# Patient Record
Sex: Male | Born: 1935 | Race: White | Hispanic: No | Smoking: Former smoker
Health system: Southern US, Community
[De-identification: ages and names within clinical notes are randomized; demographics above are authoritative.]

## PROBLEM LIST (undated history)

## (undated) DIAGNOSIS — E669 Obesity, unspecified: Secondary | ICD-10-CM

## (undated) DIAGNOSIS — K5730 Diverticulosis of large intestine without perforation or abscess without bleeding: Secondary | ICD-10-CM

## (undated) DIAGNOSIS — M1990 Unspecified osteoarthritis, unspecified site: Secondary | ICD-10-CM

## (undated) DIAGNOSIS — I639 Cerebral infarction, unspecified: Secondary | ICD-10-CM

## (undated) DIAGNOSIS — Z87442 Personal history of urinary calculi: Secondary | ICD-10-CM

## (undated) DIAGNOSIS — I119 Hypertensive heart disease without heart failure: Secondary | ICD-10-CM

## (undated) DIAGNOSIS — I6521 Occlusion and stenosis of right carotid artery: Secondary | ICD-10-CM

## (undated) DIAGNOSIS — I714 Abdominal aortic aneurysm, without rupture, unspecified: Secondary | ICD-10-CM

## (undated) DIAGNOSIS — E785 Hyperlipidemia, unspecified: Secondary | ICD-10-CM

## (undated) DIAGNOSIS — E119 Type 2 diabetes mellitus without complications: Secondary | ICD-10-CM

## (undated) DIAGNOSIS — Z8679 Personal history of other diseases of the circulatory system: Secondary | ICD-10-CM

## (undated) DIAGNOSIS — I6523 Occlusion and stenosis of bilateral carotid arteries: Secondary | ICD-10-CM

## (undated) DIAGNOSIS — I10 Essential (primary) hypertension: Secondary | ICD-10-CM

## (undated) DIAGNOSIS — I739 Peripheral vascular disease, unspecified: Secondary | ICD-10-CM

## (undated) DIAGNOSIS — N400 Enlarged prostate without lower urinary tract symptoms: Secondary | ICD-10-CM

## (undated) DIAGNOSIS — J309 Allergic rhinitis, unspecified: Secondary | ICD-10-CM

## (undated) DIAGNOSIS — I6522 Occlusion and stenosis of left carotid artery: Secondary | ICD-10-CM

## (undated) DIAGNOSIS — N471 Phimosis: Secondary | ICD-10-CM

## (undated) DIAGNOSIS — Z974 Presence of external hearing-aid: Secondary | ICD-10-CM

## (undated) HISTORY — DX: Cerebral infarction, unspecified: I63.9

## (undated) HISTORY — PX: CATARACT EXTRACTION W/ INTRAOCULAR LENS IMPLANT: SHX1309

## (undated) HISTORY — DX: Personal history of urinary calculi: Z87.442

## (undated) HISTORY — DX: Unspecified osteoarthritis, unspecified site: M19.90

## (undated) HISTORY — PX: CARDIOVASCULAR STRESS TEST: SHX262

## (undated) HISTORY — PX: EYE SURGERY: SHX253

## (undated) HISTORY — DX: Hypertensive heart disease without heart failure: I11.9

## (undated) HISTORY — DX: Occlusion and stenosis of bilateral carotid arteries: I65.23

## (undated) HISTORY — PX: TOTAL KNEE ARTHROPLASTY: SHX125

## (undated) HISTORY — DX: Obesity, unspecified: E66.9

## (undated) HISTORY — PX: JOINT REPLACEMENT: SHX530

---

## 2003-02-06 ENCOUNTER — Ambulatory Visit (HOSPITAL_COMMUNITY): Admission: RE | Admit: 2003-02-06 | Discharge: 2003-02-06 | Payer: Self-pay | Admitting: Internal Medicine

## 2003-02-08 HISTORY — PX: EXTRACORPOREAL SHOCK WAVE LITHOTRIPSY: SHX1557

## 2003-10-11 ENCOUNTER — Emergency Department (HOSPITAL_COMMUNITY): Admission: EM | Admit: 2003-10-11 | Discharge: 2003-10-11 | Payer: Self-pay | Admitting: Emergency Medicine

## 2003-10-16 ENCOUNTER — Ambulatory Visit (HOSPITAL_COMMUNITY): Admission: RE | Admit: 2003-10-16 | Discharge: 2003-10-16 | Payer: Self-pay | Admitting: Urology

## 2003-10-16 ENCOUNTER — Ambulatory Visit (HOSPITAL_BASED_OUTPATIENT_CLINIC_OR_DEPARTMENT_OTHER): Admission: RE | Admit: 2003-10-16 | Discharge: 2003-10-16 | Payer: Self-pay | Admitting: Urology

## 2003-10-16 HISTORY — PX: OTHER SURGICAL HISTORY: SHX169

## 2003-10-20 ENCOUNTER — Ambulatory Visit (HOSPITAL_COMMUNITY): Admission: RE | Admit: 2003-10-20 | Discharge: 2003-10-20 | Payer: Self-pay | Admitting: Urology

## 2004-02-25 ENCOUNTER — Ambulatory Visit: Payer: Self-pay | Admitting: Internal Medicine

## 2004-03-03 ENCOUNTER — Ambulatory Visit: Payer: Self-pay | Admitting: Internal Medicine

## 2004-04-23 ENCOUNTER — Ambulatory Visit: Payer: Self-pay | Admitting: Internal Medicine

## 2005-03-21 ENCOUNTER — Ambulatory Visit: Payer: Self-pay | Admitting: Internal Medicine

## 2005-03-29 ENCOUNTER — Ambulatory Visit: Payer: Self-pay | Admitting: Internal Medicine

## 2005-08-04 IMAGING — CR DG ABDOMEN 1V
2 series · 2 of 2 positions shown · non-contrast
Comparison: Unenhanced CT abdomen and pelvis 10/11/03.

CLINICAL DATA: right urinary tract calculus; pre-lithotripsy
ABDOMEN- AP VIEW 10/20/03

[view not recorded (1 of 2)]
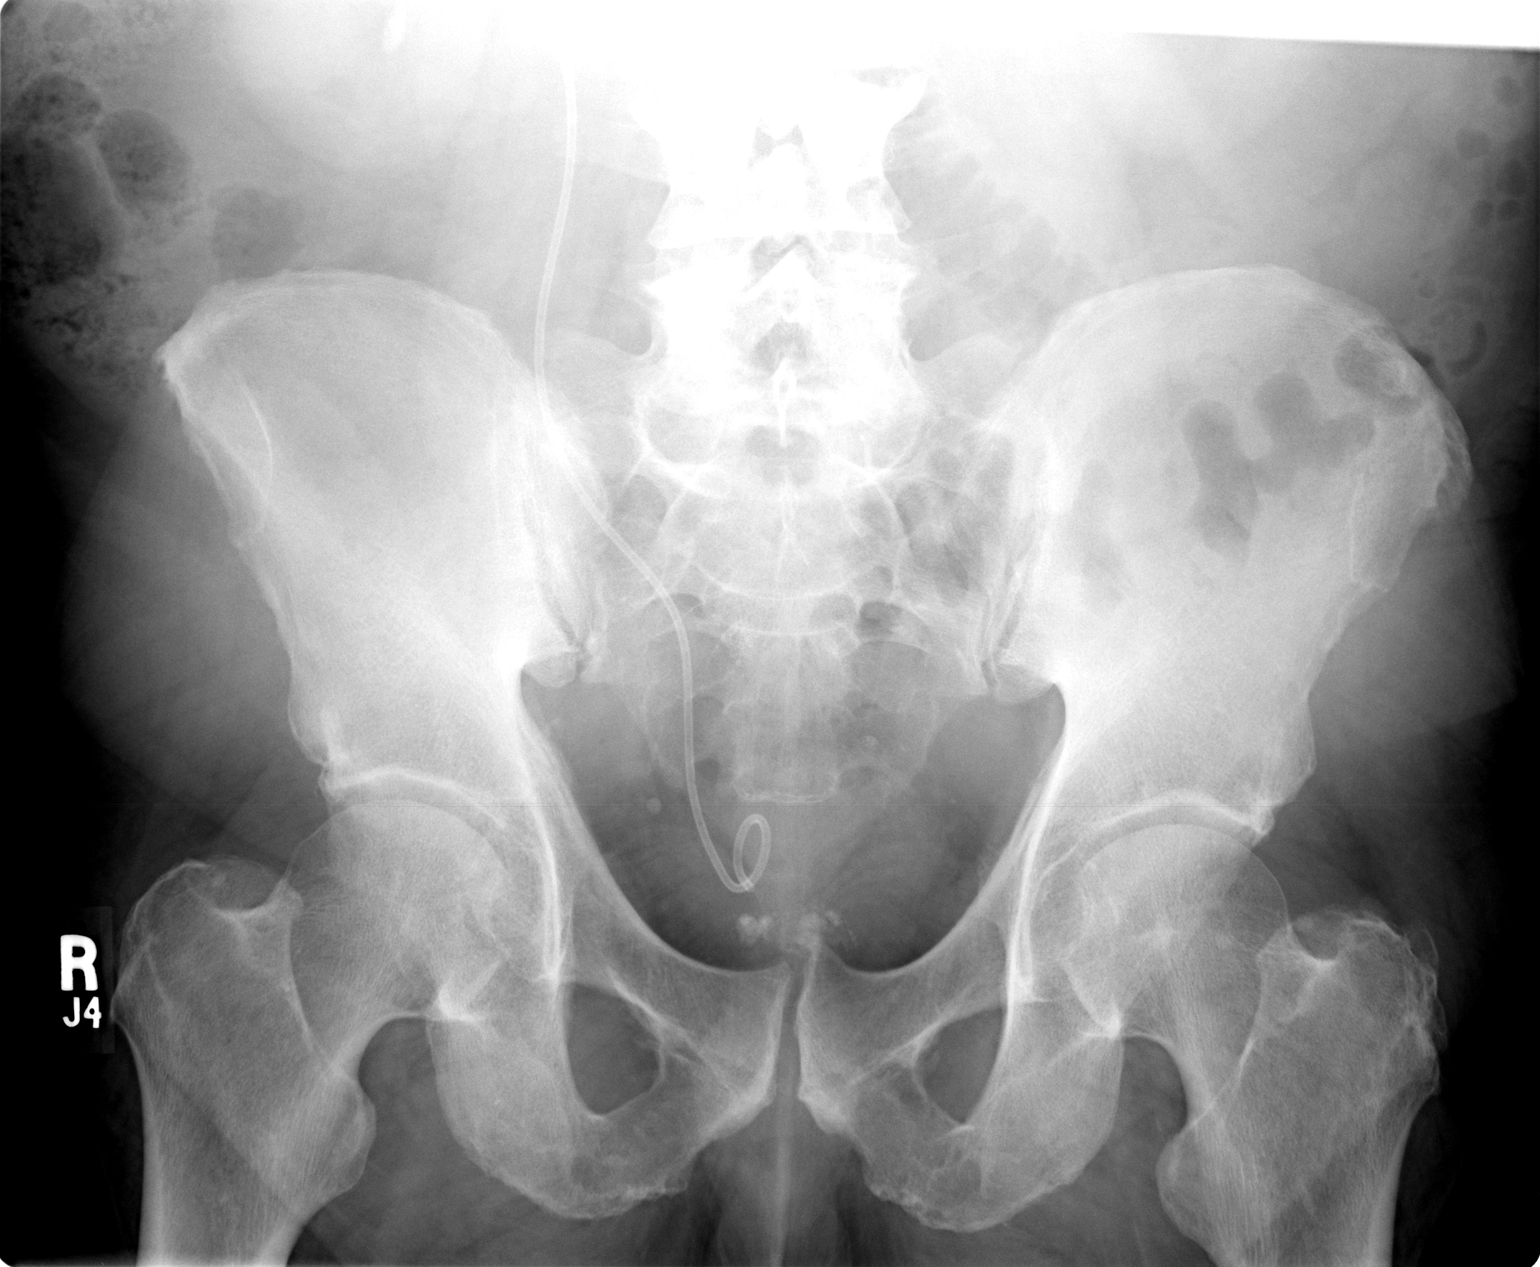

[view not recorded (2 of 2)]
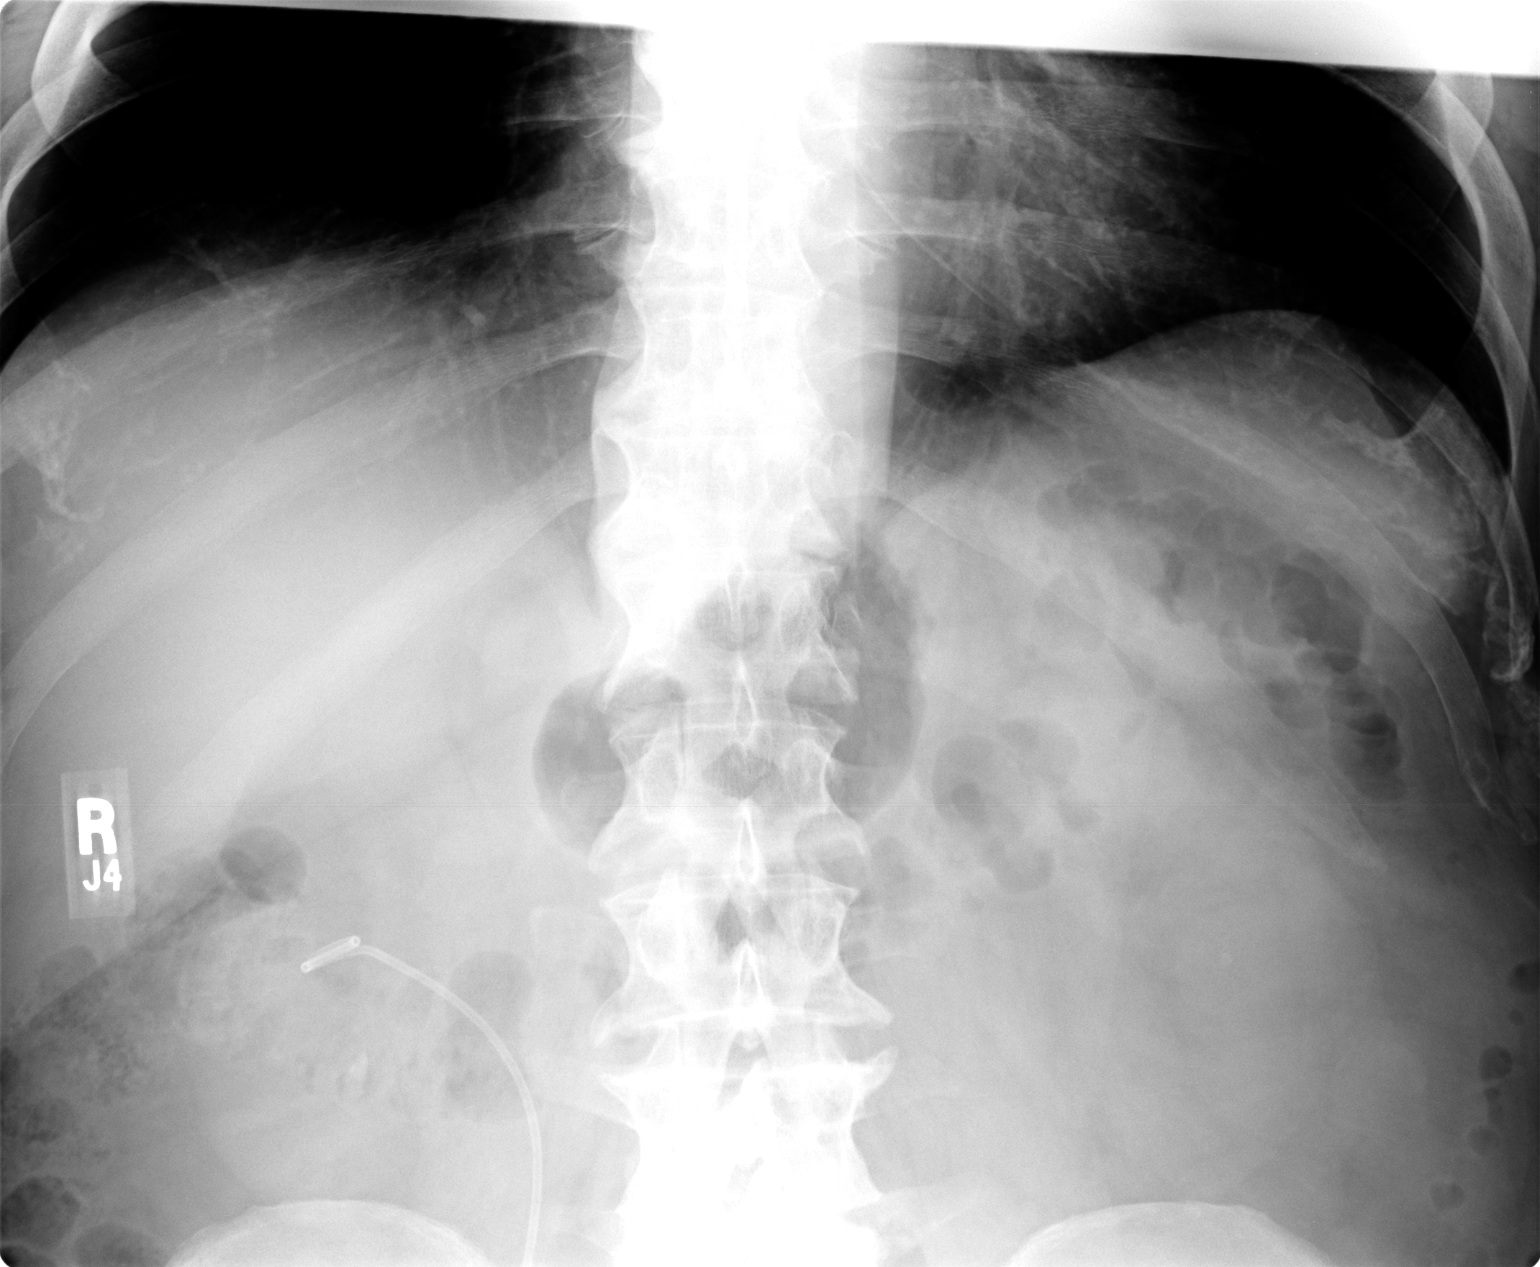

[2 of 2 positions shown; findings below may reference images not displayed]

FINDINGS: Since that examination, a right double-J ureteral stent has been placed.  Its proximal limb is in the expected location of a mid-calix of the right kidney and its distal limb is in the expected location of the bladder.  The UVJ calculus identified on the CT examination is now present in a lower pole calix of the right kidney.  No ureteral calculi are identified.  No prostatic calcifications are noted.  The bowel gas pattern is unremarkable.  
IMPRESSION
Right double-J ureteral stent in place.  The previously identified stone at the right UVJ is now present in a lower pole calix of the right kidney.

## 2006-07-17 ENCOUNTER — Ambulatory Visit: Payer: Self-pay | Admitting: Internal Medicine

## 2006-07-17 LAB — CONVERTED CEMR LAB
ALT: 28 units/L (ref 0–40)
Albumin: 4.2 g/dL (ref 3.5–5.2)
Alkaline Phosphatase: 52 units/L (ref 39–117)
BUN: 14 mg/dL (ref 6–23)
Bilirubin Urine: NEGATIVE
Bilirubin, Direct: 0.2 mg/dL (ref 0.0–0.3)
GFR calc Af Amer: 95 mL/min
HCT: 41.7 % (ref 39.0–52.0)
HDL: 47.2 mg/dL (ref >39.0)
Hemoglobin, Urine: NEGATIVE
Hgb A1c MFr Bld: 6.5 % — ABNORMAL HIGH (ref 4.6–6.0)
LDL Cholesterol: 93 mg/dL (ref 0–99)
Leukocytes, UA: NEGATIVE
MCHC: 35.4 g/dL (ref 30.0–36.0)
MCV: 92.7 fL (ref 78.0–100.0)
Microalb Creat Ratio: 5.4 mg/g (ref 0.0–30.0)
Microalb, Ur: 1.2 mg/dL (ref 0.0–1.9)
Monocytes Absolute: 0.3 10*3/uL (ref 0.2–0.7)
PSA: 1.38 ng/mL (ref 0.10–4.00)
Platelets: 143 10*3/uL — ABNORMAL LOW (ref 150–400)
RDW: 12.7 % (ref 11.5–14.6)
Sodium: 142 meq/L (ref 135–145)
Total Bilirubin: 1 mg/dL (ref 0.3–1.2)
Total CHOL/HDL Ratio: 3.3
Total Protein, Urine: NEGATIVE mg/dL
Total Protein: 7.1 g/dL (ref 6.0–8.3)
Urine Glucose: NEGATIVE mg/dL
Urobilinogen, UA: 0.2 (ref 0.0–1.0)
pH: 6 (ref 5.0–8.0)

## 2006-07-18 ENCOUNTER — Ambulatory Visit: Payer: Self-pay | Admitting: Vascular Surgery

## 2006-07-24 ENCOUNTER — Ambulatory Visit: Payer: Self-pay

## 2006-08-08 ENCOUNTER — Ambulatory Visit: Payer: Self-pay

## 2006-08-08 ENCOUNTER — Encounter: Payer: Self-pay | Admitting: Internal Medicine

## 2006-08-23 ENCOUNTER — Encounter: Admission: RE | Admit: 2006-08-23 | Discharge: 2006-08-23 | Payer: Self-pay | Admitting: Cardiology

## 2007-03-15 ENCOUNTER — Encounter: Payer: Self-pay | Admitting: Internal Medicine

## 2007-06-25 ENCOUNTER — Encounter: Payer: Self-pay | Admitting: Internal Medicine

## 2007-07-20 ENCOUNTER — Ambulatory Visit: Payer: Self-pay | Admitting: Internal Medicine

## 2007-07-20 DIAGNOSIS — K5730 Diverticulosis of large intestine without perforation or abscess without bleeding: Secondary | ICD-10-CM | POA: Insufficient documentation

## 2007-07-20 DIAGNOSIS — I739 Peripheral vascular disease, unspecified: Secondary | ICD-10-CM | POA: Insufficient documentation

## 2007-07-20 DIAGNOSIS — I119 Hypertensive heart disease without heart failure: Secondary | ICD-10-CM | POA: Insufficient documentation

## 2007-07-20 DIAGNOSIS — N400 Enlarged prostate without lower urinary tract symptoms: Secondary | ICD-10-CM | POA: Insufficient documentation

## 2007-07-20 DIAGNOSIS — E119 Type 2 diabetes mellitus without complications: Secondary | ICD-10-CM

## 2007-07-20 DIAGNOSIS — J309 Allergic rhinitis, unspecified: Secondary | ICD-10-CM | POA: Insufficient documentation

## 2007-07-20 DIAGNOSIS — F411 Generalized anxiety disorder: Secondary | ICD-10-CM | POA: Insufficient documentation

## 2007-07-20 DIAGNOSIS — F329 Major depressive disorder, single episode, unspecified: Secondary | ICD-10-CM | POA: Insufficient documentation

## 2007-07-20 DIAGNOSIS — M25569 Pain in unspecified knee: Secondary | ICD-10-CM | POA: Insufficient documentation

## 2007-07-20 DIAGNOSIS — Z87442 Personal history of urinary calculi: Secondary | ICD-10-CM | POA: Insufficient documentation

## 2007-07-20 DIAGNOSIS — F3289 Other specified depressive episodes: Secondary | ICD-10-CM | POA: Insufficient documentation

## 2007-07-20 DIAGNOSIS — E785 Hyperlipidemia, unspecified: Secondary | ICD-10-CM | POA: Insufficient documentation

## 2007-07-20 HISTORY — DX: Type 2 diabetes mellitus without complications: E11.9

## 2007-07-20 HISTORY — DX: Generalized anxiety disorder: F41.1

## 2007-09-20 ENCOUNTER — Encounter: Payer: Self-pay | Admitting: Internal Medicine

## 2007-09-26 ENCOUNTER — Encounter: Payer: Self-pay | Admitting: Internal Medicine

## 2007-09-26 ENCOUNTER — Telehealth (INDEPENDENT_AMBULATORY_CARE_PROVIDER_SITE_OTHER): Payer: Self-pay | Admitting: *Deleted

## 2007-10-08 ENCOUNTER — Inpatient Hospital Stay (HOSPITAL_COMMUNITY): Admission: RE | Admit: 2007-10-08 | Discharge: 2007-10-10 | Payer: Self-pay | Admitting: Orthopedic Surgery

## 2007-10-18 ENCOUNTER — Encounter: Payer: Self-pay | Admitting: Internal Medicine

## 2007-11-14 ENCOUNTER — Encounter: Payer: Self-pay | Admitting: Internal Medicine

## 2008-07-29 ENCOUNTER — Ambulatory Visit: Payer: Self-pay | Admitting: Vascular Surgery

## 2008-08-19 ENCOUNTER — Ambulatory Visit: Payer: Self-pay | Admitting: Internal Medicine

## 2008-08-20 LAB — CONVERTED CEMR LAB
Alkaline Phosphatase: 51 units/L (ref 39–117)
Basophils Relative: 0.6 % (ref 0.0–3.0)
Bilirubin Urine: NEGATIVE
Bilirubin, Direct: 0.2 mg/dL (ref 0.0–0.3)
Calcium: 9.3 mg/dL (ref 8.4–10.5)
Chloride: 105 meq/L (ref 96–112)
Cholesterol: 133 mg/dL (ref 0–200)
Glucose, Bld: 144 mg/dL — ABNORMAL HIGH (ref 70–99)
Hemoglobin, Urine: NEGATIVE
Leukocytes, UA: NEGATIVE
Lymphs Abs: 2 10*3/uL (ref 0.7–4.0)
PSA: 1.81 ng/mL (ref 0.10–4.00)
RDW: 13.4 % (ref 11.5–14.6)
Sodium: 143 meq/L (ref 135–145)
Specific Gravity, Urine: >=1.03 (ref 1.000–1.030)
Total CHOL/HDL Ratio: 3
Total Protein, Urine: NEGATIVE mg/dL
Total Protein: 7 g/dL (ref 6.0–8.3)
Triglycerides: 143 mg/dL (ref 0.0–149.0)
Urine Glucose: NEGATIVE mg/dL
Urobilinogen, UA: 0.2 (ref 0.0–1.0)
VLDL: 28.6 mg/dL (ref 0.0–40.0)
pH: 5.5 (ref 5.0–8.0)

## 2008-08-22 ENCOUNTER — Ambulatory Visit: Payer: Self-pay | Admitting: Internal Medicine

## 2008-08-22 DIAGNOSIS — R21 Rash and other nonspecific skin eruption: Secondary | ICD-10-CM | POA: Insufficient documentation

## 2008-09-09 ENCOUNTER — Encounter: Payer: Self-pay | Admitting: Internal Medicine

## 2008-09-15 ENCOUNTER — Encounter: Payer: Self-pay | Admitting: Internal Medicine

## 2008-09-30 ENCOUNTER — Inpatient Hospital Stay (HOSPITAL_COMMUNITY): Admission: RE | Admit: 2008-09-30 | Discharge: 2008-10-02 | Payer: Self-pay | Admitting: Orthopedic Surgery

## 2009-02-09 ENCOUNTER — Encounter: Payer: Self-pay | Admitting: Internal Medicine

## 2009-02-18 ENCOUNTER — Ambulatory Visit: Payer: Self-pay | Admitting: Internal Medicine

## 2009-02-23 ENCOUNTER — Ambulatory Visit: Payer: Self-pay | Admitting: Vascular Surgery

## 2009-06-15 ENCOUNTER — Encounter: Admission: RE | Admit: 2009-06-15 | Discharge: 2009-06-15 | Payer: Self-pay | Admitting: Internal Medicine

## 2009-06-17 ENCOUNTER — Ambulatory Visit: Payer: Self-pay | Admitting: Internal Medicine

## 2009-06-17 DIAGNOSIS — J019 Acute sinusitis, unspecified: Secondary | ICD-10-CM | POA: Insufficient documentation

## 2009-06-17 HISTORY — DX: Acute sinusitis, unspecified: J01.90

## 2009-06-23 ENCOUNTER — Encounter: Payer: Self-pay | Admitting: Internal Medicine

## 2009-08-20 ENCOUNTER — Telehealth: Payer: Self-pay | Admitting: Internal Medicine

## 2009-08-21 ENCOUNTER — Ambulatory Visit: Payer: Self-pay | Admitting: Internal Medicine

## 2009-08-21 LAB — CONVERTED CEMR LAB
ALT: 36 units/L (ref 0–53)
Albumin: 4.2 g/dL (ref 3.5–5.2)
Basophils Relative: 0.4 % (ref 0.0–3.0)
Bilirubin Urine: NEGATIVE
Cholesterol: 147 mg/dL (ref 0–200)
Creatinine,U: 117.2 mg/dL
Eosinophils Absolute: 0.3 10*3/uL (ref 0.0–0.7)
HCT: 41.7 % (ref 39.0–52.0)
Hemoglobin, Urine: NEGATIVE
Hemoglobin: 14.6 g/dL (ref 13.0–17.0)
Hgb A1c MFr Bld: 9.1 % — ABNORMAL HIGH (ref 4.6–6.5)
Leukocytes, UA: NEGATIVE
MCV: 94.4 fL (ref 78.0–100.0)
Microalb Creat Ratio: 0.3 mg/g (ref 0.0–30.0)
Monocytes Relative: 6.3 % (ref 3.0–12.0)
Neutro Abs: 3.8 10*3/uL (ref 1.4–7.7)
PSA: 1.62 ng/mL (ref 0.10–4.00)
RBC: 4.42 M/uL (ref 4.22–5.81)
TSH: 3.51 microintl units/mL (ref 0.35–5.50)
Total Bilirubin: 0.6 mg/dL (ref 0.3–1.2)
Total CHOL/HDL Ratio: 4
Triglycerides: 280 mg/dL — ABNORMAL HIGH (ref 0.0–149.0)
Urobilinogen, UA: 0.2 (ref 0.0–1.0)
WBC: 6.9 10*3/uL (ref 4.5–10.5)
pH: 6 (ref 5.0–8.0)

## 2009-08-24 ENCOUNTER — Ambulatory Visit: Payer: Self-pay | Admitting: Internal Medicine

## 2009-10-06 ENCOUNTER — Encounter: Payer: Self-pay | Admitting: Internal Medicine

## 2009-10-21 HISTORY — PX: TRANSTHORACIC ECHOCARDIOGRAM: SHX275

## 2009-10-26 ENCOUNTER — Telehealth: Payer: Self-pay | Admitting: Internal Medicine

## 2009-11-11 ENCOUNTER — Telehealth: Payer: Self-pay | Admitting: Internal Medicine

## 2010-01-08 ENCOUNTER — Telehealth: Payer: Self-pay | Admitting: Internal Medicine

## 2010-02-25 ENCOUNTER — Ambulatory Visit
Admission: RE | Admit: 2010-02-25 | Discharge: 2010-02-25 | Payer: Self-pay | Source: Home / Self Care | Attending: Internal Medicine | Admitting: Internal Medicine

## 2010-02-25 ENCOUNTER — Other Ambulatory Visit: Payer: Self-pay | Admitting: Internal Medicine

## 2010-02-25 LAB — LIPID PANEL
Cholesterol: 134 mg/dL (ref 0–200)
HDL: 37.7 mg/dL — ABNORMAL LOW (ref >39.00)
Total CHOL/HDL Ratio: 4
Triglycerides: 242 mg/dL — ABNORMAL HIGH (ref 0.0–149.0)
VLDL: 48.4 mg/dL — ABNORMAL HIGH (ref 0.0–40.0)

## 2010-02-25 LAB — LDL CHOLESTEROL, DIRECT: Direct LDL: 77.7 mg/dL

## 2010-02-25 LAB — BASIC METABOLIC PANEL
BUN: 22 mg/dL (ref 6–23)
CO2: 29 mEq/L (ref 19–32)
Calcium: 9 mg/dL (ref 8.4–10.5)
Chloride: 97 mEq/L (ref 96–112)
Creatinine, Ser: 1.1 mg/dL (ref 0.4–1.5)
GFR: 71.21 mL/min (ref >60.00)
Glucose, Bld: 216 mg/dL — ABNORMAL HIGH (ref 70–99)
Potassium: 4.6 mEq/L (ref 3.5–5.1)
Sodium: 135 mEq/L (ref 135–145)

## 2010-02-25 LAB — HEMOGLOBIN A1C: Hgb A1c MFr Bld: 8.3 % — ABNORMAL HIGH (ref 4.6–6.5)

## 2010-03-01 ENCOUNTER — Ambulatory Visit
Admission: RE | Admit: 2010-03-01 | Discharge: 2010-03-01 | Payer: Self-pay | Source: Home / Self Care | Attending: Internal Medicine | Admitting: Internal Medicine

## 2010-03-01 ENCOUNTER — Encounter: Payer: Self-pay | Admitting: Internal Medicine

## 2010-03-07 LAB — CONVERTED CEMR LAB
Albumin: 4.6 g/dL (ref 3.5–5.2)
BUN: 21 mg/dL (ref 6–23)
CO2: 26 meq/L (ref 19–32)
Creatinine, Ser: 1.1 mg/dL (ref 0.4–1.5)
Creatinine,U: 99 mg/dL
Direct LDL: 82.9 mg/dL
Eosinophils Absolute: 0.3 10*3/uL (ref 0.0–0.7)
GFR calc Af Amer: 85 mL/min
GFR calc non Af Amer: 70 mL/min
Glucose, Bld: 127 mg/dL — ABNORMAL HIGH (ref 70–99)
HDL: 38.6 mg/dL — ABNORMAL LOW (ref >39.0)
Hemoglobin, Urine: NEGATIVE
Hgb A1c MFr Bld: 6.9 % — ABNORMAL HIGH (ref 4.6–6.0)
Leukocytes, UA: NEGATIVE
Microalb, Ur: 0.2 mg/dL (ref 0.0–1.9)
Monocytes Absolute: 0.6 10*3/uL (ref 0.1–1.0)
Neutro Abs: 4.4 10*3/uL (ref 1.4–7.7)
Potassium: 4.2 meq/L (ref 3.5–5.1)
RBC: 4.46 M/uL (ref 4.22–5.81)
TSH: 1.88 microintl units/mL (ref 0.35–5.50)
Total CHOL/HDL Ratio: 3.8
Total Protein, Urine: NEGATIVE mg/dL
Total Protein: 7.5 g/dL (ref 6.0–8.3)
VLDL: 56 mg/dL — ABNORMAL HIGH (ref 0–40)

## 2010-03-09 ENCOUNTER — Ambulatory Visit: Admit: 2010-03-09 | Payer: Self-pay | Admitting: Vascular Surgery

## 2010-03-09 NOTE — Progress Notes (Signed)
  Phone Note Refill Request Message from:  Fax from Pharmacy on October 26, 2009 8:49 AM  Refills Requested: Medication #1:  FLUTICASONE PROPIONATE 50 MCG/ACT  SUSP 2 spray two times a day prn   Dosage confirmed as above?Dosage Confirmed   Last Refilled: 06/15/2009   Notes: [facility] Initial call taken by: Robin Ewing CMA (AAMA),  October 26, 2009 8:50 AM    Prescriptions: FLUTICASONE PROPIONATE 50 MCG/ACT  SUSP (FLUTICASONE PROPIONATE) 2 spray two times a day prn  #48 Gram x 3   Entered by:   Scharlene Gloss CMA (AAMA)   Authorized by:   Corwin Levins MD   Signed by:   Scharlene Gloss CMA (AAMA) on 10/26/2009   Method used:   Faxed to ...       [facility] (mail-order)             , [place]         Ph: [phone]       Fax: [phone]   RxID:   [id number]

## 2010-03-09 NOTE — Progress Notes (Signed)
Summary: metformin rx  Phone Note Call from Patient Call back at Home Phone [phone]   Caller: Patient Reason for Call: Refill Medication Summary of Call: Pt needs new rx for Metformin to be sent to [facility]-he doesn't remember what he did with the one given to him at his last OV(08/24/2009)-please advise Initial call taken by: Brenton Grills MA,  November 11, 2009 4:38 PM  Follow-up for Phone Call        to robin for routine Follow-up by: Corwin Levins MD,  November 11, 2009 5:45 PM    Prescriptions: METFORMIN HCL 500 MG  TABS (METFORMIN HCL) 1 by mouth two times a day  #180 x 3   Entered by:   Zella Ball Ewing CMA (AAMA)   Authorized by:   Corwin Levins MD   Signed by:   Scharlene Gloss CMA (AAMA) on 11/12/2009   Method used:   Faxed to ...       [facility] (mail-order)             , [place]         Ph: [phone]       Fax: [phone]   RxID:   [id number]

## 2010-03-09 NOTE — Assessment & Plan Note (Signed)
Summary: 6 MTH FU  $50--STC   Vital Signs:  Patient profile:   76 year old male Height:      71 inches Weight:      276 pounds BMI:     38.63 O2 Sat:      97 % on Room air Temp:     98.3 degrees F oral Pulse rate:   65 / minute BP sitting:   110 / 72  (left arm) Cuff size:   large  Vitals Entered ByMarland Kitchen Zella Ball Ewing (February 18, 2009 2:50 PM)  O2 Flow:  Room air  CC: 6 Mo ROV/RE   CC:  6 Mo ROV/RE.  History of Present Illness: wt up a few lbs with recent surgury to left knee and now pain free;  Pt denies CP, sob, doe, wheezing, orthopnea, pnd, worsening LE edema, palps, dizziness or syncope   Pt denies new neuro symptoms such as headache, facial or extremity weakness  Pt denies polydipsia, polyuria, or low sugar symptoms such as shakiness improved with eating.  Overall good compliance with meds, trying to follow low chol, DM diet, wt stable, little excercise however   Problems Prior to Update: 1)  Rash-nonvesicular  (ICD-782.1) 2)  Knee Pain, Right  (ICD-719.46) 3)  Preventive Health Care  (ICD-V70.0) 4)  Diverticulosis, Colon  (ICD-562.10) 5)  Depression  (ICD-311) 6)  Anxiety  (ICD-300.00) 7)  Nephrolithiasis, Hx of  (ICD-V13.01) 8)  Benign Prostatic Hypertrophy  (ICD-600.00) 9)  Hyperlipidemia  (ICD-272.4) 10)  Peripheral Vascular Disease  (ICD-443.9) 11)  Allergic Rhinitis  (ICD-477.9) 12)  Diabetes Mellitus, Type II  (ICD-250.00) 13)  Hypertension  (ICD-401.9)  Medications Prior to Update: 1)  Crestor 20 Mg Tabs (Rosuvastatin Calcium) .Marland Kitchen.. 1 By Mouth Once Daily 2)  Flomax 0.4 Mg Cp24 (Tamsulosin Hcl) .Marland Kitchen.. 1 By Mouth Once Daily 3)  Triamterene-Hctz 37.5-25 Mg  Tabs (Triamterene-Hctz) .Marland Kitchen.. 1 By Mouth Once Daily 4)  Cetirizine Hcl 10 Mg  Tabs (Cetirizine Hcl) .Marland Kitchen.. 1po Once Daily 5)  Carvedilol 12.5 Mg  Tabs (Carvedilol) .... 1/2 By Mouth Two Times A Day 6)  Glucosamine 1500 Complex   Caps (Glucosamine-Chondroit-Vit C-Mn) .... 4 By Mouth Qd 7)  Tylenol Ex St Arthritis  Pain 500 Mg  Tabs (Acetaminophen) .Marland Kitchen.. 1 By Mouth As Needed 8)  Darvocet-N 100 100-650 Mg  Tabs (Propoxyphene N-Apap) .Marland Kitchen.. 1 By Mouth Q 6 Hrs Prn 9)  Adult Aspirin Ec Low Strength 81 Mg  Tbec (Aspirin) .Marland Kitchen.. 1 By Mouth Once Daily 10)  Fluticasone Propionate 50 Mcg/act  Susp (Fluticasone Propionate) .... 2 Spray Two Times A Day Prn 11)  Metformin Hcl 500 Mg  Tabs (Metformin Hcl) .Marland Kitchen.. 1 By Mouth Once Daily 12)  Nystatin 100000 Unit/gm Crea (Nystatin) .... As Needed 13)  Lotrisone 1-0.05 % Crea (Clotrimazole-Betamethasone) .... Use Asd Two Times A Day As Needed 14)  Fluconazole 100 Mg Tabs (Fluconazole) .Marland Kitchen.. 1po Once Daily 15)  Benazepril Hcl 20 Mg Tabs (Benazepril Hcl) .Marland Kitchen.. 1po Once Daily 16)  Onetouch Ultra Test  Strp (Glucose Blood) .... Use Asd 1 Once Daily 17)  Viagra 100 Mg Tabs (Sildenafil Citrate) .Marland Kitchen.. 1 By Mouth Every Other Day As Needed  Current Medications (verified): 1)  Crestor 20 Mg Tabs (Rosuvastatin Calcium) .Marland Kitchen.. 1 By Mouth Once Daily 2)  Flomax 0.4 Mg Cp24 (Tamsulosin Hcl) .Marland Kitchen.. 1 By Mouth Once Daily 3)  Triamterene-Hctz 37.5-25 Mg  Tabs (Triamterene-Hctz) .Marland Kitchen.. 1 By Mouth Once Daily 4)  Cetirizine Hcl 10 Mg  Tabs (Cetirizine  Hcl) .... 1po Once Daily 5)  Carvedilol 12.5 Mg  Tabs (Carvedilol) .... 1/2 By Mouth Two Times A Day 6)  Glucosamine 1500 Complex   Caps (Glucosamine-Chondroit-Vit C-Mn) .... 4 By Mouth Qd 7)  Tylenol Ex St Arthritis Pain 500 Mg  Tabs (Acetaminophen) .Marland Kitchen.. 1 By Mouth As Needed 8)  Darvocet-N 100 100-650 Mg  Tabs (Propoxyphene N-Apap) .Marland Kitchen.. 1 By Mouth Q 6 Hrs Prn 9)  Adult Aspirin Ec Low Strength 81 Mg  Tbec (Aspirin) .Marland Kitchen.. 1 By Mouth Once Daily 10)  Fluticasone Propionate 50 Mcg/act  Susp (Fluticasone Propionate) .... 2 Spray Two Times A Day Prn 11)  Metformin Hcl 500 Mg  Tabs (Metformin Hcl) .Marland Kitchen.. 1 By Mouth Once Daily 12)  Nystatin 100000 Unit/gm Crea (Nystatin) .... As Needed 13)  Lotrisone 1-0.05 % Crea (Clotrimazole-Betamethasone) .... Use Asd Two Times A  Day As Needed 14)  Fluconazole 100 Mg Tabs (Fluconazole) .Marland Kitchen.. 1po Once Daily 15)  Benazepril Hcl 20 Mg Tabs (Benazepril Hcl) .Marland Kitchen.. 1po Once Daily 16)  Onetouch Ultra Test  Strp (Glucose Blood) .... Use Asd 1 Once Daily 17)  Viagra 100 Mg Tabs (Sildenafil Citrate) .Marland Kitchen.. 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   Head:  normocephalic and atraumatic.   Eyes:  vision grossly intact, pupils equal, and pupils round.   Ears:  R ear normal and L ear normal.   Nose:  no external deformity and no nasal discharge.   Mouth:  no gingival abnormalities and pharynx pink and moist.   Neck:  supple and no masses.   Lungs:  normal respiratory effort and normal breath sounds.   Heart:  normal rate and regular rhythm.   Extremities:  no edema, no erythema    Impression & Recommendations:  Problem # 1:  DIABETES MELLITUS, TYPE II (ICD-250.00)  His updated medication list for this problem includes:    Adult Aspirin Ec Low Strength 81 Mg Tbec (Aspirin) .Marland Kitchen... 1 by  mouth once daily    Metformin Hcl 500 Mg Tabs (Metformin hcl) .Marland Kitchen... 1 by mouth once daily    Benazepril Hcl 20 Mg Tabs (Benazepril hcl) .Marland Kitchen... 1po once daily  Labs Reviewed: Creat: 1.1 (08/19/2008)    Reviewed HgBA1c results: 6.5 (08/19/2008)  6.9 (07/20/2007) stable overall by hx and exam, ok to continue meds/tx as is, will need referral to DM clinic for diet and further wt loss  Orders: Diabetic Clinic Referral (Diabetic)  Problem # 2:  HYPERTENSION (ICD-401.9)  His updated medication list for this problem includes:    Triamterene-hctz 37.5-25 Mg Tabs (Triamterene-hctz) .Marland Kitchen... 1 by mouth once daily    Carvedilol 12.5 Mg  Tabs (Carvedilol) .Marland Kitchen... 1/2 by mouth two times a day    Benazepril Hcl 20 Mg Tabs (Benazepril hcl) .Marland Kitchen... 1po once daily  BP today: 110/72 Prior BP: 132/78 (08/22/2008)  Labs Reviewed: K+: 4.1 (08/19/2008) Creat: : 1.1 (08/19/2008)   Chol: 133 (08/19/2008)   HDL: 39.60 (08/19/2008)   LDL: 65 (08/19/2008)   TG: 143.0 (08/19/2008) stable overall by hx and exam, ok to continue meds/tx as is   Problem # 3:  HYPERLIPIDEMIA (ICD-272.4)  His updated medication list for this problem includes:    Crestor 20 Mg Tabs (Rosuvastatin calcium) .Marland Kitchen... 1 by mouth once daily  Labs Reviewed: SGOT: 35 (08/19/2008)   SGPT: 31 (08/19/2008)   HDL:39.60 (08/19/2008), 38.6 (07/20/2007)  LDL:65 (08/19/2008), DEL (16/11/9602)  Chol:133 (08/19/2008), 146 (07/20/2007)  Trig:143.0 (08/19/2008), 281 (07/20/2007) stable overall by hx and exam, ok to continue meds/tx as is , Pt to continue diet efforts, good med tolerance; to check labs - goal LDL less than 70   Complete Medication List: 1)  Crestor 20 Mg Tabs (Rosuvastatin calcium) .Marland Kitchen.. 1 by mouth once daily 2)  Flomax 0.4 Mg Cp24 (Tamsulosin hcl) .Marland Kitchen.. 1 by mouth once daily 3)  Triamterene-hctz 37.5-25 Mg Tabs (Triamterene-hctz) .Marland Kitchen.. 1 by mouth once daily 4)  Cetirizine Hcl 10 Mg Tabs (Cetirizine hcl) .Marland Kitchen.. 1po once daily 5)  Carvedilol 12.5  Mg Tabs (Carvedilol) .... 1/2 by mouth two times a day 6)  Glucosamine 1500 Complex Caps (Glucosamine-chondroit-vit c-mn) .... 4 by mouth qd 7)  Tylenol Ex St Arthritis Pain 500 Mg Tabs (Acetaminophen) .Marland Kitchen.. 1 by mouth as needed 8)  Darvocet-n 100 100-650 Mg Tabs (Propoxyphene n-apap) .Marland Kitchen.. 1 by mouth q 6 hrs prn 9)  Adult Aspirin Ec Low Strength 81 Mg Tbec (Aspirin) .Marland Kitchen.. 1 by mouth once daily 10)  Fluticasone Propionate 50 Mcg/act Susp (Fluticasone propionate) .... 2 spray two times a day prn 11)  Metformin Hcl 500 Mg Tabs (Metformin hcl) .Marland Kitchen.. 1 by mouth once daily 12)  Nystatin 100000 Unit/gm Crea (Nystatin) .... As needed 13)  Lotrisone 1-0.05 % Crea (Clotrimazole-betamethasone) .... Use asd two times a day as needed 14)  Fluconazole 100 Mg Tabs (Fluconazole) .Marland Kitchen.. 1po once daily 15)  Benazepril Hcl 20 Mg Tabs (Benazepril hcl) .Marland Kitchen.. 1po once daily 16)  Onetouch Ultra Test Strp (Glucose blood) .... Use asd 1 once daily 17)  Viagra 100 Mg Tabs (Sildenafil citrate) .Marland Kitchen.. 1 by mouth every other day as needed  Patient Instructions: 1)  Continue all previous medications as before this visit  2)  Please schedule a follow-up appointment in 6 months with CPX labs and: 3)  HbgA1C prior to visit, ICD-9: 250.02 4)  Urine Microalbumin prior to visit, ICD-9:   Immunization History:  Influenza Immunization History:    Influenza:  historical (12/08/2008)

## 2010-03-09 NOTE — Progress Notes (Signed)
  Phone Note Refill Request Message from:  Fax from Pharmacy on August 20, 2009 11:08 AM  Refills Requested: Medication #1:  FLOMAX 0.4 MG CP24 1 by mouth once daily   Dosage confirmed as above?Dosage Confirmed   Last Refilled: 08/22/2008   Notes: [facility] Initial call taken by: Robin Ewing CMA (AAMA),  August 20, 2009 11:08 AM    Prescriptions: FLOMAX 0.4 MG CP24 (TAMSULOSIN HCL) 1 by mouth once daily  #90 x 3   Entered by:   Scharlene Gloss CMA (AAMA)   Authorized by:   Corwin Levins MD   Signed by:   Scharlene Gloss CMA (AAMA) on 08/20/2009   Method used:   Faxed to ...       [facility] (mail[facility]             , [place]         Ph: [phone]       Fax: [phone]   RxID:   [id number]

## 2010-03-09 NOTE — Letter (Signed)
Summary: [facility]  [facility]   Imported By: Lanelle Bal 02/12/2009 13:54:15  _____________________________________________________________________  External Attachment:    Type:   Image     Comment:   External Document

## 2010-03-09 NOTE — Letter (Signed)
Summary: Darden Palmer MD  W Ashley Royalty MD   Imported By: Lester Bremer 10/16/2009 60:45:40  _____________________________________________________________________  External Attachment:    Type:   Image     Comment:   External Document

## 2010-03-09 NOTE — Progress Notes (Signed)
Summary: medication clarification  Phone Note From Pharmacy   Caller: [facility] Summary of Call: Pharmacy requesting to clarify if pts. Januvia dosage should be reduced since aging may reduce renal function . Please advise Initial call taken by: Robin Ewing CMA Duncan Dull),  January 08, 2010 8:36 AM  Follow-up for Phone Call        ok to cont as is for now; last GFR 75 Follow-up by: Corwin Levins MD,  January 08, 2010 9:32 AM  Additional Follow-up for Phone Call Additional follow up Details #1::        called [facility] informed of above information. Additional Follow-up by: Robin Ewing CMA Duncan Dull),  January 08, 2010 10:34 AM

## 2010-03-09 NOTE — Assessment & Plan Note (Signed)
Summary: YEARLY FU/ [employer] MEDICARE/ LABS PRIOR/ NWS  #   Vital Signs:  Patient profile:   76 year old male Height:      71 inches Weight:      275 pounds BMI:     38.49 O2 Sat:      96 % on Room air Temp:     98.7 degrees F oral Pulse rate:   57 / minute BP sitting:   112 / 78  (left arm) Cuff size:   large  Vitals Entered By: Zella Ball Ewing CMA Duncan Dull) (August 24, 2009 10:55 AM)  O2 Flow:  Room air  CC: Yearly followup/RE   CC:  Yearly followup/RE.  History of Present Illness: here to f/u;  cbg's at home in the 200's despite good med compliance, no wt or diet change per pt;  has been less active overall, did attend the [employer] class, but not monitoroing the sugars as much lately.  Did have left knee suguryg uag 2010 - stil trying to get back to being as active.  Pt denies CP, sob, doe, wheezing, orthopnea, pnd, worsening LE edema, palps, dizziness or syncope  Pt denies new neuro symptoms such as headache, facial or extremity weakness   Pt denies polydipsia, polyuria, or low sugar symptoms such as shakiness improved with eating.  Overall good compliance with meds, trying to follow low chol, DM diet, wt stable, little excercise however   No fever, wt loss, night sweats, loss of appetite or other constitutional symptoms   Problems Prior to Update: 1)  Sinusitis- Acute-nos  (ICD-461.9) 2)  Rash-nonvesicular  (ICD-782.1) 3)  Knee Pain, Right  (ICD-719.46) 4)  Preventive Health Care  (ICD-V70.0) 5)  Diverticulosis, Colon  (ICD-562.10) 6)  Depression  (ICD-311) 7)  Anxiety  (ICD-300.00) 8)  Nephrolithiasis, Hx of  (ICD-V13.01) 9)  Benign Prostatic Hypertrophy  (ICD-600.00) 10)  Hyperlipidemia  (ICD-272.4) 11)  Peripheral Vascular Disease  (ICD-443.9) 12)  Allergic Rhinitis  (ICD-477.9) 13)  Diabetes Mellitus, Type II  (ICD-250.00) 14)  Hypertension  (ICD-401.9)  Medications Prior to Update: 1)  Crestor 20 Mg Tabs (Rosuvastatin Calcium) .Marland Kitchen.. 1 By Mouth Once Daily 2)  Flomax 0.4 Mg Cp24  (Tamsulosin Hcl) .Marland Kitchen.. 1 By Mouth Once Daily 3)  Triamterene-Hctz 37.5-25 Mg  Tabs (Triamterene-Hctz) .Marland Kitchen.. 1 By Mouth Once Daily 4)  Cetirizine Hcl 10 Mg  Tabs (Cetirizine Hcl) .Marland Kitchen.. 1po Once Daily 5)  Carvedilol 12.5 Mg  Tabs (Carvedilol) .... 1/2 By Mouth Two Times A Day 6)  Glucosamine 1500 Complex   Caps (Glucosamine-Chondroit-Vit C-Mn) .... 4 By Mouth Qd 7)  Tylenol Ex St Arthritis Pain 500 Mg  Tabs (Acetaminophen) .Marland Kitchen.. 1 By Mouth As Needed 8)  Darvocet-N 100 100-650 Mg  Tabs (Propoxyphene N-Apap) .Marland Kitchen.. 1 By Mouth Q 6 Hrs Prn 9)  Adult Aspirin Ec Low Strength 81 Mg  Tbec (Aspirin) .Marland Kitchen.. 1 By Mouth Once Daily 10)  Fluticasone Propionate 50 Mcg/act  Susp (Fluticasone Propionate) .... 2 Spray Two Times A Day Prn 11)  Metformin Hcl 500 Mg  Tabs (Metformin Hcl) .Marland Kitchen.. 1 By Mouth Once Daily 12)  Nystatin 100000 Unit/gm Crea (Nystatin) .... As Needed 13)  Lotrisone 1-0.05 % Crea (Clotrimazole-Betamethasone) .... Use Asd Two Times A Day As Needed 14)  Fluconazole 100 Mg Tabs (Fluconazole) .Marland Kitchen.. 1po Once Daily 15)  Benazepril Hcl 20 Mg Tabs (Benazepril Hcl) .Marland Kitchen.. 1po Once Daily 16)  Onetouch Ultra Test  Strp (Glucose Blood) .... Use Asd 1 Once Daily 17)  Viagra  100 Mg Tabs (Sildenafil Citrate) .Marland Kitchen.. 1 By Mouth Every Other Day As Needed 18)  Azithromycin 250 Mg Tabs (Azithromycin) .... 2po Qd For 1 Day, Then 1po Qd For 4days, Then Stop  Current Medications (verified): 1)  Crestor 20 Mg Tabs (Rosuvastatin Calcium) .Marland Kitchen.. 1 By Mouth Once Daily 2)  Flomax 0.4 Mg Cp24 (Tamsulosin Hcl) .Marland Kitchen.. 1 By Mouth Once Daily 3)  Triamterene-Hctz 37.5-25 Mg  Tabs (Triamterene-Hctz) .Marland Kitchen.. 1 By Mouth Once Daily 4)  Cetirizine Hcl 10 Mg  Tabs (Cetirizine Hcl) .Marland Kitchen.. 1po Once Daily 5)  Carvedilol 12.5 Mg  Tabs (Carvedilol) .... 1/2 By Mouth Two Times A Day 6)  Glucosamine 1500 Complex   Caps (Glucosamine-Chondroit-Vit C-Mn) .... 4 By Mouth Qd 7)  Tylenol Ex St Arthritis Pain 500 Mg  Tabs (Acetaminophen) .Marland Kitchen.. 1 By Mouth As Needed 8)   Darvocet-N 100 100-650 Mg  Tabs (Propoxyphene N-Apap) .Marland Kitchen.. 1 By Mouth Q 6 Hrs Prn 9)  Adult Aspirin Ec Low Strength 81 Mg  Tbec (Aspirin) .Marland Kitchen.. 1 By Mouth Once Daily 10)  Fluticasone Propionate 50 Mcg/act  Susp (Fluticasone Propionate) .... 2 Spray Two Times A Day Prn 11)  Metformin Hcl 500 Mg  Tabs (Metformin Hcl) .Marland Kitchen.. 1 By Mouth Two Times A Day 12)  Nystatin 100000 Unit/gm Crea (Nystatin) .... As Needed 13)  Lotrisone 1-0.05 % Crea (Clotrimazole-Betamethasone) .... Use Asd Two Times A Day As Needed 14)  Fluconazole 100 Mg Tabs (Fluconazole) .Marland Kitchen.. 1po Once Daily 15)  Benazepril Hcl 20 Mg Tabs (Benazepril Hcl) .Marland Kitchen.. 1po Once Daily 16)  Onetouch Ultra Test  Strp (Glucose Blood) .... Use Asd 1 Once Daily 17)  Viagra 100 Mg Tabs (Sildenafil Citrate) .Marland Kitchen.. 1 By Mouth Every Other Day As Needed 18)  Azithromycin 250 Mg Tabs (Azithromycin) .... 2po Qd For 1 Day, Then 1po Qd For 4days, Then Stop 19)  Centrum Silver  Tabs (Multiple Vitamins-Minerals) .Marland Kitchen.. 1 By Mouth Once Daily 20)  Januvia 100 Mg Tabs (Sitagliptin Phosphate) .Marland Kitchen.. 1po Once Daily  Allergies (verified): No Known Drug Allergies  Past History:  Past Medical History: Last updated: 08/22/2008 Hypertension Diabetes mellitus, type II Allergic rhinitis Peripheral vascular disease - small AAA, and right ICA 100%  Hyperlipidemia Benign prostatic hypertrophy - dr Annabell Howells Nephrolithiasis, hx of hx of ETOH (remote) Anxiety Depression hx of right central retinal artery emboli DJD - hands, left shoulder, ankles Diverticulosis, colon Hypertensive Cardiomyopathy/ no CHF  Past Surgical History: Last updated: 02/18/2009 Denies surgical history a/p lithotripsy s/p bilat knee replacements - last was left knee aug 2010 - dr Charlann Boxer  Family History: Last updated: 07/20/2007 mother with CHF, COPD, renal failure HTN asthma depression  Social History: Last updated: 07/20/2007 Never Smoked 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         all otherwise negative per pt -    Physical Exam  General:  alert and overweight-appearing.   Head:  normocephalic and atraumatic.   Eyes:  vision grossly intact, pupils equal, and pupils round.   Ears:  R ear normal and L ear normal.   Nose:  no external deformity and no nasal discharge.   Mouth:  no gingival abnormalities and pharynx pink and moist.   Neck:  supple and no masses.   Lungs:  normal respiratory effort.   Heart:  normal rate and regular rhythm.   Abdomen:  soft, non-tender, and normal bowel sounds.   Msk:  no joint tenderness and no joint swelling.  , s/p bilat knee replacements with mild warmth but noeffusoin on the left Extremities:  tace ankle edema bilat Neurologic:  cranial nerves II-XII intact and strength normal in all extremities.  , has marked bilat hearing loss Skin:  color normal and no rashes.   Psych:  not anxious appearing and not depressed appearing.     Impression & Recommendations:  Problem # 1:  Preventive Health Care (ICD-V70.0)  Overall doing well, age appropriate education and counseling updated and referral for appropriate preventive services done unless declined, immunizations up to date or declined, diet counseling done if overweight, urged to quit smoking if smokes , most recent labs reviewed and current ordered if appropriate, ecg reviewed or declined (interpretation per ECG  scanned in the EMR if done); information regarding Medicare Prevention requirements given if appropriate; speciality referrals updated as appropriate   Orders: EKG w/ Interpretation (93000)  Problem # 2:  DIABETES MELLITUS, TYPE II (ICD-250.00)  His updated medication list for this problem includes:    Adult Aspirin Ec Low Strength 81 Mg Tbec (Aspirin) .Marland Kitchen... 1 by mouth once daily    Metformin Hcl 500 Mg Tabs (Metformin hcl) .Marland Kitchen... 1 by mouth two times a day    Benazepril Hcl 20 Mg Tabs (Benazepril hcl) .Marland Kitchen... 1po once daily    Januvia 100 Mg Tabs (Sitagliptin phosphate) .Marland Kitchen... 1po once daily uncontrolled - to increase the metformin to two times a day, and add januvia 100   Labs Reviewed: Creat: 1.1 (08/21/2009)    Reviewed HgBA1c results: 9.1 (08/21/2009)  6.5 (08/19/2008)  Complete Medication List: 1)  Crestor 20 Mg Tabs (Rosuvastatin calcium) .Marland Kitchen.. 1 by mouth once daily 2)  Flomax 0.4 Mg Cp24 (Tamsulosin hcl) .Marland Kitchen.. 1 by mouth once daily 3)  Triamterene-hctz 37.5-25 Mg Tabs (Triamterene-hctz) .Marland Kitchen.. 1 by mouth once daily 4)  Cetirizine Hcl 10 Mg Tabs (Cetirizine hcl) .Marland Kitchen.. 1po once daily 5)  Carvedilol 12.5 Mg Tabs (Carvedilol) .... 1/2 by mouth two times a day 6)  Glucosamine 1500 Complex Caps (Glucosamine-chondroit-vit c-mn) .... 4 by mouth qd 7)  Tylenol Ex St Arthritis Pain 500 Mg Tabs (Acetaminophen) .Marland Kitchen.. 1 by mouth as needed 8)  Darvocet-n 100 100-650 Mg Tabs (Propoxyphene n-apap) .Marland Kitchen.. 1 by mouth q 6 hrs prn 9)  Adult Aspirin Ec Low Strength 81 Mg Tbec (Aspirin) .Marland Kitchen.. 1 by mouth once daily 10)  Fluticasone Propionate 50 Mcg/act Susp (Fluticasone propionate) .... 2 spray two times a day prn 11)  Metformin Hcl 500 Mg Tabs (Metformin hcl) .Marland Kitchen.. 1 by mouth two times a day 12)  Nystatin 100000 Unit/gm Crea (Nystatin) .... As needed 13)  Lotrisone 1-0.05 % Crea (Clotrimazole-betamethasone) .... Use asd two times a day as needed 14)  Fluconazole 100 Mg Tabs (Fluconazole) .Marland Kitchen.. 1po once  daily 15)  Benazepril Hcl 20 Mg Tabs (Benazepril hcl) .Marland Kitchen.. 1po once daily 16)  Onetouch Ultra Test Strp (Glucose blood) .... Use asd 1 once daily 17)  Viagra 100 Mg Tabs (Sildenafil citrate) .Marland Kitchen.. 1 by mouth every other day as needed 18)  Azithromycin 250 Mg Tabs (Azithromycin) .... 2po qd for 1 day, then 1po  qd for 4days, then stop 19)  Centrum Silver Tabs (Multiple vitamins-minerals) .Marland Kitchen.. 1 by mouth once daily 20)  Januvia 100 Mg Tabs (Sitagliptin phosphate) .Marland Kitchen.. 1po once daily  Patient Instructions: 1)  increase the metformin to two times a day  2)  start the januvia 100 mg per day 3)  Continue all previous medications as before this visit  4)  Please schedule a follow-up appointment in 6 months with : 5)  BMP prior to visit, ICD-9: 250.02 6)  Lipid Panel prior to visit, ICD-9: 7)  HbgA1C prior to visit, ICD-9: Prescriptions: METFORMIN HCL 500 MG  TABS (METFORMIN HCL) 1 by mouth two times a day  #180 x 3   Entered and Authorized by:   Corwin Levins MD   Signed by:   Corwin Levins MD on 08/24/2009   Method used:   Print then Give to Patient   RxID:   [id number] JANUVIA 100 MG TABS (SITAGLIPTIN PHOSPHATE) 1po once daily  #30 x 11   Entered and Authorized by:   Corwin Levins MD   Signed by:   Corwin Levins MD on 08/24/2009   Method used:   Print then Give to Patient   RxID:   [id number]

## 2010-03-09 NOTE — Assessment & Plan Note (Signed)
Summary: SINUS/NWS   Vital Signs:  Patient profile:   76 year old male Height:      71 inches Weight:      276 pounds BMI:     38.63 O2 Sat:      95 % on Room air Temp:     98.1 degrees F oral Pulse rate:   56 / minute BP sitting:   112 / 78  (left arm) Cuff size:   large  Vitals Entered By: Bill Salinas CMA (Jun 17, 2009 11:40 AM)  O2 Flow:  Room air  CC: office visit for evaluation of sinus'/ ab   CC:  office visit for evaluation of sinus'/ ab.  History of Present Illness: here wtih 3 wks ongoing and now moderate to severe facial pain, pressure, fever , and greenish d/c ;  Pt denies CP, sob, doe, wheezing, orthopnea, pnd, worsening LE edema, palps, dizziness or syncope  Pt denies new neuro symptoms such as headache, facial or extremity weakness   Overall good complaicne wtih meds, good tolerance.  Last sinus infection remotely.  Has mild ongoing allergy nasal symptoms as well.  Pt denies polydipsia, polyuria, or low sugar symptoms such as shakiness improved with eating.  Overall good compliance with meds, trying to follow low chol, DM diet, wt stable, little excercise however   Problems Prior to Update: 1)  Sinusitis- Acute-nos  (ICD-461.9) 2)  Rash-nonvesicular  (ICD-782.1) 3)  Knee Pain, Right  (ICD-719.46) 4)  Preventive Health Care  (ICD-V70.0) 5)  Diverticulosis, Colon  (ICD-562.10) 6)  Depression  (ICD-311) 7)  Anxiety  (ICD-300.00) 8)  Nephrolithiasis, Hx of  (ICD-V13.01) 9)  Benign Prostatic Hypertrophy  (ICD-600.00) 10)  Hyperlipidemia  (ICD-272.4) 11)  Peripheral Vascular Disease  (ICD-443.9) 12)  Allergic Rhinitis  (ICD-477.9) 13)  Diabetes Mellitus, Type II  (ICD-250.00) 14)  Hypertension  (ICD-401.9)  Medications Prior to Update: 1)  Crestor 20 Mg Tabs (Rosuvastatin Calcium) .Marland Kitchen.. 1 By Mouth Once Daily 2)  Flomax 0.4 Mg Cp24 (Tamsulosin Hcl) .Marland Kitchen.. 1 By Mouth Once Daily 3)  Triamterene-Hctz 37.5-25 Mg  Tabs (Triamterene-Hctz) .Marland Kitchen.. 1 By Mouth Once Daily 4)   Cetirizine Hcl 10 Mg  Tabs (Cetirizine Hcl) .Marland Kitchen.. 1po Once Daily 5)  Carvedilol 12.5 Mg  Tabs (Carvedilol) .... 1/2 By Mouth Two Times A Day 6)  Glucosamine 1500 Complex   Caps (Glucosamine-Chondroit-Vit C-Mn) .... 4 By Mouth Qd 7)  Tylenol Ex St Arthritis Pain 500 Mg  Tabs (Acetaminophen) .Marland Kitchen.. 1 By Mouth As Needed 8)  Darvocet-N 100 100-650 Mg  Tabs (Propoxyphene N-Apap) .Marland Kitchen.. 1 By Mouth Q 6 Hrs Prn 9)  Adult Aspirin Ec Low Strength 81 Mg  Tbec (Aspirin) .Marland Kitchen.. 1 By Mouth Once Daily 10)  Fluticasone Propionate 50 Mcg/act  Susp (Fluticasone Propionate) .... 2 Spray Two Times A Day Prn 11)  Metformin Hcl 500 Mg  Tabs (Metformin Hcl) .Marland Kitchen.. 1 By Mouth Once Daily 12)  Nystatin 100000 Unit/gm Crea (Nystatin) .... As Needed 13)  Lotrisone 1-0.05 % Crea (Clotrimazole-Betamethasone) .... Use Asd Two Times A Day As Needed 14)  Fluconazole 100 Mg Tabs (Fluconazole) .Marland Kitchen.. 1po Once Daily 15)  Benazepril Hcl 20 Mg Tabs (Benazepril Hcl) .Marland Kitchen.. 1po Once Daily 16)  Onetouch Ultra Test  Strp (Glucose Blood) .... Use Asd 1 Once Daily 17)  Viagra 100 Mg Tabs (Sildenafil Citrate) .Marland Kitchen.. 1 By Mouth Every Other Day As Needed  Current Medications (verified): 1)  Crestor 20 Mg Tabs (Rosuvastatin Calcium) .Marland Kitchen.. 1 By Mouth Once Daily  2)  Flomax 0.4 Mg Cp24 (Tamsulosin Hcl) .Marland Kitchen.. 1 By Mouth Once Daily 3)  Triamterene-Hctz 37.5-25 Mg  Tabs (Triamterene-Hctz) .Marland Kitchen.. 1 By Mouth Once Daily 4)  Cetirizine Hcl 10 Mg  Tabs (Cetirizine Hcl) .Marland Kitchen.. 1po Once Daily 5)  Carvedilol 12.5 Mg  Tabs (Carvedilol) .... 1/2 By Mouth Two Times A Day 6)  Glucosamine 1500 Complex   Caps (Glucosamine-Chondroit-Vit C-Mn) .... 4 By Mouth Qd 7)  Tylenol Ex St Arthritis Pain 500 Mg  Tabs (Acetaminophen) .Marland Kitchen.. 1 By Mouth As Needed 8)  Darvocet-N 100 100-650 Mg  Tabs (Propoxyphene N-Apap) .Marland Kitchen.. 1 By Mouth Q 6 Hrs Prn 9)  Adult Aspirin Ec Low Strength 81 Mg  Tbec (Aspirin) .Marland Kitchen.. 1 By Mouth Once Daily 10)  Fluticasone Propionate 50 Mcg/act  Susp (Fluticasone  Propionate) .... 2 Spray Two Times A Day Prn 11)  Metformin Hcl 500 Mg  Tabs (Metformin Hcl) .Marland Kitchen.. 1 By Mouth Once Daily 12)  Nystatin 100000 Unit/gm Crea (Nystatin) .... As Needed 13)  Lotrisone 1-0.05 % Crea (Clotrimazole-Betamethasone) .... Use Asd Two Times A Day As Needed 14)  Fluconazole 100 Mg Tabs (Fluconazole) .Marland Kitchen.. 1po Once Daily 15)  Benazepril Hcl 20 Mg Tabs (Benazepril Hcl) .Marland Kitchen.. 1po Once Daily 16)  Onetouch Ultra Test  Strp (Glucose Blood) .... Use Asd 1 Once Daily 17)  Viagra 100 Mg Tabs (Sildenafil Citrate) .Marland Kitchen.. 1 By Mouth Every Other Day As Needed 18)  Azithromycin 250 Mg Tabs (Azithromycin) .... 2po Qd For 1 Day, Then 1po Qd For 4days, Then Stop  Allergies (verified): No Known Drug Allergies  Past History:  Past Medical History: Last updated: 08/22/2008 Hypertension Diabetes mellitus, type II Allergic rhinitis Peripheral vascular disease - small AAA, and right ICA 100%  Hyperlipidemia Benign prostatic hypertrophy - dr Annabell Howells Nephrolithiasis, hx of hx of ETOH (remote) Anxiety Depression hx of right central retinal artery emboli DJD - hands, left shoulder, ankles Diverticulosis, colon Hypertensive Cardiomyopathy/ no CHF  Past Surgical History: Last updated: 02/18/2009 Denies surgical history a/p lithotripsy s/p bilat knee replacements - last was left knee aug 2010 - dr Charlann Boxer  Social History: Last updated: 07/20/2007 Never Smoked 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   Head:  normocephalic and atraumatic.   Eyes:  vision grossly intact, pupils equal, and pupils round.   Ears:  bilat tm's red, sinus tender bilat Nose:  nasal dischargemucosal pallor and mucosal edema.   Mouth:  pharyngeal erythema and fair dentition.   Neck:   supple and no masses.   Lungs:  normal respiratory effort and normal breath sounds.   Heart:  normal rate and regular rhythm.   Extremities:  no edema, no erythema    Impression & Recommendations:  Problem # 1:  SINUSITIS- ACUTE-NOS (ICD-461.9)  His updated medication list for this problem includes:    Fluticasone Propionate 50 Mcg/act Susp (Fluticasone propionate) .Marland Kitchen... 2 spray two times a day prn    Azithromycin 250 Mg Tabs (Azithromycin) .Marland Kitchen... 2po qd for 1 day, then 1po qd for 4days, then stop treat as above, f/u any worsening signs or symptoms   Problem # 2:  DIABETES MELLITUS, TYPE II (ICD-250.00)  His updated medication list for this problem includes:    Adult Aspirin Ec Low Strength 81 Mg  Tbec (Aspirin) .Marland Kitchen... 1 by mouth once daily    Metformin Hcl 500 Mg Tabs (Metformin hcl) .Marland Kitchen... 1 by mouth once daily    Benazepril Hcl 20 Mg Tabs (Benazepril hcl) .Marland Kitchen... 1po once daily  Labs Reviewed: Creat: 1.1 (08/19/2008)    Reviewed HgBA1c results: 6.5 (08/19/2008)  6.9 (07/20/2007) stable overall by hx and exam, ok to continue meds/tx as is   Problem # 3:  HYPERTENSION (ICD-401.9)  His updated medication list for this problem includes:    Triamterene-hctz 37.5-25 Mg Tabs (Triamterene-hctz) .Marland Kitchen... 1 by mouth once daily    Carvedilol 12.5 Mg Tabs (Carvedilol) .Marland Kitchen... 1/2 by mouth two times a day    Benazepril Hcl 20 Mg Tabs (Benazepril hcl) .Marland Kitchen... 1po once daily  BP today: 112/78 Prior BP: 110/72 (02/18/2009)  Labs Reviewed: K+: 4.1 (08/19/2008) Creat: : 1.1 (08/19/2008)   Chol: 133 (08/19/2008)   HDL: 39.60 (08/19/2008)   LDL: 65 (08/19/2008)   TG: 143.0 (08/19/2008) stable overall by hx and exam, ok to continue meds/tx as is   Complete Medication List: 1)  Crestor 20 Mg Tabs (Rosuvastatin calcium) .Marland Kitchen.. 1 by mouth once daily 2)  Flomax 0.4 Mg Cp24 (Tamsulosin hcl) .Marland Kitchen.. 1 by mouth once daily 3)  Triamterene-hctz 37.5-25 Mg Tabs (Triamterene-hctz) .Marland Kitchen.. 1 by mouth once daily 4)   Cetirizine Hcl 10 Mg Tabs (Cetirizine hcl) .Marland Kitchen.. 1po once daily 5)  Carvedilol 12.5 Mg Tabs (Carvedilol) .... 1/2 by mouth two times a day 6)  Glucosamine 1500 Complex Caps (Glucosamine-chondroit-vit c-mn) .... 4 by mouth qd 7)  Tylenol Ex St Arthritis Pain 500 Mg Tabs (Acetaminophen) .Marland Kitchen.. 1 by mouth as needed 8)  Darvocet-n 100 100-650 Mg Tabs (Propoxyphene n-apap) .Marland Kitchen.. 1 by mouth q 6 hrs prn 9)  Adult Aspirin Ec Low Strength 81 Mg Tbec (Aspirin) .Marland Kitchen.. 1 by mouth once daily 10)  Fluticasone Propionate 50 Mcg/act Susp (Fluticasone propionate) .... 2 spray two times a day prn 11)  Metformin Hcl 500 Mg Tabs (Metformin hcl) .Marland Kitchen.. 1 by mouth once daily 12)  Nystatin 100000 Unit/gm Crea (Nystatin) .... As needed 13)  Lotrisone 1-0.05 % Crea (Clotrimazole-betamethasone) .... Use asd two times a day as needed 14)  Fluconazole 100 Mg Tabs (Fluconazole) .Marland Kitchen.. 1po once daily 15)  Benazepril Hcl 20 Mg Tabs (Benazepril hcl) .Marland Kitchen.. 1po once daily 16)  Onetouch Ultra Test Strp (Glucose blood) .... Use asd 1 once daily 17)  Viagra 100 Mg Tabs (Sildenafil citrate) .Marland Kitchen.. 1 by mouth every other day as needed 18)  Azithromycin 250 Mg Tabs (Azithromycin) .... 2po qd for 1 day, then 1po qd for 4days, then stop  Patient Instructions: 1)  Please take all new medications as prescribed 2)  Continue all previous medications as before this visit  3)  Please schedule a follow-up appointment in 2 months with CPX labs and: 4)  HbgA1C prior to visit, ICD-9: 250.02 5)  Urine Microalbumin prior to visit, ICD-9: Prescriptions: AZITHROMYCIN 250 MG TABS (AZITHROMYCIN) 2po qd for 1 day, then 1po qd for 4days, then stop  #6 x 1   Entered and Authorized by:   Corwin Levins MD   Signed by:   Corwin Levins MD on 06/17/2009   Method used:   Print then Give to Patient   RxID:   [id number]

## 2010-03-09 NOTE — Letter (Signed)
Summary: [facility]  [facility]   Imported By: Sherian Rein 06/29/2009 08:47:51  _____________________________________________________________________  External Attachment:    Type:   Image     Comment:   External Document

## 2010-03-11 NOTE — Assessment & Plan Note (Signed)
Summary: 6 MO ROV /NWS  #   Vital Signs:  Patient profile:   76 year old male Height:      71 inches Weight:      289.38 pounds BMI:     40.51 O2 Sat:      97 % on Room air Temp:     98.3 degrees F oral Pulse rate:   64 / minute BP sitting:   100 / 70  (left arm) Cuff size:   large  Vitals Entered By: Zella Ball Ewing CMA (AAMA) (March 01, 2010 11:02 AM)  O2 Flow:  Room air CC: 6 month ROV/RE   CC:  6 month ROV/RE.  History of Present Illness: here to f/u; overall doing ok but admits to dietary noncomplaince over the recent holidays, and has gained 14 lbs overall since july 2011;  Pt denies CP, worsening sob, doe, wheezing, orthopnea, pnd, worsening LE edema, palps, dizziness or syncope  Pt denies new neuro symptoms such as headache, facial or extremity weakness  Pt denies polydipsia, polyuria, or low sugar symptoms such as shakiness improved with eating.  Overall good compliance with meds, trying to follow low chol, DM diet,  little excercise however  No fever, wt loss, night sweats, loss of appetite or other constitutional symptoms  Overall good compliance with meds, and good tolerability.  Does have rash to left medial foot similar to previous that resolved with lotrisone and requests refill;  no pain, only mild itch.  Preventive Screening-Counseling & Management      Drug Use:  no.    Problems Prior to Update: 1)  Preventive Health Care  (ICD-V70.0) 2)  Sinusitis- Acute-nos  (ICD-461.9) 3)  Rash-nonvesicular  (ICD-782.1) 4)  Knee Pain, Right  (ICD-719.46) 5)  Preventive Health Care  (ICD-V70.0) 6)  Diverticulosis, Colon  (ICD-562.10) 7)  Depression  (ICD-311) 8)  Anxiety  (ICD-300.00) 9)  Nephrolithiasis, Hx of  (ICD-V13.01) 10)  Benign Prostatic Hypertrophy  (ICD-600.00) 11)  Hyperlipidemia  (ICD-272.4) 12)  Peripheral Vascular Disease  (ICD-443.9) 13)  Allergic Rhinitis  (ICD-477.9) 14)  Diabetes Mellitus, Type II  (ICD-250.00) 15)  Hypertension   (ICD-401.9)  Medications Prior to Update: 1)  Crestor 20 Mg Tabs (Rosuvastatin Calcium) .Marland Kitchen.. 1 By Mouth Once Daily 2)  Flomax 0.4 Mg Cp24 (Tamsulosin Hcl) .Marland Kitchen.. 1 By Mouth Once Daily 3)  Triamterene-Hctz 37.5-25 Mg  Tabs (Triamterene-Hctz) .Marland Kitchen.. 1 By Mouth Once Daily 4)  Cetirizine Hcl 10 Mg  Tabs (Cetirizine Hcl) .Marland Kitchen.. 1po Once Daily 5)  Carvedilol 12.5 Mg  Tabs (Carvedilol) .... 1/2 By Mouth Two Times A Day 6)  Glucosamine 1500 Complex   Caps (Glucosamine-Chondroit-Vit C-Mn) .... 4 By Mouth Qd 7)  Tylenol Ex St Arthritis Pain 500 Mg  Tabs (Acetaminophen) .Marland Kitchen.. 1 By Mouth As Needed 8)  Darvocet-N 100 100-650 Mg  Tabs (Propoxyphene N-Apap) .Marland Kitchen.. 1 By Mouth Q 6 Hrs Prn 9)  Adult Aspirin Ec Low Strength 81 Mg  Tbec (Aspirin) .Marland Kitchen.. 1 By Mouth Once Daily 10)  Fluticasone Propionate 50 Mcg/act  Susp (Fluticasone Propionate) .... 2 Spray Two Times A Day Prn 11)  Metformin Hcl 500 Mg  Tabs (Metformin Hcl) .Marland Kitchen.. 1 By Mouth Two Times A Day 12)  Nystatin 100000 Unit/gm Crea (Nystatin) .... As Needed 13)  Lotrisone 1-0.05 % Crea (Clotrimazole-Betamethasone) .... Use Asd Two Times A Day As Needed 14)  Fluconazole 100 Mg Tabs (Fluconazole) .Marland Kitchen.. 1po Once Daily 15)  Benazepril Hcl 20 Mg Tabs (Benazepril Hcl) .Marland Kitchen.. 1po Once Daily  16)  Onetouch Ultra Test  Strp (Glucose Blood) .... Use Asd 1 Once Daily 17)  Viagra 100 Mg Tabs (Sildenafil Citrate) .Marland Kitchen.. 1 By Mouth Every Other Day As Needed 18)  Azithromycin 250 Mg Tabs (Azithromycin) .... 2po Qd For 1 Day, Then 1po Qd For 4days, Then Stop 19)  Centrum Silver  Tabs (Multiple Vitamins-Minerals) .Marland Kitchen.. 1 By Mouth Once Daily 20)  Januvia 100 Mg Tabs (Sitagliptin Phosphate) .Marland Kitchen.. 1po Once Daily  Current Medications (verified): 1)  Crestor 20 Mg Tabs (Rosuvastatin Calcium) .Marland Kitchen.. 1 By Mouth Once Daily 2)  Flomax 0.4 Mg Cp24 (Tamsulosin Hcl) .Marland Kitchen.. 1 By Mouth Once Daily 3)  Triamterene-Hctz 37.5-25 Mg  Tabs (Triamterene-Hctz) .Marland Kitchen.. 1 By Mouth Once Daily 4)  Cetirizine Hcl 10 Mg   Tabs (Cetirizine Hcl) .Marland Kitchen.. 1po Once Daily 5)  Carvedilol 12.5 Mg  Tabs (Carvedilol) .... 1/2 By Mouth Two Times A Day 6)  Glucosamine 1500 Complex   Caps (Glucosamine-Chondroit-Vit C-Mn) .... 4 By Mouth Qd 7)  Tylenol Ex St Arthritis Pain 500 Mg  Tabs (Acetaminophen) .Marland Kitchen.. 1 By Mouth As Needed 8)  Adult Aspirin Ec Low Strength 81 Mg  Tbec (Aspirin) .Marland Kitchen.. 1 By Mouth Once Daily 9)  Fluticasone Propionate 50 Mcg/act  Susp (Fluticasone Propionate) .... 2 Spray Two Times A Day Prn 10)  Metformin Hcl 1000 Mg Tabs (Metformin Hcl) .Marland Kitchen.. 1po Two Times A Day 11)  Lotrisone 1-0.05 % Crea (Clotrimazole-Betamethasone) .... Use Asd Two Times A Day As Needed 12)  Benazepril Hcl 20 Mg Tabs (Benazepril Hcl) .Marland Kitchen.. 1po Once Daily 13)  Onetouch Ultra Test  Strp (Glucose Blood) .... Use Asd 1 Once Daily 14)  Viagra 100 Mg Tabs (Sildenafil Citrate) .Marland Kitchen.. 1 By Mouth Every Other Day As Needed 15)  Centrum Silver  Tabs (Multiple Vitamins-Minerals) .Marland Kitchen.. 1 By Mouth Once Daily 16)  Januvia 100 Mg Tabs (Sitagliptin Phosphate) .Marland Kitchen.. 1po Once Daily 17)  Onetouch Test  Strp (Glucose Blood) .... Use Asd 1 Once Daily 18)  Lancets  Misc (Lancets) .... 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, left shoulder, ankles Diverticulosis, colon Hypertensive Cardiomyopathy/ no CHF  Past Surgical History: Last updated: 02/18/2009 Denies surgical history a/p lithotripsy s/p bilat knee replacements - last was left knee aug 2010 - dr Charlann Boxer  Social History: Last updated: 03/01/2010 Never Smoked Alcohol use-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   Head:  normocephalic and atraumatic.   Eyes:  vision grossly intact, pupils equal, and pupils round.   Ears:  R ear normal and L ear normal.   Nose:  no external deformity and no nasal discharge.   Mouth:  no gingival abnormalities and pharynx pink and moist.   Neck:  supple and no masses.   Lungs:  normal respiratory effort.  Heart:  normal rate and regular rhythm.   Extremities:  tace ankle edema bilat Skin:  color normal and no rashes.  except for left medial foot with ringworm type leison noted   Impression & Recommendations:  Problem # 1:  DIABETES MELLITUS, TYPE II (ICD-250.00)  His updated medication list for this problem includes:    Adult Aspirin Ec Low Strength 81 Mg Tbec (Aspirin) .Marland Kitchen... 1 by mouth once daily    Metformin Hcl 1000 Mg Tabs (Metformin hcl) .Marland Kitchen... 1po two times a day    Benazepril Hcl 20 Mg Tabs (Benazepril hcl) .Marland Kitchen... 1po once daily    Januvia 100 Mg Tabs (Sitagliptin phosphate) .Marland Kitchen... 1po once daily uncontrolled, with recent wt gain, has been through DM clinic in the past and declines further, for new glucometer and strips today, and incr the metformin to 1000 two times a day ; Continue all previous medications as before this visit , and Pt to cont DM diet, excercise, wt control efforts; to check labs next visit   Labs Reviewed: Creat: 1.1 (02/25/2010)    Reviewed HgBA1c results: 8.3 (02/25/2010)  9.1 (08/21/2009)  Problem # 2:  HYPERTENSION (ICD-401.9)  His updated medication list for this problem includes:    Triamterene-hctz 37.5-25 Mg Tabs (Triamterene-hctz) .Marland Kitchen... 1  by mouth once daily    Carvedilol 12.5 Mg Tabs (Carvedilol) .Marland Kitchen... 1/2 by mouth two times a day    Benazepril Hcl 20 Mg Tabs (Benazepril hcl) .Marland Kitchen... 1po once daily  BP today: 100/70 Prior BP: 112/78 (08/24/2009)  Labs Reviewed: K+: 4.6 (02/25/2010) Creat: : 1.1 (02/25/2010)   Chol: 134 (02/25/2010)   HDL: 37.70 (02/25/2010)   LDL: 65 (08/19/2008)   TG: 242.0 (02/25/2010) stable overall by hx and exam, ok to continue meds/tx as is   Problem # 3:  HYPERLIPIDEMIA (ICD-272.4)  His updated medication list for this problem includes:    Crestor 20 Mg Tabs (Rosuvastatin calcium) .Marland Kitchen... 1 by mouth once daily  Labs Reviewed: SGOT: 27 (08/21/2009)   SGPT: 36 (08/21/2009)   HDL:37.70 (02/25/2010), 38.60 (08/21/2009)  LDL:65 (08/19/2008), DEL (02/72/5366)  Chol:134 (02/25/2010), 147 (08/21/2009)  Trig:242.0 (02/25/2010), 280.0 (08/21/2009) stable overall by hx and exam, ok to continue meds/tx as is   Problem # 4:  RASH-NONVESICULAR (ICD-782.1)  The following medications were removed from the medication list:    Nystatin 100000 Unit/gm Crea (Nystatin) .Marland Kitchen... As needed His updated medication list for this problem includes:    Lotrisone 1-0.05 % Crea (Clotrimazole-betamethasone) ..... Use asd two times a day as needed to left foot - recurrent, for repeat treat as above, f/u any worsening signs or symptoms   Complete Medication List: 1)  Crestor 20 Mg Tabs (Rosuvastatin calcium) .Marland Kitchen.. 1 by mouth once daily 2)  Flomax 0.4 Mg Cp24 (Tamsulosin hcl) .Marland Kitchen.. 1 by mouth once daily 3)  Triamterene-hctz 37.5-25 Mg Tabs (Triamterene-hctz) .Marland Kitchen.. 1 by mouth once daily 4)  Cetirizine Hcl 10 Mg Tabs (Cetirizine hcl) .Marland Kitchen.. 1po once daily 5)  Carvedilol 12.5 Mg Tabs (Carvedilol) .... 1/2 by mouth two times a day 6)  Glucosamine 1500 Complex Caps (Glucosamine-chondroit-vit c-mn) .... 4 by mouth qd 7)  Tylenol Ex St Arthritis Pain 500 Mg Tabs (Acetaminophen) .Marland Kitchen.. 1 by mouth as needed 8)  Adult Aspirin Ec Low Strength 81  Mg Tbec (Aspirin) .Marland Kitchen.. 1 by mouth once daily 9)  Fluticasone Propionate 50 Mcg/act Susp (Fluticasone propionate) .... 2 spray two times a day prn 10)  Metformin Hcl 1000 Mg Tabs (Metformin hcl) .Marland Kitchen.. 1po  two times a day 11)  Lotrisone 1-0.05 % Crea (Clotrimazole-betamethasone) .... Use asd two times a day as needed 12)  Benazepril Hcl 20 Mg Tabs (Benazepril hcl) .Marland Kitchen.. 1po once daily 13)  Onetouch Ultra Test Strp (Glucose blood) .... Use asd 1 once daily 14)  Viagra 100 Mg Tabs (Sildenafil citrate) .Marland Kitchen.. 1 by mouth every other day as needed 15)  Centrum Silver Tabs (Multiple vitamins-minerals) .Marland Kitchen.. 1 by mouth once daily 16)  Januvia 100 Mg Tabs (Sitagliptin phosphate) .Marland Kitchen.. 1po once daily 17)  Onetouch Test Strp (Glucose blood) .... Use asd 1 once daily 18)  Lancets Misc (Lancets) .... Use asd 1 once daily  Patient Instructions: 1)  you are given the refill on the cream today (sent to the pharmacy) 2)  increase the metformin to 1000 mg two times a day  3)  Continue all previous medications as before this visit  4)  please follow lower calorie diabetic diet, and try to be more active 5)  Plesae check your sugars at least once per day, either before bfast (fasting) or before dinner in the evening 6)  please call if you sugars are regularly more than 175-200 or more 7)  Please schedule a follow-up appointment in 6 months , or sooner if needed, for CPX with labs and : 8)  HbgA1C prior to visit, ICD-9: 250.02 9)  Urine Microalbumin prior to visit, ICD-9: Prescriptions: LANCETS  MISC (LANCETS) use asd 1 once daily  #100 x 11   Entered and Authorized by:   Corwin Levins MD   Signed by:   Corwin Levins MD on 03/01/2010   Method used:   Print then Give to Patient   RxID:   [id number] ONETOUCH TEST  STRP (GLUCOSE BLOOD) use asd 1 once daily  #100 x 1   Entered and Authorized by:   Corwin Levins MD   Signed by:   Corwin Levins MD on 03/01/2010   Method used:   Print then Give to Patient   RxID:    [id number] LOTRISONE 1-0.05 % CREA (CLOTRIMAZOLE-BETAMETHASONE) use asd two times a day as needed  #1 x 1   Entered and Authorized by:   Corwin Levins MD   Signed by:   Corwin Levins MD on 03/01/2010   Method used:   Electronically to        [id number] (retail)       [address]       [place], [place]  [address]       Ph: [phone] or [phone]       Fax: [phone]   RxID:   [id number] METFORMIN HCL 1000 MG TABS (METFORMIN HCL) 1po two times a day  #180 x 3   Entered and Authorized by:   Corwin Levins MD   Signed by:   Corwin Levins MD on 03/01/2010   Method used:   Electronically to        [id number] (retail)       [address]       [place], [place]  [address]       Ph: [phone] or [phone]       Fax: [phone]   RxID:   [id number]    Orders Added: 1)  Est. Patient Level IV [06269]

## 2010-03-17 NOTE — Letter (Signed)
Summary: [facility]  [facility]   Imported By: Lennie Odor 03/08/2010 14:37:53  _____________________________________________________________________  External Attachment:    Type:   Image     Comment:   External Document

## 2010-04-13 ENCOUNTER — Encounter (INDEPENDENT_AMBULATORY_CARE_PROVIDER_SITE_OTHER): Payer: Medicare Other

## 2010-04-13 ENCOUNTER — Ambulatory Visit (INDEPENDENT_AMBULATORY_CARE_PROVIDER_SITE_OTHER): Payer: Medicare Other | Admitting: Vascular Surgery

## 2010-04-13 ENCOUNTER — Other Ambulatory Visit (INDEPENDENT_AMBULATORY_CARE_PROVIDER_SITE_OTHER): Payer: Medicare Other

## 2010-04-13 DIAGNOSIS — I714 Abdominal aortic aneurysm, without rupture: Secondary | ICD-10-CM

## 2010-04-13 DIAGNOSIS — I6529 Occlusion and stenosis of unspecified carotid artery: Secondary | ICD-10-CM

## 2010-04-14 NOTE — Assessment & Plan Note (Signed)
OFFICE VISIT  [name], [name] DOB:  04-23-1934                                       04/13/2010 CHART#:[id number]  The patient is a 76 year old male patient, previously seen in this office for abdominal aortic aneurysm and carotid occlusive disease.  He has a known right internal carotid occlusion which has been asymptomatic.  He has mild left internal carotid plaque formation with no significant stenosis which we have followed on a regular basis in the vascular lab.  He denies any symptoms of hemiparesis, aphasia, amaurosis fugax, diplopia, blurred vision or syncope and has no history of stroke.  CHRONIC MEDICAL PROBLEMS:  All stable: 1. Type 2 diabetes mellitus. 2. Hypertension. 3. Hyperlipidemia. 4. History of right ICA occlusion. 5. History of bilateral cataract surgery. 6. History of bilateral knee replacement.  SOCIAL HISTORY:  The patient is married, has 3 children and is retired. Does not use tobacco or alcohol.  Does not smoked in 30 years.  FAMILY HISTORY:  Positive for COPD in mother who died at 63.  Negative for coronary artery disease, diabetes or stroke.  REVIEW OF SYSTEMS:  Positive for arthritis, bilateral knee replacements. Denies chest pain, dyspnea on exertion, wheezing, chronic bronchitis, hemoptysis.  Some visual symptoms in the past related to cataracts.  All other systems are negative in complete review of systems.  PHYSICAL EXAMINATION:  Blood pressure 119/86, heart rate 69, respirations 22.  General:  He is a well-developed, well-nourished male who is in no apparent distress alert and oriented x3.  HEENT:  Exam is normal for age.  EOMs intact.  Lungs:  Clear to auscultation.  No rhonchi or wheezing.  Cardiovascular:  Regular rhythm.  No murmurs. Carotid pulses are 3+ on the left with no bruits audible.  Abdomen: Soft, nontender with no palpable masses.  Musculoskeletal:  Exam is free of major deformities.  Neurologic:   Normal.  Skin:  Free of rashes. Lower extremities:  3+ femoral, 2+ dorsalis pedis pulses bilaterally.  Today I ordered a carotid duplex exam and duplex scan of his aorta.  The aneurysm continues to be around 3.7 cm in maximum diameter,  slightly larger from previous study of 1 year ago at 3.32.  Carotid artery on the left continues to be widely patent.  In general, I think his situation is quite stable and he is asymptomatic.  We will see him again in 2 years to follow his carotid disease and  his aneurysm unless he develops any symptoms in the interim.  He will continue taking one 81 mg aspirin tablet per day.    Quita Skye Hart [place], M.D. Electronically Signed  JDL/MEDQ  D:  04/13/2010  T:  04/14/2010  Job:  [id number]  cc:   Corwin Levins, MD

## 2010-04-21 NOTE — Procedures (Unsigned)
CAROTID DUPLEX EXAM  INDICATION:  Carotid disease.  HISTORY: Diabetes:  Yes. Cardiac:  Yes. Hypertension:  Yes. Smoking:  Previous. Previous Surgery:  No. CV History:  Currently asymptomatic. Amaurosis Fugax No, Paresthesias No, Hemiparesis No.                                      RIGHT             LEFT Brachial systolic pressure: Brachial Doppler waveforms: Vertebral direction of flow:                          Antegrade DUPLEX VELOCITIES (cm/sec) CCA peak systolic                                     93 ECA peak systolic                                     105 ICA peak systolic                                     101 ICA end diastolic                                     32 PLAQUE MORPHOLOGY:                                    Mixed PLAQUE AMOUNT:                                        Mild/moderate PLAQUE LOCATION:                                      ICA/ECA  IMPRESSION: 1. No hemodynamically significant stenosis of the left internal     carotid artery with plaque formations as described above. 2. Known occlusion of the right internal carotid artery. 3. No significant change in Doppler velocities of the left internal     carotid artery when compared to the previous examination on     02/23/2009.  ___________________________________________ Quita Skye. Hart [place], M.D.  CH/MEDQ  D:  04/13/2010  T:  04/13/2010  Job:  [id number]

## 2010-04-21 NOTE — Procedures (Unsigned)
DUPLEX ULTRASOUND OF ABDOMINAL AORTA  INDICATION:  Abdominal aortic aneurysm.  HISTORY: Diabetes:  Yes. Cardiac:  Yes. Hypertension:  Yes. Smoking:  Previous. Connective Tissue Disorder: Family History:  No. Previous Surgery:  No.  DUPLEX EXAM:         AP (cm)                   TRANSVERSE (cm) Proximal             Not visualized            Not visualized Mid                  2.1 cm                    2.4 cm Distal               3.7 cm                    3.7 cm Right Iliac          1.5 cm                    1.4 cm Left Iliac           1.2 cm                    1.4 cm  PREVIOUS:  Date:  02/23/2009  AP:  3.23  TRANSVERSE:  3.32  IMPRESSION: 1. Stable aneurysmal dilatation of the distal abdominal aorta noted     when compared to the previous exams. 2. Unable to adequately visualize the proximal abdominal aorta due to     overlying bowel gas patterns and patient body habitus.  ___________________________________________ Quita Skye. Hart [place], M.D.  CH/MEDQ  D:  04/13/2010  T:  04/13/2010  Job:  [id number]

## 2010-05-15 LAB — DIFFERENTIAL
Eosinophils Relative: 6 % — ABNORMAL HIGH (ref 0–5)
Lymphocytes Relative: 33 % (ref 12–46)
Lymphs Abs: 2.3 10*3/uL (ref 0.7–4.0)
Monocytes Absolute: 0.4 10*3/uL (ref 0.1–1.0)
Neutro Abs: 3.8 10*3/uL (ref 1.7–7.7)
Neutrophils Relative %: 55 % (ref 43–77)

## 2010-05-15 LAB — BASIC METABOLIC PANEL
BUN: 15 mg/dL (ref 6–23)
BUN: 20 mg/dL (ref 6–23)
CO2: 28 mEq/L (ref 19–32)
CO2: 30 mEq/L (ref 19–32)
Calcium: 9.3 mg/dL (ref 8.4–10.5)
Chloride: 100 mEq/L (ref 96–112)
Creatinine, Ser: 1.05 mg/dL (ref 0.4–1.5)
Creatinine, Ser: 1.1 mg/dL (ref 0.4–1.5)
Creatinine, Ser: 1.12 mg/dL (ref 0.4–1.5)
GFR calc Af Amer: >60 mL/min (ref >60)
GFR calc non Af Amer: >60 mL/min (ref >60)
Glucose, Bld: 139 mg/dL — ABNORMAL HIGH (ref 70–99)
Glucose, Bld: 162 mg/dL — ABNORMAL HIGH (ref 70–99)
Potassium: 4.2 mEq/L (ref 3.5–5.1)
Sodium: 141 mEq/L (ref 135–145)

## 2010-05-15 LAB — CBC
HCT: 30.4 % — ABNORMAL LOW (ref 39.0–52.0)
Hemoglobin: 10.6 g/dL — ABNORMAL LOW (ref 13.0–17.0)
MCHC: 34.7 g/dL (ref 30.0–36.0)
MCHC: 34.9 g/dL (ref 30.0–36.0)
MCV: 93.5 fL (ref 78.0–100.0)
MCV: 93.6 fL (ref 78.0–100.0)
Platelets: 113 10*3/uL — ABNORMAL LOW (ref 150–400)
Platelets: 119 10*3/uL — ABNORMAL LOW (ref 150–400)
RDW: 13 % (ref 11.5–15.5)
WBC: 6.9 10*3/uL (ref 4.0–10.5)
WBC: 7.9 10*3/uL (ref 4.0–10.5)
WBC: 9.3 10*3/uL (ref 4.0–10.5)

## 2010-05-15 LAB — URINALYSIS, ROUTINE W REFLEX MICROSCOPIC
Glucose, UA: NEGATIVE mg/dL
Ketones, ur: NEGATIVE mg/dL
Nitrite: NEGATIVE
Protein, ur: NEGATIVE mg/dL
Specific Gravity, Urine: 1.02 (ref 1.005–1.030)
Urobilinogen, UA: 1 mg/dL (ref 0.0–1.0)

## 2010-05-15 LAB — GLUCOSE, CAPILLARY
Glucose-Capillary: 131 mg/dL — ABNORMAL HIGH (ref 70–99)
Glucose-Capillary: 163 mg/dL — ABNORMAL HIGH (ref 70–99)
Glucose-Capillary: 166 mg/dL — ABNORMAL HIGH (ref 70–99)
Glucose-Capillary: 200 mg/dL — ABNORMAL HIGH (ref 70–99)

## 2010-05-15 LAB — TYPE AND SCREEN

## 2010-05-15 LAB — APTT: aPTT: 22 seconds — ABNORMAL LOW (ref 24–37)

## 2010-06-22 NOTE — Op Note (Signed)
NAMEZACHARIA, [name] NO.:  [id number]   MEDICAL RECORD NO.:  [id number]          PATIENT TYPE:  INP   LOCATION:  0006                         FACILITY:  [facility]   PHYSICIAN:  Madlyn Frankel. Charlann Boxer, M.D.  DATE OF BIRTH:  05-Feb-1935   DATE OF PROCEDURE:  10/08/2007  DATE OF DISCHARGE:                               OPERATIVE REPORT   PREOPERATIVE DIAGNOSIS:  Right knee osteoarthritis.   POSTOPERATIVE DIAGNOSIS:  Right knee osteoarthritis.   PROCEDURE:  Right total knee replacement.   COMPONENTS USED:  DePuy rotating platform posterior stabilized knee  system; size 5 femur, 5 tibia, 12.5 insert, and a 41 patellar button.   SURGEON:  Madlyn Frankel. Charlann Boxer, M.D.   ASSISTANT:  Yetta Glassman. Mann, PA   ANESTHESIA:  Dermal with spinal.   COMPLICATIONS:  None.   DRAINS:  One.   TOURNIQUET TIME:  40 minutes at 250 mmHg.   BLOOD LOSS:  Minimal.   INDICATIONS FOR PROCEDURE:  Mr. Sides is a 76 year old gentleman who  presented to the office with bilateral knee pain, right greater left,  that had failed conservative measures and injections.  Given the  persistence of discomfort, decreased quality of life and limited  functional ability, he wished to proceed with knee replacement.  We  reviewed the risks and benefits of the procedure including the risk of  infection, DVT, component failure, need for revision surgery of any  reason; consent was obtained.   PROCEDURE IN DETAIL:  The patient was brought to the operative theater.  Once adequate anesthesia, preoperative antibiotics with Ancef was  administered, the patient was positioned supine with a thigh tourniquet  placed.  The right lower extremity was prescrubbed and prepped and  draped in a sterile fashion.  The leg was exsanguinated and the  tourniquet elevated to 250 mmHg.  A midline incision was made, followed  by a modified median arthrotomy.   Following initial debridement, attention was directed to the patella.  Precut measurement was 26 mm.  I resected down to 14 mm and used a 41  patellar button, debriding the osteophytes.  We left the button in place  to protect the cut surface of the patella from retractors, as well as  from the saw.   Attention was now directed to the femur.  Femoral exposure obtained.  Debridement of osteophytes in the cruciates and meniscus was carried  out.  I used the drill to open up the intramedullary canal.  The  intramedullary canal was then irrigated to prevent fat emboli.  At 3  degrees of valgus I resected 10 mm of bone off the distal femur.  We  sized the femur to be a size 5.   At this point I subluxated the tibia anteriorly.  Retractors were placed  and using the extramedullary guide I resected 10 mm of bone off the  proximal and lateral tibia.  We then checked with an extension block and  found that the knee came to full extension, with stable medial and  lateral collateral ligaments.   At this point attention was redirected back to the femur.  Rotational  blocks were utilized to set the rotation based off the perpendicular cut  of the proximal tibia, that was checked with an alignment rod.  Pins  were placed.  The size 5 cutting block was placed, and then checked to  make sure I was not going notch using the angel wing.   The anterior, posterior and chamfer cuts were then made without  difficulty and no notching.  A box cut was made based off the anatomy of  the femur.  The tibia was now subluxed anteriorly.  Further debridement  was carried out as necessary.  I sized the cut surface of the tibia to  be a size 5.  It was pinned into position to the medial third of the  tubercle, drilled and keel punched.  A trial reduction was carried out  with a 10-mm insert first.  With the 10-mm insert I felt that it was a  little bit high to extension, and felt that the 12.5 fit the best.  At  this point all trial components were removed.  Final components were   opened, holding on the insert.  The cement was mixed as we irrigated the  knee out, and injected the synovial capsule junction with 60 mL of 0.25%  Marcaine with epinephrine and 1 mL of Toradol.  Final components were  then cemented into position and the knee brought to extension with a  12.5 insert.  Extruded cement was removed.  Once cement had cured, we  removed the remaining cement off the posterior aspect of the knee.  Once  we were comfortable there was no remaining cement, the knee was  irrigated and the final 12.5 x 5 insert placed.  The knee was brought  out to extension.  The tourniquet was let down at 40 minutes.  The knee  was irrigated again with pulse lavage.  A Hemovac drain was placed deep.  The extensor mechanism was then reapproximated using #1 Vicryl with the  knee in flexion.  The remainder of the wound was closed with 2-0 Vicryl  and running 4-0 Monocryl.  The patient was brought to the recovery room  with his knee dressed sterilely in a sterile bulky Jones dressing.      Madlyn Frankel Charlann Boxer, M.D.  Electronically Signed     MDO/MEDQ  D:  10/08/2007  T:  10/08/2007  Job:  [id number]

## 2010-06-22 NOTE — Procedures (Signed)
CAROTID DUPLEX EXAM   INDICATION:  Follow up carotid artery disease.   HISTORY:  Diabetes:  Yes  Cardiac:  Yes  Hypertension:  Yes  Smoking:  Previous  Previous Surgery:  No  CV History:  Asymptomatic  Amaurosis Fugax No, Paresthesias No, Hemiparesis No                                       RIGHT             LEFT  Brachial systolic pressure:         132               128  Brachial Doppler waveforms:         WNL               WNL  Vertebral direction of flow:        antegrade         antegrade  DUPLEX VELOCITIES (cm/sec)  CCA peak systolic                   69                121  ECA peak systolic                   148               125  ICA peak systolic                   occluded          115  ICA end diastolic                   occluded          42  PLAQUE MORPHOLOGY:                  mixed             mixed  PLAQUE AMOUNT:                      Occluded ICA      mild  PLAQUE LOCATION:                    ICA/bifurcation   ICA/ECA/CCA   IMPRESSION:  1. Right internal carotid artery known occlusion.  2. left internal carotid artery shows evidence of 40- 59% stenosis      (low end of range) with velocities slightly lower than previous      study, correlating more with study prior to that.   ___________________________________________  [name]. Hart Rochester, M.D.   AS/MEDQ  D:  02/23/2009  T:  02/23/2009  Job:  [id number]

## 2010-06-22 NOTE — Procedures (Signed)
DUPLEX ULTRASOUND OF ABDOMINAL AORTA   INDICATION:  Follow up abdominal aortic aneurysm.   HISTORY:  Diabetes:  yes  Cardiac:  yes  Hypertension:  yes  Smoking:  previous  Connective Tissue Disorder:  Family History:  no  Previous Surgery:  No   DUPLEX EXAM:         AP (cm)                   TRANSVERSE (cm)  Proximal             1.87 cm                   2.08 cm  Mid                  1.89 cm                   2.10 cm  Distal               3.23 cm                   3.32 cm  Right Iliac          1.27 cm                   1.33 cm  Left Iliac           1.32 cm                   1.35 cm   PREVIOUS:  Date: 07/29/2008  AP:  3.37  TRANSVERSE:  3.86   IMPRESSION:  Stable abdominal aortic aneurysm measuring 3.23 cm x 3.32  cm, slightly smaller than previous study, consistent with studies prior  to that.   ___________________________________________  Quita Skye. Hart Rochester, M.D.   AS/MEDQ  D:  02/23/2009  T:  02/23/2009  Job:  [id number]

## 2010-06-22 NOTE — H&P (Signed)
NAME:  [name], [name] NO.:  [id number]   MEDICAL RECORD NO.:  [id number]          PATIENT TYPE:  INP   LOCATION:  NA                           FACILITY:  [facility]   PHYSICIAN:  Madlyn Frankel. Charlann Boxer, M.D.  DATE OF BIRTH:  [birth year]   DATE OF ADMISSION:  DATE OF DISCHARGE:                              HISTORY & PHYSICAL   PROCEDURE:  Right total knee arthroplasty.   CHIEF COMPLAINT:  Right knee pain.   HISTORY OF PRESENT ILLNESS:  This 76 year old male with a history of  right knee pain secondary to osteoarthritis.  He has been refractory to  all conservative treatment.  He has been presurgically assessed for and  cleared for a right total knee replacement.   PRIMARY CARE PHYSICIAN:  Dr. Oliver Barre.   CARDIOLOGIST:  Dr. Viann Fish.   PAST MEDICAL HISTORY:  Significant for:  1. Osteoarthritis.  2. Hypertension.  3. Cardiomyopathy, etiopathic  4. Hyperlipidemia.  5. Carotid artery stenosis.  6. Diabetes.   PAST SURGICAL HISTORY:  Cataract surgery 5-6 years ago.   FAMILY MEDICAL HISTORY:  ALS, heart disease, COPD.   SOCIAL HISTORY:  Married, nonsmoker.  Primary caregiver will be his wife  in the home.   DRUG ALLERGIES:  No known drug allergies.   MEDICATIONS:  1. Crestor 20 mg p.o. q.d.  2. Flomax 0.4 mg p.o. q.d.  3. Triamterene, hydrochlorothiazide 37.5/25 1 p.o. q.d.  4. Benazepril 20 mg p.o. q.d.  5. Carvedilol 12.5 mg tablet 1/2 tablet b.i.d.  6. Metformin 500 mg q.d.  7. Cetirizine 10 mg p.o. q.d. p.r.n.  8. Fluticasone 50 mcg as needed.  9. Aspirin 81 mg q.d.  10.Glucosamine 1500 q.d.  11.Darvocet 10/650 p.r.n.  12.Celebrex 200 mg 1 p.o. b.i.d. perioperatively x2 weeks after      surgery.   REVIEW OF SYSTEMS:  GENERAL:  He has a weight change of 10 pounds  recently.  HEENT:  He has hearing loss, wears hearing aids.  GENITOURINARY:  He has difficulty with weak stream and increased  urinating at night.  MUSCULOSKELETAL:  Multiple joint pains  and joint  swelling.  Otherwise see HPI.   PHYSICAL EXAMINATION:  Pulse 72.  Respirations 16.  Blood pressure  124/80.  Height 5 feet 11 inches, 254 pounds.  GENERAL:  Awake, alert and oriented, well-developed, well-nourished.  NECK:  Supple.  No carotid bruits.  CHEST/LUNGS:  Clear to auscultation bilaterally.  HEART:  Regular rate and rhythm.  S1, S2 distinct.  ABDOMEN:  Soft, bowel sounds present.  GENITOURINARY:  Deferred.  EXTREMITIES:  Right knee flexion contracture and only flex to  approximately 90 degrees.  Diffuse tenderness.  SKIN:  No cellulitis.  NEUROLOGIC:  Intact distal sensibilities.   LABS:  EKG.  Chest x-ray pending.  Presurgical testing.   IMPRESSION:  Right knee osteoarthritis.   PLAN OF ACTION:  Right total knee arthroplasty [facility] on  October 08, 2007, by surgeon Dr. Durene Romans.  Risks and complications  were discussed.   POSTOPERATIVE MEDICATIONS:  Including Lovenox, Robaxin, iron, aspirin,  Colace, MiraLax provided at time  of surgery.  In addition, Celebrex will  be utilize perioperatively.   The patient would prefer to be sent home with a continuous passive  motion machine to ensure good range of motion in the short-term.  We did  discuss the long-term outcomes that continuous passive motion provides  no benefit.  He has understanding of this but still would prefer a  continuous passive motion at home postoperatively.  He does have a  planned course of stay of 2 days.     ______________________________  Yetta Glassman [address], [place]      Madlyn Frankel. Charlann Boxer, M.D.  Electronically Signed    BLM/MEDQ  D:  10/03/2007  T:  10/03/2007  Job:  [id number]   cc:   Corwin Levins, MD  [address]  [place]  [place] [address]   W. Viann Fish, M.D.  Fax: [phone]  Email: [email or web address]

## 2010-06-22 NOTE — Op Note (Signed)
NAMEZACHREY, DEUTSCHER NO.:  [id number]   MEDICAL RECORD NO.:  [id number]          PATIENT TYPE:  INP   LOCATION:  1604                         FACILITY:  [facility]   PHYSICIAN:  Madlyn Frankel. Charlann Boxer, M.D.  DATE OF BIRTH:  08/12/1934   DATE OF PROCEDURE:  09/30/2008  DATE OF DISCHARGE:                               OPERATIVE REPORT   PREOPERATIVE DIAGNOSIS:  Left knee osteoarthritis.   POSTOPERATIVE DIAGNOSIS:  Left knee osteoarthritis.   PROCEDURE:  Left total knee replacement.   COMPONENTS USED:  The DePuy rotating platform posterior stabilized knee  system, size 5 femur, 5 tibia, 12.5 insert, and 41 patellar button.   ASSISTANT:  Dwyane Luo, PA-C   ANESTHESIA:  Duramorph spinal.   SPECIMENS:  None.   COMPLICATIONS:  None.   DATA REVIEWED:  One Hemovac.   TEST DATA:  44 minutes at 250 mmHg.   ESTIMATED BLOOD LOSS:  About 100 mL or less.   INDICATIONS FOR PROCEDURE:  Mr. Venning is a 76 year old gentleman with  history of bilateral knee osteoarthritis, status post right total knee  replacement.  He successfully underwent the right knee and had done  quite well with it.  His left knee progressed and was not responding to  conservative measures.  He wished at this point to proceed with left  knee arthroplasty.  Risks and benefits of the procedure were discussed  with him.  Consent was obtained on the benefit of pain relief.   PROCEDURE IN DETAIL:  The patient was brought to the operative theater.  Once adequate anesthesia, preoperative antibiotics, and Ancef had been  administered, the patient was positioned supine with a proximal thigh  tourniquet in place.  The left lower extremity was pre-scrubbed, prepped  and draped in sterile fashion.   The time-out was performed to identify the patient, planned procedure,  and extremity.  Midline incision was made followed by median arthrotomy.  Following initial debridement, attention was first directed to the  patella.  A precut measurement was 26 mm.  I resected down to 14 mm and  used a 41 patellar button which fit the cut surface best.  Lug holes  were drilled and a metal shim placed to protect the patella from the saw  blades and retractors.   Attention was now directed to the femur.  The femoral canal was opened  with a drill and irrigated to prevent fat emboli.  The intramedullary  rod was then placed in 5 degrees of valgus.  I resected 10 mm of bone  off the distal femur.   The tibia was then subluxated anteriorly; and using the extramedullary  guide, I resected 10 mm off the proximal tibia.  Following this  resection and further debridements, I checked the cut surface to make  sure it was perpendicular in the coronal plane.  Once this was done, I  set the rotation of the femoral component based off this proximal cut.  The C clamp was placed.  I had sized the femur to be a size 5.  The  rotation block was pinned into position off this  proximal cut of the  tibia and then exchanged out for the 4-in-1 cutting block.  Anterior,  posterior, and chamfer cuts then made without notching.  Final box cut  was made off the lateral aspect of the distal femur.  I removed  remaining osteophytes from distal femur.   The tibia was then subluxated anteriorly.  The size 5 seemed to fit best  on the cut surface.  Once the tibia was out anteriorly, it was pinned  into position, drilled, and keel punched.  Trial reduction was now  carried out with the 5 femur, 5 tibia, and 12.5 insert.   The 12.5 insert allowed the knee to come to full extension.  The  ligament appeared to be stable from extension into flexion.  The patella  tracked nicely through the trochlea without opposition or pressure.  Given these findings, the final components were opened on the back  field.  The trials were removed.  The synovial capsule junction was  injected with 0.25% Marcaine with epinephrine and Toradol.  The knee was   irrigated with normal saline solution pulse lavage.  Cement was mixed,  and the final components were cemented into position.  The knee was  brought to full extension with 12.5 insert, and extruded cement was  removed.  The tourniquet was let down at 44 minutes.  At this time, we  noted significant bleeding over the lateral aspect of the knee joint  consistent with the middle branch of the lateral geniculate artery.  I  flexed the knee back up and was able to cauterize this area with no  significant hemostasis problems.   When the knee cement had fully cured, excessive cement was removed  throughout the knee.  We then irrigated the knee with normal saline  solution and placed a final 12.5 insert to match the 5 femur.  The knee  was reirrigated with normal saline solution pulse lavage.  A medium  Hemovac drain was placed deep.  The extensor mechanism was  reapproximated with #1 Vicryl.  The remainder of the wound was closed  with 2-0 Vicryl and running 4-0 Monocryl.  The knee was cleaned, dried,  and dressed sterilely with Steri-Strips and a bulky sterile wrap.  He  was then brought to the recovery room in stable condition and tolerated  the procedure well.      Madlyn Frankel Charlann Boxer, M.D.  Electronically Signed     MDO/MEDQ  D:  09/30/2008  T:  09/30/2008  Job:  [id number]

## 2010-06-22 NOTE — Procedures (Signed)
DUPLEX ULTRASOUND OF ABDOMINAL AORTA   INDICATION:  Follow up of known abdominal aortic aneurysm.   HISTORY:  Diabetes:  Yes.  Cardiac:  Yes.  Hypertension:  Yes.  Smoking:  Quit 26 years ago.  Connective Tissue Disorder:  Family History:  No.  Previous Surgery:  No.   DUPLEX EXAM:         AP (cm)                   TRANSVERSE (cm)  Proximal             2.52 cm                   2.67 cm  Mid                  2.26 cm                   3.32 cm  Distal               3.37 cm                   3.86 cm  Right Iliac          Unable to image due to bowel gas  Unable to image due to bowel gas  Left Iliac           Unable to image due to bowel gas  Unable to image due to bowel gas   PREVIOUS:  Date:  AP:  3.32  TRANSVERSE:  3.32   IMPRESSION:  Slight increase in abdominal aortic aneurysm since the last  study done on 07/18/06.   ___________________________________________  Quita Skye. Hart Rochester, M.D.   AC/MEDQ  D:  07/29/2008  T:  07/29/2008  Job:  [id number]

## 2010-06-22 NOTE — Discharge Summary (Signed)
[name], FOLKS NO.:  [id number]   MEDICAL RECORD NO.:  [id number]          PATIENT TYPE:  INP   LOCATION:  1604                         FACILITY:  [facility]   PHYSICIAN:  Madlyn Frankel. Charlann Boxer, M.D.  DATE OF BIRTH:  10/26/1934   DATE OF ADMISSION:  09/30/2008  DATE OF DISCHARGE:  10/02/2008                               DISCHARGE SUMMARY   ADMISSION DIAGNOSES:  1. Osteoarthritis.  2. Hypertension.  3. Cardiomyopathy.  4. Hyperlipidemia.  5. Carotid artery disease.  6. Diabetes.   DISCHARGE DIAGNOSES:  1. Osteoarthritis.  2. Hypertension.  3. Cardiomyopathy.  4. Hyperlipidemia.  5. Carotid artery disease.  6. Diabetes.   HISTORY OF PRESENT ILLNESS:  This is a 76 year old gentleman with a  history of left knee pain secondary to osteoarthritis.  It has been  refractory to all conservative treatment.   CONSULTATION:  None.   PROCEDURE:  Left total knee replacement.   SURGEON:  Madlyn Frankel. Charlann Boxer, M.D.   ASSISTANT:  Yetta Glassman. Mann, PA-C.   LABORATORY DATA:  CBC final reading shows a white blood cell count 7.9,  hematocrit 28.3, platelets 113, metabolic sodium 133, potassium 3.6, BUN  15, creatinine 1.1, glucose 204.   HOSPITAL COURSE:  The patient underwent a left total knee replacement  and admitted to the orthopedic floor.  His stay was unremarkable.  He  remained hemodynamically orthopedically stable throughout his course of  stay.  Dressing was changed on day 2 with no significant drainage from  the wound.  He remained neurovascularly intact to his left lower  extremity throughout.  Weightbearing as tolerated with physical therapy.  He had improving quad function during his course of stay.  By day 2, he  had met all criteria for discharge home in stable improved and  condition.   DISCHARGE DIET:  Heart healthy.   DISCHARGE WOUND CARE:  Keep dry.   DISCHARGE MEDICATIONS:  1. Norco 7.5/325 one-two p.o. q.4-6 h. p.r.n. pain.  2. Robaxin 500 mg  one p.o. q.6 h. p.r.n. muscle spasm and pain.  3. Iron 325 mg p.o. t.i.d. x2 weeks.  4. Colace 100 mg p.o. b.i.d.  5. MiraLax 17 gm p.o. daily.  6. Aspirin 325 mg one p.o. b.i.d. x6 weeks.  7. Celebrex 200 mg one p.o. b.i.d. x2 weeks.  8. Crestor 20 mg p.o. daily.  9. Flomax 0.4 mg p.o. daily.  10.Triamterene/HCTZ 37.5/25 p.o. daily.  11.Benazepril 20 mg half tab p.o. daily.  12.Sertraline 10 mg p.o. p.r.n.  13.Carvedilol 12.5 mg two tablets b.i.d.  14.Glucosamine 1500 mg b.i.d.  15.Fluticasone nasal spray twice a day.  16.Multivitamin daily.  17.Metformin 500 mg p.o. daily.  18.Ocuvite multivitamin daily.   DISCHARGE FOLLOWUP:  Follow up with Dr. Charlann Boxer at phone number [phone] in  two weeks for a wound check.     ______________________________  Yetta Glassman. [address], [place]      Madlyn Frankel. Charlann Boxer, M.D.  Electronically Signed    BLM/MEDQ  D:  10/02/2008  T:  10/02/2008  Job:  [id number]   cc:   Corwin Levins, MD  [address]  [place]  [place] [address]   W. Viann Fish, M.D.  Fax: [phone]  Email: [email or web address]

## 2010-06-22 NOTE — H&P (Signed)
NAME:  [name], [name] NO.:  [id number]   MEDICAL RECORD NO.:  [id number]          PATIENT TYPE:  INP   LOCATION:  NA                           FACILITY:  [facility]   PHYSICIAN:  Madlyn Frankel. Charlann Boxer, M.D.  DATE OF BIRTH:  Jul 19, 1934   DATE OF ADMISSION:  09/30/2008  DATE OF DISCHARGE:                              HISTORY & PHYSICAL   REASON FOR ADMISSION:  A left total knee replacement.   CHIEF COMPLAINT:  Left knee pain.   HISTORY OF PRESENT ILLNESS:  A 76 year old male with a history of left  knee pain secondary to osteoarthritis.  Refractory to all conservative  treatment.  He has a history of a right total knee replacement back in  2009, has done well.   PRIMARY CARE PHYSICIAN:  Dr. Oliver Barre.   CARDIOLOGIST:  Dr. Viann Fish.   PAST MEDICAL HISTORY:  1. Osteoarthritis.  2. Hypertension.  3. Cardiomyopathy, idiopathic.  4. Hyperlipidemia.  5. Carotid artery disease.  6. Diabetes.   PAST SURGICAL HISTORY:  Right total knee replacement in August 2009.   FAMILY HISTORY:  Heart disease.   SOCIAL HISTORY:  Nonsmoker.  Primary caregiver will be his wife in the  home.   DRUG ALLERGIES:  NO KNOWN DRUG ALLERGIES.   MEDICATIONS:  1. Crestor 20 mg p.o. daily.  2. Flomax 0.4 mg p.o. daily.  3. Triamterene/hydrochlorothiazide 25 mg p.o. daily.  4. Benazepril 20 mg p.o. daily.  5. Carvedilol 12.5 mg p.o. daily.  6. Glucosamine daily.  7. Aspirin 325 p.o. daily.  8. Metformin 500 mg p.o. daily.  9. Cetirizine 10 mg p.o. p.r.n.  10.Tylenol Arthritis p.r.n.  11.Celebrex 200 mg p.o. b.i.d. x2 weeks after surgery.   REVIEW OF SYSTEMS:  HEENT:  Hearing loss, wears hearing aids.  MUSCULOSKELETAL:  He has joint swelling and morning stiffness.  Otherwise, see HPI.   PHYSICAL EXAMINATION:  Pulse 72.  Respirations 16.  Blood pressure  116/80.  GENERAL:  Awake, alert, and oriented.  NECK:  Supple.  No carotid bruits.  CHEST/LUNGS:  Clear to auscultation  bilaterally.  HEART:  S1 and S2 distinct.  ABDOMEN:  Soft.  Bowel sounds present.  PELVIS:  Stable.  GENITOURINARY:  Deferred.  EXTREMITIES:  Left knee 0 to 90 degrees of range of motion.  With his  right total knee, 0 to 115 degrees.  SKIN:  No cellulitis.  NEUROLOGIC:  Intact distal sensibilities.   LABORATORY DATA:  Labs, EKG, and chest x-ray all pending presurgical  testing.   IMPRESSION:  Left knee osteoarthritis.   PLAN OF ACTION:  Left total knee replacement by Dr. Charlann Boxer at [facility]  on September 30, 2008.  Risks and complications were discussed.   Postoperative  medications were provided including Lovenox for DVT  prophylaxis followed by aspirin, as well as Celebrex for perioperative  use.     ______________________________  Yetta Glassman. [address], [place]      Madlyn Frankel. Charlann Boxer, M.D.  Electronically Signed    BLM/MEDQ  D:  09/17/2008  T:  09/17/2008  Job:  [id number]   cc:  Corwin Levins, MD  [address]  [place]  [place] [address]   W. Viann Fish, M.D.  Fax: [phone]  Email: [email or web address]   Dr. Hart Rochester

## 2010-06-22 NOTE — Procedures (Signed)
CAROTID DUPLEX EXAM   INDICATION:  Follow-up evaluation of known carotid artery disease.   HISTORY:  Diabetes:  Yes.  Cardiac:  Yes.  Hypertension:  Yes.  Smoking:  Quit 26 years ago.  Previous Surgery:  No.  CV History:  No.  Amaurosis Fugax No, Paresthesias No, Hemiparesis No.                                       RIGHT             LEFT  Brachial systolic pressure:         138               144  Brachial Doppler waveforms:         Biphasic          Biphasic  Vertebral direction of flow:        Antegrade         Antegrade  DUPLEX VELOCITIES (cm/sec)  CCA peak systolic                   68                103  ECA peak systolic                   138               119  ICA peak systolic                   Occluded          168  ICA end diastolic                   Occluded          69  PLAQUE MORPHOLOGY:                  Heterogenous      Heterogenous  PLAQUE AMOUNT:                      Severe            Moderate  PLAQUE LOCATION:                    BIF, ICA          CCA, ICA, ECA    IMPRESSION:  1. Known occluded right internal carotid artery.  2. 40-59% left internal carotid artery stenosis.        ___________________________________________  Quita Skye Hart Rochester, M.D.   AC/MEDQ  D:  07/29/2008  T:  07/29/2008  Job:  [id number]

## 2010-06-22 NOTE — Assessment & Plan Note (Signed)
OFFICE VISIT   [name], [name]  DOB:  [birth year]                                       07/18/2006  CHART#:[id number]   The patient returns today for followup regarding his carotid occlusive  disease and a small abdominal aortic aneurysm.  He has had no abdominal  symptoms and his aneurysm by duplex scanning today is unchanged,  remaining 3.4-cm in maximum diameter.  He has had a known right internal  carotid occlusion and we followed his left internal carotid with mild  occlusive disease but no symptoms.  He takes one aspirin per day.   PHYSICAL EXAMINATION:  Blood pressure 128/82, heart rate is 54,  respiratory rate is 16.  His carotid pulse is a 3+ with no audible  bruits.  Neurologic exam is normal.  No palpable adenopathy in the neck.  Chest clear to auscultation.  His abdomen is obese and no palpable  masses.  He has a 3+ femoral and posterior tibial pulse is palpable  bilaterally.   Carotid duplex exam is unchanged with occlusion of the right internal  carotid artery and mild narrowing of the left internal carotid artery  approximately 40%.   I reassured him regarding these findings, he will return in 2 years with  carotid duplex exam and a duplex of his aortic aneurysm unless he  develops any symptomatology.   Quita Skye Hart Rochester, M.D.  Electronically Signed   JDL/MEDQ  D:  07/18/2006  T:  07/18/2006  Job:  [id number]   cc:   Corwin Levins, MD

## 2010-06-25 NOTE — Discharge Summary (Signed)
Joshua, [name] NO.:  [id number]   MEDICAL RECORD NO.:  [id number]          PATIENT TYPE:  INP   LOCATION:  1612                         FACILITY:  [facility]   PHYSICIAN:  [name], M.D.  DATE OF BIRTH:  07/14/1934   DATE OF ADMISSION:  10/08/2007  DATE OF DISCHARGE:  10/10/2007                               DISCHARGE SUMMARY   ADMITTING DIAGNOSES:  1. Osteoarthritis.  2. Hypertension.  3. Cardiomyopathy.  4. Hyperlipidemia.  5. Carotid artery stenosis.  6. Diabetes.   DISCHARGE DIAGNOSES:  1. Osteoarthritis.  2. Hypertension.  3. Cardiomyopathy.  4. Hyperlipidemia.  5. Carotid artery stenosis.  6. Diabetes.   HISTORY OF PRESENT ILLNESS:  A 76 year old male history of right knee  pain secondary to osteoarthritis refractory to all conservative  treatment, presurgically assessed for right total knee replacement.   CONSULTS:  None.   PROCEDURE:  A right total knee replacement.   SURGEON:  Dr. Durene Romans.   ASSISTANT:  Dwyane Luo, P.A.-C   LABORATORY DATA:  CBC upon admission:  His hematocrit was 39.9,  patient's 145.  At time of discharge:  Hematocrit 25.1, platelets 107.  White cell differential all within normal limits.  Coagulation all  within normal limits.  A routine chemistry on admission:  Sodium 139,  potassium 5.1, glucose 122, creatinine 1.34.  Time of discharge:  Sodium  135, potassium 3.9, glucose 176 and creatinine 1.04.  Final GFR was  greater than 60.  His calcium was 8.  His UA showed a trace of ketones,  otherwise negative for nitrites.  Glucose readings throughout his course  of stay showed mild elevations.  Highest reading was 157.  Predominate  range in the 130s.   Cardiology:  Previous EKG had shown nonspecific T wave changes, had full  cardiology workup with transthoracic echocardiogram.  This is done by  Dr. Oliver Barre.   Radiology:  Chest 2-view showed no acute cardiopulmonary processes.   HOSPITAL COURSE:   Patient underwent a right total knee replacement by  surgeon Dr. Durene Romans, admitted to orthopedic floor.  He remained  hemodynamically stable throughout his course of stay, made excellent  progress.  Hemovac was discontinued on day 1.  Dressing was changed on a  daily basis with no significant drainage from the wound.  He had good  quad function which improved on a daily basis, was able to do straight  leg raise on day 1.  He was weightbearing as tolerated and on Lovenox  for DVT prophylaxis.  By day 2, he was making extra progress and met all  criteria for discharge home.   DISPOSITION:  Discharge home with home health care PT in stable improved  condition.   DISCHARGE DIET:  Regular.   DISCHARGE WOUND CARE:  Keep wound dry.   DISCHARGE MEDICATIONS:  Include:  1. Lovenox 40 mg subcutaneously every 24.  2. Robaxin 500 mg p.o. every 6.  3. Enteric coated aspirin 325 mg p.o. every day.  4. Colace 100 mg p.o. b.i.d.  5. MiraLax 17 g p.o. every day.  6. Vicodin 7.5/325 1-2  p.o. every 4-6 p.r.n. pain.  7. Celebrex 200 mg 1 p.o. b.i.d.  8. Crestor 20 mg p.o. every day.  9. Flomax 0.4 mg p.o. every day.  10.Triamterene and hydrochlorothiazide 37.5/25 one p.o. every day.  11.Benazepril 20 mg p.o. every day.  12.__________ 10 mg p.r.n.  13.Carvedilol 12.5 mg 1/2 tab b.i.d.  14.Glucosamine 1500 b.i.d.  15.Metformin 500 mg p.o. every day.  16.Fluticasone spray p.r.n.   DISCHARGE FOLLOWUP:  Follow with Dr. Charlann [name] at phone number [phone] in 2  weeks for wound care check.     ______________________________  Yetta Glassman. [address], [place]      [name], M.D.  Electronically Signed    BLM/MEDQ  D:  10/29/2007  T:  10/30/2007  Job:  [id number]   cc:   Corwin Levins, MD  [address]  [place]  [place] [address]   W. Viann Fish, M.D.  Fax: [phone]  Email: [email or web address]

## 2010-06-25 NOTE — Op Note (Signed)
NAME:  LEVEN, HOEL                        ACCOUNT NO.:  [id number]   MEDICAL RECORD NO.:  [id number]                   PATIENT TYPE:  AMB   LOCATION:  NESC                                 FACILITY:  [facility]   PHYSICIAN:  Claudette Laws, M.D.               DATE OF BIRTH:  1934/12/05   DATE OF PROCEDURE:  10/16/2003  DATE OF DISCHARGE:                                 OPERATIVE REPORT   PREOPERATIVE DIAGNOSES:  Large right ureteral calculus with renal colic and  hydronephrosis.   POSTOPERATIVE DIAGNOSES:  Large right ureteral calculus with renal colic and  hydronephrosis.   OPERATION:  Cystoscopy and insertion of a 6 French 26 cm double J stent.   SURGEON:  Claudette Laws, M.D.   DESCRIPTION OF PROCEDURE:  The patient was prepped and draped in the dorsal  lithotomy position under LMA anesthesia.  Cystoscopy was performed with the  22 French rigid cystoscope.  He had a normal anterior urethra, rather  prominent lateral lobes with anterior notching and elevation of the  posterior lip, slight elongation of the prostatic urethra. The bladder  itself looked grossly normal, trabeculated +1 to +2 but no tumors, no  calculi, normal ureteral orifices.   Initially we attempted to pass up a 0.038 Glidewire through a 6 [place] open  ended ureteral catheter using fluoroscopic control.  The stone initially was  about L3-L4, it was impacted as we could not get the Glidewire by the stone.  I then injected approximately 5-6 mL of Xylocaine jelly mixed with saline  and eventually we were able to get the Glidewire to bypass the stone.  Then  during the course of the manipulation, the stone did migrate up into the  right renal pelvis it appeared.   I backed out the open ended catheter and passed up a 6 French 26 cm double J  stent again using fluoroscopic control. Appropriate pictures were taken. The  distal end was curled up in the bladder. The bladder was emptied, all  instruments were removed  and Xylocaine jelly was instilled per urethra for  anesthetic purposes.  He was then taken back to the recovery room in  satisfactory condition.                                               Claudette Laws, M.D.    RFS/MEDQ  D:  10/16/2003  T:  10/16/2003  Job:  [id number]

## 2010-07-06 ENCOUNTER — Other Ambulatory Visit: Payer: Self-pay

## 2010-07-06 MED ORDER — METFORMIN HCL 1000 MG PO TABS: 1000.0000 mg | ORAL_TABLET | Freq: Two times a day (BID) | ORAL | Status: DC

## 2010-08-19 ENCOUNTER — Other Ambulatory Visit: Payer: Self-pay | Admitting: Internal Medicine

## 2010-08-24 ENCOUNTER — Other Ambulatory Visit: Payer: Self-pay | Admitting: Internal Medicine

## 2010-08-24 ENCOUNTER — Other Ambulatory Visit: Payer: Self-pay

## 2010-08-24 DIAGNOSIS — Z0000 Encounter for general adult medical examination without abnormal findings: Secondary | ICD-10-CM

## 2010-08-24 DIAGNOSIS — IMO0001 Reserved for inherently not codable concepts without codable children: Secondary | ICD-10-CM

## 2010-08-24 DIAGNOSIS — Z1289 Encounter for screening for malignant neoplasm of other sites: Secondary | ICD-10-CM

## 2010-08-26 ENCOUNTER — Other Ambulatory Visit: Payer: Self-pay | Admitting: Internal Medicine

## 2010-08-26 ENCOUNTER — Other Ambulatory Visit (INDEPENDENT_AMBULATORY_CARE_PROVIDER_SITE_OTHER): Payer: Medicare Other

## 2010-08-26 DIAGNOSIS — Z0000 Encounter for general adult medical examination without abnormal findings: Secondary | ICD-10-CM

## 2010-08-26 DIAGNOSIS — Z125 Encounter for screening for malignant neoplasm of prostate: Secondary | ICD-10-CM

## 2010-08-26 DIAGNOSIS — IMO0001 Reserved for inherently not codable concepts without codable children: Secondary | ICD-10-CM

## 2010-08-26 DIAGNOSIS — Z1289 Encounter for screening for malignant neoplasm of other sites: Secondary | ICD-10-CM

## 2010-08-26 LAB — URINALYSIS
Hgb urine dipstick: NEGATIVE
Nitrite: NEGATIVE
Total Protein, Urine: NEGATIVE
pH: 6 (ref 5.0–8.0)

## 2010-08-26 LAB — CBC WITH DIFFERENTIAL/PLATELET
Basophils Absolute: 0 10*3/uL (ref 0.0–0.1)
Basophils Relative: 0.4 % (ref 0.0–3.0)
HCT: 43.1 % (ref 39.0–52.0)
Hemoglobin: 14.9 g/dL (ref 13.0–17.0)
Lymphs Abs: 2.7 10*3/uL (ref 0.7–4.0)
Monocytes Relative: 5.8 % (ref 3.0–12.0)
Neutro Abs: 4.2 10*3/uL (ref 1.4–7.7)
RBC: 4.58 Mil/uL (ref 4.22–5.81)
RDW: 13.9 % (ref 11.5–14.6)

## 2010-08-26 LAB — BASIC METABOLIC PANEL
CO2: 27 mEq/L (ref 19–32)
Calcium: 9.2 mg/dL (ref 8.4–10.5)
Creatinine, Ser: 1 mg/dL (ref 0.4–1.5)
Glucose, Bld: 152 mg/dL — ABNORMAL HIGH (ref 70–99)

## 2010-08-26 LAB — HEPATIC FUNCTION PANEL
AST: 35 U/L (ref 0–37)
Alkaline Phosphatase: 52 U/L (ref 39–117)
Total Bilirubin: 0.7 mg/dL (ref 0.3–1.2)

## 2010-08-26 LAB — LDL CHOLESTEROL, DIRECT: Direct LDL: 73.3 mg/dL

## 2010-08-26 LAB — MICROALBUMIN / CREATININE URINE RATIO
Creatinine,U: 228.3 mg/dL
Microalb Creat Ratio: 0.3 mg/g (ref 0.0–30.0)

## 2010-08-26 LAB — TSH: TSH: 2.45 u[IU]/mL (ref 0.35–5.50)

## 2010-08-27 ENCOUNTER — Encounter: Payer: Self-pay | Admitting: Internal Medicine

## 2010-08-27 DIAGNOSIS — Z0000 Encounter for general adult medical examination without abnormal findings: Secondary | ICD-10-CM | POA: Insufficient documentation

## 2010-08-31 ENCOUNTER — Encounter: Payer: Self-pay | Admitting: Internal Medicine

## 2010-08-31 ENCOUNTER — Ambulatory Visit (INDEPENDENT_AMBULATORY_CARE_PROVIDER_SITE_OTHER): Payer: Medicare Other | Admitting: Internal Medicine

## 2010-08-31 VITALS — BP 106/80 | HR 60 | Temp 97.7°F | Ht 71.0 in | Wt 278.0 lb

## 2010-08-31 DIAGNOSIS — E119 Type 2 diabetes mellitus without complications: Secondary | ICD-10-CM

## 2010-08-31 DIAGNOSIS — Z0000 Encounter for general adult medical examination without abnormal findings: Secondary | ICD-10-CM

## 2010-08-31 DIAGNOSIS — R21 Rash and other nonspecific skin eruption: Secondary | ICD-10-CM

## 2010-08-31 MED ORDER — SITAGLIPTIN PHOSPHATE 100 MG PO TABS: 100.0000 mg | ORAL_TABLET | Freq: Every day | ORAL | Status: DC

## 2010-08-31 MED ORDER — ROSUVASTATIN CALCIUM 20 MG PO TABS: 20.0000 mg | ORAL_TABLET | Freq: Every day | ORAL | Status: DC

## 2010-08-31 MED ORDER — CLOTRIMAZOLE-BETAMETHASONE 1-0.05 % EX CREA: TOPICAL_CREAM | CUTANEOUS | Status: DC

## 2010-08-31 NOTE — Assessment & Plan Note (Signed)
Left medial ankle  - for med refill

## 2010-08-31 NOTE — Assessment & Plan Note (Signed)

## 2010-08-31 NOTE — Patient Instructions (Addendum)
Continue all other medications as before Please return in 6 mo with Lab testing done 3-5 days before  

## 2010-08-31 NOTE — Progress Notes (Signed)
Subjective:    Patient ID: [name], male    DOB: Sep 09, 1934, 76 y.o.   MRN: [id number]  HPI Here for wellness and f/u;  Overall doing ok;  Pt denies CP, worsening SOB, DOE, wheezing, orthopnea, PND, worsening LE edema, palpitations, dizziness or syncope.  Pt denies neurological change such as new Headache, facial or extremity weakness.  Pt denies polydipsia, polyuria, or low sugar symptoms. Pt states overall good compliance with treatment and medications, good tolerability, and trying to follow lower cholesterol diet.  Pt denies worsening depressive symptoms, suicidal ideation or panic. No fever, wt loss, night sweats, loss of appetite, or other constitutional symptoms.  Pt states good ability with ADL's, low fall risk, home safety reviewed and adequate, no significant changes in hearing or vision, and occasionally active with exercise. Had to stop the coreg due to low BP  .  Plans to lose wt with MD wellness and get off expensive [place] Past Medical History  Diagnosis Date  . ALLERGIC RHINITIS 07/20/2007  . ANXIETY 07/20/2007  . BENIGN PROSTATIC HYPERTROPHY 07/20/2007  . DEPRESSION 07/20/2007  . DIABETES MELLITUS, TYPE II 07/20/2007  . DIVERTICULOSIS, COLON 07/20/2007  . HYPERLIPIDEMIA 07/20/2007  . HYPERTENSION 07/20/2007  . NEPHROLITHIASIS, HX OF 07/20/2007  . PERIPHERAL VASCULAR DISEASE 07/20/2007  . RASH-NONVESICULAR 08/22/2008   Past Surgical History  Procedure Date  . S/p lithotripsy   . S/p bilat knee replacements Left knee aug. 2010    Dr. Charlann Boxer    reports that he has never smoked. He does not have any smokeless tobacco history on file. He reports that he does not drink alcohol or use illicit drugs. family history includes Asthma in his mother; COPD in his mother; Depression in his mother; Heart disease in his mother; Hypertension in his mother; and Kidney disease in his mother. No Known Allergies Current Outpatient Prescriptions on File Prior to Visit  Medication Sig Dispense  Refill  . acetaminophen (TYLENOL) 500 MG tablet Take 500 mg by mouth daily as needed.        Marland Kitchen aspirin 81 MG tablet Take 81 mg by mouth daily.        . benazepril (LOTENSIN) 20 MG tablet Take 20 mg by mouth daily.        . cetirizine (ZYRTEC) 10 MG tablet Take 10 mg by mouth daily.        . clotrimazole-betamethasone (LOTRISONE) cream Apply topically 2 (two) times daily.        . fluticasone (FLONASE) 50 MCG/ACT nasal spray Place 2 sprays into the nose daily.        . Glucosamine-Chondroit-Vit C-Mn (GLUCOSAMINE 1500 COMPLEX PO) 4 by mouth every day       . glucose blood (ONE TOUCH TEST STRIPS) test strip Use as directed once daily       . glucose blood (ONE TOUCH ULTRA TEST) test strip Use as directed once daily       . Lancets MISC Use as directed once daily       . metFORMIN (GLUCOPHAGE) 1000 MG tablet Take 1 tablet (1,000 mg total) by mouth 2 (two) times daily.  180 tablet  3  . Multiple Vitamin (MULTIVITAMIN) capsule Take 1 capsule by mouth daily.        . rosuvastatin (CRESTOR) 20 MG tablet Take 20 mg by mouth daily.        . sildenafil (VIAGRA) 100 MG tablet 1 by mouth every other day as needed       .  sitaGLIPtin (JANUVIA) 100 MG tablet Take 100 mg by mouth daily.        . Tamsulosin HCl (FLOMAX) 0.4 MG CAPS TAKE 1 CAPSULE ONCE DAILY  90 capsule  1  . triamterene-hydrochlorothiazide (DYAZIDE) 37.5-25 MG per capsule Take 1 capsule by mouth daily.         Review of Systems Review of Systems  Constitutional: Negative for diaphoresis, activity change, appetite change and unexpected weight change.  HENT: Negative for hearing loss, ear pain, facial swelling, mouth sores and neck stiffness.   Eyes: Negative for pain, redness and visual disturbance.  Respiratory: Negative for shortness of breath and wheezing.   Cardiovascular: Negative for chest pain and palpitations.  Gastrointestinal: Negative for diarrhea, blood in stool, abdominal distention and rectal pain.  Genitourinary: Negative for  hematuria, flank pain and decreased urine volume.  Musculoskeletal: Negative for myalgias and joint swelling.  Skin: Negative for color change and wound.  Neurological: Negative for syncope and numbness.  Hematological: Negative for adenopathy.  Psychiatric/Behavioral: Negative for hallucinations, self-injury, decreased concentration and agitation.      Objective:   Physical Exam BP 106/80  Pulse 60  Temp(Src) 97.7 F (36.5 C) (Oral)  Ht 5\' 11"  (1.803 m)  Wt 278 lb (126.1 kg)  BMI 38.77 kg/m2  SpO2 95% Physical Exam  VS noted Constitutional: Pt is oriented to person, place, and time. Appears well-developed and well-nourished.  HENT:  Head: Normocephalic and atraumatic.  Right Ear: External ear normal.  Left Ear: External ear normal.  Nose: Nose normal.  Mouth/Throat: Oropharynx is clear and moist.  Eyes: Conjunctivae and EOM are normal. Pupils are equal, round, and reactive to light.  Neck: Normal range of motion. Neck supple. No JVD present. No tracheal deviation present.  Cardiovascular: Normal rate, regular rhythm, normal heart sounds and intact distal pulses.   Pulmonary/Chest: Effort normal and breath sounds normal.  Abdominal: Soft. Bowel sounds are normal. There is no tenderness.  Musculoskeletal: Normal range of motion. Exhibits no edema.  Lymphadenopathy:  Has no cervical adenopathy.  Neurological: Pt is alert and oriented to person, place, and time. Pt has normal reflexes. No cranial nerve deficit.  Skin: Skin is warm and dry. No rash noted. except for 2 cm area left medial ankle eczema Psychiatric:  Has  normal mood and affect. Behavior is normal. 1+ nervous       Assessment & Plan:

## 2010-11-10 LAB — CBC
Hemoglobin: 10 — ABNORMAL LOW
Hemoglobin: 8.7 — ABNORMAL LOW
MCHC: 33.6
MCHC: 34.8
Platelets: 107 — ABNORMAL LOW
Platelets: 121 — ABNORMAL LOW
RDW: 14.2
RDW: 14.5

## 2010-11-10 LAB — GLUCOSE, CAPILLARY
Glucose-Capillary: 130 — ABNORMAL HIGH
Glucose-Capillary: 131 — ABNORMAL HIGH
Glucose-Capillary: 133 — ABNORMAL HIGH

## 2010-11-10 LAB — BASIC METABOLIC PANEL
BUN: 16
CO2: 29
Calcium: 8 — ABNORMAL LOW
Calcium: 8.3 — ABNORMAL LOW
Creatinine, Ser: 1.04
Creatinine, Ser: 1.05
GFR calc non Af Amer: >60
Glucose, Bld: 150 — ABNORMAL HIGH
Glucose, Bld: 176 — ABNORMAL HIGH
Sodium: 135
Sodium: 138

## 2011-03-02 ENCOUNTER — Other Ambulatory Visit (INDEPENDENT_AMBULATORY_CARE_PROVIDER_SITE_OTHER): Payer: Medicare Other

## 2011-03-02 ENCOUNTER — Other Ambulatory Visit: Payer: Self-pay

## 2011-03-02 DIAGNOSIS — E119 Type 2 diabetes mellitus without complications: Secondary | ICD-10-CM

## 2011-03-02 LAB — BASIC METABOLIC PANEL
Chloride: 99 mEq/L (ref 96–112)
GFR: 80.39 mL/min (ref >60.00)
Potassium: 4.2 mEq/L (ref 3.5–5.1)
Sodium: 138 mEq/L (ref 135–145)

## 2011-03-02 LAB — LIPID PANEL
LDL Cholesterol: 56 mg/dL (ref 0–99)
Total CHOL/HDL Ratio: 3
VLDL: 25.8 mg/dL (ref 0.0–40.0)

## 2011-03-02 MED ORDER — TAMSULOSIN HCL 0.4 MG PO CAPS: 0.4000 mg | ORAL_CAPSULE | Freq: Every day | ORAL | Status: DC

## 2011-03-08 ENCOUNTER — Ambulatory Visit (INDEPENDENT_AMBULATORY_CARE_PROVIDER_SITE_OTHER): Payer: Medicare Other | Admitting: Internal Medicine

## 2011-03-08 ENCOUNTER — Encounter: Payer: Self-pay | Admitting: Internal Medicine

## 2011-03-08 VITALS — BP 122/78 | HR 54 | Temp 97.0°F | Ht 71.0 in | Wt 227.0 lb

## 2011-03-08 DIAGNOSIS — E785 Hyperlipidemia, unspecified: Secondary | ICD-10-CM

## 2011-03-08 DIAGNOSIS — Z0000 Encounter for general adult medical examination without abnormal findings: Secondary | ICD-10-CM

## 2011-03-08 DIAGNOSIS — I10 Essential (primary) hypertension: Secondary | ICD-10-CM

## 2011-03-08 DIAGNOSIS — R21 Rash and other nonspecific skin eruption: Secondary | ICD-10-CM

## 2011-03-08 DIAGNOSIS — E119 Type 2 diabetes mellitus without complications: Secondary | ICD-10-CM

## 2011-03-08 MED ORDER — METFORMIN HCL 1000 MG PO TABS: 500.0000 mg | ORAL_TABLET | Freq: Two times a day (BID) | ORAL | Status: DC

## 2011-03-08 MED ORDER — ROSUVASTATIN CALCIUM 20 MG PO TABS: 20.0000 mg | ORAL_TABLET | Freq: Every day | ORAL | Status: DC

## 2011-03-08 MED ORDER — FLUTICASONE PROPIONATE 50 MCG/ACT NA SUSP: 2.0000 | Freq: Every day | NASAL | Status: DC

## 2011-03-08 MED ORDER — LANCETS MISC: 1.0000 "application " | Freq: Every day | Status: DC

## 2011-03-08 MED ORDER — TAMSULOSIN HCL 0.4 MG PO CAPS: 0.4000 mg | ORAL_CAPSULE | Freq: Every day | ORAL | Status: DC

## 2011-03-08 MED ORDER — GLUCOSE BLOOD VI STRP: ORAL_STRIP | Status: DC

## 2011-03-08 MED ORDER — SILDENAFIL CITRATE 100 MG PO TABS: 100.0000 mg | ORAL_TABLET | ORAL | Status: DC | PRN

## 2011-03-08 MED ORDER — CARVEDILOL 12.5 MG PO TABS: 6.2500 mg | ORAL_TABLET | Freq: Two times a day (BID) | ORAL | Status: DC

## 2011-03-08 MED ORDER — CLOTRIMAZOLE-BETAMETHASONE 1-0.05 % EX CREA: TOPICAL_CREAM | CUTANEOUS | Status: DC

## 2011-03-08 NOTE — Assessment & Plan Note (Signed)
stable overall by hx and exam, most recent data reviewed with pt, and pt to continue medical treatment as before  Lab Results  Component Value Date   LDLCALC 56 03/02/2011

## 2011-03-08 NOTE — Assessment & Plan Note (Signed)
stable overall by hx and exam, most recent data reviewed with pt, and pt able to cont reduced med regimen as well  BP Readings from Last 3 Encounters:  03/08/11 122/78  08/31/10 106/80  03/01/10 100/70

## 2011-03-08 NOTE — Assessment & Plan Note (Signed)
stable overall by hx and exam, most recent data reviewed with pt, and pt able to decrease the metformin to 500 bid, and no longer takes the [place] Lab Results  Component Value Date   HGBA1C 5.6 03/02/2011

## 2011-03-08 NOTE — Patient Instructions (Addendum)
OK to decrease the metformin to 500 mg twice per day, and stay off the [place]. OK to stay off the benazepril and fluid pill you were taking for blood pressure Your remaining prescriptions were refilled to [facility] (see med list on this sheet) except for the ones given hardcopy Please return in 6 mo with Lab testing done 3-5 days before

## 2011-03-08 NOTE — Assessment & Plan Note (Signed)
For lotrisone asd refill, to f/u any worsening symptoms or concerns

## 2011-03-08 NOTE — Progress Notes (Signed)
Subjective:    Patient ID: [name], male    DOB: [birth year], 77 y.o.   MRN: [id number]  HPI  Here to f/u; overall doing ok,  Pt denies chest pain, increased sob or doe, wheezing, orthopnea, PND, increased LE swelling, palpitations, dizziness or syncope.  Pt denies new neurological symptoms such as new headache, or facial or extremity weakness or numbness   Pt denies polydipsia, polyuria, but has had mult low sugar symptoms such as weakness or confusion improved with po intake with large intentional wt loss.  Pt states overall good compliance with meds, trying to follow lower cholesterol, diabetic diet and has to stop BP med as well due to fatigue adn lower BP. Lost large wt from 278 approx 6 mo ago, to current 227 with diet plan. Did not take the [place] due to cost.   Pt denies fever, wt loss, night sweats, loss of appetite, or other constitutional symptoms  No other new complaints.  Does need lotrisone refill for recurrent rash, though no rash now Past Medical History  Diagnosis Date  . ALLERGIC RHINITIS 07/20/2007  . ANXIETY 07/20/2007  . BENIGN PROSTATIC HYPERTROPHY 07/20/2007  . DEPRESSION 07/20/2007  . DIABETES MELLITUS, TYPE II 07/20/2007  . DIVERTICULOSIS, COLON 07/20/2007  . HYPERLIPIDEMIA 07/20/2007  . HYPERTENSION 07/20/2007  . NEPHROLITHIASIS, HX OF 07/20/2007  . PERIPHERAL VASCULAR DISEASE 07/20/2007  . RASH-NONVESICULAR 08/22/2008   Past Surgical History  Procedure Date  . S/p lithotripsy   . S/p bilat knee replacements Left knee aug. 2010    Dr. Charlann Boxer    reports that he has never smoked. He does not have any smokeless tobacco history on file. He reports that he does not drink alcohol or use illicit drugs. family history includes Asthma in his mother; COPD in his mother; Depression in his mother; Heart disease in his mother; Hypertension in his mother; and Kidney disease in his mother. No Known Allergies Current Outpatient Prescriptions on File Prior to Visit  Medication Sig  Dispense Refill  . acetaminophen (TYLENOL) 500 MG tablet Take 500 mg by mouth daily as needed.        Marland Kitchen aspirin 81 MG tablet Take 81 mg by mouth daily.        . Glucosamine-Chondroit-Vit C-Mn (GLUCOSAMINE 1500 COMPLEX PO) 4 by mouth every day       . Multiple Vitamin (MULTIVITAMIN) capsule Take 1 capsule by mouth daily.         Review of Systems Review of Systems  Constitutional: Negative for diaphoresis, activity change, appetite change and unexpected weight change.  HENT: Negative for hearing loss, ear pain, facial swelling, mouth sores and neck stiffness.   Eyes: Negative for pain, redness and visual disturbance.  Respiratory: Negative for shortness of breath and wheezing.   Cardiovascular: Negative for chest pain and palpitations.  Gastrointestinal: Negative for diarrhea, blood in stool, abdominal distention and rectal pain.  Genitourinary: Negative for hematuria, flank pain and decreased urine volume.      Objective:   Physical Exam BP 122/78  Pulse 54  Temp(Src) 97 F (36.1 C) (Oral)  Ht 5\' 11"  (1.803 m)  Wt 227 lb (102.967 kg)  BMI 31.66 kg/m2  SpO2 98% Physical Exam  VS noted Constitutional: Pt appears well-developed and well-nourished.  HENT: Head: Normocephalic.  Right Ear: External ear normal.  Left Ear: External ear normal.  Eyes: Conjunctivae and EOM are normal. Pupils are equal, round, and reactive to light.  Neck: Normal range of motion. Neck supple.  Cardiovascular: Normal rate and regular rhythm.   Pulmonary/Chest: Effort normal and breath sounds normal.  Abd:  Soft, NT, non-distended, + BS Neurological: Pt is alert. No cranial nerve deficit.  Skin: Skin is warm. No erythema. No rash Psychiatric: Pt behavior is normal. Thought content normal.     Assessment & Plan:

## 2011-05-24 ENCOUNTER — Other Ambulatory Visit: Payer: Self-pay | Admitting: Internal Medicine

## 2011-08-06 ENCOUNTER — Other Ambulatory Visit: Payer: Self-pay | Admitting: Internal Medicine

## 2011-09-02 ENCOUNTER — Other Ambulatory Visit (INDEPENDENT_AMBULATORY_CARE_PROVIDER_SITE_OTHER): Payer: Medicare Other

## 2011-09-02 DIAGNOSIS — Z0000 Encounter for general adult medical examination without abnormal findings: Secondary | ICD-10-CM

## 2011-09-02 DIAGNOSIS — Z125 Encounter for screening for malignant neoplasm of prostate: Secondary | ICD-10-CM

## 2011-09-02 DIAGNOSIS — E119 Type 2 diabetes mellitus without complications: Secondary | ICD-10-CM

## 2011-09-02 LAB — URINALYSIS, ROUTINE W REFLEX MICROSCOPIC
Bilirubin Urine: NEGATIVE
Hgb urine dipstick: NEGATIVE
Ketones, ur: NEGATIVE
Leukocytes, UA: NEGATIVE
Nitrite: NEGATIVE
Urobilinogen, UA: 0.2 (ref 0.0–1.0)

## 2011-09-02 LAB — CBC WITH DIFFERENTIAL/PLATELET
Basophils Absolute: 0 10*3/uL (ref 0.0–0.1)
Eosinophils Relative: 4.8 % (ref 0.0–5.0)
HCT: 41 % (ref 39.0–52.0)
Lymphs Abs: 2.2 10*3/uL (ref 0.7–4.0)
MCV: 94.7 fl (ref 78.0–100.0)
Monocytes Absolute: 0.4 10*3/uL (ref 0.1–1.0)
Neutrophils Relative %: 56.2 % (ref 43.0–77.0)
Platelets: 120 10*3/uL — ABNORMAL LOW (ref 150.0–400.0)
RDW: 13.6 % (ref 11.5–14.6)
WBC: 6.7 10*3/uL (ref 4.5–10.5)

## 2011-09-02 LAB — BASIC METABOLIC PANEL
BUN: 19 mg/dL (ref 6–23)
Chloride: 108 mEq/L (ref 96–112)
GFR: 83.24 mL/min (ref >60.00)
Glucose, Bld: 112 mg/dL — ABNORMAL HIGH (ref 70–99)
Potassium: 4.9 mEq/L (ref 3.5–5.1)
Sodium: 141 mEq/L (ref 135–145)

## 2011-09-02 LAB — TSH: TSH: 1.66 u[IU]/mL (ref 0.35–5.50)

## 2011-09-02 LAB — HEPATIC FUNCTION PANEL
ALT: 16 U/L (ref 0–53)
AST: 17 U/L (ref 0–37)
Albumin: 4.1 g/dL (ref 3.5–5.2)
Alkaline Phosphatase: 43 U/L (ref 39–117)
Total Bilirubin: 0.7 mg/dL (ref 0.3–1.2)

## 2011-09-02 LAB — LIPID PANEL: Cholesterol: 126 mg/dL (ref 0–200)

## 2011-09-02 LAB — MICROALBUMIN / CREATININE URINE RATIO
Creatinine,U: 130.5 mg/dL
Microalb, Ur: 1.1 mg/dL (ref 0.0–1.9)

## 2011-09-02 LAB — PSA: PSA: 1.41 ng/mL (ref 0.10–4.00)

## 2011-09-02 LAB — HEMOGLOBIN A1C: Hgb A1c MFr Bld: 5.7 % (ref 4.6–6.5)

## 2011-09-06 ENCOUNTER — Encounter: Payer: Self-pay | Admitting: Internal Medicine

## 2011-09-06 ENCOUNTER — Ambulatory Visit (INDEPENDENT_AMBULATORY_CARE_PROVIDER_SITE_OTHER): Payer: Medicare Other | Admitting: Internal Medicine

## 2011-09-06 VITALS — BP 110/80 | HR 51 | Temp 97.4°F | Ht 71.0 in | Wt 230.4 lb

## 2011-09-06 DIAGNOSIS — E119 Type 2 diabetes mellitus without complications: Secondary | ICD-10-CM

## 2011-09-06 DIAGNOSIS — Z0000 Encounter for general adult medical examination without abnormal findings: Secondary | ICD-10-CM

## 2011-09-06 MED ORDER — GLUCOSE BLOOD VI STRP: ORAL_STRIP | Status: DC

## 2011-09-06 MED ORDER — CLOTRIMAZOLE-BETAMETHASONE 1-0.05 % EX CREA: TOPICAL_CREAM | CUTANEOUS | Status: DC

## 2011-09-06 MED ORDER — METFORMIN HCL 1000 MG PO TABS: 500.0000 mg | ORAL_TABLET | Freq: Every day | ORAL | Status: DC

## 2011-09-06 NOTE — Assessment & Plan Note (Signed)
With recent wt loss and good a1c, ok to decr the metformin by half,  to f/u any worsening symptoms or concerns

## 2011-09-06 NOTE — Assessment & Plan Note (Signed)

## 2011-09-06 NOTE — Patient Instructions (Addendum)
OK to decrease the metformin to 1000 mg - 1/2 in the AM only Continue all other medications as before Your refills were done as requested today You are other wise up to date with prevention today Please continue your efforts at being more active, low cholesterol diet, and weight control. Please return in 6 mo with Lab testing done 3-5 days before

## 2011-09-06 NOTE — Progress Notes (Signed)
Subjective:    Patient ID: [name], male    DOB: [birth year], 77 y.o.   MRN: [id number]  HPI  Here for wellness and f/u;  Overall doing ok;  Pt denies CP, worsening SOB, DOE, wheezing, orthopnea, PND, worsening LE edema, palpitations, dizziness or syncope.  Pt denies neurological change such as new Headache, facial or extremity weakness.  Pt denies polydipsia, polyuria, or low sugar symptoms. Pt states overall good compliance with treatment and medications, good tolerability, and trying to follow lower cholesterol diet.  Pt denies worsening depressive symptoms, suicidal ideation or panic. No fever, wt loss, night sweats, loss of appetite, or other constitutional symptoms.  Pt states good ability with ADL's, low fall risk, home safety reviewed and adequate, no significant changes in hearing or vision, and occasionally active with exercise.  Has lost wt over 30 lbs with better diet, now down to 230.  Trying to lose more.  No acute complaints Past Medical History  Diagnosis Date  . ALLERGIC RHINITIS 07/20/2007  . ANXIETY 07/20/2007  . BENIGN PROSTATIC HYPERTROPHY 07/20/2007  . DEPRESSION 07/20/2007  . DIABETES MELLITUS, TYPE II 07/20/2007  . DIVERTICULOSIS, COLON 07/20/2007  . HYPERLIPIDEMIA 07/20/2007  . HYPERTENSION 07/20/2007  . NEPHROLITHIASIS, HX OF 07/20/2007  . PERIPHERAL VASCULAR DISEASE 07/20/2007  . RASH-NONVESICULAR 08/22/2008   Past Surgical History  Procedure Date  . S/p lithotripsy   . S/p bilat knee replacements Left knee aug. 2010    Dr. Charlann Boxer    reports that he has never smoked. He does not have any smokeless tobacco history on file. He reports that he does not drink alcohol or use illicit drugs. family history includes Asthma in his mother; COPD in his mother; Depression in his mother; Heart disease in his mother; Hypertension in his mother; and Kidney disease in his mother. No Known Allergies Current Outpatient Prescriptions on File Prior to Visit  Medication Sig Dispense  Refill  . acetaminophen (TYLENOL) 500 MG tablet Take 500 mg by mouth daily as needed.        Marland Kitchen ALL DAY ALLERGY 10 MG tablet TAKE 1 TABLET BY MOUTH EVERY DAY  30 tablet  11  . aspirin 81 MG tablet Take 81 mg by mouth daily.        . carvedilol (COREG) 12.5 MG tablet Take 0.5 tablets (6.25 mg total) by mouth 2 (two) times daily with a meal. 1/2 by mouth two times a day  180 tablet  3  . fluticasone (FLONASE) 50 MCG/ACT nasal spray Place 2 sprays into the nose daily.  48 g  3  . Glucosamine-Chondroit-Vit C-Mn (GLUCOSAMINE 1500 COMPLEX PO) 4 by mouth every day       . Lancets MISC Apply 1 application topically daily. Use as directed once daily  100 each  3  . Multiple Vitamin (MULTIVITAMIN) capsule Take 1 capsule by mouth daily.        . rosuvastatin (CRESTOR) 20 MG tablet Take 1 tablet (20 mg total) by mouth daily.  90 tablet  3  . sildenafil (VIAGRA) 100 MG tablet Take 1 tablet (100 mg total) by mouth as needed.  10 tablet  5  . Tamsulosin HCl (FLOMAX) 0.4 MG CAPS Take 1 capsule (0.4 mg total) by mouth daily.  90 capsule  3  . DISCONTD: metFORMIN (GLUCOPHAGE) 1000 MG tablet Take 0.5 tablets (500 mg total) by mouth 2 (two) times daily.  90 tablet  3   Review of Systems Review of Systems  Constitutional: Negative for  diaphoresis, activity change, appetite change and unexpected weight change.  HENT: Negative for hearing loss, ear pain, facial swelling, mouth sores and neck stiffness.   Eyes: Negative for pain, redness and visual disturbance.  Respiratory: Negative for shortness of breath and wheezing.   Cardiovascular: Negative for chest pain and palpitations.  Gastrointestinal: Negative for diarrhea, blood in stool, abdominal distention and rectal pain.  Genitourinary: Negative for hematuria, flank pain and decreased urine volume.  Musculoskeletal: Negative for myalgias and joint swelling.  Skin: Negative for color change and wound.  Neurological: Negative for syncope and numbness.    Hematological: Negative for adenopathy.  Psychiatric/Behavioral: Negative for hallucinations, self-injury, decreased concentration and agitation.      Objective:   Physical Exam BP 110/80  Pulse 51  Temp 97.4 F (36.3 C) (Oral)  Ht 5\' 11"  (1.803 m)  Wt 230 lb 6 oz (104.497 kg)  BMI 32.13 kg/m2  SpO2 96% Physical Exam  VS noted Constitutional: Pt is oriented to person, place, and time. Appears well-developed and well-nourished.  HENT:  Head: Normocephalic and atraumatic.  Right Ear: External ear normal.  Left Ear: External ear normal.  Nose: Nose normal.  Mouth/Throat: Oropharynx is clear and moist.  Eyes: Conjunctivae and EOM are normal. Pupils are equal, round, and reactive to light.  Neck: Normal range of motion. Neck supple. No JVD present. No tracheal deviation present.  Cardiovascular: Normal rate, regular rhythm, normal heart sounds and intact distal pulses.   Pulmonary/Chest: Effort normal and breath sounds normal.  Abdominal: Soft. Bowel sounds are normal. There is no tenderness.  Musculoskeletal: Normal range of motion. Exhibits no edema.  Lymphadenopathy:  Has no cervical adenopathy.  Neurological: Pt is alert and oriented to person, place, and time. Pt has normal reflexes. No cranial nerve deficit.  Skin: Skin is warm and dry. No rash noted.  Psychiatric:  Has  normal mood and affect. Behavior is normal.     Assessment & Plan:

## 2012-03-05 ENCOUNTER — Other Ambulatory Visit (INDEPENDENT_AMBULATORY_CARE_PROVIDER_SITE_OTHER): Payer: Medicare Other

## 2012-03-05 DIAGNOSIS — E119 Type 2 diabetes mellitus without complications: Secondary | ICD-10-CM

## 2012-03-05 LAB — LIPID PANEL: Cholesterol: 142 mg/dL (ref 0–200)

## 2012-03-05 LAB — BASIC METABOLIC PANEL
BUN: 27 mg/dL — ABNORMAL HIGH (ref 6–23)
Chloride: 99 mEq/L (ref 96–112)
Glucose, Bld: 145 mg/dL — ABNORMAL HIGH (ref 70–99)
Potassium: 4.6 mEq/L (ref 3.5–5.1)
Sodium: 138 mEq/L (ref 135–145)

## 2012-03-05 LAB — HEMOGLOBIN A1C: Hgb A1c MFr Bld: 6.3 % (ref 4.6–6.5)

## 2012-03-09 ENCOUNTER — Ambulatory Visit (INDEPENDENT_AMBULATORY_CARE_PROVIDER_SITE_OTHER): Payer: Medicare Other | Admitting: Internal Medicine

## 2012-03-09 ENCOUNTER — Telehealth: Payer: Self-pay

## 2012-03-09 ENCOUNTER — Encounter: Payer: Self-pay | Admitting: Internal Medicine

## 2012-03-09 VITALS — BP 118/76 | HR 54 | Temp 97.0°F | Ht 71.0 in | Wt 242.5 lb

## 2012-03-09 DIAGNOSIS — E119 Type 2 diabetes mellitus without complications: Secondary | ICD-10-CM

## 2012-03-09 DIAGNOSIS — I10 Essential (primary) hypertension: Secondary | ICD-10-CM

## 2012-03-09 DIAGNOSIS — E785 Hyperlipidemia, unspecified: Secondary | ICD-10-CM

## 2012-03-09 DIAGNOSIS — Z0000 Encounter for general adult medical examination without abnormal findings: Secondary | ICD-10-CM

## 2012-03-09 MED ORDER — ROSUVASTATIN CALCIUM 40 MG PO TABS: 40.0000 mg | ORAL_TABLET | Freq: Every day | ORAL | Status: DC

## 2012-03-09 MED ORDER — TRIAMTERENE-HCTZ 50-25 MG PO CAPS: 1.0000 | ORAL_CAPSULE | ORAL | Status: DC

## 2012-03-09 NOTE — Patient Instructions (Addendum)
Please stop the triam/hct at 2 per day (the 37.5/25 mg) OK to change to the triam/hct 50/25 mg - at 1 per day Please continue all other medications as before, and refills have been done if requested. Please have the pharmacy call with any other refills you may need. Please continue your efforts at being more active, low cholesterol diabetic diet, and weight control. Thank you for enrolling in MyChart. Please follow the instructions below to securely access your online medical record. MyChart allows you to send messages to your doctor, view your test results, renew your prescriptions, schedule appointments, and more. To Log into My Chart online, please go by [employer] or [employer] to [email or web address], or download the MyChart App from the [employer] of [employer] .  Your Username is: [email or web address] ([email or web address]) Please send a practice Message on Mychart later today. Please return in 6 months, or sooner if needed, with Lab testing done 3-5 days before

## 2012-03-09 NOTE — Telephone Encounter (Signed)
Triamterene-HCTZ 50-25 not on formulary please advise

## 2012-03-09 NOTE — Telephone Encounter (Signed)
Ok to let pt know the triam/hct 50/25 is nonformulary   I can change to the medications with separate prescription - triamterene 50 qd, and HCT 25 qd  - is this ok with him?  This would be 2 separate rx with 2 copays (both generic though)

## 2012-03-10 ENCOUNTER — Encounter: Payer: Self-pay | Admitting: Internal Medicine

## 2012-03-10 NOTE — Assessment & Plan Note (Addendum)
stable overall by history and exam, recent data reviewed with pt, and pt to continue medical treatment as before except to change the 2 qd of the triam/hct 37.5/25 to the 50/25 mg qd,  to f/u any worsening symptoms or concerns BP Readings from Last 3 Encounters:  03/09/12 118/76  09/06/11 110/80  03/08/11 122/78

## 2012-03-10 NOTE — Progress Notes (Signed)
Subjective:    Patient ID: [name], male    DOB: February 15, 1934, 78 y.o.   MRN: [id number]  HPI  Here to f/u; overall doing ok,  Pt denies chest pain, increased sob or doe, wheezing, orthopnea, PND, increased LE swelling, palpitations, dizziness or syncope.  Pt denies polydipsia, polyuria, or low sugar symptoms such as weakness or confusion improved with po intake.  Pt denies new neurological symptoms such as new headache, or facial or extremity weakness or numbness.   Pt states overall good compliance with meds, has been trying to follow lower cholesterol, diabetic diet, with wt overall stable,  but little exercise however.  Unfortunately gained 12 lbs over the holidays due to dietary indiscretion, plans to do better.  BP has been increased at home, so he increased the diuretic triam/hct/37.5/25 to 2 per day.  Asks for crestor rx at 40 mg due to cost, would only take 1/2 per day Past Medical History  Diagnosis Date  . ALLERGIC RHINITIS 07/20/2007  . ANXIETY 07/20/2007  . BENIGN PROSTATIC HYPERTROPHY 07/20/2007  . DEPRESSION 07/20/2007  . DIABETES MELLITUS, TYPE II 07/20/2007  . DIVERTICULOSIS, COLON 07/20/2007  . HYPERLIPIDEMIA 07/20/2007  . HYPERTENSION 07/20/2007  . NEPHROLITHIASIS, HX OF 07/20/2007  . PERIPHERAL VASCULAR DISEASE 07/20/2007  . RASH-NONVESICULAR 08/22/2008   Past Surgical History  Procedure Date  . S/p lithotripsy   . S/p bilat knee replacements Left knee aug. 2010    Dr. Charlann Boxer    reports that he has never smoked. He does not have any smokeless tobacco history on file. He reports that he does not drink alcohol or use illicit drugs. family history includes Asthma in his mother; COPD in his mother; Depression in his mother; Heart disease in his mother; Hypertension in his mother; and Kidney disease in his mother. No Known Allergies Current Outpatient Prescriptions on File Prior to Visit  Medication Sig Dispense Refill  . acetaminophen (TYLENOL) 500 MG tablet Take 500 mg by  mouth daily as needed.        Marland Kitchen ALL DAY ALLERGY 10 MG tablet TAKE 1 TABLET BY MOUTH EVERY DAY  30 tablet  11  . aspirin 81 MG tablet Take 81 mg by mouth daily.        . carvedilol (COREG) 12.5 MG tablet Take 0.5 tablets (6.25 mg total) by mouth 2 (two) times daily with a meal. 1/2 by mouth two times a day  180 tablet  3  . clotrimazole-betamethasone (LOTRISONE) cream USE AS DIRECTED TWICE A DAY AS NEEDED  15 g  3  . fluticasone (FLONASE) 50 MCG/ACT nasal spray Place 2 sprays into the nose daily.  48 g  3  . Glucosamine-Chondroit-Vit C-Mn (GLUCOSAMINE 1500 COMPLEX PO) 4 by mouth every day       . glucose blood (ONE TOUCH ULTRA TEST) test strip Use as directed once daily  100 each  11  . Lancets MISC Apply 1 application topically daily. Use as directed once daily  100 each  3  . metFORMIN (GLUCOPHAGE) 1000 MG tablet Take 0.5 tablets (500 mg total) by mouth daily with breakfast.  90 tablet  3  . Multiple Vitamin (MULTIVITAMIN) capsule Take 1 capsule by mouth daily.        . sildenafil (VIAGRA) 100 MG tablet Take 1 tablet (100 mg total) by mouth as needed.  10 tablet  5  . Tamsulosin HCl (FLOMAX) 0.4 MG CAPS Take 1 capsule (0.4 mg total) by mouth daily.  90 capsule  3  . triamterene-hydrochlorothiazide (DYAZIDE) 50-25 MG per capsule Take 1 capsule by mouth every morning.  90 capsule  3   Review of Systems  Constitutional: Negative for unexpected weight change, or unusual diaphoresis  HENT: Negative for tinnitus.   Eyes: Negative for photophobia and visual disturbance.  Respiratory: Negative for choking and stridor.   Gastrointestinal: Negative for vomiting and blood in stool.  Genitourinary: Negative for hematuria and decreased urine volume.  Musculoskeletal: Negative for acute joint swelling Skin: Negative for color change and wound.  Neurological: Negative for tremors and numbness other than noted  Psychiatric/Behavioral: Negative for decreased concentration or  hyperactivity.        Objective:   Physical Exam BP 118/76  Pulse 54  Temp 97 F (36.1 C) (Oral)  Ht 5\' 11"  (1.803 m)  Wt 242 lb 8 oz (109.997 kg)  BMI 33.82 kg/m2  SpO2 97% VS noted,  Constitutional: Pt appears well-developed and well-nourished.  HENT: Head: NCAT.  Right Ear: External ear normal.  Left Ear: External ear normal.  Eyes: Conjunctivae and EOM are normal. Pupils are equal, round, and reactive to light.  Neck: Normal range of motion. Neck supple.  Cardiovascular: Normal rate and regular rhythm.   Pulmonary/Chest: Effort normal and breath sounds normal.  Abd:  Soft, NT, non-distended, + BS Neurological: Pt is alert. Not confused  Skin: Skin is warm. No erythema. No LE edema Psychiatric: Pt behavior is normal. Thought content normal.     Assessment & Plan:

## 2012-03-10 NOTE — Assessment & Plan Note (Signed)
stable overall by history and exam, recent data reviewed with pt, and pt to continue medical treatment as before,  to f/u any worsening symptoms or concerns Lab Results  Component Value Date   LDLCALC 68 03/05/2012

## 2012-03-10 NOTE — Assessment & Plan Note (Signed)
Lab Results  Component Value Date   HGBA1C 6.3 03/05/2012   stable overall by history and exam despite the recent wt gain, recent data reviewed with pt, and pt to continue medical treatment as before,  to f/u any worsening symptoms or concerns, to cont to work on diet and wt loss

## 2012-03-12 MED ORDER — TRIAMTERENE 50 MG PO CAPS: 50.0000 mg | ORAL_CAPSULE | Freq: Two times a day (BID) | ORAL | Status: DC

## 2012-03-12 MED ORDER — HYDROCHLOROTHIAZIDE 25 MG PO TABS: 25.0000 mg | ORAL_TABLET | Freq: Every day | ORAL | Status: DC

## 2012-03-12 NOTE — Telephone Encounter (Signed)
Called left message to call back 

## 2012-03-12 NOTE — Telephone Encounter (Signed)
rx's changed - done erx

## 2012-03-12 NOTE — Telephone Encounter (Signed)
Patient informed of change and he is ok to change.  Pharmacy is [facility]

## 2012-03-26 ENCOUNTER — Telehealth: Payer: Self-pay

## 2012-03-26 MED ORDER — METFORMIN HCL 1000 MG PO TABS: 1000.0000 mg | ORAL_TABLET | Freq: Two times a day (BID) | ORAL | Status: DC

## 2012-03-26 NOTE — Telephone Encounter (Signed)
Patient called requesting a refill of Metformin to [facility] stated he is now taking BID please advise

## 2012-03-26 NOTE — Telephone Encounter (Signed)
The patient stated he is taking Metformin 1000mg  BID

## 2012-03-26 NOTE — Telephone Encounter (Signed)
Called the patient

## 2012-03-26 NOTE — Telephone Encounter (Signed)
Need to clarify - is he taking 500 mg or 1000mg  bid?

## 2012-03-26 NOTE — Telephone Encounter (Signed)
rx done erx 

## 2012-03-27 ENCOUNTER — Other Ambulatory Visit: Payer: Self-pay | Admitting: Internal Medicine

## 2012-04-06 ENCOUNTER — Telehealth: Payer: Self-pay

## 2012-04-06 MED ORDER — CARVEDILOL 12.5 MG PO TABS: 6.2500 mg | ORAL_TABLET | Freq: Two times a day (BID) | ORAL | Status: DC

## 2012-04-06 NOTE — Telephone Encounter (Signed)
refill 

## 2012-04-19 ENCOUNTER — Other Ambulatory Visit: Payer: Self-pay | Admitting: *Deleted

## 2012-04-19 ENCOUNTER — Telehealth: Payer: Self-pay

## 2012-04-19 DIAGNOSIS — I714 Abdominal aortic aneurysm, without rupture: Secondary | ICD-10-CM

## 2012-04-19 MED ORDER — CARVEDILOL 12.5 MG PO TABS: 6.2500 mg | ORAL_TABLET | Freq: Two times a day (BID) | ORAL | Status: DC

## 2012-04-19 NOTE — Telephone Encounter (Signed)
I dont see where this was intended last visit.  Ok to cont the coreg at 1/2 tab bid, and I sent corrected rx

## 2012-04-19 NOTE — Telephone Encounter (Signed)
Pt called stating he was advised by Dr Jonny Ruiz to increase Carvedilol from 1/2 tab BID to 1 tab BID. Please verify increase so that new Rx can be sent to pt's local pharmacy, thanks!

## 2012-04-19 NOTE — Telephone Encounter (Signed)
Patient informed of MD instructions. 

## 2012-05-01 ENCOUNTER — Ambulatory Visit: Payer: Medicare Other | Admitting: Neurosurgery

## 2012-05-01 ENCOUNTER — Other Ambulatory Visit: Payer: Medicare Other

## 2012-05-04 ENCOUNTER — Ambulatory Visit: Payer: Medicare Other | Admitting: Neurosurgery

## 2012-05-04 ENCOUNTER — Other Ambulatory Visit: Payer: Medicare Other

## 2012-06-29 ENCOUNTER — Other Ambulatory Visit: Payer: Self-pay | Admitting: Internal Medicine

## 2012-07-11 ENCOUNTER — Ambulatory Visit: Payer: Medicare Other | Admitting: Internal Medicine

## 2012-07-19 ENCOUNTER — Ambulatory Visit (INDEPENDENT_AMBULATORY_CARE_PROVIDER_SITE_OTHER): Payer: Medicare Other | Admitting: Internal Medicine

## 2012-07-19 ENCOUNTER — Other Ambulatory Visit (INDEPENDENT_AMBULATORY_CARE_PROVIDER_SITE_OTHER): Payer: Medicare Other

## 2012-07-19 ENCOUNTER — Encounter: Payer: Self-pay | Admitting: Internal Medicine

## 2012-07-19 VITALS — BP 112/72 | HR 56 | Temp 97.0°F | Ht 71.0 in | Wt 255.1 lb

## 2012-07-19 DIAGNOSIS — E119 Type 2 diabetes mellitus without complications: Secondary | ICD-10-CM

## 2012-07-19 DIAGNOSIS — I10 Essential (primary) hypertension: Secondary | ICD-10-CM

## 2012-07-19 DIAGNOSIS — E785 Hyperlipidemia, unspecified: Secondary | ICD-10-CM

## 2012-07-19 DIAGNOSIS — Z0000 Encounter for general adult medical examination without abnormal findings: Secondary | ICD-10-CM

## 2012-07-19 LAB — BASIC METABOLIC PANEL
BUN: 14 mg/dL (ref 6–23)
Creatinine, Ser: 1 mg/dL (ref 0.4–1.5)
GFR: 82.03 mL/min (ref >60.00)

## 2012-07-19 LAB — CBC WITH DIFFERENTIAL/PLATELET
Basophils Relative: 0.2 % (ref 0.0–3.0)
Eosinophils Relative: 4.6 % (ref 0.0–5.0)
HCT: 41.3 % (ref 39.0–52.0)
Lymphs Abs: 2.7 10*3/uL (ref 0.7–4.0)
MCV: 96.4 fl (ref 78.0–100.0)
Monocytes Absolute: 0.4 10*3/uL (ref 0.1–1.0)
RBC: 4.28 Mil/uL (ref 4.22–5.81)
WBC: 7.5 10*3/uL (ref 4.5–10.5)

## 2012-07-19 LAB — HEPATIC FUNCTION PANEL: Total Bilirubin: 0.8 mg/dL (ref 0.3–1.2)

## 2012-07-19 LAB — LIPID PANEL
LDL Cholesterol: 48 mg/dL (ref 0–99)
Total CHOL/HDL Ratio: 3

## 2012-07-19 LAB — MICROALBUMIN / CREATININE URINE RATIO
Creatinine,U: 139.7 mg/dL
Microalb, Ur: 2.2 mg/dL — ABNORMAL HIGH (ref 0.0–1.9)

## 2012-07-19 MED ORDER — TAMSULOSIN HCL 0.4 MG PO CAPS: 0.4000 mg | ORAL_CAPSULE | Freq: Every day | ORAL | Status: DC

## 2012-07-19 NOTE — Progress Notes (Signed)
Subjective:    Patient ID: [name], male    DOB: 20-May-1934, 78 y.o.   MRN: [id number]  HPI Here to f/u; overall doing ok,  Pt denies chest pain, increased sob or doe, wheezing, orthopnea, PND, increased LE swelling, palpitations, dizziness or syncope.  Pt denies polydipsia, polyuria, or low sugar symptoms such as weakness or confusion improved with po intake.  Pt denies new neurological symptoms such as new headache, or facial or extremity weakness or numbness.   Pt states overall good compliance with meds, has been trying to follow lower cholesterol diet, with wt overall stable,  but little exercise however. Past Medical History  Diagnosis Date  . ALLERGIC RHINITIS 07/20/2007  . ANXIETY 07/20/2007  . BENIGN PROSTATIC HYPERTROPHY 07/20/2007  . DEPRESSION 07/20/2007  . DIABETES MELLITUS, TYPE II 07/20/2007  . DIVERTICULOSIS, COLON 07/20/2007  . HYPERLIPIDEMIA 07/20/2007  . HYPERTENSION 07/20/2007  . NEPHROLITHIASIS, HX OF 07/20/2007  . PERIPHERAL VASCULAR DISEASE 07/20/2007  . RASH-NONVESICULAR 08/22/2008   Past Surgical History  Procedure Laterality Date  . S/p lithotripsy    . S/p bilat knee replacements  Left knee aug. 2010    Dr. Charlann Boxer    reports that he has never smoked. He does not have any smokeless tobacco history on file. He reports that he does not drink alcohol or use illicit drugs. family history includes Asthma in his mother; COPD in his mother; Depression in his mother; Heart disease in his mother; Hypertension in his mother; and Kidney disease in his mother. No Known Allergies Current Outpatient Prescriptions on File Prior to Visit  Medication Sig Dispense Refill  . acetaminophen (TYLENOL) 500 MG tablet Take 500 mg by mouth daily as needed.        Marland Kitchen ALL DAY ALLERGY 10 MG tablet TAKE 1 TABLET BY MOUTH EVERY DAY  30 tablet  11  . aspirin 81 MG tablet Take 81 mg by mouth daily.        . carvedilol (COREG) 12.5 MG tablet Take 0.5 tablets (6.25 mg total) by mouth 2 (two)  times daily with a meal. 1/2 by mouth two times a day  90 tablet  3  . clotrimazole-betamethasone (LOTRISONE) cream USE AS DIRECTED TWICE A DAY AS NEEDED  15 g  3  . fluticasone (FLONASE) 50 MCG/ACT nasal spray Place 2 sprays into the nose daily.  48 g  3  . Glucosamine-Chondroit-Vit C-Mn (GLUCOSAMINE 1500 COMPLEX PO) 4 by mouth every day       . glucose blood (ONE TOUCH ULTRA TEST) test strip Use as directed once daily  100 each  11  . hydrochlorothiazide (HYDRODIURIL) 25 MG tablet Take 1 tablet (25 mg total) by mouth daily.  90 tablet  3  . Lancets MISC Apply 1 application topically daily. Use as directed once daily  100 each  3  . metFORMIN (GLUCOPHAGE) 1000 MG tablet TAKE 1 TABLET BY MOUTH TWICE A DAY  180 tablet  2  . Multiple Vitamin (MULTIVITAMIN) capsule Take 1 capsule by mouth daily.        . rosuvastatin (CRESTOR) 40 MG tablet Take 1 tablet (40 mg total) by mouth daily.  90 tablet  3  . sildenafil (VIAGRA) 100 MG tablet Take 1 tablet (100 mg total) by mouth as needed.  10 tablet  5  . triamterene (DYRENIUM) 50 MG capsule Take 1 capsule (50 mg total) by mouth 2 (two) times daily.  90 capsule  3   No current facility-administered medications on  file prior to visit.   Review of Systems  Constitutional: Negative for unexpected weight change, or unusual diaphoresis  HENT: Negative for tinnitus.   Eyes: Negative for photophobia and visual disturbance.  Respiratory: Negative for choking and stridor.   Gastrointestinal: Negative for vomiting and blood in stool.  Genitourinary: Negative for hematuria and decreased urine volume.  Musculoskeletal: Negative for acute joint swelling Skin: Negative for color change and wound.  Neurological: Negative for tremors and numbness other than noted  Psychiatric/Behavioral: Negative for decreased concentration or  hyperactivity.       Objective:   Physical Exam BP 112/72  Pulse 56  Temp(Src) 97 F (36.1 C) (Oral)  Ht 5\' 11"  (1.803 m)  Wt 255 lb  2 oz (115.724 kg)  BMI 35.6 kg/m2  SpO2 97% VS noted,  Constitutional: Pt appears well-developed and well-nourished.  HENT: Head: NCAT.  Right Ear: External ear normal.  Left Ear: External ear normal.  Eyes: Conjunctivae and EOM are normal. Pupils are equal, round, and reactive to light.  Neck: Normal range of motion. Neck supple.  Cardiovascular: Normal rate and regular rhythm.   Pulmonary/Chest: Effort normal and breath sounds normal.  Abd:  Soft, NT, non-distended, + BS Neurological: Pt is alert. Not confused , mod to severe HOH Skin: Skin is warm. No erythema.  Psychiatric: Pt behavior is normal. Thought content normal.     Assessment & Plan:

## 2012-07-19 NOTE — Assessment & Plan Note (Signed)
stable overall by history and exam, recent data reviewed with pt, and pt to continue medical treatment as before,  to f/u any worsening symptoms or concerns BP Readings from Last 3 Encounters:  07/19/12 112/72  03/09/12 118/76  09/06/11 110/80

## 2012-07-19 NOTE — Patient Instructions (Addendum)
Please continue all other medications as before, and refills have been done if requested. Your form will be filled out and faxed later today Please go to the LAB in the Basement (turn left off the elevator) for the tests to be done today You will be contacted by phone if any changes need to be made immediately.  Otherwise, you will receive a letter about your results with an explanation, but please check with MyChart first.   Thank you for enrolling in MyChart. Please follow the instructions below to securely access your online medical record. MyChart allows you to send messages to your doctor, view your test results, renew your prescriptions, schedule appointments, and more.  OK to cancel the appointment for Aug 6  Please return in 9 months, or sooner if needed,

## 2012-07-19 NOTE — Assessment & Plan Note (Signed)
stable overall by history and exam, recent data reviewed with pt, and pt to continue medical treatment as before,  to f/u any worsening symptoms or concerns Lab Results  Component Value Date   LDLCALC 48 07/19/2012

## 2012-07-19 NOTE — Assessment & Plan Note (Signed)
stable overall by history and exam, recent data reviewed with pt, and pt to continue medical treatment as before,  to f/u any worsening symptoms or concerns Lab Results  Component Value Date   HGBA1C 6.1 07/19/2012

## 2012-07-23 ENCOUNTER — Encounter: Payer: Self-pay | Admitting: Vascular Surgery

## 2012-07-24 ENCOUNTER — Ambulatory Visit (INDEPENDENT_AMBULATORY_CARE_PROVIDER_SITE_OTHER): Payer: Medicare Other | Admitting: Vascular Surgery

## 2012-07-24 ENCOUNTER — Encounter: Payer: Self-pay | Admitting: Vascular Surgery

## 2012-07-24 ENCOUNTER — Encounter (INDEPENDENT_AMBULATORY_CARE_PROVIDER_SITE_OTHER): Payer: Medicare Other | Admitting: *Deleted

## 2012-07-24 ENCOUNTER — Other Ambulatory Visit (INDEPENDENT_AMBULATORY_CARE_PROVIDER_SITE_OTHER): Payer: Medicare Other | Admitting: *Deleted

## 2012-07-24 DIAGNOSIS — I714 Abdominal aortic aneurysm, without rupture, unspecified: Secondary | ICD-10-CM

## 2012-07-24 DIAGNOSIS — I6529 Occlusion and stenosis of unspecified carotid artery: Secondary | ICD-10-CM

## 2012-07-24 DIAGNOSIS — I6523 Occlusion and stenosis of bilateral carotid arteries: Secondary | ICD-10-CM | POA: Insufficient documentation

## 2012-07-24 HISTORY — DX: Abdominal aortic aneurysm, without rupture: I71.4

## 2012-07-24 HISTORY — DX: Abdominal aortic aneurysm, without rupture, unspecified: I71.40

## 2012-07-24 NOTE — Progress Notes (Signed)
Subjective:     Patient ID: [name], male   DOB: [birth year], 78 y.o.   MRN: [id number]  HPI this 78 year old male has been followed for carotid occlusive disease and a small aortic aneurysm. He denies any neurologic symptoms such as lateralizing weakness, aphasia, and necrosis U-Ject, diplopia, blurred vision, or syncope. He is endoabdominal her back symptoms. He denies claudication.  Past Medical History  Diagnosis Date  . ALLERGIC RHINITIS 07/20/2007  . ANXIETY 07/20/2007  . BENIGN PROSTATIC HYPERTROPHY 07/20/2007  . DEPRESSION 07/20/2007  . DIABETES MELLITUS, TYPE II 07/20/2007  . DIVERTICULOSIS, COLON 07/20/2007  . HYPERLIPIDEMIA 07/20/2007  . HYPERTENSION 07/20/2007  . NEPHROLITHIASIS, HX OF 07/20/2007  . PERIPHERAL VASCULAR DISEASE 07/20/2007  . RASH-NONVESICULAR 08/22/2008    History  Substance Use Topics  . Smoking status: Former Smoker    Types: Cigarettes    Quit date: 07/25/1982  . Smokeless tobacco: Never Used  . Alcohol Use: No    Family History  Problem Relation Age of Onset  . Heart disease Mother     CHF  . COPD Mother   . Kidney disease Mother   . Hypertension Mother   . Asthma Mother   . Depression Mother     No Known Allergies  Current outpatient prescriptions:acetaminophen (TYLENOL) 500 MG tablet, Take 500 mg by mouth daily as needed.  , Disp: , Rfl: ;  ALL DAY ALLERGY 10 MG tablet, TAKE 1 TABLET BY MOUTH EVERY DAY, Disp: 30 tablet, Rfl: 11;  aspirin 81 MG tablet, Take 81 mg by mouth daily.  , Disp: , Rfl: ;  carvedilol (COREG) 12.5 MG tablet, Take 0.5 tablets (6.25 mg total) by mouth 2 (two) times daily with a meal. 1/2 by mouth two times a day, Disp: 90 tablet, Rfl: 3 clotrimazole-betamethasone (LOTRISONE) cream, USE AS DIRECTED TWICE A DAY AS NEEDED, Disp: 15 g, Rfl: 3;  fluticasone (FLONASE) 50 MCG/ACT nasal spray, Place 2 sprays into the nose daily., Disp: 48 g, Rfl: 3;  Glucosamine-Chondroit-Vit C-Mn (GLUCOSAMINE 1500 COMPLEX PO), 4 by mouth every  day, Disp: , Rfl: ;  glucose blood (ONE TOUCH ULTRA TEST) test strip, Use as directed once daily, Disp: 100 each, Rfl: 11 hydrochlorothiazide (HYDRODIURIL) 25 MG tablet, Take 1 tablet (25 mg total) by mouth daily., Disp: 90 tablet, Rfl: 3;  Lancets MISC, Apply 1 application topically daily. Use as directed once daily, Disp: 100 each, Rfl: 3;  metFORMIN (GLUCOPHAGE) 1000 MG tablet, TAKE 1 TABLET BY MOUTH TWICE A DAY, Disp: 180 tablet, Rfl: 2;  Multiple Vitamin (MULTIVITAMIN) capsule, Take 1 capsule by mouth daily.  , Disp: , Rfl:  rosuvastatin (CRESTOR) 40 MG tablet, Take 1 tablet (40 mg total) by mouth daily., Disp: 90 tablet, Rfl: 3;  sildenafil (VIAGRA) 100 MG tablet, Take 1 tablet (100 mg total) by mouth as needed., Disp: 10 tablet, Rfl: 5;  tamsulosin (FLOMAX) 0.4 MG CAPS, Take 1 capsule (0.4 mg total) by mouth daily., Disp: 90 capsule, Rfl: 3;  triamterene (DYRENIUM) 50 MG capsule, Take 1 capsule (50 mg total) by mouth 2 (two) times daily., Disp: 90 capsule, Rfl: 3  BP 132/85  Pulse 54  Resp 18  Ht 5\' 11"  (1.803 m)  Wt 253 lb (114.76 kg)  BMI 35.3 kg/m2  Body mass index is 35.3 kg/(m^2).          Review of Systems denies chest pain, dyspnea on exertion, PND, orthopnea. Complains of varicose veins. All of systems negative and complete review of systems  Objective:   Physical Exam blood pressure 130/85 heart rate 64 respirations 18 Gen.-alert and oriented x3 in no apparent distress HEENT normal for age Lungs no rhonchi or wheezing Cardiovascular regular rhythm no murmurs carotid pulses 3+ palpable no bruits audible Abdomen soft nontender no palpable masses Musculoskeletal free of  major deformities Skin clear -no rashes Neurologic normal Lower extremities 3+ femoral and dorsalis pedis pulses palpable bilaterally with no edema  Data ordered a carotid duplex exam which I reviewed and interpreted. We he has a known right ICA occlusion and has minimal stenosis in his left ICA. I  also ordered a duplex scan of his abdominal aorta which I reviewed and interpreted. Aneurysm has a maximum diameter currently of 3.7 cm unchanged from previous studies.    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

## 2012-07-24 NOTE — Addendum Note (Signed)
Addended by: Adria Dill L on: 07/24/2012 04:19 PM   Modules accepted: Orders

## 2012-07-25 ENCOUNTER — Other Ambulatory Visit: Payer: Self-pay | Admitting: *Deleted

## 2012-08-24 ENCOUNTER — Other Ambulatory Visit: Payer: Self-pay | Admitting: Urology

## 2012-08-27 ENCOUNTER — Encounter (HOSPITAL_BASED_OUTPATIENT_CLINIC_OR_DEPARTMENT_OTHER): Payer: Self-pay | Admitting: *Deleted

## 2012-08-27 NOTE — Progress Notes (Signed)
NPO AFTER MN. ARRIVES AT 1015. NEEDS ISTAT . CURRENT EKG TO FAXED FROM DR Donnie Aho. WILL TAKE COREG AND CRESTOR AM OF SURG W/ SIP OF WATER.

## 2012-08-29 ENCOUNTER — Encounter (HOSPITAL_BASED_OUTPATIENT_CLINIC_OR_DEPARTMENT_OTHER): Payer: Self-pay | Admitting: *Deleted

## 2012-08-29 NOTE — H&P (Signed)
ctive Problems Problems  1. Balanitis 607.1 2. Benign Prostatic Hypertrophy With Urinary Obstruction 600.01 3. Phimosis 605  History of Present Illness  [name] returns today with progressive phimosis.  He can retract the skin.  He is voiding ok but he has some restriction.  He can have some burning at times.   He is on tamsulosin for BPH with BOO.   Past Medical History Problems  1. History of  Arthritis V13.4 2. History of  Benign Prostatic Hypertrophy 600.00 3. History of  Diabetes Mellitus 250.00 4. History of  Hypercholesterolemia 272.0 5. History of  Hypertension 401.9 6. History of  Nephrolithiasis V13.01  Surgical History Problems  1. History of  Cataract Surgery Left 2. History of  Knee Replacement Right 3. History of  Knee Replacement Left 4. History of  Lithotripsy  Current Meds 1. Aspirin 81 MG Oral Tablet; Therapy: (Recorded:07Oct2009) to 2. Carvedilol 12.5 MG Oral Tablet; Therapy: 21Jun2011 to 3. Clotrimazole-Betamethasone 1-0.05 % External Cream; Therapy: 30Jul2013 to 4. Crestor TABS; Therapy: (Recorded:07Oct2009) to 5. Fluticasone Propionate 50 MCG/ACT Nasal Suspension; Therapy: 19Sep2011 to 6. Glucosamine CAPS; Therapy: (Recorded:07Oct2009) to 7. MetFORMIN HCl TABS; Therapy: (Recorded:07Oct2009) to 8. Nystatin-Triamcinolone 100000-0.1 UNIT/GM-% External Cream; APPLY SPARINGLY TO  AFFECTED AREA(S) 3 TIMES A DAY; Therapy: 07Oct2009 to (Evaluate:05Apr2014)  Requested  for: 08Aug2013; Last Rx:08Aug2013 9. Tamsulosin HCl 0.4 MG Oral Capsule; TAKE 1 CAPSULE EVERY DAY; Therapy: 03Jan2011 to  (Evaluate:03Aug2014)  Requested for: 08Aug2013; Last Rx:08Aug2013  Allergies Medication  1. No Known Drug Allergies  Family History Problems  1. Family history of  Death In The Family Father died age 19-ALS 2. Family history of  Death In The Family Mother died age 23-heart disease 3. Family history of  Family Health Status Number Of Children 3 daughters  Social  History Problems  1. Caffeine Use 2. Former Smoker smoked 1 ppd for 20 yrs and quit 25 yrs ago 3. Marital History - Currently Married 4. Occupation: retired-[employer] Denied  5. History of  Alcohol Use   Past and social history reviewed and updated.   Review of Systems  Cardiovascular: no chest pain.  Respiratory: no shortness of breath.    Vitals Vital Signs [Data Includes: Last 1 Day]  16Jul2014 02:13PM  BMI Calculated: 34.3 BSA Calculated: 2.3 Height: 5 ft 11 in Weight: 245 lb  Blood Pressure: 116 / 72 Temperature: 98 F Heart Rate: 57  Physical Exam Constitutional: Well nourished and well developed . No acute distress.  Pulmonary: No respiratory distress and normal respiratory rhythm and effort.  Cardiovascular: Heart rate and rhythm are normal . No peripheral edema.  Genitourinary: Examination of the penis demonstrates phimosis (that is now moderate to severe and the skin is no longer retractable. ) and balanitis (minimal. ), but no discharge, no masses, no lesions and a normal meatus. The penis is uncircumcised. The scrotum is without lesions. The right epididymis is palpably normal and non-tender. The left epididymis is palpably normal and non-tender. The right testis is non-tender and without masses. The left testis is non-tender and without masses.    Results/Data Urine [Data Includes: Last 1 Day]   16Jul2014  COLOR YELLOW   APPEARANCE CLEAR   SPECIFIC GRAVITY 1.025   pH 5.5   GLUCOSE NEG mg/dL  BILIRUBIN NEG   KETONE NEG mg/dL  BLOOD TRACE   PROTEIN NEG mg/dL  UROBILINOGEN 0.2 mg/dL  NITRITE NEG   LEUKOCYTE ESTERASE TRACE   SQUAMOUS EPITHELIAL/HPF NONE SEEN   WBC 3-6 WBC/hpf  RBC 3-6 RBC/hpf  BACTERIA RARE   CRYSTALS NONE SEEN   CASTS NONE SEEN    Assessment Assessed  1. Phimosis 605 2. Balanitis 607.1 3. Benign Prostatic Hypertrophy With Urinary Obstruction 600.01   He has progressive phimosis and now needs circumcision.  He is voiding  ok on flomax. The urine has some pyuria and hematuria today but it is from the balanitis most likely.   Plan Health Maintenance (V70.0)  1. UA With REFLEX  Done: 16Jul2014 02:02PM Phimosis (605)  2. Follow-up Schedule Surgery Office  Follow-up  Requested for: 16Jul2014   I will set him up for circumcision and reviewed the risks of bleeding, infection, penile scarring and injury, skin tethering, thrombotic events and anesthetic complications.

## 2012-08-30 ENCOUNTER — Encounter (HOSPITAL_BASED_OUTPATIENT_CLINIC_OR_DEPARTMENT_OTHER): Payer: Self-pay | Admitting: *Deleted

## 2012-08-30 ENCOUNTER — Encounter (HOSPITAL_BASED_OUTPATIENT_CLINIC_OR_DEPARTMENT_OTHER): Payer: Self-pay | Admitting: Anesthesiology

## 2012-08-30 ENCOUNTER — Ambulatory Visit (HOSPITAL_BASED_OUTPATIENT_CLINIC_OR_DEPARTMENT_OTHER): Payer: Medicare Other | Admitting: Anesthesiology

## 2012-08-30 ENCOUNTER — Ambulatory Visit (HOSPITAL_BASED_OUTPATIENT_CLINIC_OR_DEPARTMENT_OTHER)
Admission: RE | Admit: 2012-08-30 | Discharge: 2012-08-30 | Disposition: A | Discharge status: Home / Self Care | Payer: Medicare Other | Source: Ambulatory Visit | Attending: Urology | Admitting: Urology

## 2012-08-30 ENCOUNTER — Encounter (HOSPITAL_BASED_OUTPATIENT_CLINIC_OR_DEPARTMENT_OTHER)
Admission: RE | Disposition: A | Discharge status: Home / Self Care | Payer: Self-pay | Source: Ambulatory Visit | Attending: Urology

## 2012-08-30 DIAGNOSIS — E669 Obesity, unspecified: Secondary | ICD-10-CM | POA: Insufficient documentation

## 2012-08-30 DIAGNOSIS — Z79899 Other long term (current) drug therapy: Secondary | ICD-10-CM | POA: Insufficient documentation

## 2012-08-30 DIAGNOSIS — Z7982 Long term (current) use of aspirin: Secondary | ICD-10-CM | POA: Insufficient documentation

## 2012-08-30 DIAGNOSIS — I739 Peripheral vascular disease, unspecified: Secondary | ICD-10-CM | POA: Insufficient documentation

## 2012-08-30 DIAGNOSIS — N471 Phimosis: Secondary | ICD-10-CM | POA: Insufficient documentation

## 2012-08-30 DIAGNOSIS — I10 Essential (primary) hypertension: Secondary | ICD-10-CM | POA: Insufficient documentation

## 2012-08-30 DIAGNOSIS — N138 Other obstructive and reflux uropathy: Secondary | ICD-10-CM | POA: Insufficient documentation

## 2012-08-30 DIAGNOSIS — N139 Obstructive and reflux uropathy, unspecified: Secondary | ICD-10-CM | POA: Insufficient documentation

## 2012-08-30 DIAGNOSIS — N476 Balanoposthitis: Secondary | ICD-10-CM | POA: Insufficient documentation

## 2012-08-30 DIAGNOSIS — N401 Enlarged prostate with lower urinary tract symptoms: Secondary | ICD-10-CM | POA: Insufficient documentation

## 2012-08-30 DIAGNOSIS — E119 Type 2 diabetes mellitus without complications: Secondary | ICD-10-CM | POA: Insufficient documentation

## 2012-08-30 DIAGNOSIS — E7800 Pure hypercholesterolemia, unspecified: Secondary | ICD-10-CM | POA: Insufficient documentation

## 2012-08-30 DIAGNOSIS — N478 Other disorders of prepuce: Secondary | ICD-10-CM | POA: Insufficient documentation

## 2012-08-30 HISTORY — DX: Occlusion and stenosis of right carotid artery: I65.21

## 2012-08-30 HISTORY — DX: Abdominal aortic aneurysm, without rupture: I71.4

## 2012-08-30 HISTORY — DX: Personal history of other diseases of the circulatory system: Z86.79

## 2012-08-30 HISTORY — DX: Phimosis: N47.1

## 2012-08-30 HISTORY — DX: Benign prostatic hyperplasia without lower urinary tract symptoms: N40.0

## 2012-08-30 HISTORY — DX: Presence of external hearing-aid: Z97.4

## 2012-08-30 HISTORY — DX: Personal history of urinary calculi: Z87.442

## 2012-08-30 HISTORY — DX: Peripheral vascular disease, unspecified: I73.9

## 2012-08-30 HISTORY — DX: Occlusion and stenosis of left carotid artery: I65.22

## 2012-08-30 HISTORY — DX: Allergic rhinitis, unspecified: J30.9

## 2012-08-30 HISTORY — PX: CIRCUMCISION: SHX1350

## 2012-08-30 HISTORY — DX: Hyperlipidemia, unspecified: E78.5

## 2012-08-30 HISTORY — DX: Abdominal aortic aneurysm, without rupture, unspecified: I71.40

## 2012-08-30 HISTORY — DX: Diverticulosis of large intestine without perforation or abscess without bleeding: K57.30

## 2012-08-30 HISTORY — DX: Essential (primary) hypertension: I10

## 2012-08-30 HISTORY — DX: Type 2 diabetes mellitus without complications: E11.9

## 2012-08-30 LAB — POCT I-STAT 4, (NA,K, GLUC, HGB,HCT)
Glucose, Bld: 122 mg/dL — ABNORMAL HIGH (ref 70–99)
HCT: 41 % (ref 39.0–52.0)
Potassium: 4.6 mEq/L (ref 3.5–5.1)
Sodium: 141 mEq/L (ref 135–145)

## 2012-08-30 SURGERY — CIRCUMCISION, ADULT
Anesthesia: Monitor Anesthesia Care | Site: Penis | Wound class: Clean Contaminated

## 2012-08-30 MED ORDER — LIDOCAINE HCL (CARDIAC) 20 MG/ML IV SOLN
INTRAVENOUS | Status: DC | PRN
  Administered 2012-08-30: 50 mg via INTRAVENOUS

## 2012-08-30 MED ORDER — ONDANSETRON HCL 4 MG/2ML IJ SOLN
4.0000 mg | Freq: Four times a day (QID) | INTRAMUSCULAR | Status: DC | PRN
  Filled 2012-08-30: qty 2

## 2012-08-30 MED ORDER — OXYCODONE HCL 5 MG PO TABS
5.0000 mg | ORAL_TABLET | ORAL | Status: DC | PRN
  Filled 2012-08-30: qty 2

## 2012-08-30 MED ORDER — FENTANYL CITRATE 0.05 MG/ML IJ SOLN
INTRAMUSCULAR | Status: DC | PRN
  Administered 2012-08-30: 50 ug via INTRAVENOUS

## 2012-08-30 MED ORDER — HYDROCODONE-ACETAMINOPHEN 5-325 MG PO TABS: 1.0000 | ORAL_TABLET | Freq: Four times a day (QID) | ORAL | Status: DC | PRN

## 2012-08-30 MED ORDER — FENTANYL CITRATE 0.05 MG/ML IJ SOLN
25.0000 ug | INTRAMUSCULAR | Status: DC | PRN
  Filled 2012-08-30: qty 1

## 2012-08-30 MED ORDER — SODIUM CHLORIDE 0.9 % IV SOLN
250.0000 mL | INTRAVENOUS | Status: DC | PRN
  Filled 2012-08-30: qty 250

## 2012-08-30 MED ORDER — BUPIVACAINE HCL (PF) 0.25 % IJ SOLN
INTRAMUSCULAR | Status: DC | PRN
  Administered 2012-08-30: 9 mL

## 2012-08-30 MED ORDER — ACETAMINOPHEN 650 MG RE SUPP
650.0000 mg | RECTAL | Status: DC | PRN
  Filled 2012-08-30: qty 1

## 2012-08-30 MED ORDER — LIDOCAINE HCL (PF) 1 % IJ SOLN
INTRAMUSCULAR | Status: DC | PRN
  Administered 2012-08-30: 9 mL

## 2012-08-30 MED ORDER — LACTATED RINGERS IV SOLN
INTRAVENOUS | Status: DC
  Administered 2012-08-30 (×2): via INTRAVENOUS
  Filled 2012-08-30: qty 1000

## 2012-08-30 MED ORDER — SODIUM CHLORIDE 0.9 % IJ SOLN
3.0000 mL | INTRAMUSCULAR | Status: DC | PRN
  Filled 2012-08-30: qty 3

## 2012-08-30 MED ORDER — PROPOFOL 10 MG/ML IV EMUL
INTRAVENOUS | Status: DC | PRN
  Administered 2012-08-30: 75 ug/kg/min via INTRAVENOUS

## 2012-08-30 MED ORDER — ONDANSETRON HCL 4 MG/2ML IJ SOLN
INTRAMUSCULAR | Status: DC | PRN
  Administered 2012-08-30: 4 mg via INTRAVENOUS

## 2012-08-30 MED ORDER — SODIUM CHLORIDE 0.9 % IJ SOLN
3.0000 mL | Freq: Two times a day (BID) | INTRAMUSCULAR | Status: DC
  Filled 2012-08-30: qty 3

## 2012-08-30 MED ORDER — ACETAMINOPHEN 325 MG PO TABS
650.0000 mg | ORAL_TABLET | ORAL | Status: DC | PRN
  Filled 2012-08-30: qty 2

## 2012-08-30 SURGICAL SUPPLY — 31 items
BANDAGE CO FLEX L/F 2IN X 5YD (GAUZE/BANDAGES/DRESSINGS) ×2 IMPLANT
BANDAGE CONFORM 2  STR LF (GAUZE/BANDAGES/DRESSINGS) ×2 IMPLANT
BANDAGE CONFORM 2X5YD N/S (GAUZE/BANDAGES/DRESSINGS) ×2 IMPLANT
BLADE SURG 15 STRL LF DISP TIS (BLADE) ×1 IMPLANT
BLADE SURG 15 STRL SS (BLADE) ×2
BNDG COHESIVE 1X5 TAN STRL LF (GAUZE/BANDAGES/DRESSINGS) ×2 IMPLANT
CLEANER CAUTERY TIP 5X5 PAD (MISCELLANEOUS) ×1 IMPLANT
CLOTH BEACON ORANGE TIMEOUT ST (SAFETY) ×2 IMPLANT
COVER MAYO STAND STRL (DRAPES) ×2 IMPLANT
COVER TABLE BACK 60X90 (DRAPES) ×2 IMPLANT
DRAPE PED LAPAROTOMY (DRAPES) ×2 IMPLANT
ELECT NEEDLE TIP 2.8 STRL (NEEDLE) IMPLANT
ELECT REM PT RETURN 9FT ADLT (ELECTROSURGICAL) ×2
ELECTRODE REM PT RTRN 9FT ADLT (ELECTROSURGICAL) ×1 IMPLANT
GAUZE XEROFORM 1X8 LF (GAUZE/BANDAGES/DRESSINGS) ×2 IMPLANT
GLOVE BIOGEL PI IND STRL 7.5 (GLOVE) ×1 IMPLANT
GLOVE BIOGEL PI INDICATOR 7.5 (GLOVE) ×1
GLOVE INDICATOR 7.5 STRL GRN (GLOVE) ×2 IMPLANT
GLOVE SURG SS PI 8.0 STRL IVOR (GLOVE) ×2 IMPLANT
GOWN PREVENTION PLUS LG XLONG (DISPOSABLE) ×2 IMPLANT
GOWN STRL REIN XL XLG (GOWN DISPOSABLE) ×2 IMPLANT
NEEDLE HYPO 25X1 1.5 SAFETY (NEEDLE) ×2 IMPLANT
NS IRRIG 500ML POUR BTL (IV SOLUTION) ×2 IMPLANT
PACK BASIN DAY SURGERY FS (CUSTOM PROCEDURE TRAY) ×2 IMPLANT
PAD CLEANER CAUTERY TIP 5X5 (MISCELLANEOUS) ×1
PENCIL BUTTON HOLSTER BLD 10FT (ELECTRODE) ×2 IMPLANT
SUT CHROMIC 4 0 PS 2 18 (SUTURE) ×4 IMPLANT
SYR CONTROL 10ML LL (SYRINGE) ×2 IMPLANT
TOWEL OR 17X24 6PK STRL BLUE (TOWEL DISPOSABLE) ×2 IMPLANT
TRAY DSU PREP LF (CUSTOM PROCEDURE TRAY) ×2 IMPLANT
WATER STERILE IRR 500ML POUR (IV SOLUTION) IMPLANT

## 2012-08-30 NOTE — Anesthesia Preprocedure Evaluation (Addendum)
Anesthesia Evaluation  Patient identified by MRN, date of birth, ID band Patient awake  General Assessment Comment:1. History of  Arthritis V13.4 2. History of  Benign Prostatic Hypertrophy 600.00 3. History of  Diabetes Mellitus 250.00 4. History of  Hypercholesterolemia 272.0 5. History of  Hypertension 401.9 6. History of  Nephrolithiasis V13.01   Reviewed: Allergy & Precautions, H&P , NPO status , Patient's Chart, lab work & pertinent test results  Airway Mallampati: II TM Distance: >3 FB Neck ROM: Full    Dental no notable dental hx.    Pulmonary neg pulmonary ROS, former smoker,  breath sounds clear to auscultation  Pulmonary exam normal       Cardiovascular hypertension, Pt. on medications and Pt. on home beta blockers + Peripheral Vascular Disease Rhythm:Regular Rate:Normal  3.7 cm AAA. Bilateral carotid artery stenosis. Sees Dr. Hart Rochester (last visit 07-24-12 reviewed.)  Visit Dr. Donnie Aho 10-04-11 reviewed. EF 45-50%   Neuro/Psych PSYCHIATRIC DISORDERS Anxiety Depression Bilateral hearing aids.    GI/Hepatic negative GI ROS, Neg liver ROS,   Endo/Other  diabetes, Type 2, Oral Hypoglycemic Agents  Renal/GU negative Renal ROS  negative genitourinary   Musculoskeletal negative musculoskeletal ROS (+)   Abdominal (+) + obese,   Peds negative pediatric ROS (+)  Hematology negative hematology ROS (+)   Anesthesia Other Findings   Reproductive/Obstetrics negative OB ROS                        Anesthesia Physical Anesthesia Plan  ASA: III  Anesthesia Plan: MAC   Post-op Pain Management:    Induction: Intravenous  Airway Management Planned:   Additional Equipment:   Intra-op Plan:   Post-operative Plan: Extubation in OR  Informed Consent: I have reviewed the patients History and Physical, chart, labs and discussed the procedure including the risks, benefits and alternatives for the  proposed anesthesia with the patient or authorized representative who has indicated his/her understanding and acceptance.   Dental advisory given  Plan Discussed with: CRNA  Anesthesia Plan Comments: (MAC/penile block with general backup.)      Anesthesia Quick Evaluation

## 2012-08-30 NOTE — Interval H&P Note (Signed)
History and Physical Interval Note:  08/30/2012 11:33 AM  [name]  has presented today for surgery, with the diagnosis of Phimosis  The various methods of treatment have been discussed with the patient and family. After consideration of risks, benefits and other options for treatment, the patient has consented to  Procedure(s): CIRCUMCISION ADULT (N/A) as a surgical intervention .  The patient's history has been reviewed, patient examined, no change in status, stable for surgery.  I have reviewed the patient's chart and labs.  Questions were answered to the patient's satisfaction.     Lyall Faciane J

## 2012-08-30 NOTE — Transfer of Care (Signed)
Immediate Anesthesia Transfer of Care Note  Patient: [name]  Procedure(s) Performed: Procedure(s): CIRCUMCISION ADULT (N/A)  Patient Location: PACU  Anesthesia Type:MAC  Level of Consciousness: awake, oriented and patient cooperative  Airway & Oxygen Therapy: Patient Spontanous Breathing and Patient connected to face mask oxygen  Post-op Assessment: Report given to PACU RN and Post -op Vital signs reviewed and stable  Post vital signs: Reviewed and stable  Complications: No apparent anesthesia complications

## 2012-08-30 NOTE — Brief Op Note (Signed)
08/30/2012  12:06 PM  PATIENT:  [name]  78 y.o. male  PRE-OPERATIVE DIAGNOSIS:  Phimosis  POST-OPERATIVE DIAGNOSIS:  Phimosis  PROCEDURE:  Procedure(s): CIRCUMCISION ADULT (N/A)  SURGEON:  Surgeon(s) and Role:    * Anner Crete, MD - Primary  PHYSICIAN ASSISTANT:   ASSISTANTS: none   ANESTHESIA:   local and MAC  EBL:  Total I/O In: 200 [I.V.:200] Out: -   BLOOD ADMINISTERED:none  DRAINS: none   LOCAL MEDICATIONS USED:  MARCAINE    and LIDOCAINE   SPECIMEN:  No Specimen  DISPOSITION OF SPECIMEN:  N/A  COUNTS:  YES  TOURNIQUET:  * No tourniquets in log *  DICTATION: .Other Dictation: Dictation Number [id number]  PLAN OF CARE: Discharge to home after PACU  PATIENT DISPOSITION:  PACU - hemodynamically stable.   Delay start of Pharmacological VTE agent (>24hrs) due to surgical blood loss or risk of bleeding: not applicable

## 2012-08-30 NOTE — Anesthesia Postprocedure Evaluation (Signed)
Anesthesia Post Note  Patient: [name]  Procedure(s) Performed: Procedure(s) (LRB): CIRCUMCISION ADULT (N/A)  Anesthesia type: MAC  Patient location: PACU  Post pain: Pain level controlled  Post assessment: Post-op Vital signs reviewed  Last Vitals:  Filed Vitals:   08/30/12 1338  BP: 143/78  Pulse: 51  Temp: 36.1 C  Resp: 16    Post vital signs: Reviewed  Level of consciousness: sedated  Complications: No apparent anesthesia complications

## 2012-08-31 ENCOUNTER — Encounter (HOSPITAL_BASED_OUTPATIENT_CLINIC_OR_DEPARTMENT_OTHER): Payer: Self-pay | Admitting: Urology

## 2012-08-31 NOTE — Op Note (Signed)
[name], [name]:  [id number]  MEDICAL RECORD NO.:  [id number]  LOCATION:                                 FACILITY:  PHYSICIAN:  Excell Seltzer. Annabell Howells, M.D.    DATE OF BIRTH:  1934/02/18  DATE OF PROCEDURE:  08/30/2012 DATE OF DISCHARGE:                              OPERATIVE REPORT   PROCEDURE:  Circumcision.  PREOPERATIVE DIAGNOSIS:  Phimosis.  POSTOPERATIVE DIAGNOSIS:  Phimosis.  SURGEON:  Excell Seltzer. Annabell Howells, M.D.  ANESTHESIA:  MAC and local.  SPECIMEN:  None.  BLOOD LOSS:  Minimal.  COMPLICATIONS:  None.  INDICATIONS:  [name] is a 78 year old white male with progressive phimosis who has elected to undergo circumcision.  FINDINGS OF PROCEDURE:  He was taken to the operating room where he was placed on the table in the supine position.  His genitalia was prepped with Betadine solution and he was draped in usual sterile fashion.  Sedation was then induced as needed and the penile block was performed using 20 mL mixture of 50% 1% lidocaine and 50% 0.25% Marcaine.  Once penile block had been instituted, a dorsal slit was performed, then the dorsal skin with the straight hemostat.  Scissors were then used to divide the crossed skin exposing the anterior glans.  Subsequent ventral slit was then performed as well, although this was shorter and once again, the skin was divided.  Once the dorsal and ventral slit had been created, the redundant foreskin wings were then excised with the scissors.  Hemostasis was achieved with the cautery set on 20, and then, the skin edges were reapproximated using a running 4-0 chronic tied in the quadrants.  Once the circumcision had been completed, a dressing of Xeroform, Kling and Coban was applied.  The patient was then moved to the recovery room in stable condition.  There were no complications.     Excell Seltzer. Annabell Howells, M.D.     JJW/MEDQ  D:  08/30/2012  T:  08/31/2012  Job:  [id number]

## 2012-09-10 ENCOUNTER — Other Ambulatory Visit (INDEPENDENT_AMBULATORY_CARE_PROVIDER_SITE_OTHER): Payer: Medicare Other

## 2012-09-10 DIAGNOSIS — Z0000 Encounter for general adult medical examination without abnormal findings: Secondary | ICD-10-CM

## 2012-09-10 DIAGNOSIS — E119 Type 2 diabetes mellitus without complications: Secondary | ICD-10-CM

## 2012-09-10 LAB — CBC WITH DIFFERENTIAL/PLATELET
Basophils Relative: 0.5 % (ref 0.0–3.0)
Eosinophils Absolute: 0.3 10*3/uL (ref 0.0–0.7)
Eosinophils Relative: 5.1 % — ABNORMAL HIGH (ref 0.0–5.0)
Lymphocytes Relative: 33.4 % (ref 12.0–46.0)
MCHC: 33.7 g/dL (ref 30.0–36.0)
Neutrophils Relative %: 55.1 % (ref 43.0–77.0)
RBC: 4.36 Mil/uL (ref 4.22–5.81)
WBC: 6.8 10*3/uL (ref 4.5–10.5)

## 2012-09-10 LAB — URINALYSIS, ROUTINE W REFLEX MICROSCOPIC
Specific Gravity, Urine: >=1.03 (ref 1.000–1.030)
Total Protein, Urine: NEGATIVE
Urine Glucose: NEGATIVE
pH: 6 (ref 5.0–8.0)

## 2012-09-10 LAB — HEPATIC FUNCTION PANEL
Alkaline Phosphatase: 40 U/L (ref 39–117)
Bilirubin, Direct: 0.1 mg/dL (ref 0.0–0.3)
Total Protein: 7.1 g/dL (ref 6.0–8.3)

## 2012-09-10 LAB — MICROALBUMIN / CREATININE URINE RATIO
Creatinine,U: 272.1 mg/dL
Microalb, Ur: 2.8 mg/dL — ABNORMAL HIGH (ref 0.0–1.9)

## 2012-09-10 LAB — BASIC METABOLIC PANEL
CO2: 30 mEq/L (ref 19–32)
Calcium: 9.5 mg/dL (ref 8.4–10.5)
GFR: 76.41 mL/min (ref >60.00)
Sodium: 141 mEq/L (ref 135–145)

## 2012-09-10 LAB — LIPID PANEL
HDL: 44.2 mg/dL (ref >39.00)
Total CHOL/HDL Ratio: 3

## 2012-09-10 LAB — HEMOGLOBIN A1C: Hgb A1c MFr Bld: 6.4 % (ref 4.6–6.5)

## 2012-09-12 ENCOUNTER — Ambulatory Visit: Payer: Medicare Other | Admitting: Internal Medicine

## 2012-12-03 ENCOUNTER — Encounter: Payer: Self-pay | Admitting: Cardiology

## 2012-12-03 NOTE — Progress Notes (Unsigned)
Patient ID: [name], male   DOB: [birth year], 78 y.o.   MRN: [id number]  Joshua, [name]    Date of visit:  12/03/2012 DOB:  06-18-1934    Age:  77 yrs. Medical record number:  [id number]     Account number:  [id number] Primary Care Provider: Corwin Levins ____________________________ CURRENT DIAGNOSES  1. Hypertensive Heart Disease-Benign without CHF  2. Obesity(BMI30-40)  3. Cardiomyopathy Idiopathic  4. Hyperlipidemia  5. Carotid Artery Stenosis  6. Aneurysm-Abdominal  7. Diabetes Mellitus-NIDD  8. Cardiomyopathy-idiopathic ____________________________ ALLERGIES  No Known Allergies ____________________________ MEDICATIONS  1. fluticasone 50 mcg/actuation Disk with Device, PRN  2. multivitamin tablet, 1 p.o. daily  3. carvedilol 12.5 mg tablet, 1/2 tab b.i.d.  4. Ocuvite 150-30-6-150 mg-unit-mg-mg capsule, 1 p.o. daily  5. Glucosamine 500 mg tablet, 1 p.o. daily  6. metformin 1,000 mg tablet, BID  7. Crestor 40 mg tablet, 1/2 tab daily  8. aspirin 81 mg chewable tablet, 1 p.o. daily ____________________________ CHIEF COMPLAINTS  Followup of Carotid Artery Stenosis  Followup of Hypertensive Heart Disease-Benign without CHF ____________________________ HISTORY OF PRESENT ILLNESS  Patient seen for cardiac followup. He has had a good year since he was previously here. He is exercising on a regular basis. He denies angina and has no PND, orthopnea, syncope, palpitations, or claudication. He and his wife have moved to [place] and are enjoying it there. He has gained additional weight since he was last here. He does have a history of a cardiomyopathy that has not been evaluated in about 3 years. ____________________________ PAST HISTORY  Past Medical Illnesses:  hypertension, hyperlipidemia, carotid artery stenosis-right, obesity, nephrolithiasis, BPH;  Cardiovascular Illnesses:  Carotid artery disease, abdominal aneurysm;  Surgical Procedures:  knee replacement-bil, cataract  extraction;  Cardiology Procedures-Invasive:  no history of prior cardiac procedures;  Cardiology Procedures-Noninvasive:  treadmill cardiolite June 2008, echocardiogram August 2009, echocardiogram September 2011;  LVEF of 50% documented via echocardiogram on 10/21/2009,   ____________________________ CARDIO-PULMONARY TEST DATES EKG Date:  12/03/2012;  Nuclear Study Date:  07/24/2006;  Echocardiography Date: 10/21/2009;  Chest Xray Date: 08/23/2006;   ____________________________ FAMILY HISTORY Father -- Father dead, Motor neurone disease, Deceased Mother -- Coronary Artery Disease, Renal disorder, Deceased ____________________________ SOCIAL HISTORY Alcohol Use:  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 Cardiovascular:  please review HPI Abdominal: denies dyspepsia, GI bleeding, constipation, or diarrhea Genitourinary-Male: nocturia  Musculoskeletal:  arthritis of the knees  ____________________________ PHYSICAL EXAMINATION VITAL SIGNS  Blood Pressure:  122/60 Sitting, Left arm, regular cuff  , 118/62 Standing, Left arm and regular cuff   Pulse:  68/min. Weight:  257.00 lbs. Height:  71"BMI: 36  Constitutional:  pleasant white male in no acute distress, mildly obese Skin:  occasional hemangiomas, maculopapular lesions feet Head:  normocephalic, normal hair pattern, no masses or tenderness ENT:  hearing aide present left ear, ears, nose and throat  unremarkable Neck:  supple, no masses, thyromegaly, JVD. Carotid pulses are full and equal bilaterally without bruits. Chest:  normal symmetry,  clear to auscultation and percussion. Cardiac:  regular rhythm, normal S1 and S2, No S3 or S4, no murmurs, gallops or rubs detected. Peripheral Pulses:  the femoral,dorsalis pedis, and posterior tibial pulses are full and equal bilaterally with no bruits auscultated. Extremities & Back:  no edema present, bilateral lower extremity venous varicosities, Healed scar from knee surgery Neurological:  no gross motor or sensory deficits noted, affect appropriate, oriented x3. ____________________________ MOST RECENT LIPID PANEL 09/02/11  CHOL TOTL 126 mg/dl, LDL 66 calc, HDL 49 mg/dl, TRIGLYCER 53 mg/dl and CHOL/HDL 3 (Calc) ____________________________ IMPRESSIONS/PLAN  1. Prior history of cardiomyopathy and PVCs 2. Obesity with weight gain and need to lose additional weight 3. Hypertensive heart disease  Recommendations:  Obtain repeat echocardiogram to evaluate stability of cardiomyopathy but since he is doing well otherwise followup in one year. Call if problems.  ____________________________ TODAYS ORDERS  1. 2D, color flow, doppler: First Available  2. Return Visit: 1 year  3. 12 Lead EKG: Today                       ____________________________ Cardiology Physician:  Darden Palmer MD [facility]

## 2012-12-13 ENCOUNTER — Other Ambulatory Visit: Payer: Self-pay

## 2012-12-24 ENCOUNTER — Other Ambulatory Visit: Payer: Self-pay | Admitting: Internal Medicine

## 2013-02-02 ENCOUNTER — Other Ambulatory Visit: Payer: Self-pay | Admitting: Internal Medicine

## 2013-03-20 ENCOUNTER — Other Ambulatory Visit: Payer: Self-pay | Admitting: Vascular Surgery

## 2013-03-20 DIAGNOSIS — I714 Abdominal aortic aneurysm, without rupture, unspecified: Secondary | ICD-10-CM

## 2013-03-20 DIAGNOSIS — I6529 Occlusion and stenosis of unspecified carotid artery: Secondary | ICD-10-CM

## 2013-03-27 ENCOUNTER — Other Ambulatory Visit: Payer: Self-pay | Admitting: Internal Medicine

## 2013-04-06 ENCOUNTER — Other Ambulatory Visit: Payer: Self-pay | Admitting: Internal Medicine

## 2013-04-18 ENCOUNTER — Ambulatory Visit: Payer: Medicare Other | Admitting: Internal Medicine

## 2013-04-19 ENCOUNTER — Other Ambulatory Visit (INDEPENDENT_AMBULATORY_CARE_PROVIDER_SITE_OTHER): Payer: Managed Care, Other (non HMO)

## 2013-04-19 ENCOUNTER — Ambulatory Visit (INDEPENDENT_AMBULATORY_CARE_PROVIDER_SITE_OTHER): Payer: Managed Care, Other (non HMO) | Admitting: Internal Medicine

## 2013-04-19 ENCOUNTER — Encounter: Payer: Self-pay | Admitting: Internal Medicine

## 2013-04-19 VITALS — BP 122/74 | HR 58 | Temp 97.8°F | Ht 71.0 in | Wt 263.8 lb

## 2013-04-19 DIAGNOSIS — E119 Type 2 diabetes mellitus without complications: Secondary | ICD-10-CM

## 2013-04-19 DIAGNOSIS — E785 Hyperlipidemia, unspecified: Secondary | ICD-10-CM

## 2013-04-19 DIAGNOSIS — Z23 Encounter for immunization: Secondary | ICD-10-CM

## 2013-04-19 DIAGNOSIS — I10 Essential (primary) hypertension: Secondary | ICD-10-CM

## 2013-04-19 DIAGNOSIS — R972 Elevated prostate specific antigen [PSA]: Secondary | ICD-10-CM

## 2013-04-19 LAB — BASIC METABOLIC PANEL
BUN: 22 mg/dL (ref 6–23)
CALCIUM: 9.6 mg/dL (ref 8.4–10.5)
CO2: 30 meq/L (ref 19–32)
CREATININE: 1.1 mg/dL (ref 0.4–1.5)
Chloride: 101 mEq/L (ref 96–112)
GFR: 70.61 mL/min (ref >60.00)
GLUCOSE: 190 mg/dL — AB (ref 70–99)
Potassium: 4 mEq/L (ref 3.5–5.1)
Sodium: 138 mEq/L (ref 135–145)

## 2013-04-19 LAB — HEPATIC FUNCTION PANEL
ALBUMIN: 4.5 g/dL (ref 3.5–5.2)
ALK PHOS: 42 U/L (ref 39–117)
ALT: 46 U/L (ref 0–53)
AST: 33 U/L (ref 0–37)
Bilirubin, Direct: 0.1 mg/dL (ref 0.0–0.3)
TOTAL PROTEIN: 7.1 g/dL (ref 6.0–8.3)
Total Bilirubin: 0.8 mg/dL (ref 0.3–1.2)

## 2013-04-19 LAB — LIPID PANEL
Cholesterol: 128 mg/dL (ref 0–200)
HDL: 40.3 mg/dL (ref >39.00)
LDL Cholesterol: 50 mg/dL (ref 0–99)
TRIGLYCERIDES: 191 mg/dL — AB (ref 0.0–149.0)
Total CHOL/HDL Ratio: 3
VLDL: 38.2 mg/dL (ref 0.0–40.0)

## 2013-04-19 LAB — HEMOGLOBIN A1C: HEMOGLOBIN A1C: 7.2 % — AB (ref 4.6–6.5)

## 2013-04-19 LAB — PSA: PSA: 1.73 ng/mL (ref 0.10–4.00)

## 2013-04-19 NOTE — Patient Instructions (Signed)
You had the new Prevnar pneumonia shot today Please continue all other medications as before, and refills have been done if requested. Please have the pharmacy call with any other refills you may need.  Please continue your efforts at being more active, low cholesterol diet, and weight control.  Please go to the LAB in the Basement (turn left off the elevator) for the tests to be done today - for sugar, cholesterol and the PSA today  Please return in 6 months, or sooner if needed

## 2013-04-19 NOTE — Assessment & Plan Note (Signed)
stable overall by history and exam, recent data reviewed with pt, and pt to continue medical treatment as before,  to f/u any worsening symptoms or concerns .[id number]  

## 2013-04-19 NOTE — Progress Notes (Signed)
Subjective:    Patient ID: [name], male    DOB: 10-Feb-1934, 79 y.o.   MRN: [id number]  HPI  Here to f/u; overall doing ok,  Pt denies chest pain, increased sob or doe, wheezing, orthopnea, PND, increased LE swelling, palpitations, dizziness or syncope.  Pt denies polydipsia, polyuria, or low sugar symptoms such as weakness or confusion improved with po intake.  Pt denies new neurological symptoms such as new headache, or facial or extremity weakness or numbness.   Pt states overall good compliance with meds, has been trying to follow lower cholesterol, diabetic diet, with wt overall stable,  but little exercise however. No acute complaints.  Plans to be more active now in the spring season with golf.  Denies urinary symptoms such as dysuria, frequency, urgency, flank pain, hematuria or n/v, fever, chills.  Past Medical History  Diagnosis Date  . Type 2 diabetes mellitus   . History of kidney stones   . Diverticulosis of colon   . Allergic rhinitis   . BPH (benign prostatic hypertrophy)   . Hyperlipidemia   . Phimosis   . Hypertension   . Peripheral vascular disease   . AAA (abdominal aortic aneurysm)     MONITORED BY DR LAWSON LOV JUNE 2014--  STABLE  AT 3.7  . Asymptomatic stenosis of left carotid artery without infarction     MILD DISEASE  . Right carotid artery occlusion     CHRONIC   WITHOUT INFARTION  . Wears hearing aid   . History of cardiomyopathy     IDIOPATHIC-- PER [facility] NOTE 2013 , CURRENTLY RESOLVED   Past Surgical History  Procedure Laterality Date  . Total knee arthroplasty Bilateral RIGHT 10-08-2007;   LEFT 09-30-2008  . Cysto/  right ureteral stent placement  10-16-2003  . Extracorporeal shock wave lithotripsy Right 2005  . Transthoracic echocardiogram  10-21-2009    LVEF 50%  . Cardiovascular stress test  JUNE 2008  DR Wynonia Lawman  . Cataract extraction w/ intraocular lens implant Right   . Circumcision N/A 08/30/2012    Procedure: CIRCUMCISION ADULT;   Surgeon: Malka So, MD;  Location: [facility];  Service: Urology;  Laterality: N/A;    reports that he quit smoking about 30 years ago. His smoking use included Cigarettes. He has a 18.75 pack-year smoking history. He has never used smokeless tobacco. He reports that he does not drink alcohol or use illicit drugs. family history includes Asthma in his mother; COPD in his mother; Depression in his mother; Heart disease in his mother; Hypertension in his mother; Kidney disease in his mother. No Known Allergies Current Outpatient Prescriptions on File Prior to Visit  Medication Sig Dispense Refill  . acetaminophen (TYLENOL) 500 MG tablet Take 500 mg by mouth daily as needed.       Marland Kitchen aspirin 81 MG tablet Take 81 mg by mouth daily.       . carvedilol (COREG) 12.5 MG tablet Take 0.5 tablets (6.25 mg total) by mouth 2 (two) times daily with a meal. 1/2 by mouth two times a day  90 tablet  3  . cetirizine (ZYRTEC) 10 MG tablet TAKE 1 TABLET BY MOUTH EVERY DAY  30 tablet  11  . clotrimazole-betamethasone (LOTRISONE) cream USE AS DIRECTED TWICE A DAY AS NEEDED  15 g  2  . CRESTOR 40 MG tablet TAKE 1 TABLET (40 MG TOTAL) BY MOUTH DAILY.  90 tablet  3  . Glucosamine-Chondroit-Vit C-Mn (GLUCOSAMINE 1500 COMPLEX  PO) Take 2 tablets by mouth daily.       . hydrochlorothiazide (HYDRODIURIL) 25 MG tablet TAKE 1 TABLET (25 MG TOTAL) BY MOUTH DAILY.  90 tablet  3  . HYDROcodone-acetaminophen (NORCO) 5-325 MG per tablet Take 1 tablet by mouth every 6 (six) hours as needed for pain.  15 tablet  1  . metFORMIN (GLUCOPHAGE) 1000 MG tablet TAKE 1 TABLET BY MOUTH TWICE A DAY  180 tablet  2  . Multiple Vitamin (MULTIVITAMIN) capsule Take 1 capsule by mouth daily.        . rosuvastatin (CRESTOR) 40 MG tablet Take 20 mg by mouth every morning.      . triamterene (DYRENIUM) 50 MG capsule Take 1 capsule (50 mg total) by mouth 2 (two) times daily.  90 capsule  3   No current facility-administered medications  on file prior to visit.   Review of Systems  Constitutional: Negative for unexpected weight change, or unusual diaphoresis  HENT: Negative for tinnitus.   Eyes: Negative for photophobia and visual disturbance.  Respiratory: Negative for choking and stridor.   Gastrointestinal: Negative for vomiting and blood in stool.  Genitourinary: Negative for hematuria and decreased urine volume.  Musculoskeletal: Negative for acute joint swelling Skin: Negative for color change and wound.  Neurological: Negative for tremors and numbness other than noted  Psychiatric/Behavioral: Negative for decreased concentration or  hyperactivity.       Objective:   Physical Exam BP 122/74  Pulse 58  Temp(Src) 97.8 F (36.6 C) (Oral)  Ht 5\' 11"  (1.803 m)  Wt 263 lb 12 oz (119.636 kg)  BMI 36.80 kg/m2  SpO2 97% VS noted,  Constitutional: Pt appears well-developed and well-nourished.  HENT: Head: NCAT.  Right Ear: External ear normal.  Left Ear: External ear normal.  Eyes: Conjunctivae and EOM are normal. Pupils are equal, round, and reactive to light.  Neck: Normal range of motion. Neck supple.  Cardiovascular: Normal rate and regular rhythm.   Pulmonary/Chest: Effort normal and breath sounds normal.  Abd:  Soft, NT, non-distended, + BS Neurological: Pt is alert. Not confused  Skin: Skin is warm. No erythema.  Psychiatric: Pt behavior is normal. Thought content normal.     Assessment & Plan:

## 2013-04-19 NOTE — Assessment & Plan Note (Signed)
stable overall by history and exam, recent data reviewed with pt, and pt to continue medical treatment as before,  to f/u any worsening symptoms or concerns / Lab Results  Component Value Date   HGBA1C 6.4 09/10/2012

## 2013-04-19 NOTE — Progress Notes (Signed)
Pre visit review using our clinic review tool, if applicable. No additional management support is needed unless otherwise documented below in the visit note. 

## 2013-04-19 NOTE — Addendum Note (Signed)
Addended by: Sharon Seller B on: 04/19/2013 02:28 PM   Modules accepted: Orders

## 2013-04-19 NOTE — Assessment & Plan Note (Signed)
stable overall by history and exam, recent data reviewed with pt, and pt to continue medical treatment as before,  to f/u any worsening symptoms or concerns Lab Results  Component Value Date   LDLCALC 66 09/10/2012

## 2013-04-21 ENCOUNTER — Other Ambulatory Visit: Payer: Self-pay | Admitting: Internal Medicine

## 2013-04-22 ENCOUNTER — Telehealth: Payer: Self-pay | Admitting: Internal Medicine

## 2013-04-22 NOTE — Telephone Encounter (Signed)
Relevant patient education assigned to patient using Emmi. ° °

## 2013-05-28 ENCOUNTER — Other Ambulatory Visit: Payer: Self-pay | Admitting: Internal Medicine

## 2013-05-28 NOTE — Telephone Encounter (Signed)
Refill done.  

## 2013-07-22 ENCOUNTER — Encounter: Payer: Self-pay | Admitting: Family

## 2013-07-23 ENCOUNTER — Ambulatory Visit (INDEPENDENT_AMBULATORY_CARE_PROVIDER_SITE_OTHER)
Admission: RE | Admit: 2013-07-23 | Discharge: 2013-07-23 | Disposition: A | Discharge status: Home / Self Care | Payer: Medicare HMO | Source: Ambulatory Visit | Attending: Vascular Surgery | Admitting: Vascular Surgery

## 2013-07-23 ENCOUNTER — Encounter: Payer: Self-pay | Admitting: Family

## 2013-07-23 ENCOUNTER — Ambulatory Visit (HOSPITAL_COMMUNITY)
Admission: RE | Admit: 2013-07-23 | Discharge: 2013-07-23 | Disposition: A | Discharge status: Home / Self Care | Payer: Medicare HMO | Source: Ambulatory Visit | Attending: Family | Admitting: Family

## 2013-07-23 ENCOUNTER — Ambulatory Visit (INDEPENDENT_AMBULATORY_CARE_PROVIDER_SITE_OTHER): Payer: Managed Care, Other (non HMO) | Admitting: Family

## 2013-07-23 VITALS — BP 148/80 | HR 56 | Resp 16 | Ht 71.0 in | Wt 268.0 lb

## 2013-07-23 DIAGNOSIS — I714 Abdominal aortic aneurysm, without rupture, unspecified: Secondary | ICD-10-CM

## 2013-07-23 DIAGNOSIS — I6529 Occlusion and stenosis of unspecified carotid artery: Secondary | ICD-10-CM | POA: Insufficient documentation

## 2013-07-23 DIAGNOSIS — Z48812 Encounter for surgical aftercare following surgery on the circulatory system: Secondary | ICD-10-CM

## 2013-07-23 NOTE — Patient Instructions (Signed)
Abdominal Aortic Aneurysm An aneurysm is a weakened or damaged part of an artery wall that bulges from the normal force of blood pumping through the body. An abdominal aortic aneurysm is an aneurysm that occurs in the lower part of the aorta, the main artery of the body.  The major concern with an abdominal aortic aneurysm is that it can enlarge and burst (rupture) or blood can flow between the layers of the wall of the aorta through a tear (aorticdissection). Both of these conditions can cause bleeding inside the body and can be life threatening unless diagnosed and treated promptly. CAUSES  The exact cause of an abdominal aortic aneurysm is unknown. Some contributing factors are:   A hardening of the arteries caused by the buildup of fat and other substances in the lining of a blood vessel (arteriosclerosis).  Inflammation of the walls of an artery (arteritis).   Connective tissue diseases, such as Marfan syndrome.   Abdominal trauma.   An infection, such as syphilis or staphylococcus, in the wall of the aorta (infectious aortitis) caused by bacteria. RISK FACTORS  Risk factors that contribute to an abdominal aortic aneurysm may include:  Age older than 60 years.   High blood pressure (hypertension).  Male gender.  Ethnicity (white race).  Obesity.  Family history of aneurysm (first degree relatives only).  Tobacco use. PREVENTION  The following healthy lifestyle habits may help decrease your risk of abdominal aortic aneurysm:  Quitting smoking. Smoking can raise your blood pressure and cause arteriosclerosis.  Limiting or avoiding alcohol.  Keeping your blood pressure, blood sugar level, and cholesterol levels within normal limits.  Decreasing your salt intake. In somepeople, too much salt can raise blood pressure and increase your risk of abdominal aortic aneurysm.  Eating a diet low in saturated fats and cholesterol.  Increasing your fiber intake by including  whole grains, vegetables, and fruits in your diet. Eating these foods may help lower blood pressure.  Maintaining a healthy weight.  Staying physically active and exercising regularly. SYMPTOMS  The symptoms of abdominal aortic aneurysm may vary depending on the size and rate of growth of the aneurysm.Most grow slowly and do not have any symptoms. When symptoms do occur, they may include:  Pain (abdomen, side, lower back, or groin). The pain may vary in intensity. A sudden onset of severe pain may indicate that the aneurysm has ruptured.  Feeling full after eating only small amounts of food.  Nausea or vomiting or both.  Feeling a pulsating lump in the abdomen.  Feeling faint or passing out. DIAGNOSIS  Since most unruptured abdominal aortic aneurysms have no symptoms, they are often discovered during diagnostic exams for other conditions. An aneurysm may be found during the following procedures:  Ultrasonography (A one-time screening for abdominal aortic aneurysm by ultrasonography is also recommended for all men aged 65-75 years who have ever smoked).  X-ray exams.  A computed tomography (CT).  Magnetic resonance imaging (MRI).  Angiography or arteriography. TREATMENT  Treatment of an abdominal aortic aneurysm depends on the size of your aneurysm, your age, and risk factors for rupture. Medication to control blood pressure and pain may be used to manage aneurysms smaller than 6 cm. Regular monitoring for enlargement may be recommended by your caregiver if:  The aneurysm is 3 4 cm in size (an annual ultrasonography may be recommended).  The aneurysm is 4 4.5 cm in size (an ultrasonography every 6 months may be recommended).  The aneurysm is larger than 4.5   cm in size (your caregiver may ask that you be examined by a vascular surgeon). If your aneurysm is larger than 6 cm, surgical repair may be recommended. There are two main methods for repair of an aneurysm:   Endovascular  repair (a minimally invasive surgery). This is done most often.  Open repair. This method is used if an endovascular repair is not possible. Document Released: 11/03/2004 Document Revised: 05/21/2012 Document Reviewed: 02/24/2012 [facility] Patient Information 2014 [place], [place].   Stroke Prevention Some medical conditions and behaviors are associated with an increased chance of having a stroke. You may prevent a stroke by making healthy choices and managing medical conditions. HOW CAN I REDUCE MY RISK OF HAVING A STROKE?   Stay physically active. Get at least 30 minutes of activity on most or all days.  Do not smoke. It may also be helpful to avoid exposure to secondhand smoke.  Limit alcohol use. Moderate alcohol use is considered to be:  No more than 2 drinks per day for men.  No more than 1 drink per day for nonpregnant women.  Eat healthy foods. This involves  Eating 5 or more servings of fruits and vegetables a day.  Following a diet that addresses high blood pressure (hypertension), high cholesterol, diabetes, or obesity.  Manage your cholesterol levels.  A diet low in saturated fat, trans fat, and cholesterol and high in fiber may control cholesterol levels.  Take any prescribed medicines to control cholesterol as directed by your health care [name].  Manage your diabetes.  A controlled-carbohydrate, controlled-sugar diet is recommended to manage diabetes.  Take any prescribed medicines to control diabetes as directed by your health care [name].  Control your hypertension.  A low-salt (sodium), low-saturated fat, low-trans fat, and low-cholesterol diet is recommended to manage hypertension.  Take any prescribed medicines to control hypertension as directed by your health care [name].  Maintain a healthy weight.  A reduced-calorie, low-sodium, low-saturated fat, low-trans fat, low-cholesterol diet is recommended to manage weight.  Stop drug  abuse.  Avoid taking birth control pills.  Talk to your health care [name] about the risks of taking birth control pills if you are over 82 years old, smoke, get migraines, or have ever had a blood clot.  Get evaluated for sleep disorders (sleep apnea).  Talk to your health care [name] about getting a sleep evaluation if you snore a lot or have excessive sleepiness.  Take medicines as directed by your health care [name].  For some people, aspirin or blood thinners (anticoagulants) are helpful in reducing the risk of forming abnormal blood clots that can lead to stroke. If you have the irregular heart rhythm of atrial fibrillation, you should be on a blood thinner unless there is a good reason you cannot take them.  Understand all your medicine instructions.  Make sure that other other conditions (such as anemia or atherosclerosis) are addressed. SEEK IMMEDIATE MEDICAL CARE IF:   You have sudden weakness or numbness of the face, arm, or leg, especially on one side of the body.  Your face or eyelid droops to one side.  You have sudden confusion.  You have trouble speaking (aphasia) or understanding.  You have sudden trouble seeing in one or both eyes.  You have sudden trouble walking.  You have dizziness.  You have a loss of balance or coordination.  You have a sudden, severe headache with no known cause.  You have new chest pain or an irregular heartbeat. Any of these symptoms  may represent a serious problem that is an emergency. Do not wait to see if the symptoms will go away. Get medical help at once. Call your local emergency services  (911 in [place] Do not drive yourself to the hospital. Document Released: 03/03/2004 Document Revised: 11/14/2012 Document Reviewed: 07/27/2012 [facility] Patient Information 2014 [place].

## 2013-07-23 NOTE — Progress Notes (Signed)
[facility] [place] HISTORY AND PHYSICAL   MRN : [id number]  History of Present Illness:   [name] is a 79 y.o. male patient of Dr. Kellie Simmering who been followed for carotid occlusive disease and a small aortic aneurysm. He returns today for one year follow up. 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 He denies abdominal of back pain. He denies claudication symptoms in legs with walking, denies non healing wounds, denies any new medical problems or surgeries. He takes a daily ASA, daily statin, and a beta blocker, denies taking any anticoagulants, states he has cardiomyopathy, denies any history of MI.  Pt Diabetic: Yes, states in good control Pt smoker: former smoker, quit about 1985  He is a recovering alcoholic.   Current Outpatient Prescriptions  Medication Sig Dispense Refill  . acetaminophen (TYLENOL) 500 MG tablet Take 500 mg by mouth daily as needed.       Marland Kitchen aspirin 81 MG tablet Take 81 mg by mouth daily.       . carvedilol (COREG) 12.5 MG tablet TAKE 1/2 TABLET BY MOUTH TWICE A DAY WITH A MEAL  90 tablet  1  . cetirizine (ZYRTEC) 10 MG tablet TAKE 1 TABLET BY MOUTH EVERY DAY  30 tablet  11  . clotrimazole-betamethasone (LOTRISONE) cream USE AS DIRECTED TWICE A DAY AS NEEDED  15 g  2  . Glucosamine-Chondroit-Vit C-Mn (GLUCOSAMINE 1500 COMPLEX PO) Take 2 tablets by mouth daily.       . hydrochlorothiazide (HYDRODIURIL) 25 MG tablet TAKE 1 TABLET (25 MG TOTAL) BY MOUTH DAILY.  90 tablet  3  . HYDROcodone-acetaminophen (NORCO) 5-325 MG per tablet Take 1 tablet by mouth every 6 (six) hours as needed for pain.  15 tablet  1  . metFORMIN (GLUCOPHAGE) 1000 MG tablet TAKE 1 TABLET BY  MOUTH TWICE DAILY  180 tablet  1  . Multiple Vitamin (MULTIVITAMIN) capsule Take 1 capsule by mouth daily.        . rosuvastatin (CRESTOR) 40 MG tablet Take 20 mg by mouth every morning.      Marland Kitchen CRESTOR 40 MG tablet TAKE 1 TABLET (40 MG TOTAL) BY MOUTH DAILY.  90 tablet  3  . triamterene (DYRENIUM) 50 MG capsule Take 1 capsule (50 mg total) by mouth 2 (two) times daily.  90 capsule  3   No current facility-administered medications for this visit.     Past Medical History  Diagnosis Date  . Type 2 diabetes mellitus   . History of kidney stones   . Diverticulosis of colon   . Allergic rhinitis   . BPH (benign prostatic hypertrophy)   . Hyperlipidemia   . Phimosis   . Hypertension   . Peripheral vascular disease   . AAA (abdominal aortic aneurysm)     MONITORED BY DR LAWSON LOV JUNE 2014--  STABLE  AT 3.7  . Asymptomatic stenosis of left carotid artery without infarction     MILD DISEASE  . Right carotid artery occlusion     CHRONIC   WITHOUT INFARTION  . Wears hearing aid   . History of cardiomyopathy     IDIOPATHIC-- PER [facility] NOTE 2013 , CURRENTLY RESOLVED  . Stroke  Right eye    Past Surgical History  Procedure Laterality Date  . Total knee arthroplasty Bilateral RIGHT 10-08-2007;   LEFT 09-30-2008  . Cysto/  right ureteral stent placement  10-16-2003  . Extracorporeal shock wave lithotripsy Right 2005  . Transthoracic echocardiogram  10-21-2009    LVEF 50%  . Cardiovascular stress test  JUNE 2008  DR Wynonia Lawman  . Cataract extraction w/ intraocular lens implant Right   . Circumcision N/A 08/30/2012    Procedure: CIRCUMCISION ADULT;  Surgeon: Malka So, MD;  Location: [facility];  Service: Urology;  Laterality: N/A;  . Eye surgery    . Joint replacement Bilateral 2008  2009    Knee    Social History History  Substance Use Topics  . Smoking status: Former Smoker -- 0.75 packs/day for 25 years    Types: Cigarettes    Quit date: 07/25/1982  .  Smokeless tobacco: Never Used  . Alcohol Use: No     Comment: recovering alcoholic  [id number]    Family History Family History  Problem Relation Age of Onset  . Heart disease Mother     CHF  Before age 55  . COPD Mother   . Kidney disease Mother   . Hypertension Mother   . Asthma Mother   . Depression Mother   . ALS Father    No Known Allergies   REVIEW OF SYSTEMS: See HPI for pertinent positives and negatives.  Physical Examination Filed Vitals:   07/23/13 0954 07/23/13 1005  BP: 152/90 148/80  Pulse: 56 56  Resp:  16  Height:  5\' 11"  (1.803 m)  Weight:  268 lb (121.564 kg)  SpO2:  98%   Body mass index is 37.39 kg/(m^2).  General:  WDWN obese male in NAD Gait: Normal HENT: WNL Eyes: Pupils equal Pulmonary: normal non-labored breathing , without Rales, rhonchi,  wheezing Cardiac: RRR, no murmurs detected  Abdomen: soft, NT, no masses Skin: no rashes, ulcers noted;  no Gangrene , no cellulitis; no open wounds;   VASCULAR EXAM  Carotid Bruits Left Right   Negative Negative      Radial pulses are 2+ palpable and =                       VASCULAR EXAM: Extremities without ischemic changes  without Gangrene; without open wounds, onchomycosis of toenails, pruritic mild macular rash on feet, using topical p[rescribed cream which helps.                                                                                                          LE Pulses LEFT RIGHT       FEMORAL  2+ palpable  2+ palpable        POPLITEAL  not palpable   not palpable       POSTERIOR TIBIAL  2+ palpable   3+ palpable        DORSALIS PEDIS      ANTERIOR TIBIAL 2+ palpable  2+ palpable  Musculoskeletal: no muscle wasting or atrophy; no edema  Neurologic: A&O X 3; Appropriate Affect ;  SENSATION: normal; MOTOR FUNCTION: 5/5 Symmetric, CN 2-12 intact except for hearing loss. Speech is fluent/normal   Non-Invasive Vascular Imaging (07/23/2013):  ABDOMINAL AORTA DUPLEX  EVALUATION    INDICATION: Evaluation of abdominal aorta.    PREVIOUS INTERVENTION(S):     DUPLEX EXAM:     LOCATION DIAMETER AP (cm) DIAMETER TRANSVERSE (cm) VELOCITIES (cm/sec)  Aorta Proximal 2.87 2.76 54  Aorta Mid 3.32 3.24 58  Aorta Distal 2.87  34  Right Common Iliac Artery Not Visualized  Not Visualized    Left Common Iliac Artery 1.45 Not Visualized      Previous max aortic diameter:  3.59 x 3.61 Date:      ADDITIONAL FINDINGS:     IMPRESSION: Patent abdominal aortic aneurysm with a maximum diameter measurement of 3.32 x 3.24 cm. Limited visualization of the common iliac arteries due to overlying bowel gas.    Compared to the previous exam:  No significant change in comparison to the last exam on 07/24/2012.   CEREBROVASCULAR DUPLEX EVALUATION    INDICATION: Carotid artery disease     PREVIOUS INTERVENTION(S):     DUPLEX EXAM: Known right internal carotid artery occlusion.    RIGHT  LEFT  Peak Systolic Velocities (cm/s) End Diastolic Velocities (cm/s) Plaque LOCATION Peak Systolic Velocities (cm/s) End Diastolic Velocities (cm/s) Plaque  61 9  CCA PROXIMAL 78 22   44 6  CCA MID 84 25 HT  44 9  CCA DISTAL 70 24 HT  97 14  ECA 87 11 CP  26 0  ICA PROXIMAL 69 25 CP  Occluded    ICA MID 85 29   Occluded    ICA DISTAL 72 26      ICA / CCA Ratio (PSV) 1.01  Antegrade  Vertebral Flow Antegrade   417 Brachial Systolic Pressure (mmHg) 408  Triphasic  Brachial Artery Waveforms Triphasic     Plaque Morphology:  HM = Homogeneous, HT = Heterogeneous, CP = Calcific Plaque, SP = Smooth Plaque, IP = Irregular Plaque     ADDITIONAL FINDINGS:     IMPRESSION: Known occlusion of the right internal carotid artery. Left internal carotid artery velocities suggest a <40% stenosis.     Compared to the previous exam:  No significant change in velocities when compared to the last exam on 07/24/2012.     ASSESSMENT:  ROMEL DUMOND is a 79 y.o. male who been followed for  carotid occlusive disease and a small aortic aneurysm. He is asymptomatic, no growth of the small AAA, no significant change of the ICA's, nown occlusion of the right internal carotid artery. Left internal carotid artery velocities suggest a <40% stenosis. Marland Kitchen  PLAN:   Based on today's exam and non-invasive vascular lab results, the patient will follow up in 1 year with the following tests carotid Duplex and AAA Duplex. I discussed in depth with the patient the nature of atherosclerosis, and emphasized the importance of maximal medical management including strict control of blood pressure, blood glucose, and lipid levels, obtaining regular exercise, and continued cessation of smoking.  The patient is aware that without maximal medical management the underlying atherosclerotic disease process will progress, limiting the benefit of any interventions. The patient was given information about stroke prevention and what symptoms should prompt the patient to seek immediate medical care. The patient was given information about AAA including signs, symptoms, treatment,  what symptoms should  prompt the patient to seek immediate medical care, and how to minimize the risk of enlargement and rupture of aneurysms.  Thank you for allowing [place] to participate in this patient's care.  Clemon Chambers, RN, MSN, FNP-C [facility] Office: [phone]  Clinic MD: Kellie Simmering 07/23/2013 9:59 AM

## 2013-10-21 ENCOUNTER — Other Ambulatory Visit: Payer: Medicare HMO

## 2013-10-23 ENCOUNTER — Ambulatory Visit: Payer: Medicare HMO

## 2013-10-23 ENCOUNTER — Ambulatory Visit (INDEPENDENT_AMBULATORY_CARE_PROVIDER_SITE_OTHER): Payer: Medicare HMO | Admitting: Internal Medicine

## 2013-10-23 ENCOUNTER — Encounter: Payer: Self-pay | Admitting: Internal Medicine

## 2013-10-23 VITALS — BP 120/80 | HR 61 | Temp 97.5°F | Wt 264.0 lb

## 2013-10-23 DIAGNOSIS — H9193 Unspecified hearing loss, bilateral: Secondary | ICD-10-CM | POA: Insufficient documentation

## 2013-10-23 DIAGNOSIS — E1059 Type 1 diabetes mellitus with other circulatory complications: Secondary | ICD-10-CM

## 2013-10-23 DIAGNOSIS — E1165 Type 2 diabetes mellitus with hyperglycemia: Secondary | ICD-10-CM

## 2013-10-23 DIAGNOSIS — E119 Type 2 diabetes mellitus without complications: Secondary | ICD-10-CM

## 2013-10-23 DIAGNOSIS — H9190 Unspecified hearing loss, unspecified ear: Secondary | ICD-10-CM

## 2013-10-23 DIAGNOSIS — Z0000 Encounter for general adult medical examination without abnormal findings: Secondary | ICD-10-CM

## 2013-10-23 DIAGNOSIS — Z23 Encounter for immunization: Secondary | ICD-10-CM

## 2013-10-23 DIAGNOSIS — I10 Essential (primary) hypertension: Secondary | ICD-10-CM

## 2013-10-23 DIAGNOSIS — IMO0001 Reserved for inherently not codable concepts without codable children: Secondary | ICD-10-CM

## 2013-10-23 HISTORY — DX: Unspecified hearing loss, bilateral: H91.93

## 2013-10-23 NOTE — Progress Notes (Signed)
Pre visit review using our clinic review tool, if applicable. No additional management support is needed unless otherwise documented below in the visit note. 

## 2013-10-23 NOTE — Assessment & Plan Note (Signed)
Acute on chronic, improved with irrigation bilat

## 2013-10-23 NOTE — Assessment & Plan Note (Signed)
stable overall by history and exam, recent data reviewed with pt, and pt to continue medical treatment as before,  to f/u any worsening symptoms or concerns BP Readings from Last 3 Encounters:  10/23/13 120/80  07/23/13 148/80  04/19/13 122/74

## 2013-10-23 NOTE — Patient Instructions (Addendum)
You had the flu shot today  Your ears were irrigated of wax today  Please continue all other medications as before, and refills have been done if requested.  Please have the pharmacy call with any other refills you may need.  Please continue your efforts at being more active, low cholesterol diet, and weight control.  You are otherwise up to date with prevention measures today.  Please keep your appointments with your specialists as you may have planned  You will be contacted regarding the referral for: colonoscopy  Please go to the LAB in the Basement (turn left off the elevator) for the tests to be done today  You will be contacted by phone if any changes need to be made immediately.  Otherwise, you will receive a letter about your results with an explanation, but please check with MyChart first.  Please remember to sign up for MyChart if you have not done so, as this will be important to you in the future with finding out test results, communicating by private email, and scheduling acute appointments online when needed.  Please return in 6 months, or sooner if needed, with Lab testing done 3-5 days before

## 2013-10-23 NOTE — Assessment & Plan Note (Signed)
stable overall by history and exam, recent data reviewed with pt, and pt to continue medical treatment as before,  to f/u any worsening symptoms or concerns Lab Results  Component Value Date   HGBA1C 7.2* 04/19/2013   For f/u labs

## 2013-10-23 NOTE — Progress Notes (Signed)
Subjective:    Patient ID: [name], male    DOB: 02-26-1934, 79 y.o.   MRN: [id number]  HPI  Here for wellness and f/u;  Overall doing ok;  Pt denies CP, worsening SOB, DOE, wheezing, orthopnea, PND, worsening LE edema, palpitations, dizziness or syncope.  Pt denies neurological change such as new headache, facial or extremity weakness.  Pt denies polydipsia, polyuria, or low sugar symptoms. Pt states overall good compliance with treatment and medications, good tolerability, and has been trying to follow lower cholesterol diet.  Pt denies worsening depressive symptoms, suicidal ideation or panic. No fever, night sweats, wt loss, loss of appetite, or other constitutional symptoms.  Pt states good ability with ADL's, has low fall risk, home safety reviewed and adequate, no other significant changes in hearing or vision, and only occasionally active with exercise - tries to walk 3 times per wk. No recent falls. Cant hear worse than usual, even hearing aid having a problem in the past wk.   Does take a nap in afternoons most days, but not really hypersomnolent. Past Medical History  Diagnosis Date  . Type 2 diabetes mellitus   . History of kidney stones   . Diverticulosis of colon   . Allergic rhinitis   . BPH (benign prostatic hypertrophy)   . Hyperlipidemia   . Phimosis   . Hypertension   . Peripheral vascular disease   . AAA (abdominal aortic aneurysm)     MONITORED BY [name] LOV JUNE 2014--  STABLE  AT 3.7  . Asymptomatic stenosis of left carotid artery without infarction     MILD DISEASE  . Right carotid artery occlusion     CHRONIC   WITHOUT INFARTION  . Wears hearing aid   . History of cardiomyopathy     IDIOPATHIC-- PER TILLEY NOTE 2013 , CURRENTLY RESOLVED  . Stroke      Right eye   Past Surgical History  Procedure Laterality Date  . Total knee arthroplasty Bilateral RIGHT 10-08-2007;   LEFT 09-30-2008  . Cysto/  right ureteral stent placement  10-16-2003  .  Extracorporeal shock wave lithotripsy Right 2005  . Transthoracic echocardiogram  10-21-2009    LVEF 50%  . Cardiovascular stress test  JUNE 2008  Joshua [name]  . Cataract extraction w/ intraocular lens implant Right   . Circumcision N/A 08/30/2012    Procedure: CIRCUMCISION ADULT;  Surgeon: Joshua So, MD;  Location: [facility];  Service: Urology;  Laterality: N/A;  . Eye surgery    . Joint replacement Bilateral 2008  2009    Knee    reports that he quit smoking about 31 years ago. His smoking use included Cigarettes. He has a 18.75 pack-year smoking history. He has never used smokeless tobacco. He reports that he does not drink alcohol or use illicit drugs. family history includes ALS in his father; Asthma in his mother; COPD in his mother; Depression in his mother; Heart disease in his mother; Hypertension in his mother; Kidney disease in his mother. No Known Allergies Current Outpatient Prescriptions on File Prior to Visit  Medication Sig Dispense Refill  . acetaminophen (TYLENOL) 500 MG tablet Take 500 mg by mouth daily as needed.       Marland Kitchen aspirin 81 MG tablet Take 81 mg by mouth daily.       . carvedilol (COREG) 12.5 MG tablet TAKE 1/2 TABLET BY MOUTH TWICE A DAY WITH A MEAL  90 tablet  1  .  cetirizine (ZYRTEC) 10 MG tablet TAKE 1 TABLET BY MOUTH EVERY DAY  30 tablet  11  . clotrimazole-betamethasone (LOTRISONE) cream USE AS DIRECTED TWICE A DAY AS NEEDED  15 g  2  . CRESTOR 40 MG tablet TAKE 1 TABLET (40 MG TOTAL) BY MOUTH DAILY.  90 tablet  3  . Glucosamine-Chondroit-Vit C-Mn (GLUCOSAMINE 1500 COMPLEX PO) Take 2 tablets by mouth daily.       . hydrochlorothiazide (HYDRODIURIL) 25 MG tablet TAKE 1 TABLET (25 MG TOTAL) BY MOUTH DAILY.  90 tablet  3  . HYDROcodone-acetaminophen (NORCO) 5-325 MG per tablet Take 1 tablet by mouth every 6 (six) hours as needed for pain.  15 tablet  1  . metFORMIN (GLUCOPHAGE) 1000 MG tablet TAKE 1 TABLET BY MOUTH TWICE DAILY  180 tablet  1  .  Multiple Vitamin (MULTIVITAMIN) capsule Take 1 capsule by mouth daily.        . rosuvastatin (CRESTOR) 40 MG tablet Take 20 mg by mouth every morning.      . triamterene (DYRENIUM) 50 MG capsule Take 1 capsule (50 mg total) by mouth 2 (two) times daily.  90 capsule  3   No current facility-administered medications on file prior to visit.    Review of Systems Constitutional: Negative for increased diaphoresis, other activity, appetite or other siginficant weight change  HENT: Negative for worsening hearing loss, ear pain, facial swelling, mouth sores and neck stiffness.   Eyes: Negative for other worsening pain, redness or visual disturbance.  Respiratory: Negative for shortness of breath and wheezing.   Cardiovascular: Negative for chest pain and palpitations.  Gastrointestinal: Negative for diarrhea, blood in stool, abdominal distention or other pain Genitourinary: Negative for hematuria, flank pain or change in urine volume.  Musculoskeletal: Negative for myalgias or other joint complaints.  Skin: Negative for color change and wound.  Neurological: Negative for syncope and numbness. other than noted Hematological: Negative for adenopathy. or other swelling Psychiatric/Behavioral: Negative for hallucinations, self-injury, decreased concentration or other worsening agitation.      Objective:   Physical Exam BP 120/80  Pulse 61  Temp(Src) 97.5 F (36.4 C) (Oral)  Wt 264 lb (119.75 kg)  SpO2 97% VS noted,  Constitutional: Pt is oriented to person, place, and time. Appears well-developed and well-nourished. [name] Head: Normocephalic and atraumatic.  Right Ear: External ear normal.  Left Ear: External ear normal.  Nose: Nose normal.  Mouth/Throat: Oropharynx is clear and moist.  Eyes: Conjunctivae and EOM are normal. Pupils are equal, round, and reactive to light.  TM's clear after bilat canals irrigated of wax impactions Neck: Normal range of motion. Neck supple. No JVD present.  No tracheal deviation present.  Cardiovascular: Normal rate, regular rhythm, normal heart sounds and intact distal pulses.   Pulmonary/Chest: Effort normal and breath sounds without rales or wheezing  Abdominal: Soft. Bowel sounds are normal. NT. No HSM  Musculoskeletal: Normal range of motion. Exhibits no edema.  Lymphadenopathy:  Has no cervical adenopathy.  Neurological: Pt is alert and oriented to person, place, and time. Pt has normal reflexes. No cranial nerve deficit. Motor grossly intact Skin: Skin is warm and dry. No rash noted.  Psychiatric:  Has normal mood and affect. Behavior is normal.     Assessment & Plan:

## 2013-10-23 NOTE — Assessment & Plan Note (Signed)

## 2013-10-24 LAB — HEPATIC FUNCTION PANEL
ALBUMIN: 4.5 g/dL (ref 3.5–5.2)
ALT: 38 U/L (ref 0–53)
ALT: 41 U/L (ref 0–53)
AST: 32 U/L (ref 0–37)
AST: 36 U/L (ref 0–37)
Albumin: 4.4 g/dL (ref 3.5–5.2)
Alkaline Phosphatase: 42 U/L (ref 39–117)
Alkaline Phosphatase: 42 U/L (ref 39–117)
BILIRUBIN DIRECT: 0.1 mg/dL (ref 0.0–0.3)
BILIRUBIN DIRECT: 0.1 mg/dL (ref 0.0–0.3)
BILIRUBIN TOTAL: 0.6 mg/dL (ref 0.2–1.2)
TOTAL PROTEIN: 7.9 g/dL (ref 6.0–8.3)
Total Bilirubin: 0.8 mg/dL (ref 0.2–1.2)
Total Protein: 7.7 g/dL (ref 6.0–8.3)

## 2013-10-24 LAB — BASIC METABOLIC PANEL
BUN: 21 mg/dL (ref 6–23)
BUN: 19 mg/dL (ref 6–23)
CALCIUM: 9.3 mg/dL (ref 8.4–10.5)
CHLORIDE: 100 meq/L (ref 96–112)
CO2: 24 meq/L (ref 19–32)
CO2: 28 meq/L (ref 19–32)
CREATININE: 1.2 mg/dL (ref 0.4–1.5)
CREATININE: 1.2 mg/dL (ref 0.4–1.5)
Calcium: 9.3 mg/dL (ref 8.4–10.5)
Chloride: 100 mEq/L (ref 96–112)
GFR: 63.66 mL/min (ref >60.00)
GFR: 65.58 mL/min (ref >60.00)
GLUCOSE: 181 mg/dL — AB (ref 70–99)
Glucose, Bld: 189 mg/dL — ABNORMAL HIGH (ref 70–99)
Potassium: 3.7 mEq/L (ref 3.5–5.1)
Potassium: 3.7 mEq/L (ref 3.5–5.1)
Sodium: 139 mEq/L (ref 135–145)
Sodium: 139 mEq/L (ref 135–145)

## 2013-10-24 LAB — LIPID PANEL
CHOL/HDL RATIO: 4
Cholesterol: 136 mg/dL (ref 0–200)
Cholesterol: 133 mg/dL (ref 0–200)
HDL: 37.9 mg/dL — ABNORMAL LOW (ref >39.00)
HDL: 40.1 mg/dL (ref >39.00)
NONHDL: 98.1
NONHDL: 92.9
TRIGLYCERIDES: 242 mg/dL — AB (ref 0.0–149.0)
TRIGLYCERIDES: 239 mg/dL — AB (ref 0.0–149.0)
Total CHOL/HDL Ratio: 3
VLDL: 48.4 mg/dL — ABNORMAL HIGH (ref 0.0–40.0)
VLDL: 47.8 mg/dL — AB (ref 0.0–40.0)

## 2013-10-24 LAB — LDL CHOLESTEROL, DIRECT: Direct LDL: 79.5 mg/dL

## 2013-10-24 LAB — HEMOGLOBIN A1C: Hgb A1c MFr Bld: 7.3 % — ABNORMAL HIGH (ref 4.6–6.5)

## 2013-10-29 ENCOUNTER — Other Ambulatory Visit: Payer: Self-pay

## 2013-10-29 MED ORDER — METFORMIN HCL 1000 MG PO TABS: 1000.0000 mg | ORAL_TABLET | Freq: Two times a day (BID) | ORAL | Status: DC

## 2013-11-26 ENCOUNTER — Encounter: Payer: Self-pay | Admitting: Cardiology

## 2013-11-26 DIAGNOSIS — E669 Obesity, unspecified: Secondary | ICD-10-CM | POA: Insufficient documentation

## 2013-11-26 DIAGNOSIS — I739 Peripheral vascular disease, unspecified: Secondary | ICD-10-CM

## 2013-11-26 DIAGNOSIS — Z87442 Personal history of urinary calculi: Secondary | ICD-10-CM

## 2013-11-26 DIAGNOSIS — E119 Type 2 diabetes mellitus without complications: Secondary | ICD-10-CM

## 2013-11-26 DIAGNOSIS — I10 Essential (primary) hypertension: Secondary | ICD-10-CM

## 2013-11-26 DIAGNOSIS — E785 Hyperlipidemia, unspecified: Secondary | ICD-10-CM

## 2013-11-26 DIAGNOSIS — F411 Generalized anxiety disorder: Secondary | ICD-10-CM

## 2013-11-26 NOTE — Progress Notes (Unsigned)
Patient ID: [name], male   DOB: [birth year], 79 y.o.   MRN: [id number]   [name], [name]    Date of visit:  11/26/2013 DOB:  December 14, 1934    Age:  79 yrs. Medical record number:  [id number]     Account number:  [id number] Primary Care Provider: Biagio Borg ____________________________ CURRENT DIAGNOSES  1. Hypertensive heart disease without heart failure  2. Other obesity  3. Hyperlipidemia, unspecified  4. Cardiomyopathy, unspecified  5. Occlusion and stenosis of unspecified carotid artery  6. Abdominal aortic aneurysm, without rupture  7. Type 2 diabetes mellitus without complications ____________________________ ALLERGIES  No Known Allergies ____________________________ MEDICATIONS  1. fluticasone 50 mcg/actuation Disk with Device, PRN  2. multivitamin tablet, 1 p.o. daily  3. carvedilol 12.5 mg tablet, 1/2 tab b.i.d.  4. Ocuvite 150-30-6-150 mg-unit-mg-mg capsule, 1 p.o. daily  5. Glucosamine 500 mg tablet, 1 p.o. daily  6. metformin 1,000 mg tablet, BID  7. Crestor 40 mg tablet, 1/2 tab daily  8. aspirin 81 mg chewable tablet, 1 p.o. daily ____________________________ CHIEF COMPLAINTS  Followup of Hypertensive heart disease without heart failure ____________________________ HISTORY OF PRESENT ILLNESS Patient seen for cardiac followup. He has been doing well since he was previously here. He is exercising on a regular basis and his lipids were reviewed today and show excellent control. He denies angina and has no PND, orthopnea, syncope, palpitations, or claudication. He does have an aneurysm and carotid stenosis that is being followed by the vascular surgeons. He has gained some weight since he was here but is able to play golf and do normal activities without difficulty. ____________________________ PAST HISTORY  Past Medical Illnesses:  hypertension, hyperlipidemia, carotid artery stenosis-right, obesity, nephrolithiasis, BPH;  Cardiovascular Illnesses:  Carotid artery  disease, abdominal aneurysm;  Surgical Procedures:  knee replacement-bil, cataract extraction;  Cardiology Procedures-Invasive:  no history of prior cardiac procedures;  Cardiology Procedures-Noninvasive:  treadmill cardiolite June 2008, echocardiogram August 2009, echocardiogram September 2011, echocardiogram November 2014;  LVEF of 45% documented via echocardiogram on 12/12/2012,   ____________________________ CARDIO-PULMONARY TEST DATES EKG Date:  12/03/2012;  Nuclear Study Date:  07/24/2006;  Echocardiography Date: 12/12/2012;  Chest Xray Date: 08/23/2006;   ____________________________ FAMILY HISTORY Father -- Father dead, Motor neurone disease, Deceased Mother -- Coronary Artery Disease, Renal disorder, Deceased ____________________________ SOCIAL HISTORY Alcohol Use:  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 Cardiovascular:  please review HPI Abdominal: denies dyspepsia, GI bleeding, constipation, or diarrhea Genitourinary-Male: nocturia  Musculoskeletal:  arthritis of the knees  ____________________________ PHYSICAL EXAMINATION VITAL SIGNS  Blood Pressure:  124/70 Sitting, Left arm, large cuff  , 118/76 Standing, Left arm and large cuff   Pulse:  64/min. Weight:  265.00 lbs. Height:  71"BMI: 37  Constitutional:  pleasant white male in no acute distress, mildly obese Skin:  occasional hemangiomas, maculopapular lesions feet Head:  normocephalic,  normal hair pattern, no masses or tenderness ENT:  hearing aide present left ear, ears, nose and throat unremarkable Neck:  supple, no masses,  thyromegaly, JVD. Carotid pulses are full and equal bilaterally without bruits. Chest:  normal symmetry, clear to auscultation and percussion. Cardiac:  regular rhythm, normal S1 and S2, No S3 or S4, no murmurs, gallops or rubs detected. Peripheral Pulses:  the femoral,dorsalis pedis, and posterior tibial pulses are full and equal bilaterally with no bruits auscultated. Extremities & Back:  no edema present, bilateral lower extremity venous varicosities, Healed scar from knee surgery Neurological:  no gross motor or sensory deficits noted, affect appropriate, oriented x3. ____________________________ MOST RECENT LIPID PANEL 11/22/13  CHOL TOTL 136 mg/dl, LDL 80 NM, HDL 38 mg/dl, TRIGLYCER 242 mg/dl and CHOL/HDL 4 (Calc) ____________________________ IMPRESSIONS/PLAN 1. Prior cardiomyopathy and PVCs thought due to hypertension 2. Obesity with weight gain need to lose additional weight 3. Hypertensive heart disease 4. Carotid artery disease asymptomatic followed by vascular surgeon 5. Asymptomatic aneurysm followed  Recommendations:  Echo last year showed an ejection fraction of 45%. He does have hypertensive heart disease. He is clinically doing well and I will see him back in followup in one year. Discussed importance of weight loss with him. ____________________________ TODAYS ORDERS  1. Return Visit: 1 year  2. 12 Lead EKG: 1 year                       ____________________________ Cardiology Physician:  Kerry Hough MD [facility]

## 2013-12-01 ENCOUNTER — Other Ambulatory Visit: Payer: Self-pay | Admitting: Internal Medicine

## 2013-12-11 ENCOUNTER — Ambulatory Visit (AMBULATORY_SURGERY_CENTER): Payer: Self-pay | Admitting: *Deleted

## 2013-12-11 VITALS — Ht 71.0 in | Wt 265.8 lb

## 2013-12-11 DIAGNOSIS — Z1211 Encounter for screening for malignant neoplasm of colon: Secondary | ICD-10-CM

## 2013-12-11 NOTE — Progress Notes (Signed)
No allergies to eggs or soy. No problems with anesthesia.  Pt given Emmi instructions for colonoscopy  No oxygen use  No diet drug use  

## 2013-12-12 ENCOUNTER — Encounter: Payer: Self-pay | Admitting: Internal Medicine

## 2013-12-25 ENCOUNTER — Encounter: Payer: Medicare HMO | Admitting: Gastroenterology

## 2014-02-14 ENCOUNTER — Encounter: Payer: Self-pay | Admitting: Internal Medicine

## 2014-02-14 ENCOUNTER — Ambulatory Visit (AMBULATORY_SURGERY_CENTER): Payer: Medicare HMO | Admitting: Internal Medicine

## 2014-02-14 VITALS — BP 146/103 | HR 59 | Temp 97.5°F | Resp 13 | Ht 71.0 in | Wt 265.0 lb

## 2014-02-14 DIAGNOSIS — Z1211 Encounter for screening for malignant neoplasm of colon: Secondary | ICD-10-CM

## 2014-02-14 LAB — GLUCOSE, CAPILLARY
GLUCOSE-CAPILLARY: 139 mg/dL — AB (ref 70–99)
GLUCOSE-CAPILLARY: 112 mg/dL — AB (ref 70–99)

## 2014-02-14 MED ORDER — SODIUM CHLORIDE 0.9 % IV SOLN: 500.0000 mL | INTRAVENOUS | Status: DC

## 2014-02-14 NOTE — Patient Instructions (Addendum)
No polyps again!  You do have a condition called diverticulosis - common and not usually a problem. Please read the handout provided. You do have some hemorrhoids also. You do not need further routine colonoscopy or other colon cancer screening.  I appreciate the opportunity to care for you. Gatha Mayer, MD, FACG  YOU HAD AN ENDOSCOPIC PROCEDURE TODAY AT [facility]: Refer to the procedure report that was given to you for any specific questions about what was found during the examination.  If the procedure report does not answer your questions, please call your gastroenterologist to clarify.  If you requested that your care partner not be given the details of your procedure findings, then the procedure report has been included in a sealed envelope for you to review at your convenience later.  YOU SHOULD EXPECT: Some feelings of bloating in the abdomen. Passage of more gas than usual.  Walking can help get rid of the air that was put into your GI tract during the procedure and reduce the bloating. If you had a lower endoscopy (such as a colonoscopy or flexible sigmoidoscopy) you may notice spotting of blood in your stool or on the toilet paper. If you underwent a bowel prep for your procedure, then you may not have a normal bowel movement for a few days.  DIET: Your first meal following the procedure should be a light meal and then it is ok to progress to your normal diet.  A half-sandwich or bowl of soup is an example of a good first meal.  Heavy or fried foods are harder to digest and may make you feel nauseous or bloated.  Likewise meals heavy in dairy and vegetables can cause extra gas to form and this can also increase the bloating.  Drink plenty of fluids but you should avoid alcoholic beverages for 24 hours.  ACTIVITY: Your care partner should take you home directly after the procedure.  You should plan to take it easy, moving slowly for the rest of the day.  You  can resume normal activity the day after the procedure however you should NOT DRIVE or use heavy machinery for 24 hours (because of the sedation medicines used during the test).    SYMPTOMS TO REPORT IMMEDIATELY: A gastroenterologist can be reached at any hour.  During normal business hours, 8:30 AM to 5:00 PM Monday through Friday, call [phone].  After hours and on weekends, please call the GI answering service at [phone] who will take a message and have the physician on call contact you.   Following lower endoscopy (colonoscopy or flexible sigmoidoscopy):  Excessive amounts of blood in the stool  Significant tenderness or worsening of abdominal pains  Swelling of the abdomen that is new, acute  Fever of 100F or higher   FOLLOW UP: If any biopsies were taken you will be contacted by phone or by letter within the next 1-3 weeks.  Call your gastroenterologist if you have not heard about the biopsies in 3 weeks.  Our staff will call the home number listed on your records the next business day following your procedure to check on you and address any questions or concerns that you may have at that time regarding the information given to you following your procedure. This is a courtesy call and so if there is no answer at the home number and we have not heard from you through the emergency physician on call, we will assume that you have  returned to your regular daily activities without incident.  SIGNATURES/CONFIDENTIALITY: You and/or your care partner have signed paperwork which will be entered into your electronic medical record.  These signatures attest to the fact that that the information above on your After Visit Summary has been reviewed and is understood.  Full responsibility of the confidentiality of this discharge information lies with you and/or your care-partner.  Hemorrhoid and diverticulosis information given.

## 2014-02-14 NOTE — Progress Notes (Signed)
Procedure ends, to recovery, report given and VSS. 

## 2014-02-14 NOTE — Op Note (Signed)
[employer]  [address] [place], [address]   COLONOSCOPY PROCEDURE REPORT  PATIENT: [name], [name]  MR#: [id number] BIRTHDATE: 12-14-1934 , 79  yrs. old GENDER: male ENDOSCOPIST: Gatha Mayer, MD, [facility] PROCEDURE DATE:  02/14/2014 PROCEDURE:   Colonoscopy, screening First Screening Colonoscopy - Avg.  risk and is 50 yrs.  old or older - No.  Prior Negative Screening - Now for repeat screening. 10 or more years since last screening  History of Adenoma - Now for follow-up colonoscopy & has been > or = to 3 yrs.  N/A  Polyps Removed Today? No.  Polyps Removed Today? No.  Recommend repeat exam, <10 yrs? Polyps Removed Today? No.  Recommend repeat exam, <10 yrs? No. ASA CLASS:   Class III INDICATIONS:average risk for colorectal cancer. MEDICATIONS: Propofol 200 mg IV and Monitored anesthesia care  DESCRIPTION OF PROCEDURE:   After the risks benefits and alternatives of the procedure were thoroughly explained, informed consent was obtained.  The digital rectal exam revealed no abnormalities of the rectum, revealed the prostate was not enlarged, and revealed no prostatic nodules.   The [id number]  endoscope was introduced through the anus and advanced to the cecum, which was identified by both the appendix and ileocecal valve. No adverse events experienced.   The quality of the prep was good, using MiraLax  The instrument was then slowly withdrawn as the colon was fully examined.      COLON FINDINGS: There was moderate diverticulosis noted in the sigmoid colon.   The examination was otherwise normal.  Retroflexed views revealed internal hemorrhoids. The time to cecum=2 minutes 49 seconds.  Withdrawal time=11 minutes 46 seconds.  The scope was withdrawn and the procedure completed. COMPLICATIONS: There were no immediate complications.  ENDOSCOPIC IMPRESSION: Moderate diverticulosis was noted in the sigmoid colon - otherwise normal colon Internal  hemorrhoids in rectum  RECOMMENDATIONS: See GI as needed - does not need routine repeat colon screening  eSigned:  Gatha Mayer, MD, [facility] 02/14/2014 11:08 AM   cc: The Patient

## 2014-02-17 ENCOUNTER — Other Ambulatory Visit: Payer: Self-pay | Admitting: Internal Medicine

## 2014-02-17 ENCOUNTER — Telehealth: Payer: Self-pay | Admitting: *Deleted

## 2014-02-17 NOTE — Telephone Encounter (Signed)
  Follow up Call-  Call back number 02/14/2014  Post procedure Call Back phone  # [phone]  Permission to leave phone message Yes     Patient questions:  Message left to call us if necessary.

## 2014-04-16 ENCOUNTER — Other Ambulatory Visit: Payer: Self-pay | Admitting: Internal Medicine

## 2014-04-22 ENCOUNTER — Other Ambulatory Visit (INDEPENDENT_AMBULATORY_CARE_PROVIDER_SITE_OTHER): Payer: Medicare HMO

## 2014-04-22 ENCOUNTER — Other Ambulatory Visit: Payer: Self-pay

## 2014-04-22 DIAGNOSIS — E1165 Type 2 diabetes mellitus with hyperglycemia: Secondary | ICD-10-CM

## 2014-04-22 DIAGNOSIS — IMO0002 Reserved for concepts with insufficient information to code with codable children: Secondary | ICD-10-CM

## 2014-04-22 DIAGNOSIS — Z87442 Personal history of urinary calculi: Secondary | ICD-10-CM

## 2014-04-22 DIAGNOSIS — Z125 Encounter for screening for malignant neoplasm of prostate: Secondary | ICD-10-CM

## 2014-04-22 LAB — LIPID PANEL
Cholesterol: 134 mg/dL (ref 0–200)
HDL: 39.6 mg/dL (ref >39.00)
NonHDL: 94.4
TRIGLYCERIDES: 222 mg/dL — AB (ref 0.0–149.0)
Total CHOL/HDL Ratio: 3
VLDL: 44.4 mg/dL — AB (ref 0.0–40.0)

## 2014-04-22 LAB — BASIC METABOLIC PANEL
BUN: 21 mg/dL (ref 6–23)
CO2: 25 mEq/L (ref 19–32)
Calcium: 9.5 mg/dL (ref 8.4–10.5)
Chloride: 104 mEq/L (ref 96–112)
Creatinine, Ser: 0.94 mg/dL (ref 0.40–1.50)
GFR: 82.65 mL/min (ref >60.00)
Glucose, Bld: 176 mg/dL — ABNORMAL HIGH (ref 70–99)
Potassium: 4.1 mEq/L (ref 3.5–5.1)
Sodium: 138 mEq/L (ref 135–145)

## 2014-04-22 LAB — HEPATIC FUNCTION PANEL
ALBUMIN: 4.5 g/dL (ref 3.5–5.2)
ALT: 28 U/L (ref 0–53)
AST: 22 U/L (ref 0–37)
Alkaline Phosphatase: 41 U/L (ref 39–117)
BILIRUBIN TOTAL: 0.5 mg/dL (ref 0.2–1.2)
Bilirubin, Direct: 0.1 mg/dL (ref 0.0–0.3)
TOTAL PROTEIN: 7 g/dL (ref 6.0–8.3)

## 2014-04-22 LAB — HEMOGLOBIN A1C: Hgb A1c MFr Bld: 7.7 % — ABNORMAL HIGH (ref 4.6–6.5)

## 2014-04-22 LAB — URINALYSIS, ROUTINE W REFLEX MICROSCOPIC
Bilirubin Urine: NEGATIVE
Hgb urine dipstick: NEGATIVE
Ketones, ur: NEGATIVE
NITRITE: NEGATIVE
Specific Gravity, Urine: 1.025 (ref 1.000–1.030)
TOTAL PROTEIN, URINE-UPE24: NEGATIVE
UROBILINOGEN UA: 0.2 (ref 0.0–1.0)
Urine Glucose: NEGATIVE
pH: 6 (ref 5.0–8.0)

## 2014-04-22 LAB — LDL CHOLESTEROL, DIRECT: Direct LDL: 73 mg/dL

## 2014-04-22 LAB — PSA, MEDICARE: PSA: 1.59 ng/mL (ref 0.10–4.00)

## 2014-04-23 ENCOUNTER — Ambulatory Visit (INDEPENDENT_AMBULATORY_CARE_PROVIDER_SITE_OTHER): Payer: Medicare HMO | Admitting: Internal Medicine

## 2014-04-23 ENCOUNTER — Encounter: Payer: Self-pay | Admitting: Internal Medicine

## 2014-04-23 VITALS — BP 122/80 | HR 59 | Temp 98.0°F | Resp 18 | Ht 71.0 in | Wt 267.0 lb

## 2014-04-23 DIAGNOSIS — Z0189 Encounter for other specified special examinations: Secondary | ICD-10-CM

## 2014-04-23 DIAGNOSIS — Z0001 Encounter for general adult medical examination with abnormal findings: Secondary | ICD-10-CM | POA: Insufficient documentation

## 2014-04-23 DIAGNOSIS — Z0000 Encounter for general adult medical examination without abnormal findings: Secondary | ICD-10-CM

## 2014-04-23 DIAGNOSIS — E119 Type 2 diabetes mellitus without complications: Secondary | ICD-10-CM

## 2014-04-23 MED ORDER — GLIPIZIDE ER 2.5 MG PO TB24: 2.5000 mg | ORAL_TABLET | Freq: Every day | ORAL | Status: DC

## 2014-04-23 NOTE — Assessment & Plan Note (Signed)
Mild uncontrolled, for add glipizide ER 2.5 qd, cont all other med, work on diet, wt loss, exercise  Lab Results  Component Value Date   HGBA1C 7.7* 04/22/2014

## 2014-04-23 NOTE — Assessment & Plan Note (Signed)

## 2014-04-23 NOTE — Progress Notes (Signed)
Pre visit review using our clinic review tool, if applicable. No additional management support is needed unless otherwise documented below in the visit note. 

## 2014-04-23 NOTE — Patient Instructions (Signed)
Please take all new medication as prescribed - the glipizide ER 2.5 mg per day  Please continue all other medications as before, and refills have been done if requested.  Please have the pharmacy call with any other refills you may need.  Please continue your efforts at being more active, low cholesterol diet, and weight control.  You are otherwise up to date with prevention measures today.  Please keep your appointments with your specialists as you may have planned  Please return in 6 months, or sooner if needed, with Lab testing done 3-5 days before

## 2014-04-23 NOTE — Progress Notes (Signed)
Subjective:    Patient ID: [name], male    DOB: May 22, 1934, 80 y.o.   MRN: [id number]  HPI Here for wellness and f/u;  Overall doing ok;  Pt denies Chest pain, worsening SOB, DOE, wheezing, orthopnea, PND, worsening LE edema, palpitations, dizziness or syncope.  Pt denies neurological change such as new headache, facial or extremity weakness.  Pt denies polydipsia, polyuria, or low sugar symptoms. Pt states overall good compliance with treatment and medications, good tolerability, and has been trying to follow appropriate diet.  Pt denies worsening depressive symptoms, suicidal ideation or panic. No fever, night sweats, wt loss, loss of appetite, or other constitutional symptoms.  Pt states good ability with ADL's, has low fall risk, home safety reviewed and adequate, no other significant changes in hearing or vision, and only occasionally active with exercise. Plays golf occas.  Has left groin skin tag , inhibits his walking for exercise.  Has gained several lbs Wt Readings from Last 3 Encounters:  04/23/14 267 lb 0.6 oz (121.129 kg)  02/14/14 265 lb (120.203 kg)  12/11/13 265 lb 12.8 oz (120.566 kg)   Past Medical History  Diagnosis Date  . Type 2 diabetes mellitus   . History of kidney stones   . Diverticulosis of colon   . Allergic rhinitis   . BPH (benign prostatic hypertrophy)   . Hyperlipidemia   . Phimosis   . Hypertension   . Peripheral vascular disease   . AAA (abdominal aortic aneurysm)     MONITORED BY DR LAWSON LOV JUNE 2014--  STABLE  AT 3.7  . Asymptomatic stenosis of left carotid artery without infarction     MILD DISEASE  . Right carotid artery occlusion     CHRONIC   WITHOUT INFARTION  . Wears hearing aid   . History of cardiomyopathy     IDIOPATHIC-- PER [facility] NOTE 2013 , CURRENTLY RESOLVED  . Stroke      Right eye  . Arthritis    Past Surgical History  Procedure Laterality Date  . Total knee arthroplasty Bilateral RIGHT 10-08-2007;   LEFT  09-30-2008  . Cysto/  right ureteral stent placement  10-16-2003  . Extracorporeal shock wave lithotripsy Right 2005  . Transthoracic echocardiogram  10-21-2009    LVEF 50%  . Cardiovascular stress test  JUNE 2008  DR Wynonia Lawman  . Cataract extraction w/ intraocular lens implant Right   . Circumcision N/A 08/30/2012    Procedure: CIRCUMCISION ADULT;  Surgeon: Malka So, MD;  Location: [facility];  Service: Urology;  Laterality: N/A;  . Eye surgery    . Joint replacement Bilateral 2008  2009    Knee    reports that he quit smoking about 31 years ago. His smoking use included Cigarettes. He has a 18.75 pack-year smoking history. He has never used smokeless tobacco. He reports that he does not drink alcohol or use illicit drugs. family history includes ALS in his father; Asthma in his mother; COPD in his mother; Depression in his mother; Heart disease in his mother; Hypertension in his mother; Kidney disease in his mother. There is no history of Colon cancer. No Known Allergies Current Outpatient Prescriptions on File Prior to Visit  Medication Sig Dispense Refill  . aspirin 81 MG tablet Take 81 mg by mouth daily.     . carvedilol (COREG) 12.5 MG tablet TAKE 1/2 TABLET BY MOUTH TWICE A DAY WITH A MEAL 90 tablet 3  . Glucosamine-Chondroit-Vit C-Mn (GLUCOSAMINE 1500  COMPLEX PO) Take 2 tablets by mouth daily.     . hydrochlorothiazide (HYDRODIURIL) 25 MG tablet TAKE ONE (1) TABLET EACH DAY 90 tablet 2  . metFORMIN (GLUCOPHAGE) 1000 MG tablet Take 1,000 mg by mouth 2 (two) times daily with a meal.    . Multiple Vitamin (MULTIVITAMIN) capsule Take 1 capsule by mouth daily.      . rosuvastatin (CRESTOR) 40 MG tablet Take 20 mg by mouth every morning.    Marland Kitchen CRESTOR 40 MG tablet TAKE 1 TABLET (40 MG TOTAL) BY MOUTH DAILY. 90 tablet 3   No current facility-administered medications on file prior to visit.   Review of Systems Constitutional: Negative for increased diaphoresis, other  activity, appetite or siginficant weight change other than noted HENT: Negative for worsening hearing loss, ear pain, facial swelling, mouth sores and neck stiffness.   Eyes: Negative for other worsening pain, redness or visual disturbance.  Respiratory: Negative for shortness of breath and wheezing  Cardiovascular: Negative for chest pain and palpitations.  Gastrointestinal: Negative for diarrhea, blood in stool, abdominal distention or other pain Genitourinary: Negative for hematuria, flank pain or change in urine volume.  Musculoskeletal: Negative for myalgias or other joint complaints.  Skin: Negative for color change and wound or drainage.  Neurological: Negative for syncope and numbness. other than noted Hematological: Negative for adenopathy. or other swelling Psychiatric/Behavioral: Negative for hallucinations, SI, self-injury, decreased concentration or other worsening agitation.      Objective:   Physical Exam BP 122/80 mmHg  Pulse 59  Temp(Src) 98 F (36.7 C) (Oral)  Resp 18  Ht 5\' 11"  (1.803 m)  Wt 267 lb 0.6 oz (121.129 kg)  BMI 37.26 kg/m2  SpO2 98% VS noted,  Constitutional: Pt is oriented to person, place, and time. Appears well-developed and well-nourished, in no significant distres/morbid obese Head: Normocephalic and atraumatic.  Right Ear: External ear normal.  Left Ear: External ear normal.  Nose: Nose normal.  Mouth/Throat: Oropharynx is clear and moist.  Eyes: Conjunctivae and EOM are normal. Pupils are equal, round, and reactive to light.  Neck: Normal range of motion. Neck supple. No JVD present. No tracheal deviation present or significant neck LA or mass Cardiovascular: Normal rate, regular rhythm, normal heart sounds and intact distal pulses.   Pulmonary/Chest: Effort normal and breath sounds without rales or wheezing  Abdominal: Soft. Bowel sounds are normal. NT. No HSM  S.p bilat TKR Musculoskeletal: Normal range of motion except bilat knee mild  decreased. Exhibits no edema.  Lymphadenopathy:  Has no cervical adenopathy.  Neurological: Pt is alert and oriented to person, place, and time. Pt has normal reflexes. No cranial nerve deficit. Motor grossly intact Skin: Skin is warm and dry. No rash noted.  Psychiatric:  Has normal mood and affect. Behavior is normal.     Assessment & Plan:

## 2014-06-26 ENCOUNTER — Other Ambulatory Visit: Payer: Self-pay

## 2014-06-26 ENCOUNTER — Telehealth: Payer: Self-pay | Admitting: Internal Medicine

## 2014-06-26 MED ORDER — ROSUVASTATIN CALCIUM 40 MG PO TABS: ORAL_TABLET | ORAL | Status: DC

## 2014-06-26 NOTE — Telephone Encounter (Signed)
Medication was sent in to [facility].

## 2014-06-26 NOTE — Telephone Encounter (Signed)
Requesting script for crestor to be sent over

## 2014-07-25 ENCOUNTER — Encounter: Payer: Self-pay | Admitting: Family

## 2014-07-29 ENCOUNTER — Ambulatory Visit (HOSPITAL_COMMUNITY)
Admission: RE | Admit: 2014-07-29 | Discharge: 2014-07-29 | Disposition: A | Discharge status: Home / Self Care | Payer: Medicare HMO | Source: Ambulatory Visit | Attending: Family | Admitting: Family

## 2014-07-29 ENCOUNTER — Ambulatory Visit (INDEPENDENT_AMBULATORY_CARE_PROVIDER_SITE_OTHER): Payer: Medicare HMO | Admitting: Family

## 2014-07-29 ENCOUNTER — Encounter: Payer: Self-pay | Admitting: Family

## 2014-07-29 ENCOUNTER — Ambulatory Visit (INDEPENDENT_AMBULATORY_CARE_PROVIDER_SITE_OTHER)
Admission: RE | Admit: 2014-07-29 | Discharge: 2014-07-29 | Disposition: A | Discharge status: Home / Self Care | Payer: Medicare HMO | Source: Ambulatory Visit | Attending: Family | Admitting: Family

## 2014-07-29 VITALS — BP 136/85 | HR 52 | Resp 14 | Ht 71.0 in | Wt 260.0 lb

## 2014-07-29 DIAGNOSIS — Z87891 Personal history of nicotine dependence: Secondary | ICD-10-CM

## 2014-07-29 DIAGNOSIS — I714 Abdominal aortic aneurysm, without rupture, unspecified: Secondary | ICD-10-CM

## 2014-07-29 DIAGNOSIS — I6521 Occlusion and stenosis of right carotid artery: Secondary | ICD-10-CM

## 2014-07-29 DIAGNOSIS — Z48812 Encounter for surgical aftercare following surgery on the circulatory system: Secondary | ICD-10-CM | POA: Diagnosis not present

## 2014-07-29 DIAGNOSIS — I6523 Occlusion and stenosis of bilateral carotid arteries: Secondary | ICD-10-CM | POA: Insufficient documentation

## 2014-07-29 NOTE — Patient Instructions (Signed)
Stroke Prevention Some medical conditions and behaviors are associated with an increased chance of having a stroke. You may prevent a stroke by making healthy choices and managing medical conditions. HOW CAN I REDUCE MY RISK OF HAVING A STROKE?   Stay physically active. Get at least 30 minutes of activity on most or all days.  Do not smoke. It may also be helpful to avoid exposure to secondhand smoke.  Limit alcohol use. Moderate alcohol use is considered to be:  No more than 2 drinks per day for men.  No more than 1 drink per day for nonpregnant women.  Eat healthy foods. This involves:  Eating 5 or more servings of fruits and vegetables a day.  Making dietary changes that address high blood pressure (hypertension), high cholesterol, diabetes, or obesity.  Manage your cholesterol levels.  Making food choices that are high in fiber and low in saturated fat, trans fat, and cholesterol may control cholesterol levels.  Take any prescribed medicines to control cholesterol as directed by your health care provider.  Manage your diabetes.  Controlling your carbohydrate and sugar intake is recommended to manage diabetes.  Take any prescribed medicines to control diabetes as directed by your health care provider.  Control your hypertension.  Making food choices that are low in salt (sodium), saturated fat, trans fat, and cholesterol is recommended to manage hypertension.  Take any prescribed medicines to control hypertension as directed by your health care provider.  Maintain a healthy weight.  Reducing calorie intake and making food choices that are low in sodium, saturated fat, trans fat, and cholesterol are recommended to manage weight.  Stop drug abuse.  Avoid taking birth control pills.  Talk to your health care provider about the risks of taking birth control pills if you are over 35 years old, smoke, get migraines, or have ever had a blood clot.  Get evaluated for sleep  disorders (sleep apnea).  Talk to your health care provider about getting a sleep evaluation if you snore a lot or have excessive sleepiness.  Take medicines only as directed by your health care provider.  For some people, aspirin or blood thinners (anticoagulants) are helpful in reducing the risk of forming abnormal blood clots that can lead to stroke. If you have the irregular heart rhythm of atrial fibrillation, you should be on a blood thinner unless there is a good reason you cannot take them.  Understand all your medicine instructions.  Make sure that other conditions (such as anemia or atherosclerosis) are addressed. SEEK IMMEDIATE MEDICAL CARE IF:   You have sudden weakness or numbness of the face, arm, or leg, especially on one side of the body.  Your face or eyelid droops to one side.  You have sudden confusion.  You have trouble speaking (aphasia) or understanding.  You have sudden trouble seeing in one or both eyes.  You have sudden trouble walking.  You have dizziness.  You have a loss of balance or coordination.  You have a sudden, severe headache with no known cause.  You have new chest pain or an irregular heartbeat. Any of these symptoms may represent a serious problem that is an emergency. Do not wait to see if the symptoms will go away. Get medical help at once. Call your local emergency services (911 in [place] Do not drive yourself to the hospital. Document Released: 03/03/2004 Document Revised: 06/10/2013 Document Reviewed: 07/27/2012 Exit[employer] Patient Information 2015 [employer]. This information is not intended to replace advice given   to you by your health care provider. Make sure you discuss any questions you have with your health care provider.    Abdominal Aortic Aneurysm An aneurysm is a weakened or damaged part of an artery wall that bulges from the normal force of blood pumping through the body. An abdominal aortic aneurysm is an aneurysm that  occurs in the lower part of the aorta, the main artery of the body.  The major concern with an abdominal aortic aneurysm is that it can enlarge and burst (rupture) or blood can flow between the layers of the wall of the aorta through a tear (aorticdissection). Both of these conditions can cause bleeding inside the body and can be life threatening unless diagnosed and treated promptly. CAUSES  The exact cause of an abdominal aortic aneurysm is unknown. Some contributing factors are:   A hardening of the arteries caused by the buildup of fat and other substances in the lining of a blood vessel (arteriosclerosis).  Inflammation of the walls of an artery (arteritis).   Connective tissue diseases, such as Marfan syndrome.   Abdominal trauma.   An infection, such as syphilis or staphylococcus, in the wall of the aorta (infectious aortitis) caused by bacteria. RISK FACTORS  Risk factors that contribute to an abdominal aortic aneurysm may include:  Age older than 46 years.   High blood pressure (hypertension).  Male gender.  Ethnicity (white race).  Obesity.  Family history of aneurysm (first degree relatives only).  Tobacco use. PREVENTION  The following healthy lifestyle habits may help decrease your risk of abdominal aortic aneurysm:  Quitting smoking. Smoking can raise your blood pressure and cause arteriosclerosis.  Limiting or avoiding alcohol.  Keeping your blood pressure, blood sugar level, and cholesterol levels within normal limits.  Decreasing your salt intake. In somepeople, too much salt can raise blood pressure and increase your risk of abdominal aortic aneurysm.  Eating a diet low in saturated fats and cholesterol.  Increasing your fiber intake by including whole grains, vegetables, and fruits in your diet. Eating these foods may help lower blood pressure.  Maintaining a healthy weight.  Staying physically active and exercising regularly. SYMPTOMS  The  symptoms of abdominal aortic aneurysm may vary depending on the size and rate of growth of the aneurysm.Most grow slowly and do not have any symptoms. When symptoms do occur, they may include:  Pain (abdomen, side, lower back, or groin). The pain may vary in intensity. A sudden onset of severe pain may indicate that the aneurysm has ruptured.  Feeling full after eating only small amounts of food.  Nausea or vomiting or both.  Feeling a pulsating lump in the abdomen.  Feeling faint or passing out. DIAGNOSIS  Since most unruptured abdominal aortic aneurysms have no symptoms, they are often discovered during diagnostic exams for other conditions. An aneurysm may be found during the following procedures:  Ultrasonography (A one-time screening for abdominal aortic aneurysm by ultrasonography is also recommended for all men aged 94-75 years who have ever smoked).  X-ray exams.  A computed tomography (CT).  Magnetic resonance imaging (MRI).  Angiography or arteriography. TREATMENT  Treatment of an abdominal aortic aneurysm depends on the size of your aneurysm, your age, and risk factors for rupture. Medication to control blood pressure and pain may be used to manage aneurysms smaller than 6 cm. Regular monitoring for enlargement may be recommended by your caregiver if:  The aneurysm is 3-4 cm in size (an annual ultrasonography may be recommended).  The aneurysm is 4-4.5 cm in size (an ultrasonography every 6 months may be recommended).  The aneurysm is larger than 4.5 cm in size (your caregiver may ask that you be examined by a vascular surgeon). If your aneurysm is larger than 6 cm, surgical repair may be recommended. There are two main methods for repair of an aneurysm:   Endovascular repair (a minimally invasive surgery). This is done most often.  Open repair. This method is used if an endovascular repair is not possible. Document Released: 11/03/2004 Document Revised: 05/21/2012  Document Reviewed: 02/24/2012 [employer] Patient Information 2015 [employer]. This information is not intended to replace advice given to you by your health care provider. Make sure you discuss any questions you have with your health care provider.  

## 2014-07-29 NOTE — Progress Notes (Signed)
[facility] [place] HISTORY AND PHYSICAL   MRN : [id number]  History of Present Illness:   [name] is a 80 y.o. male patient of Dr. Kellie Simmering whom we are following for carotid artery occlusive disease and a small aortic aneurysm. He has a known occlusion of his left internal carotid artery, we are monitoring the right ICA. He returns today for one year follow up. 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 He denies abdominal of back pain. He denies claudication symptoms in legs with walking, denies non healing wounds, denies any new medical problems or surgeries. He takes a daily ASA, daily statin, and a beta blocker, denies taking any anticoagulants, states he has cardiomyopathy, denies any history of MI.  Pt Diabetic: Yes, states in good control Pt smoker: former smoker, quit about 1985  He is a recovering alcoholic.     Current Outpatient Prescriptions  Medication Sig Dispense Refill  . aspirin 81 MG tablet Take 81 mg by mouth daily.     . carvedilol (COREG) 12.5 MG tablet TAKE 1/2 TABLET BY MOUTH TWICE A DAY WITH A MEAL 90 tablet 3  . glipiZIDE (GLUCOTROL XL) 2.5 MG 24 hr tablet Take 1 tablet (2.5 mg total) by mouth daily with breakfast. 90 tablet 3  . Glucosamine-Chondroit-Vit C-Mn (GLUCOSAMINE 1500 COMPLEX PO) Take 2 tablets by mouth daily.     . hydrochlorothiazide (HYDRODIURIL) 25 MG tablet TAKE ONE (1) TABLET EACH DAY 90 tablet 2  . metFORMIN (GLUCOPHAGE) 1000 MG tablet Take 1,000 mg by mouth 2 (two) times daily with a meal.    . Multiple Vitamin (MULTIVITAMIN) capsule Take 1 capsule by mouth daily.      . rosuvastatin (CRESTOR) 40 MG tablet Take 20 mg by mouth every morning.     . rosuvastatin (CRESTOR) 40 MG tablet TAKE 1 TABLET (40 MG TOTAL) BY MOUTH DAILY. 90 tablet 3   No current facility-administered medications for this visit.    Past Medical History  Diagnosis Date  . Type 2 diabetes mellitus   . History of kidney stones   . Diverticulosis of colon   . Allergic rhinitis   . BPH (benign prostatic hypertrophy)   . Hyperlipidemia   . Phimosis   . Hypertension   . Peripheral vascular disease   . AAA (abdominal aortic aneurysm)     MONITORED BY DR LAWSON LOV JUNE 2014--  STABLE  AT 3.7  . Asymptomatic stenosis of left carotid artery without infarction     MILD DISEASE  . Right carotid artery occlusion     CHRONIC   WITHOUT INFARTION  . Wears hearing aid   . History of cardiomyopathy     IDIOPATHIC-- PER [facility] NOTE 2013 , CURRENTLY RESOLVED  . Stroke      Right eye  . Arthritis     Social History History  Substance Use Topics  . Smoking status: Former Smoker -- 0.75 packs/day for 25 years    Types: Cigarettes    Quit date: 07/25/1982  . Smokeless tobacco: Never Used  . Alcohol Use: No     Comment: recovering alcoholic  [id number]    Family History Family History  Problem Relation Age of Onset  .  Heart disease Mother     CHF  Before age 48  . COPD Mother   . Kidney disease Mother   . Hypertension Mother   . Asthma Mother   . Depression Mother   . ALS Father   . Colon cancer Neg Hx     Surgical History Past Surgical History  Procedure Laterality Date  . Total knee arthroplasty Bilateral RIGHT 10-08-2007;   LEFT 09-30-2008  . Cysto/  right ureteral stent placement  10-16-2003  . Extracorporeal shock wave lithotripsy Right 2005  . Transthoracic echocardiogram  10-21-2009    LVEF 50%  . Cardiovascular stress test  JUNE 2008  DR Wynonia Lawman  . Cataract extraction w/ intraocular lens implant Right   . Circumcision N/A 08/30/2012    Procedure: CIRCUMCISION ADULT;  Surgeon: Malka So, MD;  Location[facility];  Service:  Urology;  Laterality: N/A;  . Eye surgery    . Joint replacement Bilateral 2008  2009    Knee    No Known Allergies  Current Outpatient Prescriptions  Medication Sig Dispense Refill  . aspirin 81 MG tablet Take 81 mg by mouth daily.     . carvedilol (COREG) 12.5 MG tablet TAKE 1/2 TABLET BY MOUTH TWICE A DAY WITH A MEAL 90 tablet 3  . glipiZIDE (GLUCOTROL XL) 2.5 MG 24 hr tablet Take 1 tablet (2.5 mg total) by mouth daily with breakfast. 90 tablet 3  . Glucosamine-Chondroit-Vit C-Mn (GLUCOSAMINE 1500 COMPLEX PO) Take 2 tablets by mouth daily.     . hydrochlorothiazide (HYDRODIURIL) 25 MG tablet TAKE ONE (1) TABLET EACH DAY 90 tablet 2  . metFORMIN (GLUCOPHAGE) 1000 MG tablet Take 1,000 mg by mouth 2 (two) times daily with a meal.    . Multiple Vitamin (MULTIVITAMIN) capsule Take 1 capsule by mouth daily.      . rosuvastatin (CRESTOR) 40 MG tablet Take 20 mg by mouth every morning.    . rosuvastatin (CRESTOR) 40 MG tablet TAKE 1 TABLET (40 MG TOTAL) BY MOUTH DAILY. 90 tablet 3   No current facility-administered medications for this visit.     REVIEW OF SYSTEMS: See HPI for pertinent positives and negatives.  Physical Examination  Filed Vitals:   07/29/14 1120 07/29/14 1124  BP: 128/73 136/85  Pulse: 52 52  Resp:  14  Height:  5\' 11"  (1.803 m)  Weight:  260 lb (117.935 kg)  SpO2:  99%   Body mass index is 36.28 kg/(m^2).   General: WDWN obese male in NAD Gait: Normal HENT: WNL Eyes: Pupils equal Pulmonary: normal non-labored breathing , without Rales, rhonchi, wheezing Cardiac: RRR, no murmur detected  Abdomen: soft, NT, reducible asymptomatic ventral hernia Skin: no ulcers; no Gangrene , no cellulitis; no open wounds. Rash on lower legs and feet that pt states is athletes foot under treatment by his PCP.  VASCULAR EXAM  Carotid Bruits Left Right   Negative Negative   Radial pulses are 2+ palpable and =   VASCULAR  EXAM: Extremities without ischemic changes  without Gangrene; without open wounds, onchomycosis of toenails, pruritic mild macular rash on feet, using topical prescribed cream which helps.     LE Pulses LEFT RIGHT   FEMORAL 2+ palpable 2+ palpable    POPLITEAL not palpable  not palpable   POSTERIOR TIBIAL not palpable  2+ palpable    DORSALIS PEDIS  ANTERIOR TIBIAL faintly palpable  1+ palpable     Musculoskeletal: no muscle wasting or atrophy; no peripheral edema  Neurologic: A&O X 3; Appropriate Affect ;  MOTOR FUNCTION: 5/5 Symmetric, CN 2-12 intact except for some hearing loss. Speech is fluent/normal         Non-Invasive Vascular Imaging (07/29/2014):  ABDOMINAL AORTA DUPLEX EVALUATION    INDICATION: Abdominal aortic aneurysm    PREVIOUS INTERVENTION(S):     DUPLEX EXAM:     LOCATION DIAMETER AP (cm) DIAMETER TRANSVERSE (cm) VELOCITIES (cm/sec)  Aorta Proximal 2.9 2.7 47  Aorta Mid 2.6 2.9 65  Aorta Distal 3.6 3.7 43  Right Common Iliac Artery 1.4 1.4 126  Left Common Iliac Artery 1.5 1.5 68    Previous max aortic diameter:  3.32cm Date: 07/23/13     ADDITIONAL FINDINGS: . Decreased visualization of the abdominal vasculature due to overlying bowel gas and patient body habitus. . No hemodynamically significant stenosis of the abdominal aorta and bilateral proximal common iliac arteries.    IMPRESSION: Aneurysmal dilatation of the distal abdominal aorta with a maximum diameter of 3.7cm.    Compared to the previous exam:  No significant change in the abdominal aortic aneurysm when compared to the previous exams.       CEREBROVASCULAR DUPLEX EVALUATION    INDICATION: Carotid disease    PREVIOUS INTERVENTION(S):     DUPLEX EXAM:     RIGHT  LEFT  Peak Systolic  Velocities (cm/s) End Diastolic Velocities (cm/s) Plaque LOCATION Peak Systolic Velocities (cm/s) End Diastolic Velocities (cm/s) Plaque  59 7  CCA PROXIMAL 86 28 HT  41 7  CCA MID 72 25   39 9  CCA DISTAL 69 25 HT  95 14 CP ECA 86 18 CP  Occluded  HT ICA PROXIMAL 103 37 CP  Occluded  HT ICA MID 61 27   Occluded  HT ICA DISTAL 47 15     Not Calculated ICA / CCA Ratio (PSV) 1.5  Antegrade Vertebral Flow Antegrade   Brachial Systolic Pressure (mmHg)   Multiphasic (subclavian artery) Brachial Artery Waveforms Multiphasic (subclavian artery)    Plaque Morphology:  HM = Homogeneous, HT = Heterogeneous, CP = Calcific Plaque, SP = Smooth Plaque, IP = Irregular Plaque     ADDITIONAL FINDINGS: No significant stenosis of the bilateral external or common carotid arteries.    IMPRESSION: 1. Doppler velocities suggest a less than 40% stenosis of the left proximal internal carotid artery. 2. Known right internal carotid artery occlusion noted.    Compared to the previous exam:  No significant change noted when compared to the previous exam on 07/23/13.       ASSESSMENT:  [name] is a 80 y.o. male  whom we are following for carotid artery occlusive disease and a small aortic aneurysm. He has a known occlusion of his left internal carotid artery, we are monitoring the right ICA. He had an embolism in the right eye about 2003 or 2005, no subsequent stoke or TIA. He has no back or abdominal pain referable to AAA.  Today's carotid Duplex suggests  less than 40% stenosis of the left proximal internal carotid artery and known right internal carotid artery occlusion. No significant change noted when compared to the previous exam on 07/23/13.   Today's AAA Duplex suggests aneurysmal dilatation of the distal abdominal aorta with a maximum diameter of 3.7cm.  Decreased visualization of the abdominal vasculature due to overlying bowel gas and patient body habitus. No hemodynamically significant  stenosis of the abdominal aorta and bilateral proximal common iliac arteries. No significant change noted when compared to  the previous exam on 07/23/13.      PLAN:   Based on today's exam and non-invasive vascular lab results, the patient will follow up in 1 year with the following tests: AAA Duplex and carotid Duplex. I discussed in depth with the patient the nature of atherosclerosis, and emphasized the importance of maximal medical management including strict control of blood pressure, blood glucose, and lipid levels, obtaining regular exercise, and cessation of smoking.  The patient is aware that without maximal medical management the underlying atherosclerotic disease process will progress, limiting the benefit of any interventions. Consideration for repair of AAA would be made when the size approaches 4.8 or 5.0 cm, growth > 1 cm/yr, and symptomatic status. The patient was given information about stroke prevention and what symptoms should prompt the patient to seek immediate medical care. The patient was given information about AAA including signs, symptoms, treatment,  what symptoms should prompt the patient to seek immediate medical care, and how to minimize the risk of enlargement and rupture of aneurysms.  Thank you for allowing [place] to participate in this patient's care.  Clemon Chambers, RN, MSN, FNP-C [facility] Office: [phone]  Clinic MD: Kellie Simmering  07/29/2014 10:17 AM

## 2014-09-19 ENCOUNTER — Other Ambulatory Visit: Payer: Self-pay | Admitting: Internal Medicine

## 2014-10-22 ENCOUNTER — Other Ambulatory Visit (INDEPENDENT_AMBULATORY_CARE_PROVIDER_SITE_OTHER): Payer: Medicare HMO

## 2014-10-22 DIAGNOSIS — E119 Type 2 diabetes mellitus without complications: Secondary | ICD-10-CM

## 2014-10-22 DIAGNOSIS — E785 Hyperlipidemia, unspecified: Secondary | ICD-10-CM

## 2014-10-22 LAB — HEPATIC FUNCTION PANEL
ALBUMIN: 4.1 g/dL (ref 3.5–5.2)
ALT: 24 U/L (ref 0–53)
AST: 19 U/L (ref 0–37)
Alkaline Phosphatase: 38 U/L — ABNORMAL LOW (ref 39–117)
Bilirubin, Direct: 0.1 mg/dL (ref 0.0–0.3)
TOTAL PROTEIN: 7.2 g/dL (ref 6.0–8.3)
Total Bilirubin: 0.5 mg/dL (ref 0.2–1.2)

## 2014-10-22 LAB — BASIC METABOLIC PANEL
BUN: 21 mg/dL (ref 6–23)
CALCIUM: 9.6 mg/dL (ref 8.4–10.5)
CO2: 28 mEq/L (ref 19–32)
Chloride: 100 mEq/L (ref 96–112)
Creatinine, Ser: 1.06 mg/dL (ref 0.40–1.50)
GFR: 71.86 mL/min (ref >60.00)
GLUCOSE: 132 mg/dL — AB (ref 70–99)
POTASSIUM: 4.1 meq/L (ref 3.5–5.1)
Sodium: 141 mEq/L (ref 135–145)

## 2014-10-22 LAB — HEMOGLOBIN A1C: HEMOGLOBIN A1C: 6.9 % — AB (ref 4.6–6.5)

## 2014-10-22 LAB — LIPID PANEL
Cholesterol: 208 mg/dL — ABNORMAL HIGH (ref 0–200)
HDL: 43.5 mg/dL (ref >39.00)
NonHDL: 164.37
Total CHOL/HDL Ratio: 5
Triglycerides: 221 mg/dL — ABNORMAL HIGH (ref 0.0–149.0)
VLDL: 44.2 mg/dL — AB (ref 0.0–40.0)

## 2014-10-22 LAB — LDL CHOLESTEROL, DIRECT: LDL DIRECT: 147 mg/dL

## 2014-10-24 ENCOUNTER — Ambulatory Visit (INDEPENDENT_AMBULATORY_CARE_PROVIDER_SITE_OTHER): Payer: Medicare HMO | Admitting: Internal Medicine

## 2014-10-24 ENCOUNTER — Encounter: Payer: Self-pay | Admitting: Internal Medicine

## 2014-10-24 VITALS — BP 110/60 | HR 56 | Temp 98.4°F | Ht 71.0 in | Wt 268.0 lb

## 2014-10-24 DIAGNOSIS — Z23 Encounter for immunization: Secondary | ICD-10-CM | POA: Diagnosis not present

## 2014-10-24 DIAGNOSIS — E119 Type 2 diabetes mellitus without complications: Secondary | ICD-10-CM

## 2014-10-24 DIAGNOSIS — Z0000 Encounter for general adult medical examination without abnormal findings: Secondary | ICD-10-CM

## 2014-10-24 NOTE — Assessment & Plan Note (Signed)
stable overall by history and exam, recent data reviewed with pt, and pt to continue medical treatment as before,  to f/u any worsening symptoms or concerns Lab Results  Component Value Date   HGBA1C 6.9* 10/22/2014

## 2014-10-24 NOTE — Assessment & Plan Note (Signed)

## 2014-10-24 NOTE — Addendum Note (Signed)
Addended by: Lowella Dandy on: 10/24/2014 05:06 PM   Modules accepted: Orders

## 2014-10-24 NOTE — Progress Notes (Signed)
Pre visit review using our clinic review tool, if applicable. No additional management support is needed unless otherwise documented below in the visit note. 

## 2014-10-24 NOTE — Progress Notes (Signed)
Subjective:    Patient ID: [name], male    DOB: 1934-09-09, 80 y.o.   MRN: [id number]  HPI  Here for wellness and f/u;  Overall doing ok;  Pt denies Chest pain, worsening SOB, DOE, wheezing, orthopnea, PND, worsening LE edema, palpitations, dizziness or syncope.  Pt denies neurological change such as new headache, facial or extremity weakness.  Pt denies polydipsia, polyuria, or low sugar symptoms. Pt states overall good compliance with treatment and medications, good tolerability, and has been trying to follow appropriate diet.  Pt denies worsening depressive symptoms, suicidal ideation or panic. No fever, night sweats, wt loss, loss of appetite, or other constitutional symptoms.  Pt states good ability with ADL's, has low fall risk, home safety reviewed and adequate, no other significant changes in hearing or vision, and only occasionally active with exercise.  Sees Dr Estil Daft yearly. Has known right carotid occlusion, due for f/u carotids soon.  Has not been taking crestor due to cost. Has onychomycosis but also rash to distal legs, red, itchy, spotty to above the ankles. Past Medical History  Diagnosis Date  . Type 2 diabetes mellitus   . History of kidney stones   . Diverticulosis of colon   . Allergic rhinitis   . BPH (benign prostatic hypertrophy)   . Hyperlipidemia   . Phimosis   . Hypertension   . Peripheral vascular disease   . AAA (abdominal aortic aneurysm)     MONITORED BY DR LAWSON LOV JUNE 2014--  STABLE  AT 3.7  . Asymptomatic stenosis of left carotid artery without infarction     MILD DISEASE  . Right carotid artery occlusion     CHRONIC   WITHOUT INFARTION  . Wears hearing aid   . History of cardiomyopathy     IDIOPATHIC-- PER [facility] NOTE 2013 , CURRENTLY RESOLVED  . Stroke      Right eye  . Arthritis    Past Surgical History  Procedure Laterality Date  . Total knee arthroplasty Bilateral RIGHT 10-08-2007;   LEFT 09-30-2008  . Cysto/  right  ureteral stent placement  10-16-2003  . Extracorporeal shock wave lithotripsy Right 2005  . Transthoracic echocardiogram  10-21-2009    LVEF 50%  . Cardiovascular stress test  JUNE 2008  DR Wynonia Lawman  . Cataract extraction w/ intraocular lens implant Right   . Circumcision N/A 08/30/2012    Procedure: CIRCUMCISION ADULT;  Surgeon: Malka So, MD;  Location[facility];  Service: Urology;  Laterality: N/A;  . Eye surgery    . Joint replacement Bilateral 2008  2009    Knee    reports that he quit smoking about 32 years ago. His smoking use included Cigarettes. He has a 18.75 pack-year smoking history. He has never used smokeless tobacco. He reports that he does not drink alcohol or use illicit drugs. family history includes ALS in his father; Asthma in his mother; COPD in his mother; Depression in his mother; Diabetes in his father; Heart disease in his mother; Hypertension in his mother; Kidney disease in his mother. There is no history of Colon cancer. No Known Allergies Current Outpatient Prescriptions on File Prior to Visit  Medication Sig Dispense Refill  . aspirin 81 MG tablet Take 81 mg by mouth daily.     . carvedilol (COREG) 12.5 MG tablet TAKE 1/2 TABLET TWICE DAILY WITH A MEAL 90 tablet 1  . glipiZIDE (GLUCOTROL XL) 2.5 MG 24 hr tablet Take 1 tablet (2.5 mg  total) by mouth daily with breakfast. 90 tablet 3  . Glucosamine-Chondroit-Vit C-Mn (GLUCOSAMINE 1500 COMPLEX PO) Take 2 tablets by mouth daily.     . hydrochlorothiazide (HYDRODIURIL) 25 MG tablet TAKE ONE (1) TABLET EACH DAY 90 tablet 2  . metFORMIN (GLUCOPHAGE) 1000 MG tablet Take 1,000 mg by mouth 2 (two) times daily with a meal.    . Multiple Vitamin (MULTIVITAMIN) capsule Take 1 capsule by mouth daily.      . rosuvastatin (CRESTOR) 40 MG tablet Take 20 mg by mouth every morning.     No current facility-administered medications on file prior to visit.    Review of Systems Constitutional: Negative for  increased diaphoresis, other activity, appetite or siginficant weight change other than noted HENT: Negative for worsening hearing loss, ear pain, facial swelling, mouth sores and neck stiffness.   Eyes: Negative for other worsening pain, redness or visual disturbance.  Respiratory: Negative for shortness of breath and wheezing  Cardiovascular: Negative for chest pain and palpitations.  Gastrointestinal: Negative for diarrhea, blood in stool, abdominal distention or other pain Genitourinary: Negative for hematuria, flank pain or change in urine volume.  Musculoskeletal: Negative for myalgias or other joint complaints.  Skin: Negative for color change and wound or drainage.  Neurological: Negative for syncope and numbness. other than noted Hematological: Negative for adenopathy. or other swelling Psychiatric/Behavioral: Negative for hallucinations, SI, self-injury, decreased concentration or other worsening agitation.      Objective:   Physical Exam BP 110/60 mmHg  Pulse 56  Temp(Src) 98.4 F (36.9 C) (Oral)  Ht 5\' 11"  (1.803 m)  Wt 268 lb (121.564 kg)  BMI 37.39 kg/m2  SpO2 97% VS noted,  Constitutional: Pt is oriented to person, place, and time. Appears well-developed and well-nourished, in no significant distress Head: Normocephalic and atraumatic.  Right Ear: External ear normal.  Left Ear: External ear normal.  Nose: Nose normal.  Mouth/Throat: Oropharynx is clear and moist.  Eyes: Conjunctivae and EOM are normal. Pupils are equal, round, and reactive to light.  Neck: Normal range of motion. Neck supple. No JVD present. No tracheal deviation present or significant neck LA or mass Cardiovascular: Normal rate, regular rhythm, normal heart sounds and intact distal pulses.   Pulmonary/Chest: Effort normal and breath sounds without rales or wheezing  Abdominal: Soft. Bowel sounds are normal. NT. No HSM  Musculoskeletal: Normal range of motion. Exhibits no edema.  Lymphadenopathy:   Has no cervical adenopathy.  Neurological: Pt is alert and oriented to person, place, and time. Pt has normal reflexes. No cranial nerve deficit. Motor grossly intact Skin: Skin is warm and dry. No rash noted.  Psychiatric:  Has normal mood and affect. Behavior is normal.     Assessment & Plan:

## 2014-10-24 NOTE — Patient Instructions (Addendum)
Please take all new medication as prescribed - the steroid cream  OK to stop the crestor  Please take all new medication as prescribed  - the lipitor  You had the flu shot today  Please continue all other medications as before, and refills have been done if requested.  Please have the pharmacy call with any other refills you may need.  Please continue your efforts at being more active, low cholesterol diet, and weight control.  You are otherwise up to date with prevention measures today.  Please keep your appointments with your specialists as you may have planned  Please return in 6 months, or sooner if needed, with Lab testing done 3-5 days before

## 2014-10-29 ENCOUNTER — Telehealth: Payer: Self-pay | Admitting: Internal Medicine

## 2014-10-29 MED ORDER — TRIAMCINOLONE ACETONIDE 0.1 % EX CREA: 1.0000 "application " | TOPICAL_CREAM | Freq: Two times a day (BID) | CUTANEOUS | Status: DC

## 2014-10-29 MED ORDER — ATORVASTATIN CALCIUM 40 MG PO TABS: 40.0000 mg | ORAL_TABLET | Freq: Every day | ORAL | Status: DC

## 2014-10-29 NOTE — Telephone Encounter (Signed)
Pt called stated Dr. Jenny Reichmann was suppose to call in steroid cream to pharmacy (its not there) and he also mention that Dr. Jenny Reichmann change Crestor to Lipitor (it is not at the pharmacy) please check and send in rx into [facility].

## 2014-10-29 NOTE — Telephone Encounter (Signed)
Both Done erx 

## 2014-12-03 ENCOUNTER — Encounter: Payer: Self-pay | Admitting: Cardiology

## 2014-12-03 DIAGNOSIS — E668 Other obesity: Secondary | ICD-10-CM | POA: Diagnosis not present

## 2014-12-03 DIAGNOSIS — E785 Hyperlipidemia, unspecified: Secondary | ICD-10-CM | POA: Diagnosis not present

## 2014-12-03 DIAGNOSIS — I714 Abdominal aortic aneurysm, without rupture: Secondary | ICD-10-CM | POA: Diagnosis not present

## 2014-12-03 DIAGNOSIS — I6529 Occlusion and stenosis of unspecified carotid artery: Secondary | ICD-10-CM | POA: Diagnosis not present

## 2014-12-03 DIAGNOSIS — I119 Hypertensive heart disease without heart failure: Secondary | ICD-10-CM | POA: Diagnosis not present

## 2014-12-03 DIAGNOSIS — E119 Type 2 diabetes mellitus without complications: Secondary | ICD-10-CM | POA: Diagnosis not present

## 2014-12-03 DIAGNOSIS — I429 Cardiomyopathy, unspecified: Secondary | ICD-10-CM | POA: Diagnosis not present

## 2014-12-03 NOTE — Progress Notes (Unsigned)
Patient ID: [name], male   DOB: 02-Jan-1935, 80 y.o.   MRN: [id number]   Joshua, [name]    Date of visit:  12/03/2014 DOB:  03/03/1934    Age:  80 yrs. Medical record number:  [id number]     Account number:  [id number] Primary Care Provider: Biagio Borg ____________________________ CURRENT DIAGNOSES  1. Hypertensive heart disease without heart failure  2. Cardiomyopathy, unspecified  3. Carotid artery disease-Bilateral  4. Abdominal aortic aneurysm, without rupture  5. Obesity  6. Hyperlipidemia  7. Type 2 diabetes mellitus without complications ____________________________ ALLERGIES  No Known Allergies ____________________________ MEDICATIONS  1. fluticasone 50 mcg/actuation Disk with Device, PRN  2. multivitamin tablet, 1 p.o. daily  3. carvedilol 12.5 mg tablet, 1/2 tab b.i.d.  4. Ocuvite 150-30-6-150 mg-unit-mg-mg capsule, 1 p.o. daily  5. Glucosamine 500 mg tablet, 1 p.o. daily  6. metformin 1,000 mg tablet, BID  7. aspirin 81 mg chewable tablet, 1 p.o. daily  8. atorvastatin 20 mg tablet, 1 p.o. daily ____________________________ CHIEF COMPLAINTS  Followup of Hypertensive heart disease without heart failure ____________________________ HISTORY OF PRESENT ILLNESS Patient seen for cardiac followup. He has been doing well since he was previously here. He is exercising on a regular basis and his lipids were reviewed today and show cholesterol to be above goal. He denies angina and has no PND, orthopnea, syncope, palpitations, or claudication. She presumed was checked in June of this year and shows signs to be stable. He has a known occlusion of his right carotid artery with a 40% stenosis on the left. Has gained weight since he was previously here. ____________________________ PAST HISTORY  Past Medical Illnesses:  hypertension, hyperlipidemia, carotid artery stenosis-right, obesity, nephrolithiasis, BPH;  Cardiovascular Illnesses:  Carotid artery disease, abdominal  aneurysm;  Surgical Procedures:  knee replacement-bil, cataract extraction;  NYHA Classification:  I;  Canadian Angina Classification:  Class 0: Asymptomatic;  Cardiology Procedures-Invasive:  no history of prior cardiac procedures;  Cardiology Procedures-Noninvasive:  treadmill cardiolite June 2008, echocardiogram August 2009, echocardiogram September 2011, echocardiogram November 2014;  LVEF of 45% documented via echocardiogram on 12/12/2012,   ____________________________ CARDIO-PULMONARY TEST DATES EKG Date:  12/03/2014;  Nuclear Study Date:  07/24/2006;  Echocardiography Date: 12/12/2012;  Chest Xray Date: 08/23/2006;   ____________________________ FAMILY HISTORY Father -- Father dead, Motor neurone disease, Deceased Mother -- Coronary Artery Disease, Renal disorder, Deceased ____________________________ SOCIAL HISTORY Alcohol Use:  recovering alcoholic x 86+ years;  Smoking:  used to smoke but quit 1983;  Diet:  carbohydrate modified diet;  Lifestyle:  divorced and remarried;  Exercise:  exercise is limited due to physical disability;  Occupation:  retired and Barrister's clerk;  Residence:  lives with wife;   ____________________________ REVIEW OF SYSTEMS General:  obesity, weight gain of approximately 5 lbs Eyes: wears eye glasses/contact lenses, cataract extraction right eye, vision loss OD Ears, Nose, Throat, Mouth:  hearing aides Respiratory: denies dyspnea, cough, wheezing or hemoptysis. Cardiovascular:  please review HPI Abdominal: denies dyspepsia, GI bleeding, constipation, or diarrhea Genitourinary-Male: nocturia  Musculoskeletal:  arthritis of the right knee  ____________________________ PHYSICAL EXAMINATION VITAL SIGNS  Blood Pressure:  124/70 Sitting, Right arm, large cuff  , 128/78 Standing, Right arm and large cuff   Pulse:  68/min. Weight:  268.00 lbs. Height:  71"BMI: 37  Constitutional:  pleasant white male in no acute distress, moderately obese Skin:   occasional hemangiomas, maculopapular lesions feet Head:  normocephalic, normal hair pattern, no masses or tenderness ENT:  hearing  aide present left ear, ears, nose and throat unremarkable Neck:  supple, no masses, thyromegaly, JVD. Carotid pulses are full and equal bilaterally without bruits. Chest:  normal symmetry, clear to auscultation and percussion. Cardiac:  regular rhythm, normal S1 and S2, No S3 or S4, no murmurs, gallops or rubs detected. Peripheral Pulses:  the femoral,dorsalis pedis, and posterior tibial pulses are full and equal bilaterally with no bruits auscultated. Extremities & Back:  no edema present, bilateral lower extremity venous varicosities, Healed scar from knee surgery Neurological:  no gross motor or sensory deficits noted, affect appropriate, oriented x3. ____________________________ MOST RECENT LIPID PANEL 10/22/14  CHOL TOTL 208 mg/dl, LDL 147 NM, HDL 44 mg/dl and TRIGLYCER 221 mg/dl ____________________________ IMPRESSIONS/PLAN  1. Hypertensive heart disease blood pressure currently controlled 2. Atherosclerotic vascular disease 3. Small abdominal aneurysm 4. Obesity with need to lose weight 5. Hyperlipidemia currently above goal  Recommendations:  He should lose weight and his lipids are currently above goal. Blood pressure currently reasonable control. I would increase his atorvastatin 40 mg daily. I will see him in followup in one year. Call if problems. EKG shows sinus with PVC's and nonsecific ST and T wave changes.  ____________________________ TODAYS ORDERS  1. 12 Lead EKG: Today  2. Return Visit: 1 year                       ____________________________ Cardiology Physician:  Kerry Hough MD [facility]

## 2014-12-22 ENCOUNTER — Other Ambulatory Visit: Payer: Self-pay | Admitting: Internal Medicine

## 2014-12-23 ENCOUNTER — Other Ambulatory Visit: Payer: Self-pay | Admitting: Internal Medicine

## 2014-12-24 ENCOUNTER — Telehealth: Payer: Self-pay | Admitting: Internal Medicine

## 2014-12-24 DIAGNOSIS — H34211 Partial retinal artery occlusion, right eye: Secondary | ICD-10-CM | POA: Diagnosis not present

## 2014-12-24 MED ORDER — METFORMIN HCL 1000 MG PO TABS: 1000.0000 mg | ORAL_TABLET | Freq: Two times a day (BID) | ORAL | Status: DC

## 2014-12-24 NOTE — Telephone Encounter (Signed)
Pt called stated Metformin need to be send to [facility] on [address] because [facility] is out of it. Please help, pt is out of this med today.

## 2015-02-17 ENCOUNTER — Other Ambulatory Visit: Payer: Self-pay | Admitting: Internal Medicine

## 2015-02-23 ENCOUNTER — Telehealth: Payer: Self-pay

## 2015-02-23 NOTE — Telephone Encounter (Signed)
[name] called back and scheduled AWV with CPEon march 17 which was his preference Scheduled for AWV 3/17 at 2pm and will see Dr. Jenny Reichmann at 2:45pm

## 2015-02-25 DIAGNOSIS — H903 Sensorineural hearing loss, bilateral: Secondary | ICD-10-CM | POA: Diagnosis not present

## 2015-03-11 DIAGNOSIS — 419620001 Death: Secondary | SNOMED CT | POA: Diagnosis not present

## 2015-03-11 DEATH — deceased

## 2015-04-10 DIAGNOSIS — L570 Actinic keratosis: Secondary | ICD-10-CM | POA: Diagnosis not present

## 2015-04-10 DIAGNOSIS — L578 Other skin changes due to chronic exposure to nonionizing radiation: Secondary | ICD-10-CM | POA: Diagnosis not present

## 2015-04-10 DIAGNOSIS — B353 Tinea pedis: Secondary | ICD-10-CM | POA: Diagnosis not present

## 2015-04-22 ENCOUNTER — Other Ambulatory Visit (INDEPENDENT_AMBULATORY_CARE_PROVIDER_SITE_OTHER): Payer: Medicare HMO

## 2015-04-22 DIAGNOSIS — E119 Type 2 diabetes mellitus without complications: Secondary | ICD-10-CM

## 2015-04-22 LAB — BASIC METABOLIC PANEL
BUN: 19 mg/dL (ref 6–23)
CO2: 27 mEq/L (ref 19–32)
Calcium: 9.4 mg/dL (ref 8.4–10.5)
Chloride: 102 mEq/L (ref 96–112)
Creatinine, Ser: 1.02 mg/dL (ref 0.40–1.50)
GFR: 75.02 mL/min (ref >60.00)
Glucose, Bld: 154 mg/dL — ABNORMAL HIGH (ref 70–99)
POTASSIUM: 3.9 meq/L (ref 3.5–5.1)
Sodium: 141 mEq/L (ref 135–145)

## 2015-04-22 LAB — LIPID PANEL
Cholesterol: 133 mg/dL (ref 0–200)
HDL: 39.3 mg/dL (ref >39.00)
LDL Cholesterol: 63 mg/dL (ref 0–99)
NONHDL: 93.92
Total CHOL/HDL Ratio: 3
Triglycerides: 156 mg/dL — ABNORMAL HIGH (ref 0.0–149.0)
VLDL: 31.2 mg/dL (ref 0.0–40.0)

## 2015-04-22 LAB — HEPATIC FUNCTION PANEL
ALK PHOS: 47 U/L (ref 39–117)
ALT: 21 U/L (ref 0–53)
AST: 16 U/L (ref 0–37)
Albumin: 4.3 g/dL (ref 3.5–5.2)
BILIRUBIN DIRECT: 0.2 mg/dL (ref 0.0–0.3)
BILIRUBIN TOTAL: 0.8 mg/dL (ref 0.2–1.2)
TOTAL PROTEIN: 7.3 g/dL (ref 6.0–8.3)

## 2015-04-22 LAB — HEMOGLOBIN A1C: HEMOGLOBIN A1C: 7.2 % — AB (ref 4.6–6.5)

## 2015-04-24 ENCOUNTER — Ambulatory Visit: Payer: Medicare HMO | Admitting: Internal Medicine

## 2015-04-27 ENCOUNTER — Other Ambulatory Visit: Payer: Self-pay | Admitting: Internal Medicine

## 2015-04-29 ENCOUNTER — Ambulatory Visit (INDEPENDENT_AMBULATORY_CARE_PROVIDER_SITE_OTHER): Payer: Medicare HMO | Admitting: Internal Medicine

## 2015-04-29 ENCOUNTER — Encounter: Payer: Self-pay | Admitting: Internal Medicine

## 2015-04-29 VITALS — BP 130/70 | HR 55 | Temp 98.0°F | Ht 71.0 in | Wt 269.0 lb

## 2015-04-29 DIAGNOSIS — E785 Hyperlipidemia, unspecified: Secondary | ICD-10-CM | POA: Diagnosis not present

## 2015-04-29 DIAGNOSIS — R21 Rash and other nonspecific skin eruption: Secondary | ICD-10-CM | POA: Diagnosis not present

## 2015-04-29 DIAGNOSIS — Z0189 Encounter for other specified special examinations: Secondary | ICD-10-CM | POA: Diagnosis not present

## 2015-04-29 DIAGNOSIS — Z0000 Encounter for general adult medical examination without abnormal findings: Secondary | ICD-10-CM

## 2015-04-29 DIAGNOSIS — E119 Type 2 diabetes mellitus without complications: Secondary | ICD-10-CM | POA: Diagnosis not present

## 2015-04-29 DIAGNOSIS — I119 Hypertensive heart disease without heart failure: Secondary | ICD-10-CM | POA: Diagnosis not present

## 2015-04-29 NOTE — Progress Notes (Signed)
Pre visit review using our clinic review tool, if applicable. No additional management support is needed unless otherwise documented below in the visit note. 

## 2015-04-29 NOTE — Progress Notes (Signed)
Subjective:   [name] is a 81 y.o. male who presents for Medicare Annual/Subsequent preventive examination.  Review of Systems:  HRA assessment completed during visit; Lanell Persons   The Patient was informed that this wellness visit is to identify risk and educate on how to reduce risk for increase disease through lifestyle changes.   ROS deferred to CPE exam with physician  Medical and family hx Mother HD; COPD; Kidney disease; HTN; Asthma; Depression Father ALS, DM   Medical issues  DM2 04/2015; A1c 7.2 up from 6.9 in Sept Checks BS; in am 145 to 155;  Hyperlipidemia 04/2015; Cho 133; Trig 156; HDL 39; LDL 50 HTn medically controlled  AAA Stroke; embolism right eye   Former smoker; [place] ETOH: none  BMI: 37 and discussed losing weight in the past and benefits of losing weight In the past, lost weight on [employer]; 48lbs: low fat  Current Diet  Eat biscuit for breakfast; sandwich Evening meal; meat and couple of vegetables;  Drink milk; 2% Wife has a problem gaining weight; so they are no opposite ends of the spectrum;    Exercise; plays golf; suntan lotion; Plays x 2 week;  Retirement comm has exercise classes; friends go exercise; He can join them  SAFETY; lives with spouse / Lives at [facility]; x 3 years  Moving well; no loss of function S/p both knees replaced; but still some OA in knees and hip Falls; no falls; does not  climb ladders Safety reviewed for the home;  Removal of clutter clearing paths through the home,  Railing as needed; yes Bathroom safety; good for now [employer] safety; yes Smoke detectors yes Firearms safety /no firearms Driving accidents and seatbelt/ yes Sun protection/ maybe in the summer and when playing golf   Medication review/ New meds/ no issues  Fall assessment  No    Mobilization and Functional losses in the last year./ no   Counseling: Ua Albumin completed  Colonoscopy; 02/2014; does not  need to repeat EKG: 08/2009 Hearing: has new hearing aids; are working well   Ophthalmology exam; has eye exam bi annually; had miniature stroke in right eye Lost 40 to 50% vision in right eye; doesn't bother driving   Immunizations: Zostavax; Educated and will consider;   Advanced Directive; yes Health advice or referrals  Current Care Team reviewed and updated   Cardiac Risk Factors include: advanced age (>18men, >16 women);diabetes mellitus;dyslipidemia;family history of premature cardiovascular disease;hypertension;male gender;obesity (BMI >30kg/m2)     Objective:    Vitals: BP 130/70 mmHg  Temp(Src) 98 F (36.7 C) (Oral)  Ht 5\' 11"  (1.803 m)  Wt 269 lb (122.018 kg)  BMI 37.53 kg/m2  Body mass index is 37.53 kg/(m^2).  Tobacco History  Smoking status  . Former Smoker -- 0.75 packs/day for 25 years  . Types: Cigarettes  . Quit date: 07/25/1982  Smokeless tobacco  . Never Used     Counseling given: Yes   Past Medical History  Diagnosis Date  . Type 2 diabetes mellitus ([place])   . History of kidney stones   . Diverticulosis of colon   . Allergic rhinitis   . BPH (benign prostatic hypertrophy)   . Hyperlipidemia   . Phimosis   . Hypertension   . Peripheral vascular disease ([place])   . AAA (abdominal aortic aneurysm) ([place])     MONITORED BY DR LAWSON LOV JUNE 2014--  STABLE  AT 3.7  . Asymptomatic stenosis of left carotid artery  without infarction     MILD DISEASE  . Right carotid artery occlusion     CHRONIC   WITHOUT INFARTION  . Wears hearing aid   . History of cardiomyopathy     IDIOPATHIC-- PER TILLEY NOTE 2013 , CURRENTLY RESOLVED  . Stroke [facility])      Right eye  . Arthritis    Past Surgical History  Procedure Laterality Date  . Total knee arthroplasty Bilateral RIGHT 10-08-2007;   LEFT 09-30-2008  . Cysto/  right ureteral stent placement  10-16-2003  . Extracorporeal shock wave lithotripsy Right 2005  . Transthoracic echocardiogram  10-21-2009     LVEF 50%  . Cardiovascular stress test  JUNE 2008  DR Wynonia Lawman  . Cataract extraction w/ intraocular lens implant Right   . Circumcision N/A 08/30/2012    Procedure: CIRCUMCISION ADULT;  Surgeon: Malka So, MD;  Location: [facility];  Service: Urology;  Laterality: N/A;  . Eye surgery    . Joint replacement Bilateral 2008  2009    Knee   Family History  Problem Relation Age of Onset  . Heart disease Mother     CHF  Before age 23  . COPD Mother   . Kidney disease Mother   . Hypertension Mother   . Asthma Mother   . Depression Mother   . ALS Father   . Diabetes Father   . Colon cancer Neg Hx    History  Sexual Activity  . Sexual Activity: Not on file    Outpatient Encounter Prescriptions as of 04/29/2015  Medication Sig  . aspirin 81 MG tablet Take 81 mg by mouth daily.   Marland Kitchen atorvastatin (LIPITOR) 40 MG tablet Take 1 tablet (40 mg total) by mouth daily.  . carvedilol (COREG) 12.5 MG tablet TAKE 1/2 TABLET TWICE DAILY WITH A MEAL  . GLIPIZIDE XL 2.5 MG 24 hr tablet TAKE ONE (1) TABLET EACH DAY WITH BREAKFAST  . Glucosamine-Chondroit-Vit C-Mn (GLUCOSAMINE 1500 COMPLEX PO) Take 2 tablets by mouth daily.   . hydrochlorothiazide (HYDRODIURIL) 25 MG tablet TAKE ONE (1) TABLET EACH DAY  . metFORMIN (GLUCOPHAGE) 1000 MG tablet Take 1 tablet (1,000 mg total) by mouth 2 (two) times daily.  . Multiple Vitamin (MULTIVITAMIN) capsule Take 1 capsule by mouth daily.    Marland Kitchen triamcinolone cream (KENALOG) 0.1 % Apply 1 application topically 2 (two) times daily.   No facility-administered encounter medications on file as of 04/29/2015.    Activities of Daily Living In your present state of health, do you have any difficulty performing the following activities: 04/29/2015  Hearing? Y  Vision? Y  Difficulty concentrating or making decisions? N  Walking or climbing stairs? N  Dressing or bathing? N  Doing errands, shopping? N  Preparing Food and eating ? N  Using the Toilet? N    In the past six months, have you accidently leaked urine? N  Do you have problems with loss of bowel control? N  Managing your Medications? N  Managing your Finances? N  Housekeeping or managing your Housekeeping? N    Patient Care Team: Biagio Borg, MD as PCP - General Jacolyn Reedy, MD as Consulting Physician (Cardiology)   Assessment:     Exercise Activities and Dietary recommendations Current Exercise Habits: Home exercise routine, Type of exercise: walking, Time (Minutes): 60 (will start walking 4 days a week; no fail), Intensity: Moderate  Goals    . Weight < 230 lb (104.327 kg)  Develop the motivation Had better health and more energy;  Motivation is 6 from 1-10;   Would try the [employer] Controllable  risk for heart disease reviewed for goal setting: Includes; family hx; HTN; decreased renal function; obesity; sedentary lifestyle Educated regarding Metabolic syndrome / Factors that increase risk for Heart Disease: Heart Healthy Diet; less sugar  Mediterranean Diet: eating primarily plant-based food such as fruits and vegetables, whole grains, legumes and nuts; replacing butter with healthy fats such as olive oil and canola oil Using herbs and spices instead of salt to flavor food Limiting red meat to no more than a few times a month Eating fish and poultry at least 2 times a week Getting plenty of exercise  Excess body fat around the waist Waist circumference for Men >40  Triglycerides > 150 HDL < 50 BP > 130/85 Glucose > 100 Goals are determined by the physician and based on other relevant data and risk Plan Lifestyle changes          Fall Risk Fall Risk  04/29/2015 10/24/2014 10/23/2013 04/19/2013  Falls in the past year? No No No No   Depression Screen PHQ 2/9 Scores 04/29/2015 10/24/2014 10/23/2013 04/19/2013  PHQ - 2 Score 0 0 0 0    Cognitive Testing No flowsheet data found.  Ad8 score 0 / no issues noted or c/o about at today's  assessment  Immunization History  Administered Date(s) Administered  . Influenza Whole 12/08/2008  . Influenza,inj,Quad PF,36+ Mos 10/23/2013, 10/24/2014  . Pneumococcal Conjugate-13 04/19/2013  . Pneumococcal Polysaccharide-23 08/22/2008  . Td 02/07/1993, 08/22/2008   Screening Tests Health Maintenance  Topic Date Due  . ZOSTAVAX  01/17/1997  . URINE MICROALBUMIN  09/10/2013  . OPHTHALMOLOGY EXAM  05/09/2015  . INFLUENZA VACCINE  09/08/2015  . HEMOGLOBIN A1C  10/23/2015  . FOOT EXAM  10/24/2015  . TETANUS/TDAP  08/23/2018  . PNA vac Low Risk Adult  Completed      Plan:     Will consider weight loss; States he knows the benefits of more energy;  Also will consider going to walk with friends at retirement every day which will assist with management of Diabetes as well as well.   During the course of the visit the patient was educated and counseled about the following appropriate screening and preventive services:   Vaccines to include Pneumoccal, Influenza, Hepatitis B, Td, Zostavax, HCV  Will consider zoster  Electrocardiogram/ 08/2009/ no c/o of issues  Cardiovascular Disease/ BP good; BMI elevated and educated regarding risk  Colorectal cancer screening/ aged out;   Diabetes screening/ ongoing; slightly elevated; will make a plan to exercise more and consider weight loss  Prostate Cancer Screening/ deferred  Glaucoma screening/ has eye exam bi-annually  Nutrition counseling / educated and agreed to consider weight loss Smoking cessation counseling/ quit 1984; no ETOH   Patient Instructions (the written plan) was given to the patient.   To lose weight and exercise!   Wynetta Fines, RN  04/29/2015  Medical screening examination/treatment/procedure(s) were performed by non-physician practitioner and as supervising physician I was immediately available for consultation/collaboration. I agree with above. Cathlean Cower, MD

## 2015-04-29 NOTE — Patient Instructions (Addendum)
Please continue all other medications as before, and refills have been done if requested.  Please have the pharmacy call with any other refills you may need.  Please continue your efforts at being more active, low cholesterol diet, and weight control.  You are otherwise up to date with prevention measures today.  Please keep your appointments with your specialists as you may have planned  Please return in 6 months, or sooner if needed, with Lab testing done 3-5 days before   [name] , Thank you for taking time to come for your Medicare Wellness Visit. I appreciate your ongoing commitment to your health goals. Please review the following plan we discussed and let me know if I can assist you in the future.   These are the goals we discussed: Goals    . Weight < 230 lb (104.327 kg)     Develop the motivation Had better health and more energy;  Motivation is 6 from 1-10;   Would try the [employer] Controllable  risk for heart disease reviewed for goal setting: Includes; family hx; HTN; decreased renal function; obesity; sedentary lifestyle Educated regarding Metabolic syndrome / Factors that increase risk for Heart Disease: Heart Healthy Diet; less sugar  Mediterranean Diet: eating primarily plant-based food such as fruits and vegetables, whole grains, legumes and nuts; replacing butter with healthy fats such as olive oil and canola oil Using herbs and spices instead of salt to flavor food Limiting red meat to no more than a few times a month Eating fish and poultry at least 2 times a week Getting plenty of exercise  Excess body fat around the waist Waist circumference for Men >40  Triglycerides > 150 HDL < 50 BP > 130/85 Glucose > 100 Goals are determined by the physician and based on other relevant data and risk Plan Lifestyle changes           This is a list of the screening recommended for you and due dates:  Health Maintenance  Topic Date Due  .  Shingles Vaccine  01/17/1997  . Urine Protein Check  09/10/2013  . Eye exam for diabetics  05/09/2015  . Flu Shot  09/08/2015  . Hemoglobin A1C  10/23/2015  . Complete foot exam   10/24/2015  . Tetanus Vaccine  08/23/2018  . Pneumonia vaccines  Completed

## 2015-04-29 NOTE — Progress Notes (Signed)
Subjective:    Patient ID: [name], male    DOB: [birth year], 81 y.o.   MRN: [id number]  HPI  Here to f/u; overall doing ok,  Pt denies chest pain, increasing sob or doe, wheezing, orthopnea, PND, increased LE swelling, palpitations, dizziness or syncope.  Pt denies new neurological symptoms such as new headache, or facial or extremity weakness or numbness.  Pt denies polydipsia, polyuria, or low sugar episode.   Pt denies new neurological symptoms such as new headache, or facial or extremity weakness or numbness.   Pt states overall good compliance with meds, mostly trying to follow appropriate diet, with wt overall stable,  but little exercise however.  Hard to lose wt, but plans to cut back on starches. Has persistent rash to post rght achiles area, not better after derm tx with cream, but has f/u appt later this month.   Past Medical History  Diagnosis Date  . Type 2 diabetes mellitus ([place])   . History of kidney stones   . Diverticulosis of colon   . Allergic rhinitis   . BPH (benign prostatic hypertrophy)   . Hyperlipidemia   . Phimosis   . Hypertension   . Peripheral vascular disease ([place])   . AAA (abdominal aortic aneurysm) ([place])     MONITORED BY DR LAWSON LOV JUNE 2014--  STABLE  AT 3.7  . Asymptomatic stenosis of left carotid artery without infarction     MILD DISEASE  . Right carotid artery occlusion     CHRONIC   WITHOUT INFARTION  . Wears hearing aid   . History of cardiomyopathy     IDIOPATHIC-- PER [facility] NOTE 2013 , CURRENTLY RESOLVED  . Stroke [facility])      Right eye  . Arthritis    Past Surgical History  Procedure Laterality Date  . Total knee arthroplasty Bilateral RIGHT 10-08-2007;   LEFT 09-30-2008  . Cysto/  right ureteral stent placement  10-16-2003  . Extracorporeal shock wave lithotripsy Right 2005  . Transthoracic echocardiogram  10-21-2009    LVEF 50%  . Cardiovascular stress test  JUNE 2008  DR Wynonia Lawman  . Cataract extraction w/ intraocular lens  implant Right   . Circumcision N/A 08/30/2012    Procedure: CIRCUMCISION ADULT;  Surgeon: Malka So, MD;  Location: [facility];  Service: Urology;  Laterality: N/A;  . Eye surgery    . Joint replacement Bilateral 2008  2009    Knee    reports that he quit smoking about 32 years ago. His smoking use included Cigarettes. He has a 18.75 pack-year smoking history. He has never used smokeless tobacco. He reports that he does not drink alcohol or use illicit drugs. family history includes ALS in his father; Asthma in his mother; COPD in his mother; Depression in his mother; Diabetes in his father; Heart disease in his mother; Hypertension in his mother; Kidney disease in his mother. There is no history of Colon cancer. No Known Allergies Current Outpatient Prescriptions on File Prior to Visit  Medication Sig Dispense Refill  . aspirin 81 MG tablet Take 81 mg by mouth daily.     Marland Kitchen atorvastatin (LIPITOR) 40 MG tablet Take 1 tablet (40 mg total) by mouth daily. 90 tablet 3  . carvedilol (COREG) 12.5 MG tablet TAKE 1/2 TABLET TWICE DAILY WITH A MEAL 90 tablet 1  . GLIPIZIDE XL 2.5 MG 24 hr tablet TAKE ONE (1) TABLET EACH DAY WITH BREAKFAST 90 tablet 1  . Glucosamine-Chondroit-Vit  C-Mn (GLUCOSAMINE 1500 COMPLEX PO) Take 2 tablets by mouth daily.     . hydrochlorothiazide (HYDRODIURIL) 25 MG tablet TAKE ONE (1) TABLET EACH DAY 90 tablet 1  . metFORMIN (GLUCOPHAGE) 1000 MG tablet Take 1 tablet (1,000 mg total) by mouth 2 (two) times daily. 180 tablet 3  . Multiple Vitamin (MULTIVITAMIN) capsule Take 1 capsule by mouth daily.      Marland Kitchen triamcinolone cream (KENALOG) 0.1 % Apply 1 application topically 2 (two) times daily. 30 g 0   No current facility-administered medications on file prior to visit.   Review of Systems Constitutional: Negative for increased diaphoresis, other activity, appetite or siginficant weight change other than noted HENT: Negative for worsening hearing loss, ear pain,  facial swelling, mouth sores and neck stiffness.   Eyes: Negative for other worsening pain, redness or visual disturbance.  Respiratory: Negative for shortness of breath and wheezing  Cardiovascular: Negative for chest pain and palpitations.  Gastrointestinal: Negative for diarrhea, blood in stool, abdominal distention or other pain Genitourinary: Negative for hematuria, flank pain or change in urine volume.  Musculoskeletal: Negative for myalgias or other joint complaints.  Skin: Negative for color change and wound or drainage.  Neurological: Negative for syncope and numbness. other than noted Hematological: Negative for adenopathy. or other swelling Psychiatric/Behavioral: Negative for hallucinations, SI, self-injury, decreased concentration or other worsening agitation.      Objective:   Physical Exam BP 130/70 mmHg  Pulse 55  Temp(Src) 98 F (36.7 C) (Oral)  Ht 5\' 11"  (1.803 m)  Wt 269 lb (122.018 kg)  BMI 37.53 kg/m2  SpO2 95% VS noted,  Constitutional: Pt is oriented to person, place, and time. Appears well-developed and well-nourished, in no significant distress Head: Normocephalic and atraumatic.  Right Ear: External ear normal.  Left Ear: External ear normal.  Nose: Nose normal.  Mouth/Throat: Oropharynx is clear and moist.  Eyes: Conjunctivae and EOM are normal. Pupils are equal, round, and reactive to light.  Neck: Normal range of motion. Neck supple. No JVD present. No tracheal deviation present or significant neck LA or mass Cardiovascular: Normal rate, regular rhythm, normal heart sounds and intact distal pulses.   Pulmonary/Chest: Effort normal and breath sounds without rales or wheezing  Abdominal: Soft. Bowel sounds are normal. NT. No HSM  Musculoskeletal: Normal range of motion. Exhibits no edema.  Lymphadenopathy:  Has no cervical adenopathy.  Neurological: Pt is alert and oriented to person, place, and time. Pt has normal reflexes. No cranial nerve deficit.  Motor grossly intact Skin: Skin is warm and dry. Post right distal leg with 4 cm area nontender slightly raised area of erythema rash noted.  Psychiatric:  Has normal mood and affect. Behavior is normal.     Assessment & Plan:

## 2015-05-01 DIAGNOSIS — R21 Rash and other nonspecific skin eruption: Secondary | ICD-10-CM | POA: Insufficient documentation

## 2015-05-01 HISTORY — DX: Rash and other nonspecific skin eruption: R21

## 2015-05-01 NOTE — Assessment & Plan Note (Signed)
Lab Results  Component Value Date   LDLCALC 63 04/22/2015   stable overall by history and exam, recent data reviewed with pt, and pt to continue medical treatment as before,  to f/u any worsening symptoms or concerns, for f/u lab

## 2015-05-01 NOTE — Assessment & Plan Note (Signed)
Lab Results  Component Value Date   HGBA1C 7.2* 04/22/2015   stable overall by history and exam, recent data reviewed with pt, and pt to continue medical treatment as before,  to f/u any worsening symptoms or concerns, cont diet and wt control efforts

## 2015-05-01 NOTE — Assessment & Plan Note (Signed)
Mild, nontender, afeb, c/w contact vs other dermatitis, for triam cr prn, consider derm referral,  to f/u any worsening symptoms or concerns

## 2015-05-01 NOTE — Assessment & Plan Note (Signed)

## 2015-05-01 NOTE — Assessment & Plan Note (Signed)
stable overall by history and exam, recent data reviewed with pt, and pt to continue medical treatment as before,  to f/u any worsening symptoms or concerns BP Readings from Last 3 Encounters:  04/29/15 130/70  10/24/14 110/60  07/29/14 136/85

## 2015-05-15 DIAGNOSIS — B353 Tinea pedis: Secondary | ICD-10-CM | POA: Diagnosis not present

## 2015-05-15 DIAGNOSIS — L570 Actinic keratosis: Secondary | ICD-10-CM | POA: Diagnosis not present

## 2015-05-25 ENCOUNTER — Other Ambulatory Visit: Payer: Self-pay | Admitting: Internal Medicine

## 2015-06-22 ENCOUNTER — Other Ambulatory Visit: Payer: Self-pay | Admitting: Internal Medicine

## 2015-06-24 DIAGNOSIS — Z961 Presence of intraocular lens: Secondary | ICD-10-CM | POA: Diagnosis not present

## 2015-06-24 DIAGNOSIS — H353113 Nonexudative age-related macular degeneration, right eye, advanced atrophic without subfoveal involvement: Secondary | ICD-10-CM | POA: Diagnosis not present

## 2015-06-24 DIAGNOSIS — H34211 Partial retinal artery occlusion, right eye: Secondary | ICD-10-CM | POA: Diagnosis not present

## 2015-06-24 DIAGNOSIS — H353122 Nonexudative age-related macular degeneration, left eye, intermediate dry stage: Secondary | ICD-10-CM | POA: Diagnosis not present

## 2015-06-24 DIAGNOSIS — E119 Type 2 diabetes mellitus without complications: Secondary | ICD-10-CM | POA: Diagnosis not present

## 2015-06-24 DIAGNOSIS — H53411 Scotoma involving central area, right eye: Secondary | ICD-10-CM | POA: Diagnosis not present

## 2015-07-29 ENCOUNTER — Encounter: Payer: Self-pay | Admitting: Family

## 2015-08-03 DIAGNOSIS — E7800 Pure hypercholesterolemia, unspecified: Secondary | ICD-10-CM | POA: Diagnosis not present

## 2015-08-03 DIAGNOSIS — Z0000 Encounter for general adult medical examination without abnormal findings: Secondary | ICD-10-CM | POA: Diagnosis not present

## 2015-08-03 DIAGNOSIS — E1151 Type 2 diabetes mellitus with diabetic peripheral angiopathy without gangrene: Secondary | ICD-10-CM | POA: Diagnosis not present

## 2015-08-03 DIAGNOSIS — I6522 Occlusion and stenosis of left carotid artery: Secondary | ICD-10-CM | POA: Diagnosis not present

## 2015-08-03 DIAGNOSIS — I10 Essential (primary) hypertension: Secondary | ICD-10-CM | POA: Diagnosis not present

## 2015-08-05 ENCOUNTER — Ambulatory Visit (HOSPITAL_COMMUNITY)
Admission: RE | Admit: 2015-08-05 | Discharge: 2015-08-05 | Disposition: A | Discharge status: Home / Self Care | Payer: Medicare HMO | Source: Ambulatory Visit | Attending: Family | Admitting: Family

## 2015-08-05 ENCOUNTER — Ambulatory Visit (INDEPENDENT_AMBULATORY_CARE_PROVIDER_SITE_OTHER): Payer: Medicare HMO | Admitting: Family

## 2015-08-05 ENCOUNTER — Encounter: Payer: Self-pay | Admitting: Family

## 2015-08-05 ENCOUNTER — Ambulatory Visit (INDEPENDENT_AMBULATORY_CARE_PROVIDER_SITE_OTHER)
Admission: RE | Admit: 2015-08-05 | Discharge: 2015-08-05 | Disposition: A | Discharge status: Home / Self Care | Payer: Medicare HMO | Source: Ambulatory Visit | Attending: Family | Admitting: Family

## 2015-08-05 VITALS — BP 118/69 | HR 49 | Temp 97.6°F | Resp 16 | Ht 71.0 in | Wt 268.0 lb

## 2015-08-05 DIAGNOSIS — I10 Essential (primary) hypertension: Secondary | ICD-10-CM | POA: Insufficient documentation

## 2015-08-05 DIAGNOSIS — I6521 Occlusion and stenosis of right carotid artery: Secondary | ICD-10-CM

## 2015-08-05 DIAGNOSIS — I6522 Occlusion and stenosis of left carotid artery: Secondary | ICD-10-CM | POA: Diagnosis not present

## 2015-08-05 DIAGNOSIS — E785 Hyperlipidemia, unspecified: Secondary | ICD-10-CM | POA: Insufficient documentation

## 2015-08-05 DIAGNOSIS — Z87891 Personal history of nicotine dependence: Secondary | ICD-10-CM

## 2015-08-05 DIAGNOSIS — E119 Type 2 diabetes mellitus without complications: Secondary | ICD-10-CM | POA: Insufficient documentation

## 2015-08-05 DIAGNOSIS — I714 Abdominal aortic aneurysm, without rupture, unspecified: Secondary | ICD-10-CM

## 2015-08-05 DIAGNOSIS — Z48812 Encounter for surgical aftercare following surgery on the circulatory system: Secondary | ICD-10-CM | POA: Insufficient documentation

## 2015-08-05 DIAGNOSIS — I6523 Occlusion and stenosis of bilateral carotid arteries: Secondary | ICD-10-CM | POA: Diagnosis not present

## 2015-08-05 LAB — VAS US CAROTID
LCCADSYS: 89 cm/s
LCCAPDIAS: 31 cm/s
LEFT ECA DIAS: -18 cm/s
LEFT VERTEBRAL DIAS: 12 cm/s
LICADSYS: -80 cm/s
Left CCA dist dias: 29 cm/s
Left CCA prox sys: 126 cm/s
Left ICA dist dias: -26 cm/s
Left ICA prox dias: -38 cm/s
Left ICA prox sys: -102 cm/s
RCCAPDIAS: 16 cm/s
RIGHT CCA MID DIAS: 11 cm/s
RIGHT ECA DIAS: -18 cm/s
RIGHT VERTEBRAL DIAS: 11 cm/s
Right CCA prox sys: 86 cm/s

## 2015-08-05 NOTE — Progress Notes (Signed)
[facility] [place] HISTORY AND PHYSICAL   MRN : [id number]  History of Present Illness:   [name] is a 81 y.o. male patient of Dr. Kellie Simmering whom we are following for carotid artery occlusive disease and a small aortic aneurysm. He has a known occlusion of his right internal carotid artery, we are monitoring the right ICA. He returns today for one year follow up. 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 He denies abdominal of back pain. He denies claudication symptoms in legs with walking, denies non healing wounds, denies any new medical problems or surgeries. He takes a daily ASA, daily statin, and a beta blocker, denies taking any anticoagulants, states he has cardiomyopathy, denies any history of MI.  Pt Diabetic: Yes, March 2017 A1C was 7.2 (review of records) Pt smoker: former smoker, quit about 1985  He is a recovering alcoholic.   Pt meds include: Statin :Yes Betablocker: Yes ASA: Yes Other anticoagulants/antiplatelets: no   Current Outpatient Prescriptions  Medication Sig Dispense Refill  . aspirin 81 MG tablet Take 81 mg by mouth daily.     Marland Kitchen atorvastatin (LIPITOR) 40 MG tablet Take 1 tablet (40 mg total) by mouth daily. 90 tablet 3  . carvedilol (COREG) 12.5 MG tablet TAKE 1/2 TABLET TWICE PER DAY WITH A MEAL 90 tablet 1  . GLIPIZIDE XL 2.5 MG 24 hr tablet TAKE ONE (1) TABLET EACH DAY WITH BREAKFAST 90 tablet 1  . Glucosamine-Chondroit-Vit C-Mn (GLUCOSAMINE 1500 COMPLEX PO) Take 2 tablets by mouth daily.     . hydrochlorothiazide (HYDRODIURIL) 25 MG tablet TAKE ONE (1) TABLET EACH DAY 90 tablet 0  . metFORMIN (GLUCOPHAGE) 1000 MG tablet Take 1 tablet (1,000 mg total)  by mouth 2 (two) times daily. 180 tablet 3  . Multiple Vitamin (MULTIVITAMIN) capsule Take 1 capsule by mouth daily.      . multivitamin-lutein (OCUVITE-LUTEIN) CAPS capsule Take 1 capsule by mouth daily.    Marland Kitchen triamcinolone cream (KENALOG) 0.1 % Apply 1 application topically 2 (two) times daily. 30 g 0   No current facility-administered medications for this visit.    Past Medical History  Diagnosis Date  . Type 2 diabetes mellitus ([place])   . History of kidney stones   . Diverticulosis of colon   . Allergic rhinitis   . BPH (benign prostatic hypertrophy)   . Hyperlipidemia   . Phimosis   . Hypertension   . Peripheral vascular disease ([place])   . AAA (abdominal aortic aneurysm) ([place])     MONITORED BY DR LAWSON LOV JUNE 2014--  STABLE  AT 3.7  . Asymptomatic stenosis of left carotid artery without infarction     MILD DISEASE  . Right carotid artery occlusion     CHRONIC   WITHOUT INFARTION  . Wears hearing aid   . History of cardiomyopathy     IDIOPATHIC-- PER [place] NOTE 2013 , CURRENTLY RESOLVED  . Stroke [facility])      Right eye  . Arthritis     Social History Social History  Substance Use Topics  . Smoking status: Former Smoker -- 0.75 packs/day for 25 years    Types: Cigarettes    Quit date: 07/25/1982  . Smokeless  tobacco: Never Used  . Alcohol Use: No     Comment: recovering alcoholic  +79yrs    Family History Family History  Problem Relation Age of Onset  . Heart disease Mother     CHF  Before age 68  . COPD Mother   . Kidney disease Mother   . Hypertension Mother   . Asthma Mother   . Depression Mother   . ALS Father   . Diabetes Father   . Colon cancer Neg Hx     Surgical History Past Surgical History  Procedure Laterality Date  . Total knee arthroplasty Bilateral RIGHT 10-08-2007;   LEFT 09-30-2008  . Cysto/  right ureteral stent placement  10-16-2003  . Extracorporeal shock wave lithotripsy Right 2005  . Transthoracic echocardiogram  10-21-2009     LVEF 50%  . Cardiovascular stress test  JUNE 2008  DR Donnie Aho  . Cataract extraction w/ intraocular lens implant Right   . Circumcision N/A 08/30/2012    Procedure: CIRCUMCISION ADULT;  Surgeon: Anner Crete, MD;  Location: [facility];  Service: Urology;  Laterality: N/A;  . Eye surgery    . Joint replacement Bilateral 2008  2009    Knee    No Known Allergies  Current Outpatient Prescriptions  Medication Sig Dispense Refill  . aspirin 81 MG tablet Take 81 mg by mouth daily.     Marland Kitchen atorvastatin (LIPITOR) 40 MG tablet Take 1 tablet (40 mg total) by mouth daily. 90 tablet 3  . carvedilol (COREG) 12.5 MG tablet TAKE 1/2 TABLET TWICE PER DAY WITH A MEAL 90 tablet 1  . GLIPIZIDE XL 2.5 MG 24 hr tablet TAKE ONE (1) TABLET EACH DAY WITH BREAKFAST 90 tablet 1  . Glucosamine-Chondroit-Vit C-Mn (GLUCOSAMINE 1500 COMPLEX PO) Take 2 tablets by mouth daily.     . hydrochlorothiazide (HYDRODIURIL) 25 MG tablet TAKE ONE (1) TABLET EACH DAY 90 tablet 0  . metFORMIN (GLUCOPHAGE) 1000 MG tablet Take 1 tablet (1,000 mg total) by mouth 2 (two) times daily. 180 tablet 3  . Multiple Vitamin (MULTIVITAMIN) capsule Take 1 capsule by mouth daily.      . multivitamin-lutein (OCUVITE-LUTEIN) CAPS capsule Take 1 capsule by mouth daily.    Marland Kitchen triamcinolone cream (KENALOG) 0.1 % Apply 1 application topically 2 (two) times daily. 30 g 0   No current facility-administered medications for this visit.     REVIEW OF SYSTEMS: See HPI for pertinent positives and negatives.  Physical Examination Filed Vitals:   08/05/15 0944 08/05/15 0946  BP: 129/76 118/69  Pulse: 52 49  Temp: 97.6 F (36.4 C)   Resp: 16   Height: 5\' 11"  (1.803 m)   Weight: 268 lb (121.564 kg)   SpO2: 97%    Body mass index is 37.39 kg/(m^2).  General: WDWN obese male in NAD Gait: Normal HENT: WNL Eyes: Pupils equal Pulmonary: normal non-labored breathing, no rales, rhonchi, or wheezing Cardiac: RRR, no murmur  detected  Abdomen: soft, NT, reducible asymptomatic ventral hernia Skin: no ulcers; no Gangrene , no cellulitis; no open wounds. Pruritic ash on both ankles is helped by an OTC preparation with oatmeal.  VASCULAR EXAM  Carotid Bruits Left Right   Negative Negative   Radial pulses are 2+ palpable and =  Aortic pulse is not palpable, obese abdomen.   VASCULAR EXAM: Extremities without ischemic changes  without Gangrene; without open wounds, onchomycosis of toenails.     LE Pulses LEFT RIGHT   FEMORAL 2+ palpable 2+  palpable    POPLITEAL not palpable  not palpable   POSTERIOR TIBIAL not palpable  2+ palpable    DORSALIS PEDIS  ANTERIOR TIBIAL 1+ palpable  1+ palpable     Musculoskeletal: no muscle wasting or atrophy; no peripheral edema Neurologic: A&O X 3; Appropriate Affect ;  MOTOR FUNCTION: 5/5 Symmetric, CN 2-12 intact except for some hearing loss. Speech is fluent/normal               Non-Invasive Vascular Imaging (08/05/2015):  CEREBROVASCULAR DUPLEX EVALUATION    INDICATION: Carotid disease    PREVIOUS INTERVENTION(S):     DUPLEX EXAM:     RIGHT  LEFT  Peak Systolic Velocities (cm/s) End Diastolic Velocities (cm/s) Plaque LOCATION Peak Systolic Velocities (cm/s) End Diastolic Velocities (cm/s) Plaque  86 16  CCA PROXIMAL 126 31   69 11  CCA MID 130 30   55 12  CCA DISTAL 86 29   126 18  ECA 131 56 CP  25 0 HT ICA PROXIMAL 102 38 CP  Occluded  HT ICA MID 61 27   Occluded  HT ICA DISTAL 80 26      ICA / CCA Ratio (PSV)   Antegrade Vertebral Flow Antegrade   Brachial Systolic Pressure (mmHg)   Triphasic Subclavian Artery Waveforms Triphasic    Plaque Morphology:  HM = Homogeneous, HT = Heterogeneous, CP =  Calcific Plaque, SP = Smooth Plaque, IP = Irregular Plaque     ADDITIONAL FINDINGS: .    IMPRESSION: 1. 1.  Known right internal carotid artery stenosis. 2. 1-39% left internal carotid artery stenosis.    Compared to the previous exam:  No significant change noted when compared to the previous exam on 07/29/15.     ABDOMINAL AORTA DUPLEX EVALUATION    INDICATION: Abdominal aortic aneurysm    PREVIOUS INTERVENTION(S):     DUPLEX EXAM:     LOCATION DIAMETER AP (cm) DIAMETER TRANSVERSE (cm) VELOCITIES (cm/sec)  Aorta Proximal Not visualized Not visualized 63  Aorta Mid 2.3 2.4 71  Aorta Distal 3.44 3.43 43  Right Common Iliac Artery 1.78 1.6 45  Left Common Iliac Artery 1.6 1.7 54    Previous max aortic diameter:  3.7cm Date: 07/29/14     ADDITIONAL FINDINGS: . Decreased visualization of the abdominal vasculature due to overlying bowel gas and patient body habitus.    IMPRESSION: Aneurysmal dilatation of the distal abdominal aorta with a maximum diameter of 3.44 cm.    Compared to the previous exam:  No significant change in the abdominal aortic aneurysm when compared to the previous exams.        ASSESSMENT:  [name] is a 81 y.o. male whom we are following for carotid artery occlusive disease and a small aortic aneurysm. He has a known occlusion of his left internal carotid artery, we are monitoring the right ICA. He had an embolism in the right eye about 2003 or 2005, no subsequent stoke or TIA. He has no back or abdominal pain referable to AAA. He has no claudication symptoms with walking.  Today's carotid Duplex suggests known right internal carotid artery stenosis and 1-39% left internal carotid artery stenosis. No significant change noted when compared to the previous exam on 07/29/15.  His atherosclerotic risk factors include almost in control DM, former smoker, and obesity. He takes a statin and ASA.  Today's AAA Duplex suggests aneurysmal dilatation of  the distal abdominal aorta with a maximum diameter of 3.44 cm, normal diameters  of common iliac arteries.  No significant change in the abdominal aortic aneurysm when compared to the previous exams.   Fortunately his blood pressure is in control.   PLAN:   Based on today's exam and non-invasive vascular lab results, the patient will follow up in 1 year with the following tests: AAA Duplex and carotid Duplex. I discussed in depth with the patient the nature of atherosclerosis, and emphasized the importance of maximal medical management including strict control of blood pressure, blood glucose, and lipid levels, obtaining regular exercise, and cessation of smoking.  The patient is aware that without maximal medical management the underlying atherosclerotic disease process will progress, limiting the benefit of any interventions. Consideration for repair of AAA would be made when the size is 5.5 cm, growth > 1 cm/yr, and symptomatic status. The patient was given information about stroke prevention and what symptoms should prompt the patient to seek immediate medical care. The patient was given information about AAA including signs, symptoms, treatment,  what symptoms should prompt the patient to seek immediate medical care, and how to minimize the risk of enlargement and rupture of aneurysms.  Thank you for allowing [place] to participate in this patient's care.  Clemon Chambers, RN, MSN, FNP-C [facility] Office: [phone]  Clinic MD: Scot Dock 08/05/2015 10:13 AM

## 2015-08-05 NOTE — Patient Instructions (Addendum)
Stroke Prevention Some medical conditions and behaviors are associated with an increased chance of having a stroke. You may prevent a stroke by making healthy choices and managing medical conditions. HOW CAN I REDUCE MY RISK OF HAVING A STROKE?   Stay physically active. Get at least 30 minutes of activity on most or all days.  Do not smoke. It may also be helpful to avoid exposure to secondhand smoke.  Limit alcohol use. Moderate alcohol use is considered to be:  No more than 2 drinks per day for men.  No more than 1 drink per day for nonpregnant women.  Eat healthy foods. This involves:  Eating 5 or more servings of fruits and vegetables a day.  Making dietary changes that address high blood pressure (hypertension), high cholesterol, diabetes, or obesity.  Manage your cholesterol levels.  Making food choices that are high in fiber and low in saturated fat, trans fat, and cholesterol may control cholesterol levels.  Take any prescribed medicines to control cholesterol as directed by your health care provider.  Manage your diabetes.  Controlling your carbohydrate and sugar intake is recommended to manage diabetes.  Take any prescribed medicines to control diabetes as directed by your health care provider.  Control your hypertension.  Making food choices that are low in salt (sodium), saturated fat, trans fat, and cholesterol is recommended to manage hypertension.  Ask your health care provider if you need treatment to lower your blood pressure. Take any prescribed medicines to control hypertension as directed by your health care provider.  If you are 18-39 years of age, have your blood pressure checked every 3-5 years. If you are 40 years of age or older, have your blood pressure checked every year.  Maintain a healthy weight.  Reducing calorie intake and making food choices that are low in sodium, saturated fat, trans fat, and cholesterol are recommended to manage  weight.  Stop drug abuse.  Avoid taking birth control pills.  Talk to your health care provider about the risks of taking birth control pills if you are over 35 years old, smoke, get migraines, or have ever had a blood clot.  Get evaluated for sleep disorders (sleep apnea).  Talk to your health care provider about getting a sleep evaluation if you snore a lot or have excessive sleepiness.  Take medicines only as directed by your health care provider.  For some people, aspirin or blood thinners (anticoagulants) are helpful in reducing the risk of forming abnormal blood clots that can lead to stroke. If you have the irregular heart rhythm of atrial fibrillation, you should be on a blood thinner unless there is a good reason you cannot take them.  Understand all your medicine instructions.  Make sure that other conditions (such as anemia or atherosclerosis) are addressed. SEEK IMMEDIATE MEDICAL CARE IF:   You have sudden weakness or numbness of the face, arm, or leg, especially on one side of the body.  Your face or eyelid droops to one side.  You have sudden confusion.  You have trouble speaking (aphasia) or understanding.  You have sudden trouble seeing in one or both eyes.  You have sudden trouble walking.  You have dizziness.  You have a loss of balance or coordination.  You have a sudden, severe headache with no known cause.  You have new chest pain or an irregular heartbeat. Any of these symptoms may represent a serious problem that is an emergency. Do not wait to see if the symptoms will   go away. Get medical help at once. Call your local emergency services (911 in [place] Do not drive yourself to the hospital.   This information is not intended to replace advice given to you by your health care provider. Make sure you discuss any questions you have with your health care provider.   Document Released: 03/03/2004 Document Revised: 02/14/2014 Document Reviewed:  07/27/2012 Elsevier Interactive Patient Education 2016 Else[employer] Inc[employer]     Abdominal Aortic Aneurysm An aneurysm is a weakened or damaged part of an artery wall that bulges from the normal force of blood pumping through the body. An abdominal aortic aneurysm is an aneurysm that occurs in the lower part of the aorta, the main artery of the body.  The major concern with an abdominal aortic aneurysm is that it can enlarge and burst (rupture) or blood can flow between the layers of the wall of the aorta through a tear (aorticdissection). Both of these conditions can cause bleeding inside the body and can be life threatening unless diagnosed and treated promptly. CAUSES  The exact cause of an abdominal aortic aneurysm is unknown. Some contributing factors are:   A hardening of the arteries caused by the buildup of fat and other substances in the lining of a blood vessel (arteriosclerosis).  Inflammation of the walls of an artery (arteritis).   Connective tissue diseases, such as Marfan syndrome.   Abdominal trauma.   An infection, such as syphilis or staphylococcus, in the wall of the aorta (infectious aortitis) caused by bacteria. RISK FACTORS  Risk factors that contribute to an abdominal aortic aneurysm may include:  Age older than 60 years.   High blood pressure (hypertension).  Male gender.  Ethnicity (white race).  Obesity.  Family history of aneurysm (first degree relatives only).  Tobacco use. PREVENTION  The following healthy lifestyle habits may help decrease your risk of abdominal aortic aneurysm:  Quitting smoking. Smoking can raise your blood pressure and cause arteriosclerosis.  Limiting or avoiding alcohol.  Keeping your blood pressure, blood sugar level, and cholesterol levels within normal limits.  Decreasing your salt intake. In somepeople, too much salt can raise blood pressure and increase your risk of abdominal aortic aneurysm.  Eating a diet low in  saturated fats and cholesterol.  Increasing your fiber intake by including whole grains, vegetables, and fruits in your diet. Eating these foods may help lower blood pressure.  Maintaining a healthy weight.  Staying physically active and exercising regularly. SYMPTOMS  The symptoms of abdominal aortic aneurysm may vary depending on the size and rate of growth of the aneurysm.Most grow slowly and do not have any symptoms. When symptoms do occur, they may include:  Pain (abdomen, side, lower back, or groin). The pain may vary in intensity. A sudden onset of severe pain may indicate that the aneurysm has ruptured.  Feeling full after eating only small amounts of food.  Nausea or vomiting or both.  Feeling a pulsating lump in the abdomen.  Feeling faint or passing out. DIAGNOSIS  Since most unruptured abdominal aortic aneurysms have no symptoms, they are often discovered during diagnostic exams for other conditions. An aneurysm may be found during the following procedures:  Ultrasonography (A one-time screening for abdominal aortic aneurysm by ultrasonography is also recommended for all men aged 65-75 years who have ever smoked).  X-ray exams.  A computed tomography (CT).  Magnetic resonance imaging (MRI).  Angiography or arteriography. TREATMENT  Treatment of an abdominal aortic aneurysm depends on the size of   your aneurysm, your age, and risk factors for rupture. Medication to control blood pressure and pain may be used to manage aneurysms smaller than 6 cm. Regular monitoring for enlargement may be recommended by your caregiver if:  The aneurysm is 3-4 cm in size (an annual ultrasonography may be recommended).  The aneurysm is 4-4.5 cm in size (an ultrasonography every 6 months may be recommended).  The aneurysm is larger than 4.5 cm in size (your caregiver may ask that you be examined by a vascular surgeon). If your aneurysm is larger than 6 cm, surgical repair may be  recommended. There are two main methods for repair of an aneurysm:   Endovascular repair (a minimally invasive surgery). This is done most often.  Open repair. This method is used if an endovascular repair is not possible.   This information is not intended to replace advice given to you by your health care provider. Make sure you discuss any questions you have with your health care provider.   Document Released: 11/03/2004 Document Revised: 05/21/2012 Document Reviewed: 02/24/2012 Elsevier Interactive Patient Education 2016 Elsevier Inc.      Before your next abdominal ultrasound:  Take two Extra-Strength Gas-X capsules at bedtime the night before the test. Take another two Extra-Strength Gas-X capsules 3 hours before the test.    

## 2015-08-19 ENCOUNTER — Other Ambulatory Visit: Payer: Self-pay | Admitting: Internal Medicine

## 2015-11-02 ENCOUNTER — Other Ambulatory Visit: Payer: Self-pay | Admitting: Internal Medicine

## 2015-11-06 DIAGNOSIS — Z23 Encounter for immunization: Secondary | ICD-10-CM | POA: Diagnosis not present

## 2015-11-18 ENCOUNTER — Other Ambulatory Visit: Payer: Self-pay | Admitting: Internal Medicine

## 2015-12-02 DIAGNOSIS — E119 Type 2 diabetes mellitus without complications: Secondary | ICD-10-CM | POA: Diagnosis not present

## 2015-12-02 DIAGNOSIS — I6523 Occlusion and stenosis of bilateral carotid arteries: Secondary | ICD-10-CM | POA: Diagnosis not present

## 2015-12-02 DIAGNOSIS — E785 Hyperlipidemia, unspecified: Secondary | ICD-10-CM | POA: Diagnosis not present

## 2015-12-02 DIAGNOSIS — E668 Other obesity: Secondary | ICD-10-CM | POA: Diagnosis not present

## 2015-12-02 DIAGNOSIS — I429 Cardiomyopathy, unspecified: Secondary | ICD-10-CM | POA: Diagnosis not present

## 2015-12-02 DIAGNOSIS — I119 Hypertensive heart disease without heart failure: Secondary | ICD-10-CM | POA: Diagnosis not present

## 2015-12-02 DIAGNOSIS — I714 Abdominal aortic aneurysm, without rupture: Secondary | ICD-10-CM | POA: Diagnosis not present

## 2015-12-07 ENCOUNTER — Encounter: Payer: Self-pay | Admitting: Internal Medicine

## 2015-12-07 DIAGNOSIS — I428 Other cardiomyopathies: Secondary | ICD-10-CM | POA: Diagnosis not present

## 2015-12-21 ENCOUNTER — Other Ambulatory Visit: Payer: Self-pay | Admitting: Internal Medicine

## 2016-01-04 DIAGNOSIS — Z961 Presence of intraocular lens: Secondary | ICD-10-CM | POA: Diagnosis not present

## 2016-01-04 DIAGNOSIS — H353122 Nonexudative age-related macular degeneration, left eye, intermediate dry stage: Secondary | ICD-10-CM | POA: Diagnosis not present

## 2016-01-04 DIAGNOSIS — H53411 Scotoma involving central area, right eye: Secondary | ICD-10-CM | POA: Diagnosis not present

## 2016-01-04 DIAGNOSIS — E119 Type 2 diabetes mellitus without complications: Secondary | ICD-10-CM | POA: Diagnosis not present

## 2016-01-04 DIAGNOSIS — H353113 Nonexudative age-related macular degeneration, right eye, advanced atrophic without subfoveal involvement: Secondary | ICD-10-CM | POA: Diagnosis not present

## 2016-01-04 DIAGNOSIS — H34211 Partial retinal artery occlusion, right eye: Secondary | ICD-10-CM | POA: Diagnosis not present

## 2016-01-14 ENCOUNTER — Encounter: Payer: Self-pay | Admitting: Internal Medicine

## 2016-01-14 ENCOUNTER — Ambulatory Visit (INDEPENDENT_AMBULATORY_CARE_PROVIDER_SITE_OTHER): Payer: Medicare HMO | Admitting: Internal Medicine

## 2016-01-14 VITALS — BP 136/80 | HR 72 | Temp 98.3°F | Resp 20 | Wt 259.0 lb

## 2016-01-14 DIAGNOSIS — J019 Acute sinusitis, unspecified: Secondary | ICD-10-CM | POA: Diagnosis not present

## 2016-01-14 DIAGNOSIS — E119 Type 2 diabetes mellitus without complications: Secondary | ICD-10-CM | POA: Diagnosis not present

## 2016-01-14 DIAGNOSIS — Z0000 Encounter for general adult medical examination without abnormal findings: Secondary | ICD-10-CM | POA: Insufficient documentation

## 2016-01-14 DIAGNOSIS — I10 Essential (primary) hypertension: Secondary | ICD-10-CM | POA: Diagnosis not present

## 2016-01-14 DIAGNOSIS — Z0001 Encounter for general adult medical examination with abnormal findings: Secondary | ICD-10-CM

## 2016-01-14 DIAGNOSIS — E785 Hyperlipidemia, unspecified: Secondary | ICD-10-CM | POA: Diagnosis not present

## 2016-01-14 HISTORY — DX: Essential (primary) hypertension: I10

## 2016-01-14 HISTORY — DX: Encounter for general adult medical examination without abnormal findings: Z00.00

## 2016-01-14 MED ORDER — HYDROCODONE-HOMATROPINE 5-1.5 MG/5ML PO SYRP: 5.0000 mL | ORAL_SOLUTION | Freq: Four times a day (QID) | ORAL | 0 refills | Status: AC | PRN

## 2016-01-14 NOTE — Progress Notes (Signed)
Subjective:    Patient ID: [name], male    DOB: [birth year], 81 y.o.   MRN: [id number]  HPI  Here for wellness and f/u;  Overall doing ok;  Pt denies Chest pain, worsening SOB, DOE, wheezing, orthopnea, PND, worsening LE edema, palpitations, dizziness or syncope.  Pt denies neurological change such as new headache, facial or extremity weakness.  Pt denies polydipsia, polyuria, or low sugar symptoms. Pt states overall good compliance with treatment and medications, good tolerability, and has been trying to follow appropriate diet.  Pt denies worsening depressive symptoms, suicidal ideation or panic. No fever, night sweats, wt loss, loss of appetite, or other constitutional symptoms.  Pt states good ability with ADL's, has low fall risk, home safety reviewed and adequate, no other significant changes in hearing or vision, and only occasionally active with exercise.  Also,  Here with 2-3 days acute onset fever, facial pain, pressure, headache, general weakness and malaise, and greenish d/c, with mild ST and cough, but pt denies chest pain.  Also Has had some dyspnea worse in last few months for unclear reasons, non cardiac per pt per cards, and has cut back from usual level of activities such as golf, now with increased sugars to 245.  Is still trying to follow DM diet Past Medical History:  Diagnosis Date  . AAA (abdominal aortic aneurysm) ([place])    MONITORED BY DR LAWSON LOV JUNE 2014--  STABLE  AT 3.7  . Allergic rhinitis   . Arthritis   . Asymptomatic stenosis of left carotid artery without infarction    MILD DISEASE  . BPH (benign prostatic hypertrophy)   . Diverticulosis of colon   . History of cardiomyopathy    IDIOPATHIC-- PER [facility] NOTE 2013 , CURRENTLY RESOLVED  . History of kidney stones   . Hyperlipidemia   . Hypertension   . Peripheral vascular disease ([place])   . Phimosis   . Right carotid artery occlusion    CHRONIC   WITHOUT INFARTION  . Stroke [facility])     Right eye    . Type 2 diabetes mellitus ([place])   . Wears hearing aid    Past Surgical History:  Procedure Laterality Date  . CARDIOVASCULAR STRESS TEST  JUNE 2008  DR Wynonia Lawman  . CATARACT EXTRACTION W/ INTRAOCULAR LENS IMPLANT Right   . CIRCUMCISION N/A 08/30/2012   Procedure: CIRCUMCISION ADULT;  Surgeon: Malka So, MD;  Location: [facility];  Service: Urology;  Laterality: N/A;  . CYSTO/  RIGHT URETERAL STENT PLACEMENT  10-16-2003  . EXTRACORPOREAL SHOCK WAVE LITHOTRIPSY Right 2005  . EYE SURGERY    . JOINT REPLACEMENT Bilateral 2008  2009   Knee  . TOTAL KNEE ARTHROPLASTY Bilateral RIGHT 10-08-2007;   LEFT 09-30-2008  . TRANSTHORACIC ECHOCARDIOGRAM  10-21-2009   LVEF 50%    reports that [name] quit smoking about 33 years ago. His smoking use included Cigarettes. [name] has a 18.75 pack-year smoking history. [name] has never used smokeless tobacco. [name] reports that [name] does not drink alcohol or use drugs. family history includes ALS in his father; Asthma in his mother; COPD in his mother; Depression in his mother; Diabetes in his father; Heart disease in his mother; Hypertension in his mother; Kidney disease in his mother. No Known Allergies Current Outpatient Prescriptions on File Prior to Visit  Medication Sig Dispense Refill  . aspirin 81 MG tablet Take 81 mg by mouth daily.     Marland Kitchen atorvastatin (LIPITOR) 40 MG  tablet TAKE ONE (1) TABLET BY MOUTH EVERY DAY 90 tablet 0  . carvedilol (COREG) 12.5 MG tablet TAKE 1/2 TABLET BY MOUTH TWICE DAILY WITH A MEAL 90 tablet 1  . glipiZIDE (GLUCOTROL XL) 2.5 MG 24 hr tablet TAKE ONE (1) TABLET EACH DAY WITH BREAKFAST 90 tablet 0  . Glucosamine-Chondroit-Vit C-Mn (GLUCOSAMINE 1500 COMPLEX PO) Take 2 tablets by mouth daily.     . hydrochlorothiazide (HYDRODIURIL) 25 MG tablet TAKE ONE (1) TABLET EACH DAY 90 tablet 0  . hydrochlorothiazide (HYDRODIURIL) 25 MG tablet TAKE ONE (1) TABLET EACH DAY 90 tablet 0  . metFORMIN (GLUCOPHAGE) 1000 MG tablet Take 1  tablet (1,000 mg total) by mouth 2 (two) times daily. 180 tablet 3  . Multiple Vitamin (MULTIVITAMIN) capsule Take 1 capsule by mouth daily.      . multivitamin-lutein (OCUVITE-LUTEIN) CAPS capsule Take 1 capsule by mouth daily.    Marland Kitchen triamcinolone cream (KENALOG) 0.1 % Apply 1 application topically 2 (two) times daily. 30 g 0   No current facility-administered medications on file prior to visit.    Review of Systems Constitutional: Negative for increased diaphoresis, or other activity, appetite or siginficant weight change other than noted HENT: Negative for worsening hearing loss, ear pain, facial swelling, mouth sores and neck stiffness.   Eyes: Negative for other worsening pain, redness or visual disturbance.  Respiratory: Negative for choking or stridor Cardiovascular: Negative for other chest pain and palpitations.  Gastrointestinal: Negative for worsening diarrhea, blood in stool, or abdominal distention Genitourinary: Negative for hematuria, flank pain or change in urine volume.  Musculoskeletal: Negative for myalgias or other joint complaints.  Skin: Negative for other color change and wound or drainage.  Neurological: Negative for syncope and numbness. other than noted Hematological: Negative for adenopathy. or other swelling Psychiatric/Behavioral: Negative for hallucinations, SI, self-injury, decreased concentration or other worsening agitation.  All other system neg per pt    Objective:   Physical Exam BP 136/80   Pulse 72   Temp 98.3 F (36.8 C) (Oral)   Resp 20   Wt 259 lb (117.5 kg)   SpO2 96%   BMI 36.12 kg/m  VS noted,  Constitutional: Pt is oriented to person, place, and time. Appears well-developed and well-nourished, in no significant distress Head: Normocephalic and atraumatic  Eyes: Conjunctivae and EOM are normal. Pupils are equal, round, and reactive to light Right Ear: External ear normal.  Left Ear: External ear normal Nose: Nose normal.  Bilat tm's  with mild erythema.  Max sinus areas mild tender.  Pharynx with mild erythema, no exudate Mouth/Throat: Oropharynx is clear and moist  Neck: Normal range of motion. Neck supple. No JVD present. No tracheal deviation present or significant neck LA or mass Cardiovascular: Normal rate, regular rhythm, normal heart sounds and intact distal pulses.   Pulmonary/Chest: Effort normal and breath sounds without rales or wheezing  Abdominal: Soft. Bowel sounds are normal. NT. No HSM  Musculoskeletal: Normal range of motion. Exhibits no edema Lymphadenopathy: Has no cervical adenopathy.  Neurological: Pt is alert and oriented to person, place, and time. Pt has normal reflexes. No cranial nerve deficit. Motor grossly intact Skin: Skin is warm and dry. No rash noted or new ulcers Psychiatric:  Has normal mood and affect. Behavior is normal.  No other new exam findings    Assessment & Plan:

## 2016-01-14 NOTE — Progress Notes (Signed)
Pre visit review using our clinic review tool, if applicable. No additional management support is needed unless otherwise documented below in the visit note. 

## 2016-01-14 NOTE — Patient Instructions (Addendum)
Please take all new medication as prescribed - the antibiotic, and cough medicine  Please continue all other medications as before, and refills have been done if requested.  Please have the pharmacy call with any other refills you may need.  Please continue your efforts at being more active, low cholesterol diet, and weight control.  You are otherwise up to date with prevention measures today.  Please keep your appointments with your specialists as you may have planned  Please go to the LAB in the Basement (turn left off the elevator) for the tests to be done today  You will be contacted by phone if any changes need to be made immediately.  Otherwise, you will receive a letter about your results with an explanation, but please check with MyChart first.  Please remember to sign up for MyChart if you have not done so, as this will be important to you in the future with finding out test results, communicating by private email, and scheduling acute appointments online when needed.  Please return in 6 months, or sooner if needed, with Lab testing done 3-5 days before

## 2016-01-15 ENCOUNTER — Telehealth: Payer: Self-pay | Admitting: *Deleted

## 2016-01-15 MED ORDER — LEVOFLOXACIN 250 MG PO TABS: 250.0000 mg | ORAL_TABLET | Freq: Every day | ORAL | 0 refills | Status: DC

## 2016-01-15 NOTE — Telephone Encounter (Signed)
Notified pt wife rx has been sent...[name]

## 2016-01-15 NOTE — Telephone Encounter (Signed)
Wife left msg on triage stating husband saw MD yesterday, and he was going to send rx for antibiotic & cough syrup. Pharmcy only received script for cough syrup. Pls advise on antibiotic...[name]

## 2016-01-15 NOTE — Telephone Encounter (Signed)
Antibiotic sent

## 2016-01-17 NOTE — Assessment & Plan Note (Signed)
stable overall by history and exam, recent data reviewed with pt, and pt to continue medical treatment as before,  to f/u any worsening symptoms or concerns Lab Results  Component Value Date   LDLCALC 63 04/22/2015

## 2016-01-17 NOTE — Assessment & Plan Note (Signed)
BP Readings from Last 3 Encounters:  01/14/16 136/80  08/05/15 118/69  04/29/15 130/70   stable overall by history and exam, recent data reviewed with pt, and pt to continue medical treatment as before,  to f/u any worsening symptoms or concerns

## 2016-01-17 NOTE — Assessment & Plan Note (Signed)
stable overall by history and exam, recent data reviewed with pt, and pt to continue medical treatment as before,  to f/u any worsening symptoms or concerns Lab Results  Component Value Date   HGBA1C 7.2 (H) 04/22/2015

## 2016-01-17 NOTE — Assessment & Plan Note (Signed)

## 2016-01-17 NOTE — Assessment & Plan Note (Signed)
Mild to mod, for antibx course,  to f/u any worsening symptoms or concerns  In addition to the time spent performing CPE, I spent an additional 25 minutes face to face,in which greater than 50% of this time was spent in counseling and coordination of care for patient's acute illness as documented.  

## 2016-01-21 ENCOUNTER — Telehealth: Payer: Self-pay | Admitting: Internal Medicine

## 2016-01-21 NOTE — Telephone Encounter (Signed)
For congestion, ok to start mucinex bid prn otc, and even try otc allegra and/or nasacort, but we normally do not recommend decongestants such as sudafed due to risk of higher blood pressure

## 2016-01-21 NOTE — Telephone Encounter (Signed)
Patient aware.

## 2016-01-21 NOTE — Telephone Encounter (Signed)
Patient is still experiencing heavy congestion. Asking if we can send in something else to help combat it. Pharmacy is [facility]- on file.

## 2016-02-03 ENCOUNTER — Other Ambulatory Visit: Payer: Self-pay | Admitting: Internal Medicine

## 2016-02-29 DIAGNOSIS — M7061 Trochanteric bursitis, right hip: Secondary | ICD-10-CM | POA: Diagnosis not present

## 2016-03-08 DIAGNOSIS — M7061 Trochanteric bursitis, right hip: Secondary | ICD-10-CM | POA: Diagnosis not present

## 2016-03-08 DIAGNOSIS — M545 Low back pain: Secondary | ICD-10-CM | POA: Diagnosis not present

## 2016-03-15 DIAGNOSIS — M7061 Trochanteric bursitis, right hip: Secondary | ICD-10-CM | POA: Diagnosis not present

## 2016-03-15 DIAGNOSIS — M545 Low back pain: Secondary | ICD-10-CM | POA: Diagnosis not present

## 2016-03-21 ENCOUNTER — Other Ambulatory Visit: Payer: Self-pay | Admitting: Internal Medicine

## 2016-03-24 DIAGNOSIS — M7061 Trochanteric bursitis, right hip: Secondary | ICD-10-CM | POA: Diagnosis not present

## 2016-03-24 DIAGNOSIS — M9983 Other biomechanical lesions of lumbar region: Secondary | ICD-10-CM | POA: Diagnosis not present

## 2016-03-24 DIAGNOSIS — M5416 Radiculopathy, lumbar region: Secondary | ICD-10-CM | POA: Diagnosis not present

## 2016-03-24 DIAGNOSIS — M5136 Other intervertebral disc degeneration, lumbar region: Secondary | ICD-10-CM | POA: Diagnosis not present

## 2016-04-12 DIAGNOSIS — M9983 Other biomechanical lesions of lumbar region: Secondary | ICD-10-CM | POA: Diagnosis not present

## 2016-04-26 DIAGNOSIS — M48062 Spinal stenosis, lumbar region with neurogenic claudication: Secondary | ICD-10-CM | POA: Diagnosis not present

## 2016-04-26 DIAGNOSIS — M545 Low back pain: Secondary | ICD-10-CM | POA: Diagnosis not present

## 2016-04-26 DIAGNOSIS — M5431 Sciatica, right side: Secondary | ICD-10-CM | POA: Diagnosis not present

## 2016-05-03 ENCOUNTER — Other Ambulatory Visit: Payer: Self-pay | Admitting: Internal Medicine

## 2016-05-03 DIAGNOSIS — M5416 Radiculopathy, lumbar region: Secondary | ICD-10-CM | POA: Diagnosis not present

## 2016-05-03 DIAGNOSIS — M48062 Spinal stenosis, lumbar region with neurogenic claudication: Secondary | ICD-10-CM | POA: Diagnosis not present

## 2016-05-03 DIAGNOSIS — M5431 Sciatica, right side: Secondary | ICD-10-CM | POA: Diagnosis not present

## 2016-05-03 DIAGNOSIS — M5136 Other intervertebral disc degeneration, lumbar region: Secondary | ICD-10-CM | POA: Diagnosis not present

## 2016-05-05 ENCOUNTER — Other Ambulatory Visit: Payer: Self-pay | Admitting: Internal Medicine

## 2016-05-10 ENCOUNTER — Ambulatory Visit (INDEPENDENT_AMBULATORY_CARE_PROVIDER_SITE_OTHER): Payer: Medicare HMO | Admitting: Internal Medicine

## 2016-05-10 ENCOUNTER — Encounter: Payer: Self-pay | Admitting: Internal Medicine

## 2016-05-10 ENCOUNTER — Other Ambulatory Visit (INDEPENDENT_AMBULATORY_CARE_PROVIDER_SITE_OTHER): Payer: Medicare HMO

## 2016-05-10 VITALS — BP 116/82 | HR 68 | Temp 97.6°F | Wt 243.0 lb

## 2016-05-10 DIAGNOSIS — R21 Rash and other nonspecific skin eruption: Secondary | ICD-10-CM | POA: Diagnosis not present

## 2016-05-10 DIAGNOSIS — E119 Type 2 diabetes mellitus without complications: Secondary | ICD-10-CM | POA: Diagnosis not present

## 2016-05-10 DIAGNOSIS — E785 Hyperlipidemia, unspecified: Secondary | ICD-10-CM | POA: Diagnosis not present

## 2016-05-10 DIAGNOSIS — Z01818 Encounter for other preprocedural examination: Secondary | ICD-10-CM

## 2016-05-10 DIAGNOSIS — I10 Essential (primary) hypertension: Secondary | ICD-10-CM | POA: Diagnosis not present

## 2016-05-10 HISTORY — DX: Encounter for other preprocedural examination: Z01.818

## 2016-05-10 LAB — LIPID PANEL
CHOL/HDL RATIO: 4
CHOLESTEROL: 144 mg/dL (ref 0–200)
HDL: 39.9 mg/dL (ref >39.00)
LDL CALC: 70 mg/dL (ref 0–99)
NonHDL: 103.68
TRIGLYCERIDES: 168 mg/dL — AB (ref 0.0–149.0)
VLDL: 33.6 mg/dL (ref 0.0–40.0)

## 2016-05-10 LAB — HEPATIC FUNCTION PANEL
ALBUMIN: 4.5 g/dL (ref 3.5–5.2)
ALT: 33 U/L (ref 0–53)
AST: 28 U/L (ref 0–37)
Alkaline Phosphatase: 189 U/L — ABNORMAL HIGH (ref 39–117)
BILIRUBIN TOTAL: 0.8 mg/dL (ref 0.2–1.2)
Bilirubin, Direct: 0.3 mg/dL (ref 0.0–0.3)
Total Protein: 8 g/dL (ref 6.0–8.3)

## 2016-05-10 LAB — BASIC METABOLIC PANEL
BUN: 18 mg/dL (ref 6–23)
CHLORIDE: 97 meq/L (ref 96–112)
CO2: 30 mEq/L (ref 19–32)
CREATININE: 1.03 mg/dL (ref 0.40–1.50)
Calcium: 9.7 mg/dL (ref 8.4–10.5)
GFR: 73.98 mL/min (ref >60.00)
GLUCOSE: 221 mg/dL — AB (ref 70–99)
POTASSIUM: 3.5 meq/L (ref 3.5–5.1)
Sodium: 137 mEq/L (ref 135–145)

## 2016-05-10 LAB — HEMOGLOBIN A1C: Hgb A1c MFr Bld: 7.4 % — ABNORMAL HIGH (ref 4.6–6.5)

## 2016-05-10 NOTE — Patient Instructions (Addendum)
Your EKG was OK today  Please continue all other medications as before, and refills have been done if requested.  Please have the pharmacy call with any other refills you may need.  Please continue your efforts at being more active, low cholesterol diet, and weight control.  You are otherwise up to date with prevention measures today.  Please keep your appointments with your specialists as you may have planned  Please go to the LAB in the Basement (turn left off the elevator) for the tests to be done today  You will be contacted by phone if any changes need to be made immediately.  Otherwise, you will receive a letter about your results with an explanation, but please check with MyChart first.  Please remember to sign up for MyChart if you have not done so, as this will be important to you in the future with finding out test results, communicating by private email, and scheduling acute appointments online when needed.  Your form for back surgury will be sent if the labs are ok  You will be contacted regarding the referral for: Dermatology

## 2016-05-10 NOTE — Progress Notes (Signed)
Subjective:    Patient ID: [name], male    DOB: May 15, 1934, 82 y.o.   MRN: [id number]  HPI  Here to f/u; overall doing ok,  Pt denies chest pain, increasing sob or doe, wheezing, orthopnea, PND, increased LE swelling, palpitations, dizziness or syncope.  Pt denies new neurological symptoms such as new headache, or facial or extremity weakness or numbness.  Pt denies polydipsia, polyuria, or low sugar episode.   Pt denies new neurological symptoms such as new headache, or facial or extremity weakness or numbness.   Pt states overall good compliance with meds, mostly trying to follow appropriate diet, with wt overall stable,  but little exercise however. Due for surgury lumbar decompression soon, needs form filled out. Also with non itchy erythem rash to LLE for several weeks, not getting better Past Medical History:  Diagnosis Date  . AAA (abdominal aortic aneurysm) ([place])    MONITORED BY DR LAWSON LOV JUNE 2014--  STABLE  AT 3.7  . Allergic rhinitis   . Arthritis   . Asymptomatic stenosis of left carotid artery without infarction    MILD DISEASE  . BPH (benign prostatic hypertrophy)   . Diverticulosis of colon   . History of cardiomyopathy    IDIOPATHIC-- PER [facility] NOTE 2013 , CURRENTLY RESOLVED  . History of kidney stones   . Hyperlipidemia   . Hypertension   . Peripheral vascular disease ([place])   . Phimosis   . Right carotid artery occlusion    CHRONIC   WITHOUT INFARTION  . Stroke [facility])     Right eye  . Type 2 diabetes mellitus ([place])   . Wears hearing aid    Past Surgical History:  Procedure Laterality Date  . CARDIOVASCULAR STRESS TEST  JUNE 2008  DR Wynonia Lawman  . CATARACT EXTRACTION W/ INTRAOCULAR LENS IMPLANT Right   . CIRCUMCISION N/A 08/30/2012   Procedure: CIRCUMCISION ADULT;  Surgeon: Malka So, MD;  Location: [facility];  Service: Urology;  Laterality: N/A;  . CYSTO/  RIGHT URETERAL STENT PLACEMENT  10-16-2003  . EXTRACORPOREAL SHOCK WAVE  LITHOTRIPSY Right 2005  . EYE SURGERY    . JOINT REPLACEMENT Bilateral 2008  2009   Knee  . TOTAL KNEE ARTHROPLASTY Bilateral RIGHT 10-08-2007;   LEFT 09-30-2008  . TRANSTHORACIC ECHOCARDIOGRAM  10-21-2009   LVEF 50%    reports that he quit smoking about 33 years ago. His smoking use included Cigarettes. He has a 18.75 pack-year smoking history. He has never used smokeless tobacco. He reports that he does not drink alcohol or use drugs. family history includes ALS in his father; Asthma in his mother; COPD in his mother; Depression in his mother; Diabetes in his father; Heart disease in his mother; Hypertension in his mother; Kidney disease in his mother. No Known Allergies Current Outpatient Prescriptions on File Prior to Visit  Medication Sig Dispense Refill  . aspirin 81 MG tablet Take 81 mg by mouth daily.     Marland Kitchen atorvastatin (LIPITOR) 40 MG tablet TAKE ONE (1) TABLET EACH DAY 90 tablet 2  . carvedilol (COREG) 12.5 MG tablet TAKE 1/2 TABLET BY MOUTH TWICE DAILY WITH A MEAL 90 tablet 0  . Glucosamine-Chondroit-Vit C-Mn (GLUCOSAMINE 1500 COMPLEX PO) Take 2 tablets by mouth daily.     . hydrochlorothiazide (HYDRODIURIL) 25 MG tablet TAKE ONE (1) TABLET EACH DAY 90 tablet 2  . metFORMIN (GLUCOPHAGE) 1000 MG tablet Take 1 tablet (1,000 mg total) by mouth 2 (two) times daily.  180 tablet 3  . Multiple Vitamin (MULTIVITAMIN) capsule Take 1 capsule by mouth daily.      . multivitamin-lutein (OCUVITE-LUTEIN) CAPS capsule Take 1 capsule by mouth daily.    Marland Kitchen triamcinolone cream (KENALOG) 0.1 % Apply 1 application topically 2 (two) times daily. 30 g 0   No current facility-administered medications on file prior to visit.    Review of Systems  Constitutional: Negative for other unusual diaphoresis or sweats HENT: Negative for ear discharge or swelling Eyes: Negative for other worsening visual disturbances Respiratory: Negative for stridor or other swelling  Gastrointestinal: Negative for worsening  distension or other blood Genitourinary: Negative for retention or other urinary change Musculoskeletal: Negative for other MSK pain or swelling Skin: Negative for color change or other new lesions Neurological: Negative for worsening tremors and other numbness  Psychiatric/Behavioral: Negative for worsening agitation or other fatigue All other system neg per pt    Objective:   Physical Exam BP 116/82   Pulse 68   Temp 97.6 F (36.4 C) (Oral)   Wt 243 lb (110.2 kg)   SpO2 97%   BMI 33.89 kg/m  VS noted,  Constitutional: Pt appears in NAD HENT: Head: NCAT.  Right Ear: External ear normal.  Left Ear: External ear normal.  Eyes: . Pupils are equal, round, and reactive to light. Conjunctivae and EOM are normal Nose: without d/c or deformity Neck: Neck supple. Gross normal ROM Cardiovascular: Normal rate and regular rhythm.   Pulmonary/Chest: Effort normal and breath sounds without rales or wheezing.  Abd:  Soft, NT, ND, + BS, no organomegaly Neurological: Pt is alert. At baseline orientation, motor grossly intact Skin: Skin is warm.has nontender non raised erythem patch rashes to LLE, other new lesions, no LE edema Psychiatric: Pt behavior is normal without agitation  No other exam findings  ECG today I have personally interpreted Sinus  Rhythm  - occasional PAC     -Nonspecific ST depression   +   Nonspecific T-abnormality  -     Assessment & Plan:

## 2016-05-10 NOTE — Progress Notes (Signed)
Pre visit review using our clinic review tool, if applicable. No additional management support is needed unless otherwise documented below in the visit note. 

## 2016-05-11 ENCOUNTER — Other Ambulatory Visit: Payer: Self-pay | Admitting: Internal Medicine

## 2016-05-11 ENCOUNTER — Telehealth: Payer: Self-pay

## 2016-05-11 MED ORDER — GLIPIZIDE ER 5 MG PO TB24: 5.0000 mg | ORAL_TABLET | Freq: Every day | ORAL | 3 refills | Status: DC

## 2016-05-11 NOTE — Telephone Encounter (Signed)
Called pt, LVM.   

## 2016-05-11 NOTE — Telephone Encounter (Signed)
-----   Message from Biagio Borg, MD sent at 05/11/2016  8:19 AM EDT ----- Left message on MyChart, pt to cont same tx except  The test results show that your current treatment is OK, except the a1c is still mildly too high.  Please increase the Glipizide ER to 5 mg per day.  A new prescription will be sent, and you should be notified.Redmond Baseman to please inform pt, I will do rx

## 2016-05-16 NOTE — Assessment & Plan Note (Signed)
stable overall by history and exam, recent data reviewed with pt, and pt to continue medical treatment as before,  to f/u any worsening symptoms or concerns Lab Results  Component Value Date   LDLCALC 70 05/10/2016

## 2016-05-16 NOTE — Assessment & Plan Note (Signed)
[place] for [employer], form to be faxed

## 2016-05-16 NOTE — Assessment & Plan Note (Signed)
stable overall by history and exam, recent data reviewed with pt, and pt to continue medical treatment as before,  to f/u any worsening symptoms or concerns Lab Results  Component Value Date   HGBA1C 7.4 (H) 05/10/2016

## 2016-05-16 NOTE — Assessment & Plan Note (Signed)
stable overall by history and exam, recent data reviewed with pt, and pt to continue medical treatment as before,  to f/u any worsening symptoms or concerns BP Readings from Last 3 Encounters:  05/10/16 116/82  01/14/16 136/80  08/05/15 118/69

## 2016-05-16 NOTE — Assessment & Plan Note (Signed)
For derm referral

## 2016-05-18 ENCOUNTER — Encounter (HOSPITAL_COMMUNITY): Payer: Self-pay | Admitting: *Deleted

## 2016-05-18 ENCOUNTER — Other Ambulatory Visit: Payer: Self-pay | Admitting: Specialist

## 2016-05-20 ENCOUNTER — Ambulatory Visit: Payer: Self-pay | Admitting: Orthopedic Surgery

## 2016-05-20 ENCOUNTER — Ambulatory Visit: Payer: Self-pay | Admitting: Specialist

## 2016-05-20 NOTE — H&P (Signed)
[name] is an 82 y.o. male.   Chief Complaint: back and leg pain HPI: The patient is a 82 year old male who presents today for follow up of their back. The patient is being followed for their back pain. They are now 4 month(s) out from when symptoms began. Symptoms reported today include: pain. Current treatment includes: activity modification, NSAIDs (Naprosyn) and pain medications. The following medication has been used for pain control: antiinflammatory medication and Ultram. The patient reports their current pain level to be severe and 4 / 10.  [name] follows up with his wife. He is recovering from surgical decompression and has leg pain. She is here with her husband and mainly has pain down into the right. We discussed previously a decompression at L5-S1. Discussed with the radiologist and had the axial image re-configured that does increase the stenosis at 5-1 and radiates into the buttock and down into the leg. He does have some back pain, but predominantly leg pain.   Past Medical History:  Diagnosis Date  . AAA (abdominal aortic aneurysm) ([place])    MONITORED BY DR LAWSON LOV JUNE 2014--  STABLE  AT 3.7  . Allergic rhinitis   . Arthritis   . Asymptomatic stenosis of left carotid artery without infarction    MILD DISEASE  . BPH (benign prostatic hypertrophy)   . Diverticulosis of colon   . History of cardiomyopathy    IDIOPATHIC-- PER [place] NOTE 2013 , CURRENTLY RESOLVED  . History of kidney stones   . Hyperlipidemia   . Hypertension   . Peripheral vascular disease ([place])   . Phimosis   . Right carotid artery occlusion    CHRONIC   WITHOUT INFARTION  . Stroke [facility])     Right eye  . Type 2 diabetes mellitus ([place])   . Wears hearing aid     Past Surgical History:  Procedure Laterality Date  . CARDIOVASCULAR STRESS TEST  JUNE 2008  DR Wynonia Lawman  . CATARACT EXTRACTION W/ INTRAOCULAR LENS IMPLANT Right   . CIRCUMCISION N/A 08/30/2012   Procedure: CIRCUMCISION ADULT;   Surgeon: Malka So, MD;  Location: [facility];  Service: Urology;  Laterality: N/A;  . CYSTO/  RIGHT URETERAL STENT PLACEMENT  10-16-2003  . EXTRACORPOREAL SHOCK WAVE LITHOTRIPSY Right 2005  . EYE SURGERY    . JOINT REPLACEMENT Bilateral 2008  2009   Knee  . TOTAL KNEE ARTHROPLASTY Bilateral RIGHT 10-08-2007;   LEFT 09-30-2008  . TRANSTHORACIC ECHOCARDIOGRAM  10-21-2009   LVEF 50%    Family History  Problem Relation Age of Onset  . Heart disease Mother     CHF  Before age 60  . COPD Mother   . Kidney disease Mother   . Hypertension Mother   . Asthma Mother   . Depression Mother   . ALS Father   . Diabetes Father   . Colon cancer Neg Hx    Social History:  reports that he quit smoking about 33 years ago. His smoking use included Cigarettes. He has a 18.75 pack-year smoking history. He has never used smokeless tobacco. He reports that he does not drink alcohol or use drugs.  Allergies: No Known Allergies   (Not in a hospital admission)  No results found for this or any previous visit (from the past 48 hour(s)). No results found.  Review of Systems  Constitutional: Negative.   HENT: Negative.   Eyes: Negative.   Respiratory: Negative.   Cardiovascular: Negative.  Gastrointestinal: Negative.   Genitourinary: Negative.   Musculoskeletal: Positive for back pain.  Skin: Negative.   Neurological: Positive for sensory change and focal weakness.  Psychiatric/Behavioral: Negative.     There were no vitals taken for this visit. Physical Exam  Constitutional: He is oriented to person, place, and time. He appears well-developed. He appears distressed.  HENT:  Head: Normocephalic.  Eyes: Pupils are equal, round, and reactive to light.  Neck: Normal range of motion.  Cardiovascular: Normal rate.   Respiratory: Effort normal.  GI: Soft.  Musculoskeletal:  On exam, he is in moderate distress. Mood and affect is appropriate. He has pain with extension,  relieved with forward flexion. Straight leg raise, buttock, thigh and calf pain on the right, negative on the left. EHL is 4+/5 on the right compared to the left, pain with flexion and extension.  Lumbar spine exam reveals no evidence of soft tissue swelling, deformity or skin ecchymosis. On palpation there is no tenderness of the lumbar spine. No flank pain with percussion. The abdomen is soft and nontender. Nontender over the trochanters. No cellulitis or lymphadenopathy.  Good range of motion of the lumbar spine without associated pain. Motor is 5/5 including tibialis anterior, plantar flexion, quadriceps and hamstrings. Patient is normoreflexic. There is no Babinski or clonus. Sensory exam is intact to light touch. Patient has good distal pulses. No DVT. No pain and normal range of motion without instability of the hips, knees and ankles.  He has painless range of motion of the cervical spine.  Neurological: He is alert and oriented to person, place, and time.    MRI was reviewed with he and his wife demonstrating multifactorial severe stenosis at 5-1. He does have multilevel disk degeneration.  Assessment/Plan 1. Neurogenic claudication secondary to spinal stenosis, right lower extremity radicular pain secondary to lateral recess stenosis, refractory 2. Multilevel disk degeneration.   Recommendations and discussion: We had an extensive discussion concerning current pathology, relevant anatomy and treatment options. We discussed either living with his symptoms, he only had temporary relief from corticosteroid injection by Dr. Nelva Bush. Discussed other options including decompression at 5-1. We suggest having a laminectomy to decompress to above the pedicle of L5 which would extend up into the L4-5 interspace.  I had an extensive discussion of the risks and benefits of the lumbar decompression with the patient including bleeding, infection, damage to neurovascular structures, epidural fibrosis, CSF  leak requiring repair. We also discussed increase in pain, adjacent segment disease, recurrent disc herniation, need for future surgery including repeat decompression and/or fusion. We also discussed risks of postoperative hematoma, paralysis, anesthetic complications including DVT, PE, death, cardiopulmonary dysfunction. In addition, the perioperative and postoperative courses were discussed in detail including the rehabilitative time and return to functional activity and work. I provided the patient with an illustrated handout and utilized the appropriate surgical models.  We discussed the postoperative course in detail, overnight in the hospital, sutures out in two weeks at maximum medical improvement in three months. Early ambulation. No history of MRSA or blood clot. He would like to proceed with the procedure. We did discuss; however, and emphasize this was mainly for buttock and leg pain as opposed to back pain which really then handled conservatively. We will have him scheduled for that procedure.  Plan microlumbar decompression L4-5, L5-S1  Cecilie Kicks., PA-C for Dr. Tonita Cong 05/20/2016, 12:32 PM

## 2016-05-30 ENCOUNTER — Other Ambulatory Visit: Payer: Self-pay | Admitting: Specialist

## 2016-06-02 ENCOUNTER — Other Ambulatory Visit (HOSPITAL_COMMUNITY): Payer: Self-pay | Admitting: Emergency Medicine

## 2016-06-02 NOTE — Patient Instructions (Addendum)
[name]  06/02/2016   Your procedure is scheduled on: 06-08-16  Report to [facility] Main  Entrance Take [place]  elevators to 3rd floor to  [place] at 630AM.   Call this number if you have problems the morning of surgery [phone]    Remember: ONLY 1 PERSON MAY GO WITH YOU TO SHORT STAY TO GET  READY MORNING OF [place].  Do not eat food or drink liquids :After Midnight.     Take these medicines the morning of surgery with A SIP OF WATER: carvedilol(coreg), atorvastatin(lipitor), tramadol as needed, tylenol as needed  DO NOT TAKE ANY DIABETIC MEDICATIONS DAY OF YOUR SURGERY                               You may not have any metal on your body including hair pins and              piercings  Do not wear jewelry, make-up, lotions, powders or perfumes, deodorant              Men may shave face and neck.   Do not bring valuables to the hospital. [place].  Contacts, dentures or bridgework may not be worn into surgery.  Leave suitcase in the car. After surgery it may be brought to your room.               Please read over the following fact sheets you were given: _____________________________________________________________________             How to Manage Your Diabetes Before and After Surgery  Why is it important to control my blood sugar before and after surgery? . Improving blood sugar levels before and after surgery helps healing and can limit problems. . A way of improving blood sugar control is eating a healthy diet by: o  Eating less sugar and carbohydrates o  Increasing activity/exercise o  Talking with your doctor about reaching your blood sugar goals . High blood sugars (greater than 180 mg/dL) can raise your risk of infections and slow your recovery, so you will need to focus on controlling your diabetes during the weeks before surgery. . Make sure that the doctor who  takes care of your diabetes knows about your planned surgery including the date and location.  How do I manage my blood sugar before surgery? . Check your blood sugar at least 4 times a day, starting 2 days before surgery, to make sure that the level is not too high or low. o Check your blood sugar the morning of your surgery when you wake up and every 2 hours until you get to the Short Stay unit. . If your blood sugar is less than 70 mg/dL, you will need to treat for low blood sugar: o Do not take insulin. o Treat a low blood sugar (less than 70 mg/dL) with  cup of clear juice (cranberry or apple), 4 glucose tablets, OR glucose gel. o Recheck blood sugar in 15 minutes after treatment (to make sure it is greater than 70 mg/dL). If your blood sugar is not greater than 70 mg/dL on recheck, call [phone] for further instructions. . Report your blood sugar to the short stay nurse  when you get to Short Stay.  . If you are admitted to the hospital after surgery: o Your blood sugar will be checked by the staff and you will probably be given insulin after surgery (instead of oral diabetes medicines) to make sure you have good blood sugar levels. o The goal for blood sugar control after surgery is 80-180 mg/dL.   WHAT DO I DO ABOUT MY DIABETES MEDICATION?  Marland Kitchen Do not take oral diabetes medicines (pills) the morning of surgery.  . THE DAY BEFORE SURGERY 06-07-16       Take your morning dose of GLIPIZIDE(GLUCOTROL) as usual   Take you doses of METFORMIN as usual  . THE MORNING OF SURGERY 06-08-16   Do not take any medication for diabetes   Patient Signature:  Date:   Nurse Signature:  Date:   Reviewed and Endorsed by [place] Patient Education Committee, August 2015  [facility] - Preparing for Surgery Before surgery, you can play an important role.  Because skin is not sterile, your skin needs to be as free of germs as possible.  You can reduce the number of germs on your skin by washing  with CHG (chlorahexidine gluconate) soap before surgery.  CHG is an antiseptic cleaner which kills germs and bonds with the skin to continue killing germs even after washing. Please DO NOT use if you have an allergy to CHG or antibacterial soaps.  If your skin becomes reddened/irritated stop using the CHG and inform your nurse when you arrive at Short Stay. Do not shave (including legs and underarms) for at least 48 hours prior to the first CHG shower.  You may shave your face/neck. Please follow these instructions carefully:  1.  Shower with CHG Soap the night before surgery and the  morning of Surgery.  2.  If you choose to wash your hair, wash your hair first as usual with your  normal  shampoo.  3.  After you shampoo, rinse your hair and body thoroughly to remove the  shampoo.                           4.  Use CHG as you would any other liquid soap.  You can apply chg directly  to the skin and wash                       Gently with a scrungie or clean washcloth.  5.  Apply the CHG Soap to your body ONLY FROM THE NECK DOWN.   Do not use on face/ open                           Wound or open sores. Avoid contact with eyes, ears mouth and genitals (private parts).                       Wash face,  Genitals (private parts) with your normal soap.             6.  Wash thoroughly, paying special attention to the area where your surgery  will be performed.  7.  Thoroughly rinse your body with warm water from the neck down.  8.  DO NOT shower/wash with your normal soap after using and rinsing off  the CHG Soap.                9.  Pat yourself dry with a clean towel.            10.  Wear clean pajamas.            11.  Place clean sheets on your bed the night of your first shower and do not  sleep with pets. Day of Surgery : Do not apply any lotions/deodorants the morning of surgery.  Please wear clean clothes to the hospital/surgery center.  FAILURE TO FOLLOW THESE INSTRUCTIONS MAY RESULT IN THE  CANCELLATION OF YOUR SURGERY PATIENT SIGNATURE_________________________________  NURSE SIGNATURE__________________________________  ________________________________________________________________________   Adam Phenix  An incentive spirometer is a tool that can help keep your lungs clear and active. This tool measures how well you are filling your lungs with each breath. Taking long deep breaths may help reverse or decrease the chance of developing breathing (pulmonary) problems (especially infection) following:  A long period of time when you are unable to move or be active. BEFORE THE PROCEDURE   If the spirometer includes an indicator to show your best effort, your nurse or respiratory therapist will set it to a desired goal.  If possible, sit up straight or lean slightly forward. Try not to slouch.  Hold the incentive spirometer in an upright position. INSTRUCTIONS FOR USE  1. Sit on the edge of your bed if possible, or sit up as far as you can in bed or on a chair. 2. Hold the incentive spirometer in an upright position. 3. Breathe out normally. 4. Place the mouthpiece in your mouth and seal your lips tightly around it. 5. Breathe in slowly and as deeply as possible, raising the piston or the ball toward the top of the column. 6. Hold your breath for 3-5 seconds or for as long as possible. Allow the piston or ball to fall to the bottom of the column. 7. Remove the mouthpiece from your mouth and breathe out normally. 8. Rest for a few seconds and repeat Steps 1 through 7 at least 10 times every 1-2 hours when you are awake. Take your time and take a few normal breaths between deep breaths. 9. The spirometer may include an indicator to show your best effort. Use the indicator as a goal to work toward during each repetition. 10. After each set of 10 deep breaths, practice coughing to be sure your lungs are clear. If you have an incision (the cut made at the time of surgery),  support your incision when coughing by placing a pillow or rolled up towels firmly against it. Once you are able to get out of bed, walk around indoors and cough well. You may stop using the incentive spirometer when instructed by your caregiver.  RISKS AND COMPLICATIONS  Take your time so you do not get dizzy or light-headed.  If you are in pain, you may need to take or ask for pain medication before doing incentive spirometry. It is harder to take a deep breath if you are having pain. AFTER USE  Rest and breathe slowly and easily.  It can be helpful to keep track of a log of your progress. Your caregiver can provide you with a simple table to help with this. If you are using the spirometer at home, follow these instructions: [place] IF:   You are having difficultly using the spirometer.  You have trouble using the spirometer as often as instructed.  Your pain medication is not giving enough relief while using the spirometer.  You develop fever  of 100.5 F (38.1 C) or higher. SEEK IMMEDIATE MEDICAL CARE IF:   You cough up bloody sputum that had not been present before.  You develop fever of 102 F (38.9 C) or greater.  You develop worsening pain at or near the incision site. MAKE SURE YOU:   Understand these instructions.  Will watch your condition.  Will get help right away if you are not doing well or get worse. Document Released: 06/06/2006 Document Revised: 04/18/2011 Document Reviewed: 08/07/2006 [facility] Patient Information 2014 [place], [place].   ________________________________________________________________________

## 2016-06-02 NOTE — Progress Notes (Signed)
LOV/ medical clearance Dr Jenny Reichmann 05-10-16 epic EKG 05-10-16 epic ECHO 12-07-15 epic

## 2016-06-03 ENCOUNTER — Ambulatory Visit (HOSPITAL_COMMUNITY)
Admission: RE | Admit: 2016-06-03 | Discharge: 2016-06-03 | Disposition: A | Discharge status: Home / Self Care | Payer: Medicare HMO | Source: Ambulatory Visit | Attending: Specialist | Admitting: Specialist

## 2016-06-03 ENCOUNTER — Encounter (HOSPITAL_COMMUNITY)
Admission: RE | Admit: 2016-06-03 | Discharge: 2016-06-03 | Disposition: A | Discharge status: Home / Self Care | Payer: Medicare HMO | Source: Ambulatory Visit | Attending: Specialist | Admitting: Specialist

## 2016-06-03 DIAGNOSIS — M5126 Other intervertebral disc displacement, lumbar region: Secondary | ICD-10-CM

## 2016-06-03 DIAGNOSIS — Z01812 Encounter for preprocedural laboratory examination: Secondary | ICD-10-CM | POA: Insufficient documentation

## 2016-06-03 DIAGNOSIS — M47816 Spondylosis without myelopathy or radiculopathy, lumbar region: Secondary | ICD-10-CM | POA: Insufficient documentation

## 2016-06-03 DIAGNOSIS — Z01818 Encounter for other preprocedural examination: Secondary | ICD-10-CM | POA: Insufficient documentation

## 2016-06-03 DIAGNOSIS — I700 Atherosclerosis of aorta: Secondary | ICD-10-CM | POA: Diagnosis not present

## 2016-06-03 LAB — CBC
HEMATOCRIT: 38.8 % — AB (ref 39.0–52.0)
Hemoglobin: 13.1 g/dL (ref 13.0–17.0)
MCH: 31.6 pg (ref 26.0–34.0)
MCHC: 33.8 g/dL (ref 30.0–36.0)
MCV: 93.5 fL (ref 78.0–100.0)
PLATELETS: 211 10*3/uL (ref 150–400)
RBC: 4.15 MIL/uL — ABNORMAL LOW (ref 4.22–5.81)
RDW: 13.5 % (ref 11.5–15.5)
WBC: 6.9 10*3/uL (ref 4.0–10.5)

## 2016-06-03 LAB — SURGICAL PCR SCREEN
MRSA, PCR: NEGATIVE
STAPHYLOCOCCUS AUREUS: NEGATIVE

## 2016-06-03 LAB — GLUCOSE, CAPILLARY: GLUCOSE-CAPILLARY: 244 mg/dL — AB (ref 65–99)

## 2016-06-03 NOTE — Progress Notes (Signed)
CBG and HgA1C routed via epic to Dr Susa Day

## 2016-06-08 ENCOUNTER — Ambulatory Visit (HOSPITAL_COMMUNITY): Payer: Medicare HMO | Admitting: Anesthesiology

## 2016-06-08 ENCOUNTER — Ambulatory Visit (HOSPITAL_COMMUNITY)
Admission: RE | Admit: 2016-06-08 | Discharge: 2016-06-09 | Disposition: A | Discharge status: Home / Self Care | Payer: Medicare HMO | Source: Ambulatory Visit | Attending: Specialist | Admitting: Specialist

## 2016-06-08 ENCOUNTER — Encounter (HOSPITAL_COMMUNITY)
Admission: RE | Disposition: A | Discharge status: Home / Self Care | Payer: Self-pay | Source: Ambulatory Visit | Attending: Specialist

## 2016-06-08 ENCOUNTER — Ambulatory Visit (HOSPITAL_COMMUNITY): Payer: Medicare HMO

## 2016-06-08 ENCOUNTER — Encounter (HOSPITAL_COMMUNITY): Payer: Self-pay | Admitting: *Deleted

## 2016-06-08 DIAGNOSIS — Z87891 Personal history of nicotine dependence: Secondary | ICD-10-CM | POA: Diagnosis not present

## 2016-06-08 DIAGNOSIS — R262 Difficulty in walking, not elsewhere classified: Secondary | ICD-10-CM | POA: Insufficient documentation

## 2016-06-08 DIAGNOSIS — M4807 Spinal stenosis, lumbosacral region: Secondary | ICD-10-CM | POA: Insufficient documentation

## 2016-06-08 DIAGNOSIS — E1151 Type 2 diabetes mellitus with diabetic peripheral angiopathy without gangrene: Secondary | ICD-10-CM | POA: Diagnosis not present

## 2016-06-08 DIAGNOSIS — N400 Enlarged prostate without lower urinary tract symptoms: Secondary | ICD-10-CM | POA: Insufficient documentation

## 2016-06-08 DIAGNOSIS — M48061 Spinal stenosis, lumbar region without neurogenic claudication: Secondary | ICD-10-CM | POA: Insufficient documentation

## 2016-06-08 DIAGNOSIS — Z8673 Personal history of transient ischemic attack (TIA), and cerebral infarction without residual deficits: Secondary | ICD-10-CM | POA: Insufficient documentation

## 2016-06-08 DIAGNOSIS — Z419 Encounter for procedure for purposes other than remedying health state, unspecified: Secondary | ICD-10-CM

## 2016-06-08 DIAGNOSIS — I714 Abdominal aortic aneurysm, without rupture: Secondary | ICD-10-CM | POA: Diagnosis not present

## 2016-06-08 DIAGNOSIS — M48062 Spinal stenosis, lumbar region with neurogenic claudication: Secondary | ICD-10-CM | POA: Diagnosis not present

## 2016-06-08 DIAGNOSIS — I10 Essential (primary) hypertension: Secondary | ICD-10-CM | POA: Insufficient documentation

## 2016-06-08 DIAGNOSIS — Z96653 Presence of artificial knee joint, bilateral: Secondary | ICD-10-CM | POA: Insufficient documentation

## 2016-06-08 DIAGNOSIS — I739 Peripheral vascular disease, unspecified: Secondary | ICD-10-CM | POA: Diagnosis not present

## 2016-06-08 DIAGNOSIS — E785 Hyperlipidemia, unspecified: Secondary | ICD-10-CM | POA: Insufficient documentation

## 2016-06-08 DIAGNOSIS — M5136 Other intervertebral disc degeneration, lumbar region: Secondary | ICD-10-CM | POA: Diagnosis not present

## 2016-06-08 DIAGNOSIS — M1990 Unspecified osteoarthritis, unspecified site: Secondary | ICD-10-CM | POA: Diagnosis not present

## 2016-06-08 HISTORY — DX: Spinal stenosis, lumbar region without neurogenic claudication: M48.061

## 2016-06-08 HISTORY — PX: LUMBAR LAMINECTOMY/DECOMPRESSION MICRODISCECTOMY: SHX5026

## 2016-06-08 LAB — BASIC METABOLIC PANEL
ANION GAP: 9 (ref 5–15)
BUN: 18 mg/dL (ref 6–20)
CHLORIDE: 101 mmol/L (ref 101–111)
CO2: 27 mmol/L (ref 22–32)
Calcium: 8.8 mg/dL — ABNORMAL LOW (ref 8.9–10.3)
Creatinine, Ser: 0.95 mg/dL (ref 0.61–1.24)
GFR calc non Af Amer: >60 mL/min (ref >60)
GLUCOSE: 192 mg/dL — AB (ref 65–99)
POTASSIUM: 3.7 mmol/L (ref 3.5–5.1)
Sodium: 137 mmol/L (ref 135–145)

## 2016-06-08 LAB — GLUCOSE, CAPILLARY
GLUCOSE-CAPILLARY: 181 mg/dL — AB (ref 65–99)
GLUCOSE-CAPILLARY: 261 mg/dL — AB (ref 65–99)
Glucose-Capillary: 139 mg/dL — ABNORMAL HIGH (ref 65–99)

## 2016-06-08 SURGERY — LUMBAR LAMINECTOMY/DECOMPRESSION MICRODISCECTOMY 2 LEVELS
Anesthesia: General | Site: Back | Laterality: Bilateral

## 2016-06-08 MED ORDER — CARVEDILOL 6.25 MG PO TABS
6.2500 mg | ORAL_TABLET | Freq: Two times a day (BID) | ORAL | Status: DC
  Administered 2016-06-08 – 2016-06-09 (×2): 6.25 mg via ORAL
  Filled 2016-06-08 (×2): qty 1

## 2016-06-08 MED ORDER — ONDANSETRON HCL 4 MG/2ML IJ SOLN
INTRAMUSCULAR | Status: DC | PRN
  Administered 2016-06-08: 4 mg via INTRAVENOUS

## 2016-06-08 MED ORDER — DEXAMETHASONE SODIUM PHOSPHATE 4 MG/ML IJ SOLN
INTRAMUSCULAR | Status: DC | PRN
  Administered 2016-06-08: 10 mg via INTRAVENOUS

## 2016-06-08 MED ORDER — PHENOL 1.4 % MT LIQD
1.0000 | OROMUCOSAL | Status: DC | PRN
Start: 2016-06-08 — End: 2016-06-09

## 2016-06-08 MED ORDER — SUGAMMADEX SODIUM 200 MG/2ML IV SOLN
INTRAVENOUS | Status: DC | PRN
  Administered 2016-06-08: 500 mg via INTRAVENOUS

## 2016-06-08 MED ORDER — MIDAZOLAM HCL 2 MG/2ML IJ SOLN
INTRAMUSCULAR | Status: AC
  Filled 2016-06-08: qty 2

## 2016-06-08 MED ORDER — LIDOCAINE 2% (20 MG/ML) 5 ML SYRINGE
INTRAMUSCULAR | Status: DC | PRN
  Administered 2016-06-08: 80 mg via INTRAVENOUS

## 2016-06-08 MED ORDER — ONDANSETRON HCL 4 MG PO TABS: 4.0000 mg | ORAL_TABLET | Freq: Four times a day (QID) | ORAL | Status: DC | PRN

## 2016-06-08 MED ORDER — METHOCARBAMOL 1000 MG/10ML IJ SOLN
500.0000 mg | Freq: Four times a day (QID) | INTRAVENOUS | Status: DC | PRN
  Administered 2016-06-08: 500 mg via INTRAVENOUS
  Filled 2016-06-08: qty 550

## 2016-06-08 MED ORDER — FENTANYL CITRATE (PF) 100 MCG/2ML IJ SOLN
INTRAMUSCULAR | Status: DC | PRN
  Administered 2016-06-08 (×2): 50 ug via INTRAVENOUS
  Administered 2016-06-08: 100 ug via INTRAVENOUS
  Administered 2016-06-08: 50 ug via INTRAVENOUS

## 2016-06-08 MED ORDER — BUPIVACAINE-EPINEPHRINE (PF) 0.5% -1:200000 IJ SOLN
INTRAMUSCULAR | Status: DC | PRN
  Administered 2016-06-08: 10 mL

## 2016-06-08 MED ORDER — DEXAMETHASONE SODIUM PHOSPHATE 10 MG/ML IJ SOLN
INTRAMUSCULAR | Status: AC
  Filled 2016-06-08: qty 1

## 2016-06-08 MED ORDER — THROMBIN 5000 UNITS EX SOLR
CUTANEOUS | Status: AC
  Filled 2016-06-08: qty 10000

## 2016-06-08 MED ORDER — PROPOFOL 10 MG/ML IV BOLUS
INTRAVENOUS | Status: DC | PRN
  Administered 2016-06-08: 120 mg via INTRAVENOUS

## 2016-06-08 MED ORDER — SUGAMMADEX SODIUM 500 MG/5ML IV SOLN
INTRAVENOUS | Status: AC
  Filled 2016-06-08: qty 5

## 2016-06-08 MED ORDER — LACTATED RINGERS IV SOLN
INTRAVENOUS | Status: DC
  Administered 2016-06-08 (×2): via INTRAVENOUS

## 2016-06-08 MED ORDER — CEFAZOLIN SODIUM-DEXTROSE 2-4 GM/100ML-% IV SOLN
INTRAVENOUS | Status: AC
  Filled 2016-06-08: qty 100

## 2016-06-08 MED ORDER — TRAMADOL HCL 50 MG PO TABS: 50.0000 mg | ORAL_TABLET | Freq: Three times a day (TID) | ORAL | Status: DC | PRN

## 2016-06-08 MED ORDER — ALUM & MAG HYDROXIDE-SIMETH 200-200-20 MG/5ML PO SUSP: 30.0000 mL | Freq: Four times a day (QID) | ORAL | Status: DC | PRN

## 2016-06-08 MED ORDER — BUPIVACAINE-EPINEPHRINE (PF) 0.5% -1:200000 IJ SOLN
INTRAMUSCULAR | Status: AC
  Filled 2016-06-08: qty 30

## 2016-06-08 MED ORDER — ROCURONIUM BROMIDE 50 MG/5ML IV SOSY
PREFILLED_SYRINGE | INTRAVENOUS | Status: DC | PRN
  Administered 2016-06-08 (×2): 10 mg via INTRAVENOUS
  Administered 2016-06-08: 50 mg via INTRAVENOUS

## 2016-06-08 MED ORDER — EPHEDRINE SULFATE-NACL 50-0.9 MG/10ML-% IV SOSY
PREFILLED_SYRINGE | INTRAVENOUS | Status: DC | PRN
  Administered 2016-06-08: 10 mg via INTRAVENOUS
  Administered 2016-06-08 (×2): 5 mg via INTRAVENOUS
  Administered 2016-06-08: 10 mg via INTRAVENOUS

## 2016-06-08 MED ORDER — ACETAMINOPHEN 650 MG RE SUPP: 650.0000 mg | RECTAL | Status: DC | PRN

## 2016-06-08 MED ORDER — CEFAZOLIN SODIUM-DEXTROSE 2-4 GM/100ML-% IV SOLN
2.0000 g | INTRAVENOUS | Status: AC
  Administered 2016-06-08: 2 g via INTRAVENOUS

## 2016-06-08 MED ORDER — MIDAZOLAM HCL 5 MG/5ML IJ SOLN
INTRAMUSCULAR | Status: DC | PRN
  Administered 2016-06-08: 1 mg via INTRAVENOUS

## 2016-06-08 MED ORDER — EPHEDRINE 5 MG/ML INJ
INTRAVENOUS | Status: AC
  Filled 2016-06-08: qty 10

## 2016-06-08 MED ORDER — ROCURONIUM BROMIDE 50 MG/5ML IV SOSY
PREFILLED_SYRINGE | INTRAVENOUS | Status: AC
  Filled 2016-06-08: qty 5

## 2016-06-08 MED ORDER — DOCUSATE SODIUM 100 MG PO CAPS: 100.0000 mg | ORAL_CAPSULE | Freq: Two times a day (BID) | ORAL | 1 refills | Status: DC | PRN

## 2016-06-08 MED ORDER — PHENYLEPHRINE 40 MCG/ML (10ML) SYRINGE FOR IV PUSH (FOR BLOOD PRESSURE SUPPORT)
PREFILLED_SYRINGE | INTRAVENOUS | Status: AC
  Filled 2016-06-08: qty 10

## 2016-06-08 MED ORDER — SODIUM CHLORIDE 0.9 % IR SOLN
Status: AC
  Filled 2016-06-08: qty 500000

## 2016-06-08 MED ORDER — FENTANYL CITRATE (PF) 250 MCG/5ML IJ SOLN
INTRAMUSCULAR | Status: AC
  Filled 2016-06-08: qty 5

## 2016-06-08 MED ORDER — CHLORHEXIDINE GLUCONATE 4 % EX LIQD: 60.0000 mL | Freq: Once | CUTANEOUS | Status: DC

## 2016-06-08 MED ORDER — POLYMYXIN B SULFATE 500000 UNITS IJ SOLR
INTRAMUSCULAR | Status: DC | PRN
  Administered 2016-06-08: 500 mL

## 2016-06-08 MED ORDER — HYDROCHLOROTHIAZIDE 25 MG PO TABS
25.0000 mg | ORAL_TABLET | Freq: Every day | ORAL | Status: DC
  Administered 2016-06-09: 25 mg via ORAL
  Filled 2016-06-08: qty 1

## 2016-06-08 MED ORDER — HYDROMORPHONE HCL 1 MG/ML IJ SOLN: 0.2500 mg | INTRAMUSCULAR | Status: DC | PRN

## 2016-06-08 MED ORDER — ACETAMINOPHEN 500 MG PO TABS: 1000.0000 mg | ORAL_TABLET | Freq: Four times a day (QID) | ORAL | Status: DC | PRN

## 2016-06-08 MED ORDER — LIDOCAINE 2% (20 MG/ML) 5 ML SYRINGE
INTRAMUSCULAR | Status: AC
  Filled 2016-06-08: qty 5

## 2016-06-08 MED ORDER — MAGNESIUM CITRATE PO SOLN: 1.0000 | Freq: Once | ORAL | Status: DC | PRN

## 2016-06-08 MED ORDER — GELATIN ABSORBABLE MT POWD
OROMUCOSAL | Status: DC | PRN
  Administered 2016-06-08: 5 mL via TOPICAL

## 2016-06-08 MED ORDER — POLYETHYLENE GLYCOL 3350 17 G PO PACK: 17.0000 g | PACK | Freq: Every day | ORAL | Status: DC | PRN

## 2016-06-08 MED ORDER — ONDANSETRON HCL 4 MG/2ML IJ SOLN: 4.0000 mg | Freq: Four times a day (QID) | INTRAMUSCULAR | Status: DC | PRN

## 2016-06-08 MED ORDER — INSULIN ASPART 100 UNIT/ML ~~LOC~~ SOLN
0.0000 [IU] | Freq: Three times a day (TID) | SUBCUTANEOUS | Status: DC
  Administered 2016-06-09: 5 [IU] via SUBCUTANEOUS
  Administered 2016-06-09: 3 [IU] via SUBCUTANEOUS

## 2016-06-08 MED ORDER — METOCLOPRAMIDE HCL 5 MG/ML IJ SOLN
INTRAMUSCULAR | Status: AC
  Filled 2016-06-08: qty 2

## 2016-06-08 MED ORDER — BISACODYL 5 MG PO TBEC: 5.0000 mg | DELAYED_RELEASE_TABLET | Freq: Every day | ORAL | Status: DC | PRN

## 2016-06-08 MED ORDER — ACETAMINOPHEN 10 MG/ML IV SOLN
INTRAVENOUS | Status: AC
  Filled 2016-06-08: qty 100

## 2016-06-08 MED ORDER — THROMBIN 5000 UNITS EX SOLR
OROMUCOSAL | Status: DC | PRN
  Administered 2016-06-08: 5 mL via TOPICAL

## 2016-06-08 MED ORDER — ONDANSETRON HCL 4 MG/2ML IJ SOLN
INTRAMUSCULAR | Status: AC
  Filled 2016-06-08: qty 2

## 2016-06-08 MED ORDER — RISAQUAD PO CAPS
1.0000 | ORAL_CAPSULE | Freq: Every day | ORAL | Status: DC
  Administered 2016-06-08 – 2016-06-09 (×2): 1 via ORAL
  Filled 2016-06-08 (×2): qty 1

## 2016-06-08 MED ORDER — POLYETHYLENE GLYCOL 3350 17 G PO PACK: 17.0000 g | PACK | Freq: Every day | ORAL | 0 refills | Status: DC

## 2016-06-08 MED ORDER — HYDROCODONE-ACETAMINOPHEN 5-325 MG PO TABS: 1.0000 | ORAL_TABLET | Freq: Four times a day (QID) | ORAL | 0 refills | Status: DC | PRN

## 2016-06-08 MED ORDER — CEFAZOLIN SODIUM-DEXTROSE 2-4 GM/100ML-% IV SOLN
2.0000 g | Freq: Three times a day (TID) | INTRAVENOUS | Status: AC
  Administered 2016-06-08 – 2016-06-09 (×2): 2 g via INTRAVENOUS
  Filled 2016-06-08 (×2): qty 100

## 2016-06-08 MED ORDER — METHOCARBAMOL 500 MG PO TABS: 500.0000 mg | ORAL_TABLET | Freq: Four times a day (QID) | ORAL | Status: DC | PRN

## 2016-06-08 MED ORDER — POTASSIUM CHLORIDE IN NACL 20-0.9 MEQ/L-% IV SOLN
INTRAVENOUS | Status: AC
  Administered 2016-06-08: 18:00:00 via INTRAVENOUS
  Filled 2016-06-08: qty 1000

## 2016-06-08 MED ORDER — TAMSULOSIN HCL 0.4 MG PO CAPS
0.4000 mg | ORAL_CAPSULE | Freq: Every day | ORAL | Status: DC
  Administered 2016-06-09: 0.4 mg via ORAL
  Filled 2016-06-08: qty 1

## 2016-06-08 MED ORDER — DOCUSATE SODIUM 100 MG PO CAPS
100.0000 mg | ORAL_CAPSULE | Freq: Two times a day (BID) | ORAL | Status: DC
  Administered 2016-06-08 – 2016-06-09 (×2): 100 mg via ORAL
  Filled 2016-06-08 (×2): qty 1

## 2016-06-08 MED ORDER — HYDROCODONE-ACETAMINOPHEN 5-325 MG PO TABS
1.0000 | ORAL_TABLET | ORAL | Status: DC | PRN
  Administered 2016-06-08 – 2016-06-09 (×3): 2 via ORAL
  Filled 2016-06-08 (×3): qty 2

## 2016-06-08 MED ORDER — PHENYLEPHRINE 40 MCG/ML (10ML) SYRINGE FOR IV PUSH (FOR BLOOD PRESSURE SUPPORT)
PREFILLED_SYRINGE | INTRAVENOUS | Status: DC | PRN
  Administered 2016-06-08: 120 ug via INTRAVENOUS
  Administered 2016-06-08 (×4): 80 ug via INTRAVENOUS
  Administered 2016-06-08: 40 ug via INTRAVENOUS

## 2016-06-08 MED ORDER — ACETAMINOPHEN 10 MG/ML IV SOLN
1000.0000 mg | INTRAVENOUS | Status: AC
  Administered 2016-06-08: 1000 mg via INTRAVENOUS

## 2016-06-08 MED ORDER — MENTHOL 3 MG MT LOZG
1.0000 | LOZENGE | OROMUCOSAL | Status: DC | PRN
Start: 2016-06-08 — End: 2016-06-09

## 2016-06-08 MED ORDER — ACETAMINOPHEN 325 MG PO TABS: 650.0000 mg | ORAL_TABLET | ORAL | Status: DC | PRN

## 2016-06-08 MED ORDER — PROMETHAZINE HCL 25 MG/ML IJ SOLN
6.2500 mg | INTRAMUSCULAR | Status: DC | PRN
Start: 2016-06-08 — End: 2016-06-08

## 2016-06-08 MED ORDER — PROPOFOL 10 MG/ML IV BOLUS
INTRAVENOUS | Status: AC
  Filled 2016-06-08: qty 20

## 2016-06-08 SURGICAL SUPPLY — 52 items
BAG SPEC THK2 15X12 ZIP CLS (MISCELLANEOUS) ×1
BAG ZIPLOCK 12X15 (MISCELLANEOUS) ×2 IMPLANT
CLEANER TIP ELECTROSURG 2X2 (MISCELLANEOUS) ×4 IMPLANT
CLOTH 2% CHLOROHEXIDINE 3PK (PERSONAL CARE ITEMS) ×2 IMPLANT
CORDS BIPOLAR (ELECTRODE) ×2 IMPLANT
COVER SURGICAL LIGHT HANDLE (MISCELLANEOUS) ×2 IMPLANT
DRAPE MICROSCOPE LEICA (MISCELLANEOUS) ×2 IMPLANT
DRAPE SHEET LG 3/4 BI-LAMINATE (DRAPES) IMPLANT
DRAPE SURG 17X11 SM STRL (DRAPES) ×2 IMPLANT
DRAPE UTILITY XL STRL (DRAPES) ×2 IMPLANT
DRSG AQUACEL AG ADV 3.5X 4 (GAUZE/BANDAGES/DRESSINGS) ×2 IMPLANT
DRSG AQUACEL AG ADV 3.5X 6 (GAUZE/BANDAGES/DRESSINGS) IMPLANT
DURAPREP 26ML APPLICATOR (WOUND CARE) ×2 IMPLANT
DURASEAL SPINE SEALANT 3ML (MISCELLANEOUS) IMPLANT
ELECT BLADE TIP CTD 4 INCH (ELECTRODE) IMPLANT
ELECT REM PT RETURN 15FT ADLT (MISCELLANEOUS) ×2 IMPLANT
GLOVE BIOGEL M STRL SZ7.5 (GLOVE) ×4 IMPLANT
GLOVE BIOGEL PI IND STRL 7.0 (GLOVE) ×1 IMPLANT
GLOVE BIOGEL PI IND STRL 7.5 (GLOVE) ×4 IMPLANT
GLOVE BIOGEL PI INDICATOR 7.0 (GLOVE) ×1
GLOVE BIOGEL PI INDICATOR 7.5 (GLOVE) ×4
GLOVE SURG SS PI 7.0 STRL IVOR (GLOVE) ×2 IMPLANT
GLOVE SURG SS PI 8.0 STRL IVOR (GLOVE) ×4 IMPLANT
GOWN STRL REUS W/TWL XL LVL3 (GOWN DISPOSABLE) ×6 IMPLANT
IV CATH 14GX2 1/4 (CATHETERS) IMPLANT
KIT BASIN OR (CUSTOM PROCEDURE TRAY) ×2 IMPLANT
KIT POSITIONING SURG ANDREWS (MISCELLANEOUS) ×2 IMPLANT
MANIFOLD NEPTUNE II (INSTRUMENTS) ×2 IMPLANT
MARKER SKIN DUAL TIP RULER LAB (MISCELLANEOUS) ×2 IMPLANT
NEEDLE SPNL 18GX3.5 QUINCKE PK (NEEDLE) ×4 IMPLANT
PACK LAMINECTOMY ORTHO (CUSTOM PROCEDURE TRAY) ×2 IMPLANT
PAD TELFA 2X3 NADH STRL (GAUZE/BANDAGES/DRESSINGS) ×2 IMPLANT
PATTIES SURGICAL .5 X.5 (GAUZE/BANDAGES/DRESSINGS) ×2 IMPLANT
PATTIES SURGICAL .75X.75 (GAUZE/BANDAGES/DRESSINGS) ×2 IMPLANT
PATTIES SURGICAL 1X1 (DISPOSABLE) IMPLANT
RUBBERBAND STERILE (MISCELLANEOUS) ×2 IMPLANT
SPONGE LAP 4X18 X RAY DECT (DISPOSABLE) IMPLANT
SPONGE SURGIFOAM ABS GEL 100 (HEMOSTASIS) ×2 IMPLANT
STAPLER VISISTAT (STAPLE) ×2 IMPLANT
STRIP CLOSURE SKIN 1/2X4 (GAUZE/BANDAGES/DRESSINGS) IMPLANT
SUT NURALON 4 0 TR CR/8 (SUTURE) IMPLANT
SUT PROLENE 3 0 PS 2 (SUTURE) IMPLANT
SUT VIC AB 1 CT1 27 (SUTURE) ×1
SUT VIC AB 1 CT1 27XBRD ANTBC (SUTURE) ×1 IMPLANT
SUT VIC AB 1-0 CT2 27 (SUTURE) ×2 IMPLANT
SUT VIC AB 2-0 CT1 27 (SUTURE)
SUT VIC AB 2-0 CT1 TAPERPNT 27 (SUTURE) IMPLANT
SUT VIC AB 2-0 CT2 27 (SUTURE) ×2 IMPLANT
SYR 3ML LL SCALE MARK (SYRINGE) ×2 IMPLANT
TOWEL OR 17X26 10 PK STRL BLUE (TOWEL DISPOSABLE) ×2 IMPLANT
TOWEL OR NON WOVEN STRL DISP B (DISPOSABLE) ×2 IMPLANT
YANKAUER SUCT BULB TIP NO VENT (SUCTIONS) ×2 IMPLANT

## 2016-06-08 NOTE — Anesthesia Postprocedure Evaluation (Signed)
Anesthesia Post Note  Patient: [name]  Procedure(s) Performed: Procedure(s) (LRB): Bilateral Microlumbar decompression L4-L5 and L5-S1 (Bilateral)  Patient location during evaluation: PACU Anesthesia Type: General Level of consciousness: awake and alert Pain management: pain level controlled Vital Signs Assessment: post-procedure vital signs reviewed and stable Respiratory status: spontaneous breathing, nonlabored ventilation, respiratory function stable and patient connected to nasal cannula oxygen Cardiovascular status: blood pressure returned to baseline and stable Postop Assessment: no signs of nausea or vomiting Anesthetic complications: no       Last Vitals:  Vitals:   06/08/16 1130 06/08/16 1145  BP: 128/77 135/86  Pulse: 61 (!) 59  Resp: 13 15  Temp:  36.4 C    Last Pain:  Vitals:   06/08/16 1145  TempSrc:   PainSc: 0-No pain                 Mishaal Lansdale S

## 2016-06-08 NOTE — H&P (View-Only) (Signed)
[name] is an 82 y.o. male.   Chief Complaint: back and leg pain HPI: The patient is a 82 year old male who presents today for follow up of their back. The patient is being followed for their back pain. They are now 4 month(s) out from when symptoms began. Symptoms reported today include: pain. Current treatment includes: activity modification, NSAIDs (Naprosyn) and pain medications. The following medication has been used for pain control: antiinflammatory medication and Ultram. The patient reports their current pain level to be severe and 4 / 10.  [name] follows up with his wife. He is recovering from surgical decompression and has leg pain. She is here with her husband and mainly has pain down into the right. We discussed previously a decompression at L5-S1. Discussed with the radiologist and had the axial image re-configured that does increase the stenosis at 5-1 and radiates into the buttock and down into the leg. He does have some back pain, but predominantly leg pain.   Past Medical History:  Diagnosis Date  . AAA (abdominal aortic aneurysm) ([place])    MONITORED BY DR LAWSON LOV JUNE 2014--  STABLE  AT 3.7  . Allergic rhinitis   . Arthritis   . Asymptomatic stenosis of left carotid artery without infarction    MILD DISEASE  . BPH (benign prostatic hypertrophy)   . Diverticulosis of colon   . History of cardiomyopathy    IDIOPATHIC-- PER [place] NOTE 2013 , CURRENTLY RESOLVED  . History of kidney stones   . Hyperlipidemia   . Hypertension   . Peripheral vascular disease ([place])   . Phimosis   . Right carotid artery occlusion    CHRONIC   WITHOUT INFARTION  . Stroke [facility])     Right eye  . Type 2 diabetes mellitus ([place])   . Wears hearing aid     Past Surgical History:  Procedure Laterality Date  . CARDIOVASCULAR STRESS TEST  JUNE 2008  DR Wynonia Lawman  . CATARACT EXTRACTION W/ INTRAOCULAR LENS IMPLANT Right   . CIRCUMCISION N/A 08/30/2012   Procedure: CIRCUMCISION ADULT;   Surgeon: Malka So, MD;  Location[facility];  Service: Urology;  Laterality: N/A;  . CYSTO/  RIGHT URETERAL STENT PLACEMENT  10-16-2003  . EXTRACORPOREAL SHOCK WAVE LITHOTRIPSY Right 2005  . EYE SURGERY    . JOINT REPLACEMENT Bilateral 2008  2009   Knee  . TOTAL KNEE ARTHROPLASTY Bilateral RIGHT 10-08-2007;   LEFT 09-30-2008  . TRANSTHORACIC ECHOCARDIOGRAM  10-21-2009   LVEF 50%    Family History  Problem Relation Age of Onset  . Heart disease Mother     CHF  Before age 80  . COPD Mother   . Kidney disease Mother   . Hypertension Mother   . Asthma Mother   . Depression Mother   . ALS Father   . Diabetes Father   . Colon cancer Neg Hx    Social History:  reports that he quit smoking about 33 years ago. His smoking use included Cigarettes. He has a 18.75 pack-year smoking history. He has never used smokeless tobacco. He reports that he does not drink alcohol or use drugs.  Allergies: No Known Allergies   (Not in a hospital admission)  No results found for this or any previous visit (from the past 48 hour(s)). No results found.  Review of Systems  Constitutional: Negative.   HENT: Negative.   Eyes: Negative.   Respiratory: Negative.   Cardiovascular: Negative.  Gastrointestinal: Negative.   Genitourinary: Negative.   Musculoskeletal: Positive for back pain.  Skin: Negative.   Neurological: Positive for sensory change and focal weakness.  Psychiatric/Behavioral: Negative.     There were no vitals taken for this visit. Physical Exam  Constitutional: He is oriented to person, place, and time. He appears well-developed. He appears distressed.  HENT:  Head: Normocephalic.  Eyes: Pupils are equal, round, and reactive to light.  Neck: Normal range of motion.  Cardiovascular: Normal rate.   Respiratory: Effort normal.  GI: Soft.  Musculoskeletal:  On exam, he is in moderate distress. Mood and affect is appropriate. He has pain with extension,  relieved with forward flexion. Straight leg raise, buttock, thigh and calf pain on the right, negative on the left. EHL is 4+/5 on the right compared to the left, pain with flexion and extension.  Lumbar spine exam reveals no evidence of soft tissue swelling, deformity or skin ecchymosis. On palpation there is no tenderness of the lumbar spine. No flank pain with percussion. The abdomen is soft and nontender. Nontender over the trochanters. No cellulitis or lymphadenopathy.  Good range of motion of the lumbar spine without associated pain. Motor is 5/5 including tibialis anterior, plantar flexion, quadriceps and hamstrings. Patient is normoreflexic. There is no Babinski or clonus. Sensory exam is intact to light touch. Patient has good distal pulses. No DVT. No pain and normal range of motion without instability of the hips, knees and ankles.  He has painless range of motion of the cervical spine.  Neurological: He is alert and oriented to person, place, and time.    MRI was reviewed with he and his wife demonstrating multifactorial severe stenosis at 5-1. He does have multilevel disk degeneration.  Assessment/Plan 1. Neurogenic claudication secondary to spinal stenosis, right lower extremity radicular pain secondary to lateral recess stenosis, refractory 2. Multilevel disk degeneration.   Recommendations and discussion: We had an extensive discussion concerning current pathology, relevant anatomy and treatment options. We discussed either living with his symptoms, he only had temporary relief from corticosteroid injection by Dr. Nelva Bush. Discussed other options including decompression at 5-1. We suggest having a laminectomy to decompress to above the pedicle of L5 which would extend up into the L4-5 interspace.  I had an extensive discussion of the risks and benefits of the lumbar decompression with the patient including bleeding, infection, damage to neurovascular structures, epidural fibrosis, CSF  leak requiring repair. We also discussed increase in pain, adjacent segment disease, recurrent disc herniation, need for future surgery including repeat decompression and/or fusion. We also discussed risks of postoperative hematoma, paralysis, anesthetic complications including DVT, PE, death, cardiopulmonary dysfunction. In addition, the perioperative and postoperative courses were discussed in detail including the rehabilitative time and return to functional activity and work. I provided the patient with an illustrated handout and utilized the appropriate surgical models.  We discussed the postoperative course in detail, overnight in the hospital, sutures out in two weeks at maximum medical improvement in three months. Early ambulation. No history of MRSA or blood clot. He would like to proceed with the procedure. We did discuss; however, and emphasize this was mainly for buttock and leg pain as opposed to back pain which really then handled conservatively. We will have him scheduled for that procedure.  Plan microlumbar decompression L4-5, L5-S1  Cecilie Kicks., PA-C for Dr. Tonita Cong 05/20/2016, 12:32 PM

## 2016-06-08 NOTE — Transfer of Care (Signed)
Immediate Anesthesia Transfer of Care Note  Patient: [name]  Procedure(s) Performed: Procedure(s) with comments: Bilateral Microlumbar decompression L4-L5 and L5-S1 (Bilateral) - Requests 2 hours  Patient Location: PACU  Anesthesia Type:General  Level of Consciousness: awake, alert  and oriented  Airway & Oxygen Therapy: Patient Spontanous Breathing and Patient connected to face mask oxygen  Post-op Assessment: Report given to RN and Post -op Vital signs reviewed and stable  Post vital signs: Reviewed and stable  Last Vitals:  Vitals:   06/08/16 0633  BP: 121/77  Pulse: 65  Resp: 18  Temp: 36.9 C    Last Pain:  Vitals:   06/08/16 0633  TempSrc: Oral      Patients Stated Pain Goal: 4 (49/70/26 3785)  Complications: No apparent anesthesia complications

## 2016-06-08 NOTE — Addendum Note (Signed)
Addendum  created 06/08/16 1311 by Deliah Boston, CRNA   Anesthesia Event edited

## 2016-06-08 NOTE — Discharge Instructions (Signed)
Walk As Tolerated utilizing back precautions.  No bending, twisting, or lifting.  No driving for 2 weeks.   °Aquacel dressing may remain in place until follow up. May shower with aquacel dressing in place. If the dressing peels off or becomes saturated, you may remove aquacel dressing and place gauze and tape dressing which should be kept clean and dry and changed daily. Do not remove steri-strips if they are present. °See Dr. Amabel Stmarie in office in 10 to 14 days. Begin taking aspirin 81mg per day starting 4 days after your surgery if not allergic to aspirin or on another blood thinner. °Walk daily even outside. Use a cane or walker only if necessary. °Avoid sitting on soft sofas. ° °

## 2016-06-08 NOTE — Interval H&P Note (Signed)
History and Physical Interval Note:  06/08/2016 8:25 AM  [name]  has presented today for surgery, with the diagnosis of Stenosis L4-S1  The various methods of treatment have been discussed with the patient and family. After consideration of risks, benefits and other options for treatment, the patient has consented to  Procedure(s) with comments: Microlumbar decompression L4-S1 (N/A) - Requests 2 hours as a surgical intervention .  The patient's history has been reviewed, patient examined, no change in status, stable for surgery.  I have reviewed the patient's chart and labs.  Questions were answered to the patient's satisfaction.     Vedder Brittian C

## 2016-06-08 NOTE — Interval H&P Note (Signed)
History and Physical Interval Note:  06/08/2016 8:25 AM  [name]  has presented today for surgery, with the diagnosis of Stenosis L4-S1  The various methods of treatment have been discussed with the patient and family. After consideration of risks, benefits and other options for treatment, the patient has consented to  Procedure(s) with comments: Microlumbar decompression L4-S1 (N/A) - Requests 2 hours as a surgical intervention .  The patient's history has been reviewed, patient examined, no change in status, stable for surgery.  I have reviewed the patient's chart and labs.  Questions were answered to the patient's satisfaction.     Gaye Scorza C

## 2016-06-08 NOTE — Brief Op Note (Signed)
06/08/2016  10:30 AM  PATIENT:  [name]  82 y.o. male  PRE-OPERATIVE DIAGNOSIS:  Stenosis L4-S1  POST-OPERATIVE DIAGNOSIS:  Stenosis L4-S1  PROCEDURE:  Procedure(s) with comments: Bilateral Microlumbar decompression L4-L5 and L5-S1 (Bilateral) - Requests 2 hours  SURGEON:  Surgeon(s) and Role:    * Susa Day, MD - Primary  PHYSICIAN ASSISTANT:   ASSISTANTS: [place]   ANESTHESIA:   general  EBL:  Total I/O In: 1000 [I.V.:1000] Out: 90 [Urine:90]  BLOOD ADMINISTERED:none  DRAINS: none   LOCAL MEDICATIONS USED:  MARCAINE     SPECIMEN:  No Specimen  DISPOSITION OF SPECIMEN:  N/A  COUNTS:  YES  TOURNIQUET:  * No tourniquets in log *  DICTATION: .Other Dictation: Dictation Number [id number]  PLAN OF CARE: Admit for overnight observation  PATIENT DISPOSITION:  PACU - hemodynamically stable.   Delay start of Pharmacological VTE agent (>24hrs) due to surgical blood loss or risk of bleeding: yes

## 2016-06-08 NOTE — Anesthesia Procedure Notes (Signed)
Procedure Name: Intubation Date/Time: 06/08/2016 8:45 AM Performed by: Deliah Boston Pre-anesthesia Checklist: Patient identified, Emergency Drugs available, Suction available and Patient being monitored Patient Re-evaluated:Patient Re-evaluated prior to inductionOxygen Delivery Method: Circle system utilized Preoxygenation: Pre-oxygenation with 100% oxygen Intubation Type: IV induction Ventilation: Mask ventilation without difficulty Laryngoscope Size: Mac and 4 Grade View: Grade I Tube type: Oral Number of attempts: 1 Airway Equipment and Method: Stylet and Oral airway Placement Confirmation: ETT inserted through vocal cords under direct vision,  positive ETCO2 and breath sounds checked- equal and bilateral Secured at: 21 (at lip) cm Tube secured with: Tape Dental Injury: Teeth and Oropharynx as per pre-operative assessment

## 2016-06-08 NOTE — Anesthesia Preprocedure Evaluation (Signed)
Anesthesia Evaluation  Patient identified by MRN, date of birth, ID band Patient awake    Reviewed: Allergy & Precautions, NPO status , Patient's Chart, lab work & pertinent test results  Airway Mallampati: II  TM Distance: >3 FB Neck ROM: Full    Dental no notable dental hx.    Pulmonary neg pulmonary ROS, former smoker,    Pulmonary exam normal breath sounds clear to auscultation       Cardiovascular hypertension, + Peripheral Vascular Disease  Normal cardiovascular exam Rhythm:Regular Rate:Normal     Neuro/Psych negative neurological ROS  negative psych ROS   GI/Hepatic negative GI ROS, Neg liver ROS,   Endo/Other  diabetes  Renal/GU negative Renal ROS  negative genitourinary   Musculoskeletal negative musculoskeletal ROS (+)   Abdominal   Peds negative pediatric ROS (+)  Hematology negative hematology ROS (+)   Anesthesia Other Findings   Reproductive/Obstetrics negative OB ROS                             Anesthesia Physical Anesthesia Plan  ASA: III  Anesthesia Plan: General   Post-op Pain Management:    Induction: Intravenous  Airway Management Planned: Oral ETT  Additional Equipment:   Intra-op Plan:   Post-operative Plan: Extubation in OR  Informed Consent: I have reviewed the patients History and Physical, chart, labs and discussed the procedure including the risks, benefits and alternatives for the proposed anesthesia with the patient or authorized representative who has indicated his/her understanding and acceptance.   Dental advisory given  Plan Discussed with: CRNA and Surgeon  Anesthesia Plan Comments:         Anesthesia Quick Evaluation

## 2016-06-09 ENCOUNTER — Encounter (HOSPITAL_COMMUNITY): Payer: Self-pay | Admitting: Specialist

## 2016-06-09 DIAGNOSIS — M48061 Spinal stenosis, lumbar region without neurogenic claudication: Secondary | ICD-10-CM | POA: Diagnosis not present

## 2016-06-09 LAB — COMPREHENSIVE METABOLIC PANEL
ALBUMIN: 3.8 g/dL (ref 3.5–5.0)
ALK PHOS: 117 U/L (ref 38–126)
ALT: 21 U/L (ref 17–63)
ANION GAP: 8 (ref 5–15)
AST: 23 U/L (ref 15–41)
BUN: 17 mg/dL (ref 6–20)
CALCIUM: 8.6 mg/dL — AB (ref 8.9–10.3)
CO2: 26 mmol/L (ref 22–32)
Chloride: 102 mmol/L (ref 101–111)
Creatinine, Ser: 0.9 mg/dL (ref 0.61–1.24)
GFR calc Af Amer: >60 mL/min (ref >60)
GFR calc non Af Amer: >60 mL/min (ref >60)
GLUCOSE: 235 mg/dL — AB (ref 65–99)
Potassium: 4.1 mmol/L (ref 3.5–5.1)
SODIUM: 136 mmol/L (ref 135–145)
Total Bilirubin: 0.5 mg/dL (ref 0.3–1.2)
Total Protein: 6.6 g/dL (ref 6.5–8.1)

## 2016-06-09 LAB — GLUCOSE, CAPILLARY
Glucose-Capillary: 203 mg/dL — ABNORMAL HIGH (ref 65–99)
Glucose-Capillary: 172 mg/dL — ABNORMAL HIGH (ref 65–99)

## 2016-06-09 NOTE — Progress Notes (Signed)
Discharge planning, spoke with patient at bedside, for d/c home with spouse, no HH needs identified. Has all DME needed. [phone]

## 2016-06-09 NOTE — Progress Notes (Signed)
Pt's vitals are WNL, tolerating diet and pain is under control. Discussed discharge instructions with patient. Discharged to home with prescriptions.  

## 2016-06-09 NOTE — Evaluation (Signed)
Physical Therapy One Time Evaluation Patient Details Name: [name] MRN: [id number] DOB: 1935-01-13 Today's Date: 06/09/2016   History of Present Illness  Pt is a 82 year old male s/p Bilateral Microlumbar decompression L4-L5 and L5-S1  Clinical Impression  Patient evaluated by Physical Therapy with no further acute PT needs identified. All education has been completed and the patient has no further questions.  Pt mobilizing well and reports spouse recently had same type of surgery.  Reviewed back precautions and provided handout.  Pt reports improvement of R LE symptoms postop. See below for any follow-up Physical Therapy or equipment needs. PT is signing off. Thank you for this referral.      Follow Up Recommendations No PT follow up    Equipment Recommendations  None recommended by PT    Recommendations for Other Services       Precautions / Restrictions Precautions Precautions: Back Precaution Comments: provided back handout, educated on back precautions      Mobility  Bed Mobility               General bed mobility comments: pt up in recliner on arrival, verbally reviewed log roll technique with handout for pictures  Transfers Overall transfer level: Needs assistance Equipment used: None Transfers: Sit to/from Stand Sit to Stand: Supervision         General transfer comment: verbal cues for maintaining precautions  Ambulation/Gait Ambulation/Gait assistance: Supervision Ambulation Distance (Feet): 200 Feet Assistive device: None Gait Pattern/deviations: Antalgic;Decreased stance time - right     General Gait Details: mobilizing well, no unsteadiness, slight favoring of R LE likely from presurgical symptoms  Stairs            Wheelchair Mobility    Modified Rankin (Stroke Patients Only)       Balance                                             Pertinent Vitals/Pain Pain Assessment: 0-10 Pain Score: 4  Pain  Location: back Pain Descriptors / Indicators: Sore Pain Intervention(s): Limited activity within patient's tolerance;Monitored during session;Repositioned    Home Living Family/patient expects to be discharged to:: Private residence Living Arrangements: Spouse/significant other   Type of Home: House Home Access: Level entry     Home Layout: One level Home Equipment: None      Prior Function Level of Independence: Independent               Hand Dominance        Extremity/Trunk Assessment        Lower Extremity Assessment Lower Extremity Assessment: RLE deficits/detail RLE Deficits / Details: able to perform active DF against gravity, R LE with presurgical symptoms of numbness/tingling and radiating pain (reports improved post op)       Communication   Communication: HOH (hearing aides)  Cognition Arousal/Alertness: Awake/alert Behavior During Therapy: WFL for tasks assessed/performed Overall Cognitive Status: Within Functional Limits for tasks assessed                                        General Comments      Exercises     Assessment/Plan    PT Assessment Patent does not need any further PT services  PT Problem List  PT Treatment Interventions      PT Goals (Current goals can be found in the Care Plan section)  Acute Rehab PT Goals PT Goal Formulation: All assessment and education complete, DC therapy    Frequency     Barriers to discharge        Co-evaluation               AM-PAC PT "6 Clicks" Daily Activity  Outcome Measure Difficulty turning over in bed (including adjusting bedclothes, sheets and blankets)?: None Difficulty moving from lying on back to sitting on the side of the bed? : A Little Difficulty sitting down on and standing up from a chair with arms (e.g., wheelchair, bedside commode, etc,.)?: A Little Help needed moving to and from a bed to chair (including a wheelchair)?: A Little Help  needed walking in hospital room?: A Little Help needed climbing 3-5 steps with a railing? : A Little 6 Click Score: 19    End of Session   Activity Tolerance: Patient tolerated treatment well Patient left: in chair;with call bell/phone within reach   PT Visit Diagnosis: Difficulty in walking, not elsewhere classified (R26.2)    Time: 9977-4142 PT Time Calculation (min) (ACUTE ONLY): 11 min   Charges:   PT Evaluation $PT Eval Low Complexity: 1 Procedure     PT G Codes:   PT G-Codes **NOT FOR INPATIENT CLASS** Functional Assessment Tool Used: AM-PAC 6 Clicks Basic Mobility;Clinical judgement Functional Limitation: Mobility: Walking and moving around Mobility: Walking and Moving Around Current Status (L9532): At least 1 percent but less than 20 percent impaired, limited or restricted Mobility: Walking and Moving Around Goal Status [phone]): At least 1 percent but less than 20 percent impaired, limited or restricted Mobility: Walking and Moving Around Discharge Status [phone]): At least 1 percent but less than 20 percent impaired, limited or restricted    Carmelia Bake, PT, DPT 06/09/2016 Pager: [phone]   York Ram E 06/09/2016, 10:41 AM

## 2016-06-09 NOTE — Evaluation (Signed)
Occupational Therapy Evaluation Patient Details Name: [name] MRN: [id number] DOB: December 28, 1934 Today's Date: 06/09/2016    History of Present Illness Pt is a 82 year old male s/p Bilateral Microlumbar decompression L4-L5 and L5-S1   Clinical Impression   This 82 y/o M presents with the above. Pt presents below baseline for completing ADLs, needing increased assist mostly for LB dressing and LB bathing secondary to adhering to back precautions. Pt will have assist from daughter and spouse upon return home with ADLs PRN. Educated Pt and daughter on AE, DME, and compensatory techniques for safely completing ADLs upon return home. Questions answered throughout. Pt reports feeling comfortable with completing ADLs upon return home and with spouse assist. No further acute OT needs identified at this time. Will sign off.     Follow Up Recommendations  No OT follow up;Supervision/Assistance - 24 hour    Equipment Recommendations  None recommended by OT (Pt has necessary DME at home )           Precautions / Restrictions Precautions Precautions: Back Precaution Comments: back handout provided by PT; reviewed back precautions Restrictions Weight Bearing Restrictions: No      Mobility Bed Mobility Overal bed mobility: Needs Assistance Bed Mobility: Supine to Sit;Sit to Supine     Supine to sit: Min guard Sit to supine: Min guard   General bed mobility comments: reviewed log rolling technique with Pt requiring Min verbal cues to complete; good follow through with following technique   Transfers Overall transfer level: Needs assistance Equipment used: None Transfers: Sit to/from Stand Sit to Stand: Min guard         General transfer comment: verbal cues for maintaining precautions    Balance Overall balance assessment: No apparent balance deficits (not formally assessed)                                         ADL either performed or assessed with  clinical judgement   ADL Overall ADL's : Needs assistance/impaired Eating/Feeding: Independent;Sitting   Grooming: Oral care;Min guard;Standing;Cueing for compensatory techniques   Upper Body Bathing: Min guard;Sitting   Lower Body Bathing: Minimal assistance;Sit to/from stand;Cueing for safety;Adhering to back precautions   Upper Body Dressing : Min guard;Sitting   Lower Body Dressing: Moderate assistance;Sit to/from stand;Adhering to back precautions   Toilet Transfer: Min guard;Ambulation;BSC Toilet Transfer Details (indicate cue type and reason): BSC over toilet  Toileting- Clothing Manipulation and Hygiene: Min guard;Sit to/from Nurse, children's Details (indicate cue type and reason): Pt plans to utilize walk-in shower and shower seat for completing bathing; educated on safety and compensatory techniques for completing task while adhering to back precautions  Functional mobility during ADLs: Min guard General ADL Comments: Pt completed bed mobility, room level functional mobility, standing grooming ADLs, educated on AE and compensatory techniques for completing ADLs while adhering to back precautions                          Pertinent Vitals/Pain Pain Assessment: Faces Pain Score: 4  Faces Pain Scale: Hurts a little bit Pain Location: back Pain Descriptors / Indicators: Sore Pain Intervention(s): Limited activity within patient's tolerance;Monitored during session;Repositioned          Extremity/Trunk Assessment Upper Extremity Assessment Upper Extremity Assessment: Overall WFL for tasks assessed   Lower Extremity Assessment Lower  Extremity Assessment:  RLE Deficits / Details:        Communication Communication Communication: HOH (has hearing aides )   Cognition Arousal/Alertness: Awake/alert Behavior During Therapy: WFL for tasks assessed/performed Overall Cognitive Status: Within Functional Limits for tasks assessed                                      General Comments                  Home Living Family/patient expects to be discharged to:: Private residence Living Arrangements: Spouse/significant other   Type of Home: House Home Access: Level entry     [place]: One level     Bathroom Shower/Tub: Tub/shower unit;Walk-in shower (no lip to step over, walk-in shower entry flush with ground)   Bathroom Toilet: Standard     Home Equipment: Shower seat;Bedside commode          Prior Functioning/Environment Level of Independence: Independent                 OT Problem List: Decreased activity tolerance;Decreased knowledge of precautions      OT Treatment/Interventions:      OT Goals(Current goals can be found in the care plan section) Acute Rehab OT Goals Patient Stated Goal: regain independence  OT Goal Formulation: With patient                                 AM-PAC PT "6 Clicks" Daily Activity     Outcome Measure Help from another person eating meals?: None Help from another person taking care of personal grooming?: A Little Help from another person toileting, which includes using toliet, bedpan, or urinal?: A Little Help from another person bathing (including washing, rinsing, drying)?: A Little Help from another person to put on and taking off regular upper body clothing?: A Little Help from another person to put on and taking off regular lower body clothing?: A Lot 6 Click Score: 18   End of Session Equipment Utilized During Treatment: Gait belt Nurse Communication: Mobility status  Activity Tolerance: Patient tolerated treatment well Patient left: in chair;with call bell/phone within reach;with family/visitor present  OT Visit Diagnosis: Muscle weakness (generalized) (M62.81)                Time: 7681-1572 OT Time Calculation (min): 26 min Charges:  OT General Charges $OT Visit: 1 Procedure OT Evaluation $OT Eval Low Complexity: 1 Procedure OT  Treatments $Self Care/Home Management : 8-22 mins G-Codes: OT G-codes **NOT FOR INPATIENT CLASS** Functional Assessment Tool Used: AM-PAC 6 Clicks Daily Activity;Clinical judgement Functional Limitation: Self care Self Care Current Status (I2035): At least 20 percent but less than 40 percent impaired, limited or restricted Self Care Goal Status (D9741): At least 20 percent but less than 40 percent impaired, limited or restricted Self Care Discharge Status [phone]): At least 20 percent but less than 40 percent impaired, limited or restricted   Lou Cal, OT Pager [phone] 06/09/2016   Raymondo Band 06/09/2016, 12:28 PM

## 2016-06-09 NOTE — Progress Notes (Signed)
Subjective: 1 Day Post-Op Procedure(s) (LRB): Bilateral Microlumbar decompression L4-L5 and L5-S1 (Bilateral) Patient reports pain as 3 on 0-10 scale.    Objective: Vital signs in last 24 hours: Temp:  [97.5 F (36.4 C)-98.6 F (37 C)] 97.8 F (36.6 C) (05/03 0610) Pulse Rate:  [59-84] 64 (05/03 0610) Resp:  [12-18] 18 (05/03 0610) BP: (120-177)/(65-89) 142/75 (05/03 0610) SpO2:  [96 %-100 %] 99 % (05/03 0610)  Intake/Output from previous day: 05/02 0701 - 05/03 0700 In: 2726.7 [P.O.:480; I.V.:2096.7; IV Piggyback:150] Out: 1400 [Urine:1395; Blood:5] Intake/Output this shift: No intake/output data recorded.  No results for input(s): HGB in the last 72 hours. No results for input(s): WBC, RBC, HCT, PLT in the last 72 hours.  Recent Labs  06/08/16 1209 06/09/16 0506  NA 137 136  K 3.7 4.1  CL 101 102  CO2 27 26  BUN 18 17  CREATININE 0.95 0.90  GLUCOSE 192* 235*  CALCIUM 8.8* 8.6*   No results for input(s): LABPT, INR in the last 72 hours.  Neurologically intact Sensation intact distally Intact pulses distally Dorsiflexion/Plantar flexion intact Incision: dressing C/D/I Compartment soft No dvt Assessment/Plan: 1 Day Post-Op Procedure(s) (LRB): Bilateral Microlumbar decompression L4-L5 and L5-S1 (Bilateral) Advance diet Up with therapy D/C IV fluids Discharge home with home health instructions given.  Zavior Thomason C 06/09/2016, 7:12 AM

## 2016-06-09 NOTE — Op Note (Signed)
NAME:  FAREED, FUNG NO.:  MEDICAL RECORD NO.:  [id number]  LOCATION:                                 FACILITY:  PHYSICIAN:  Susa Day, M.D.         DATE OF BIRTH:  DATE OF PROCEDURE:  06/08/2016 DATE OF DISCHARGE:                              OPERATIVE REPORT   PREOPERATIVE DIAGNOSIS:  Spinal stenosis, L4-5 and L5-S1.  POSTOPERATIVE DIAGNOSIS:  Spinal stenosis, L4-5 and L5-S1.  PROCEDURES PERFORMED: 1. Microlumbar decompression at L4-5, L5-S1. 2. Bilateral hemilaminotomies at L4-5, L5-S1 3. Laminectomy of L5. 4. Foraminotomies at L5-S1, right.  ANESTHESIA:  General.  ASSISTANT:  Nehemiah Massed, PA.  HISTORY:  A 82 year old, bilateral lower extremity radicular pain secondary to HNP, spinal stenosis.  He had a sacralized lumbar vertebral bodies.  This was last opened disk space by MRI indicating multifactorial stenosis and predominantly right-sided symptoms we discussed possibly going on the right; however, preoperatively and evaluating the x-rays and the MRI, he had a very small interlaminar window precluding hemilaminotomy.  We discussed risks and benefits of the procedure including bleeding, infection, damage to the neurovascular structures, no change in symptoms, worsening symptoms, DVT, PE, anesthetic complications, etc.  TECHNIQUE:  With the patient in supine position, after induction of adequate general anesthesia, 2 g of Kefzol, placed prone on the Sonterra frame.  All bony prominences were well padded.  Lumbar region was prepped and draped in usual sterile fashion.  Two 18-gauge spinal needles were utilized to localize the 4-5 interspace and 5-1 interspace, confirmed with x-ray.  Incision was made from spinous process of L4-S1, subcutaneous tissue was dissected, electrocautery was utilized to achieve hemostasis.  A 0.25% Marcaine with epinephrine was infiltrated in the perimuscular tissue.  Dorsolumbar fascia identified, divided  in line with the skin incision.  Paraspinous muscle elevated from lamina of 4-5 and 5-1.  McCullough retractor was placed, operating microscope was draped and brought on the surgical field.  Confirmatory radiograph was obtained identifying the spinous processes of 4-5 and of S1.  Leksell rongeur was utilized to remove the spinous process of 5, partial of S1 and of 4.  A very small interlaminar window centrally and to the right. Following removal of the spinous process, we utilized the microcurette to detach the ligamentum flavum from the cephalad edge of S1 and the caudad edge of 5.  We performed hemilaminotomies bilaterally of 5 and centrally of 5.  I detached the ligamentum flavum, placed a neuro patty beneath the ligamentum flavum.  Continued cephalad, fairly hard bone. We performed a bilateral hemilaminotomies and a full laminectomy at 5. Then utilizing a Woodson retractor, decompressed the lateral recesses bilaterally to the medial border of the pedicle both at 4-5 and at 5-1. Performing a foraminotomy at 5, was fairly tight, fairly stenotic.  In addition, a foraminotomy of S1 on the right.  I also performed a foraminotomy of 5 and S1 on the left.  Decompressed the lateral recess to the medial border of pedicle on the left.  Good restoration of the thecal sac.  Severe stenosis was noted bilaterally at 5-1, compressing the 5 and the S1 nerve  roots.  At 4-5, hypertrophic ligamentum flavum was noted and we decompressed the lateral recess at the medial border of the pedicle.  No disk herniation was noted.  Bipolar electrocautery was utilized to achieve hemostasis.  Next, confirmatory radiograph obtained with markers at 4-5 and at 5-1. Last opened disk space was at 5-1.  No evidence of CSF leakage or active bleeding, copiously irrigated with antibiotic irrigation.  Thrombin- soaked Gelfoam was placed in laminotomy defect.  We removed the [facility] retractor, irrigated the paraspinous  musculature.  We closed the dorsolumbar fascia with 1 Vicryl, subcu with 2-0 and skin with Prolene.  Sterile dressing applied.  Placed supine on the hospital bed, extubated without difficulty, and transported to the recovery room in satisfactory condition.  The patient tolerated the procedure well.  No complications.  Assistant, Nehemiah Massed, PA, was used throughout the case for patient positioning, gentle intermittent neural traction, suction and closure.     Susa Day, M.D.     Geralynn Rile  D:  06/08/2016  T:  06/08/2016  Job:  [id number]

## 2016-06-21 DIAGNOSIS — I872 Venous insufficiency (chronic) (peripheral): Secondary | ICD-10-CM | POA: Diagnosis not present

## 2016-07-11 DIAGNOSIS — H353122 Nonexudative age-related macular degeneration, left eye, intermediate dry stage: Secondary | ICD-10-CM | POA: Diagnosis not present

## 2016-07-11 DIAGNOSIS — H35372 Puckering of macula, left eye: Secondary | ICD-10-CM | POA: Diagnosis not present

## 2016-07-11 DIAGNOSIS — H34211 Partial retinal artery occlusion, right eye: Secondary | ICD-10-CM | POA: Diagnosis not present

## 2016-07-11 DIAGNOSIS — H53411 Scotoma involving central area, right eye: Secondary | ICD-10-CM | POA: Diagnosis not present

## 2016-07-11 NOTE — Addendum Note (Signed)
Addendum  created 07/11/16 1414 by Myrtie Soman, MD   Sign clinical note

## 2016-07-11 NOTE — Anesthesia Postprocedure Evaluation (Signed)
Anesthesia Post Note  Patient: [name]  Procedure(s) Performed: Procedure(s) (LRB): Bilateral Microlumbar decompression L4-L5 and L5-S1 (Bilateral)     Anesthesia Post Evaluation  Last Vitals:  Vitals:   06/09/16 0610 06/09/16 0829  BP: (!) 142/75 136/78  Pulse: 64 65  Resp: 18   Temp: 36.6 C     Last Pain:  Vitals:   06/09/16 0935  TempSrc:   PainSc: 2                  Montrell Cessna S

## 2016-07-14 ENCOUNTER — Encounter: Payer: Self-pay | Admitting: Internal Medicine

## 2016-07-14 ENCOUNTER — Other Ambulatory Visit (INDEPENDENT_AMBULATORY_CARE_PROVIDER_SITE_OTHER): Payer: Medicare HMO

## 2016-07-14 ENCOUNTER — Ambulatory Visit (INDEPENDENT_AMBULATORY_CARE_PROVIDER_SITE_OTHER): Payer: Medicare HMO | Admitting: Internal Medicine

## 2016-07-14 VITALS — BP 106/70 | HR 64 | Ht 71.0 in | Wt 245.0 lb

## 2016-07-14 DIAGNOSIS — Z0000 Encounter for general adult medical examination without abnormal findings: Secondary | ICD-10-CM | POA: Diagnosis not present

## 2016-07-14 DIAGNOSIS — R202 Paresthesia of skin: Secondary | ICD-10-CM | POA: Insufficient documentation

## 2016-07-14 DIAGNOSIS — E119 Type 2 diabetes mellitus without complications: Secondary | ICD-10-CM

## 2016-07-14 HISTORY — DX: Paresthesia of skin: R20.2

## 2016-07-14 LAB — BASIC METABOLIC PANEL WITH GFR
BUN: 19 mg/dL (ref 6–23)
CO2: 29 meq/L (ref 19–32)
Calcium: 9.9 mg/dL (ref 8.4–10.5)
Chloride: 100 meq/L (ref 96–112)
Creatinine, Ser: 1.02 mg/dL (ref 0.40–1.50)
GFR: 74.79 mL/min
Glucose, Bld: 146 mg/dL — ABNORMAL HIGH (ref 70–99)
Potassium: 4.3 meq/L (ref 3.5–5.1)
Sodium: 139 meq/L (ref 135–145)

## 2016-07-14 LAB — URINALYSIS, ROUTINE W REFLEX MICROSCOPIC
Bilirubin Urine: NEGATIVE
HGB URINE DIPSTICK: NEGATIVE
Ketones, ur: NEGATIVE
Leukocytes, UA: NEGATIVE
NITRITE: NEGATIVE
Specific Gravity, Urine: 1.025 (ref 1.000–1.030)
Total Protein, Urine: NEGATIVE
URINE GLUCOSE: NEGATIVE
Urobilinogen, UA: 0.2 (ref 0.0–1.0)
pH: 5.5 (ref 5.0–8.0)

## 2016-07-14 LAB — LIPID PANEL
Cholesterol: 153 mg/dL (ref 0–200)
HDL: 46.3 mg/dL
LDL Cholesterol: 81 mg/dL (ref 0–99)
NonHDL: 106.54
Total CHOL/HDL Ratio: 3
Triglycerides: 127 mg/dL (ref 0.0–149.0)
VLDL: 25.4 mg/dL (ref 0.0–40.0)

## 2016-07-14 LAB — HEPATIC FUNCTION PANEL
ALK PHOS: 101 U/L (ref 39–117)
ALT: 17 U/L (ref 0–53)
AST: 16 U/L (ref 0–37)
Albumin: 4.6 g/dL (ref 3.5–5.2)
BILIRUBIN TOTAL: 0.9 mg/dL (ref 0.2–1.2)
Bilirubin, Direct: 0.2 mg/dL (ref 0.0–0.3)
Total Protein: 7.5 g/dL (ref 6.0–8.3)

## 2016-07-14 LAB — CBC WITH DIFFERENTIAL/PLATELET
Basophils Absolute: 0 10*3/uL (ref 0.0–0.1)
Basophils Relative: 0.5 % (ref 0.0–3.0)
Eosinophils Absolute: 0.4 10*3/uL (ref 0.0–0.7)
Eosinophils Relative: 4.4 % (ref 0.0–5.0)
HCT: 39.9 % (ref 39.0–52.0)
Hemoglobin: 13.8 g/dL (ref 13.0–17.0)
Lymphocytes Relative: 27.8 % (ref 12.0–46.0)
Lymphs Abs: 2.4 10*3/uL (ref 0.7–4.0)
MCHC: 34.5 g/dL (ref 30.0–36.0)
MCV: 92.8 fl (ref 78.0–100.0)
Monocytes Absolute: 0.5 10*3/uL (ref 0.1–1.0)
Monocytes Relative: 5.9 % (ref 3.0–12.0)
Neutro Abs: 5.3 10*3/uL (ref 1.4–7.7)
Neutrophils Relative %: 61.4 % (ref 43.0–77.0)
Platelets: 200 10*3/uL (ref 150.0–400.0)
RBC: 4.3 Mil/uL (ref 4.22–5.81)
RDW: 13.9 % (ref 11.5–15.5)
WBC: 8.6 10*3/uL (ref 4.0–10.5)

## 2016-07-14 LAB — MICROALBUMIN / CREATININE URINE RATIO
CREATININE, U: 153.4 mg/dL
MICROALB UR: 1.3 mg/dL (ref 0.0–1.9)
Microalb Creat Ratio: 0.8 mg/g (ref 0.0–30.0)

## 2016-07-14 LAB — HEMOGLOBIN A1C: Hgb A1c MFr Bld: 7.3 % — ABNORMAL HIGH (ref 4.6–6.5)

## 2016-07-14 LAB — PSA: PSA: 2.15 ng/mL (ref 0.10–4.00)

## 2016-07-14 LAB — VITAMIN B12: Vitamin B-12: 387 pg/mL (ref 211–911)

## 2016-07-14 LAB — TSH: TSH: 2.17 u[IU]/mL (ref 0.35–4.50)

## 2016-07-14 NOTE — Patient Instructions (Signed)

## 2016-07-14 NOTE — Progress Notes (Signed)
Subjective:    Patient ID: [name], male    DOB: 24-May-1934, 82 y.o.   MRN: [id number]  HPI Here for wellness and f/u;  Overall doing ok;  Pt denies Chest pain, worsening SOB, DOE, wheezing, orthopnea, PND, worsening LE edema, palpitations, dizziness or syncope.  Pt denies neurological change such as new headache, facial or extremity weakness, except has been having numbness and tingling to toes bilat for several months, mild and not bothersome, just mentions it. Does not wast NCS for now.  Pt denies polydipsia, polyuria, or low sugar symptoms. Pt states overall good compliance with treatment and medications, good tolerability, and has been trying to follow appropriate diet.  Pt denies worsening depressive symptoms, suicidal ideation or panic. No fever, night sweats, wt loss, loss of appetite, or other constitutional symptoms.  Pt states good ability with ADL's, has low fall risk, home safety reviewed and adequate, no other significant changes in hearing or vision, and only occasionally active with exercise.   Wt Readings from Last 3 Encounters:  07/14/16 245 lb (111.1 kg)  06/08/16 244 lb (110.7 kg)  06/03/16 244 lb (110.7 kg)   BP Readings from Last 3 Encounters:  07/14/16 106/70  06/09/16 136/78  06/03/16 118/66  s/p lumbar surgury in April with marked improvement of discomfort, per Dr Tonita Cong, ortho Past Medical History:  Diagnosis Date  . AAA (abdominal aortic aneurysm) ([place])    MONITORED BY DR LAWSON LOV JUNE 2014--  STABLE  AT 3.7  . Allergic rhinitis   . Arthritis   . Asymptomatic stenosis of left carotid artery without infarction    MILD DISEASE  . BPH (benign prostatic hypertrophy)   . Diverticulosis of colon   . History of cardiomyopathy    IDIOPATHIC-- PER [place] NOTE 2013 , CURRENTLY RESOLVED  . History of kidney stones   . Hyperlipidemia   . Hypertension   . Peripheral vascular disease ([place])   . Phimosis   . Right carotid artery occlusion    CHRONIC   WITHOUT  INFARTION  . Stroke [facility])     Right eye  . Type 2 diabetes mellitus ([place])   . Wears hearing aid    Past Surgical History:  Procedure Laterality Date  . CARDIOVASCULAR STRESS TEST  JUNE 2008  DR Wynonia Lawman  . CATARACT EXTRACTION W/ INTRAOCULAR LENS IMPLANT Right   . CIRCUMCISION N/A 08/30/2012   Procedure: CIRCUMCISION ADULT;  Surgeon: Malka So, MD;  Location: [facility];  Service: Urology;  Laterality: N/A;  . CYSTO/  RIGHT URETERAL STENT PLACEMENT  10-16-2003  . EXTRACORPOREAL SHOCK WAVE LITHOTRIPSY Right 2005  . EYE SURGERY    . JOINT REPLACEMENT Bilateral 2008  2009   Knee  . LUMBAR LAMINECTOMY/DECOMPRESSION MICRODISCECTOMY Bilateral 06/08/2016   Procedure: Bilateral Microlumbar decompression L4-L5 and L5-S1;  Surgeon: Susa Day, MD;  Location: [facility];  Service: Orthopedics;  Laterality: Bilateral;  Requests 2 hours  . TOTAL KNEE ARTHROPLASTY Bilateral RIGHT 10-08-2007;   LEFT 09-30-2008  . TRANSTHORACIC ECHOCARDIOGRAM  10-21-2009   LVEF 50%    reports that he quit smoking about 33 years ago. His smoking use included Cigarettes. He has a 18.75 pack-year smoking history. He has never used smokeless tobacco. He reports that he does not drink alcohol or use drugs. family history includes ALS in his father; Asthma in his mother; COPD in his mother; Depression in his mother; Diabetes in his father; Heart disease in his mother; Hypertension in his mother; Kidney disease  in his mother. No Known Allergies Current Outpatient Prescriptions on File Prior to Visit  Medication Sig Dispense Refill  . acetaminophen (TYLENOL) 500 MG tablet Take 1,000 mg by mouth every 6 (six) hours as needed (for pain).    Marland Kitchen atorvastatin (LIPITOR) 40 MG tablet TAKE ONE (1) TABLET EACH DAY 90 tablet 2  . beta carotene w/minerals (OCUVITE) tablet Take 1 tablet by mouth daily.    . carvedilol (COREG) 12.5 MG tablet TAKE 1/2 TABLET BY MOUTH TWICE DAILY WITH A MEAL (Patient taking differently: TAKE 1/2  TABLET (6.25 MG )BY MOUTH TWICE DAILY WITH A MEAL) 90 tablet 0  . glipiZIDE (GLUCOTROL XL) 5 MG 24 hr tablet Take 1 tablet (5 mg total) by mouth daily with breakfast. 90 tablet 3  . hydrochlorothiazide (HYDRODIURIL) 25 MG tablet TAKE ONE (1) TABLET EACH DAY 90 tablet 2  . HYDROcodone-acetaminophen (NORCO) 5-325 MG tablet Take 1 tablet by mouth every 6 (six) hours as needed for moderate pain. 30 tablet 0  . metFORMIN (GLUCOPHAGE) 1000 MG tablet Take 1 tablet (1,000 mg total) by mouth 2 (two) times daily. 180 tablet 3  . Multiple Vitamin (MULTIVITAMIN WITH MINERALS) TABS tablet Take 1 tablet by mouth daily.    . traMADol (ULTRAM) 50 MG tablet Take 50 mg by mouth 3 (three) times daily as needed. For back pain    . triamcinolone cream (KENALOG) 0.1 % Apply 1 application topically 2 (two) times daily. (Patient taking differently: Apply 1 application topically 2 (two) times daily as needed (for rash on feet). ) 30 g 0   No current facility-administered medications on file prior to visit.    Review of Systems Constitutional: Negative for other unusual diaphoresis, sweats, appetite or weight changes HENT: Negative for other worsening hearing loss, ear pain, facial swelling, mouth sores or neck stiffness.   Eyes: Negative for other worsening pain, redness or other visual disturbance.  Respiratory: Negative for other stridor or swelling Cardiovascular: Negative for other palpitations or other chest pain  Gastrointestinal: Negative for worsening diarrhea or loose stools, blood in stool, distention or other pain Genitourinary: Negative for hematuria, flank pain or other change in urine volume.  Musculoskeletal: Negative for myalgias or other joint swelling.  Skin: Negative for other color change, or other wound or worsening drainage.  Neurological: Negative for other syncope or numbness. Hematological: Negative for other adenopathy or swelling Psychiatric/Behavioral: Negative for hallucinations, other  worsening agitation, SI, self-injury, or new decreased concentration All other system neg per pt    Objective:   Physical Exam BP 106/70   Pulse 64   Ht 5\' 11"  (1.803 m)   Wt 245 lb (111.1 kg)   SpO2 99%   BMI 34.17 kg/m  VS noted,  Constitutional: Pt is oriented to person, place, and time. Appears well-developed and well-nourished, in no significant distress and comfortable Head: Normocephalic and atraumatic  Eyes: Conjunctivae and EOM are normal. Pupils are equal, round, and reactive to light Right Ear: External ear normal without discharge Left Ear: External ear normal without discharge Nose: Nose without discharge or deformity Mouth/Throat: Oropharynx is without other ulcerations and moist  Neck: Normal range of motion. Neck supple. No JVD present. No tracheal deviation present or significant neck LA or mass Cardiovascular: Normal rate, regular rhythm, normal heart sounds and intact distal pulses.   Pulmonary/Chest: WOB normal and breath sounds without rales or wheezing  Abdominal: Soft. Bowel sounds are normal. NT. No HSM  Musculoskeletal: Normal range of motion. Exhibits no edema  Lymphadenopathy: Has no other cervical adenopathy.  Neurological: Pt is alert and oriented to person, place, and time. Pt has normal reflexes. No cranial nerve deficit. Motor grossly intact, Gait intact Skin: Skin is warm and dry. No rash noted or new ulcerations Psychiatric:  Has normal mood and affect. Behavior is normal without agitation + decresed sens to LT to toes bilat No other exam findings  Lab Results  Component Value Date   WBC 6.9 06/03/2016   HGB 13.1 06/03/2016   HCT 38.8 (L) 06/03/2016   PLT 211 06/03/2016   GLUCOSE 235 (H) 06/09/2016   CHOL 144 05/10/2016   TRIG 168.0 (H) 05/10/2016   HDL 39.90 05/10/2016   LDLDIRECT 147.0 10/22/2014   LDLCALC 70 05/10/2016   ALT 21 06/09/2016   AST 23 06/09/2016   NA 136 06/09/2016   K 4.1 06/09/2016   CL 102 06/09/2016   CREATININE  0.90 06/09/2016   BUN 17 06/09/2016   CO2 26 06/09/2016   TSH 2.53 09/10/2012   PSA 1.59 04/22/2014   INR 1.1 09/24/2008   HGBA1C 7.4 (H) 05/10/2016   MICROALBUR 2.8 (H) 09/10/2012       Assessment & Plan:

## 2016-07-14 NOTE — Assessment & Plan Note (Signed)
C/w prob early neuropathy, possible age or DM related, for b12 check today with labs, o/w follow

## 2016-07-14 NOTE — Assessment & Plan Note (Signed)
stable overall by history and exam, recent data reviewed with pt, and pt to continue medical treatment as before,  to f/u any worsening symptoms or concerns Lab Results  Component Value Date   HGBA1C 7.4 (H) 05/10/2016   For f/u a1c today, has been stable wt after losing 20 lbs last yr

## 2016-07-14 NOTE — Assessment & Plan Note (Signed)

## 2016-07-19 DIAGNOSIS — R262 Difficulty in walking, not elsewhere classified: Secondary | ICD-10-CM | POA: Diagnosis not present

## 2016-07-19 DIAGNOSIS — M5116 Intervertebral disc disorders with radiculopathy, lumbar region: Secondary | ICD-10-CM | POA: Diagnosis not present

## 2016-07-19 DIAGNOSIS — Z9889 Other specified postprocedural states: Secondary | ICD-10-CM | POA: Diagnosis not present

## 2016-07-19 DIAGNOSIS — M545 Low back pain: Secondary | ICD-10-CM | POA: Diagnosis not present

## 2016-07-28 ENCOUNTER — Emergency Department (HOSPITAL_BASED_OUTPATIENT_CLINIC_OR_DEPARTMENT_OTHER)
Admission: EM | Admit: 2016-07-28 | Discharge: 2016-07-28 | Disposition: A | Discharge status: Home / Self Care | Payer: Medicare HMO | Attending: Emergency Medicine | Admitting: Emergency Medicine

## 2016-07-28 ENCOUNTER — Encounter (HOSPITAL_BASED_OUTPATIENT_CLINIC_OR_DEPARTMENT_OTHER): Payer: Self-pay | Admitting: *Deleted

## 2016-07-28 ENCOUNTER — Emergency Department (HOSPITAL_BASED_OUTPATIENT_CLINIC_OR_DEPARTMENT_OTHER): Payer: Medicare HMO

## 2016-07-28 DIAGNOSIS — E119 Type 2 diabetes mellitus without complications: Secondary | ICD-10-CM | POA: Insufficient documentation

## 2016-07-28 DIAGNOSIS — Z87891 Personal history of nicotine dependence: Secondary | ICD-10-CM | POA: Insufficient documentation

## 2016-07-28 DIAGNOSIS — Z7984 Long term (current) use of oral hypoglycemic drugs: Secondary | ICD-10-CM | POA: Insufficient documentation

## 2016-07-28 DIAGNOSIS — N200 Calculus of kidney: Secondary | ICD-10-CM | POA: Diagnosis not present

## 2016-07-28 DIAGNOSIS — R109 Unspecified abdominal pain: Secondary | ICD-10-CM | POA: Diagnosis not present

## 2016-07-28 DIAGNOSIS — I10 Essential (primary) hypertension: Secondary | ICD-10-CM | POA: Diagnosis not present

## 2016-07-28 DIAGNOSIS — Z79899 Other long term (current) drug therapy: Secondary | ICD-10-CM | POA: Diagnosis not present

## 2016-07-28 LAB — CBC WITH DIFFERENTIAL/PLATELET
BASOS ABS: 0 10*3/uL (ref 0.0–0.1)
BASOS PCT: 0 %
EOS ABS: 0.2 10*3/uL (ref 0.0–0.7)
EOS PCT: 2 %
HCT: 40.4 % (ref 39.0–52.0)
Hemoglobin: 14 g/dL (ref 13.0–17.0)
Lymphocytes Relative: 19 %
Lymphs Abs: 2 10*3/uL (ref 0.7–4.0)
MCH: 31.8 pg (ref 26.0–34.0)
MCHC: 34.7 g/dL (ref 30.0–36.0)
MCV: 91.8 fL (ref 78.0–100.0)
Monocytes Absolute: 0.8 10*3/uL (ref 0.1–1.0)
Monocytes Relative: 7 %
Neutro Abs: 7.9 10*3/uL — ABNORMAL HIGH (ref 1.7–7.7)
Neutrophils Relative %: 72 %
Platelets: 158 10*3/uL (ref 150–400)
RBC: 4.4 MIL/uL (ref 4.22–5.81)
RDW: 13.4 % (ref 11.5–15.5)
WBC: 10.8 10*3/uL — AB (ref 4.0–10.5)

## 2016-07-28 LAB — URINALYSIS, ROUTINE W REFLEX MICROSCOPIC
Bilirubin Urine: NEGATIVE
Glucose, UA: NEGATIVE mg/dL
Ketones, ur: NEGATIVE mg/dL
NITRITE: NEGATIVE
Protein, ur: NEGATIVE mg/dL
SPECIFIC GRAVITY, URINE: 1.017 (ref 1.005–1.030)
pH: 6.5 (ref 5.0–8.0)

## 2016-07-28 LAB — COMPREHENSIVE METABOLIC PANEL
ALT: 20 U/L (ref 17–63)
AST: 29 U/L (ref 15–41)
Albumin: 4.3 g/dL (ref 3.5–5.0)
Alkaline Phosphatase: 93 U/L (ref 38–126)
Anion gap: 10 (ref 5–15)
BUN: 20 mg/dL (ref 6–20)
CHLORIDE: 99 mmol/L — AB (ref 101–111)
CO2: 27 mmol/L (ref 22–32)
CREATININE: 1.35 mg/dL — AB (ref 0.61–1.24)
Calcium: 9.4 mg/dL (ref 8.9–10.3)
GFR calc Af Amer: 56 mL/min — ABNORMAL LOW (ref >60)
GFR calc non Af Amer: 48 mL/min — ABNORMAL LOW (ref >60)
Glucose, Bld: 171 mg/dL — ABNORMAL HIGH (ref 65–99)
POTASSIUM: 5 mmol/L (ref 3.5–5.1)
SODIUM: 136 mmol/L (ref 135–145)
Total Bilirubin: 1.5 mg/dL — ABNORMAL HIGH (ref 0.3–1.2)
Total Protein: 7.9 g/dL (ref 6.5–8.1)

## 2016-07-28 LAB — URINALYSIS, MICROSCOPIC (REFLEX): SQUAMOUS EPITHELIAL / LPF: NONE SEEN

## 2016-07-28 MED ORDER — ONDANSETRON HCL 4 MG PO TABS: 4.0000 mg | ORAL_TABLET | Freq: Three times a day (TID) | ORAL | 0 refills | Status: DC | PRN

## 2016-07-28 MED ORDER — KETOROLAC TROMETHAMINE 60 MG/2ML IM SOLN
30.0000 mg | Freq: Once | INTRAMUSCULAR | Status: AC
  Administered 2016-07-28: 30 mg via INTRAMUSCULAR
  Filled 2016-07-28: qty 2

## 2016-07-28 NOTE — ED Triage Notes (Addendum)
pt c/o sudden onset of left flank pain, n/v  with hematuria x 1 day

## 2016-07-28 NOTE — Discharge Instructions (Signed)
Take anti-inflammatories as needed for pain. Increase fluid intake. Follow-up with urology if symptoms do not improve in one week. Return to ED for worsening pain, urinary symptoms, increased blood in urine, lightheadedness, weakness, blood in stool, abdominal pain.

## 2016-07-28 NOTE — ED Notes (Signed)
Pt verbalizes understanding of d/c instructions and denies any further needs at this time. 

## 2016-07-28 NOTE — ED Provider Notes (Signed)
Medical screening examination/treatment/procedure(s) were conducted as a shared visit with non-physician practitioner(s) and myself.  I personally evaluated the patient during the encounter.  Left sided back pain radiatng to left groin for last few hours associated with hematuria. Similar to previous kidney stones.  On exam, pain improved. VS WNL. Heart rrr no m/r/g. No pulsatile mass on abdomien. Mild cva ttp but no rash. No suprapubic ttp.  Likely stone. Workup/treat for same. Urology follow up in a few days if not improving.     Edilson Vital, Corene Cornea, MD 07/30/16 [phone]

## 2016-07-28 NOTE — ED Notes (Signed)
Per RN Sarah, gave Pt cheese and salting crackers and a diet ginger ale

## 2016-07-28 NOTE — ED Provider Notes (Signed)
[place] [facility] Provider Note   CSN: [id number] Arrival date & time: 07/28/16  1730  By signing my name below, I, Sonum Patel, attest that this documentation has been prepared under the direction and in the presence of Ercell Razon, PA-C. Electronically Signed: Ludger Nutting, Scribe. 07/28/16. 6:44 PM.  History   Chief Complaint Chief Complaint  Patient presents with  . Flank Pain    The history is provided by the patient. No language interpreter was used.     HPI Comments: [name] is a 82 y.o. male who presents to the Emergency Department complaining of sudden onset, non-radiating, left sided flank pain that began this morning. He notes associated nausea, a few episodes of vomiting, hematuria, and dysuria. He reports similar symptoms 5 years ago with a prior kidney stone; states it required lithotripsy. He denies fever, decreased urine, cough, sneezing, rhinorrhea, Chest pain, shortness of breath, diarrhea or blood in stool. He has HLD, HTN, NIDDM and takes medications for them.   Past Medical History:  Diagnosis Date  . AAA (abdominal aortic aneurysm) ([place])    MONITORED BY DR LAWSON LOV JUNE 2014--  STABLE  AT 3.7  . Allergic rhinitis   . Arthritis   . Asymptomatic stenosis of left carotid artery without infarction    MILD DISEASE  . BPH (benign prostatic hypertrophy)   . Diverticulosis of colon   . History of cardiomyopathy    IDIOPATHIC-- PER [facility] NOTE 2013 , CURRENTLY RESOLVED  . History of kidney stones   . Hyperlipidemia   . Hypertension   . Peripheral vascular disease ([place])   . Phimosis   . Right carotid artery occlusion    CHRONIC   WITHOUT INFARTION  . Stroke [facility])     Right eye  . Type 2 diabetes mellitus ([place])   . Wears hearing aid     Patient Active Problem List   Diagnosis Date Noted  . Paresthesia of both feet 07/14/2016  . Spinal stenosis at L4-L5 level 06/08/2016  . Preop exam for internal medicine 05/10/2016  . Preventative health  care 01/14/2016  . HTN (hypertension) 01/14/2016  . Rash 05/01/2015  . Obesity (BMI 30-39.9)   . Hearing loss of both ears 10/23/2013  . Abdominal aneurysm without mention of rupture 07/24/2012  . Bilateral carotid artery stenosis   . Acute sinus infection 06/17/2009  . Diabetes mellitus type 2, noninsulin dependent ([place]) 07/20/2007  . Anxiety state 07/20/2007  . Peripheral vascular disease ([place]) 07/20/2007  . Hyperlipidemia   . Hypertensive heart disease without CHF   . BPH (benign prostatic hypertrophy)   . Personal history of kidney stones     Past Surgical History:  Procedure Laterality Date  . CARDIOVASCULAR STRESS TEST  JUNE 2008  DR Wynonia Lawman  . CATARACT EXTRACTION W/ INTRAOCULAR LENS IMPLANT Right   . CIRCUMCISION N/A 08/30/2012   Procedure: CIRCUMCISION ADULT;  Surgeon: Malka So, MD;  Location: [facility];  Service: Urology;  Laterality: N/A;  . CYSTO/  RIGHT URETERAL STENT PLACEMENT  10-16-2003  . EXTRACORPOREAL SHOCK WAVE LITHOTRIPSY Right 2005  . EYE SURGERY    . JOINT REPLACEMENT Bilateral 2008  2009   Knee  . LUMBAR LAMINECTOMY/DECOMPRESSION MICRODISCECTOMY Bilateral 06/08/2016   Procedure: Bilateral Microlumbar decompression L4-L5 and L5-S1;  Surgeon: Susa Day, MD;  Location: [facility];  Service: Orthopedics;  Laterality: Bilateral;  Requests 2 hours  . TOTAL KNEE ARTHROPLASTY Bilateral RIGHT 10-08-2007;   LEFT 09-30-2008  . TRANSTHORACIC ECHOCARDIOGRAM  10-21-2009   LVEF 50%       Home Medications    Prior to Admission medications   Medication Sig Start Date End Date Taking? Authorizing Provider  acetaminophen (TYLENOL) 500 MG tablet Take 1,000 mg by mouth every 6 (six) hours as needed (for pain).    [provider]  atorvastatin (LIPITOR) 40 MG tablet TAKE ONE (1) TABLET EACH DAY 05/03/16   Biagio Borg, MD  beta carotene w/minerals (OCUVITE) tablet Take 1 tablet by mouth daily.    [provider]  carvedilol (COREG) 12.5  MG tablet TAKE 1/2 TABLET BY MOUTH TWICE DAILY WITH A MEAL Patient taking differently: TAKE 1/2 TABLET (6.25 MG )BY MOUTH TWICE DAILY WITH A MEAL 03/21/16   Biagio Borg, MD  glipiZIDE (GLUCOTROL XL) 5 MG 24 hr tablet Take 1 tablet (5 mg total) by mouth daily with breakfast. 05/11/16   Biagio Borg, MD  hydrochlorothiazide (HYDRODIURIL) 25 MG tablet TAKE ONE (1) TABLET EACH DAY 05/03/16   Biagio Borg, MD  HYDROcodone-acetaminophen (NORCO) 5-325 MG tablet Take 1 tablet by mouth every 6 (six) hours as needed for moderate pain. 06/08/16   Susa Day, MD  metFORMIN (GLUCOPHAGE) 1000 MG tablet Take 1 tablet (1,000 mg total) by mouth 2 (two) times daily. 12/24/14   Biagio Borg, MD  Multiple Vitamin (MULTIVITAMIN WITH MINERALS) TABS tablet Take 1 tablet by mouth daily.    [provider]  ondansetron (ZOFRAN) 4 MG tablet Take 1 tablet (4 mg total) by mouth every 8 (eight) hours as needed for nausea or vomiting. 07/28/16   Khalifa Knecht, PA-C  traMADol (ULTRAM) 50 MG tablet Take 50 mg by mouth 3 (three) times daily as needed. For back pain 05/03/16   [provider]  triamcinolone cream (KENALOG) 0.1 % Apply 1 application topically 2 (two) times daily. Patient taking differently: Apply 1 application topically 2 (two) times daily as needed (for rash on feet).  10/29/14   Biagio Borg, MD    Family History Family History  Problem Relation Age of Onset  . Heart disease Mother        CHF  Before age 77  . COPD Mother   . Kidney disease Mother   . Hypertension Mother   . Asthma Mother   . Depression Mother   . ALS Father   . Diabetes Father   . Colon cancer Neg Hx     Social History Social History  Substance Use Topics  . Smoking status: Former Smoker    Packs/day: 0.75    Years: 25.00    Types: Cigarettes    Quit date: 07/25/1982  . Smokeless tobacco: Never Used  . Alcohol use No     Comment: recovering alcoholic  [id number]     Allergies   Patient has no known  allergies.   Review of Systems Review of Systems  Constitutional: Negative for fever.  Respiratory: Negative for shortness of breath.   Cardiovascular: Negative for chest pain.  Gastrointestinal: Positive for nausea and vomiting. Negative for abdominal pain.  Genitourinary: Positive for dysuria, flank pain and hematuria. Negative for decreased urine volume.  Musculoskeletal: Negative for back pain.  All other systems reviewed and are negative.    Physical Exam Updated Vital Signs BP (!) 159/82 (BP Location: Right Arm)   Pulse 61   Temp 98 F (36.7 C)   Resp 20   Ht 5\' 11"  (1.803 m)   Wt 111.1 kg (245 lb)  SpO2 97%   BMI 34.17 kg/m   Physical Exam  Constitutional: He appears well-developed and well-nourished. No distress.  HENT:  Head: Normocephalic and atraumatic.  Nose: Nose normal.  Eyes: Conjunctivae and EOM are normal. Left eye exhibits no discharge. No scleral icterus.  Neck: Normal range of motion. Neck supple.  Cardiovascular: Normal rate, regular rhythm, normal heart sounds and intact distal pulses.  Exam reveals no gallop and no friction rub.   No murmur heard. Pulmonary/Chest: Effort normal and breath sounds normal. No respiratory distress.  Abdominal: Soft. Bowel sounds are normal. He exhibits no distension. There is tenderness. There is no rebound and no guarding.  No CVA tenderness. Left lateral and lower abdominal tenderness.   Musculoskeletal: Normal range of motion. He exhibits no edema.  Neurological: He is alert. He exhibits normal muscle tone. Coordination normal.  Skin: Skin is warm and dry. No rash noted.  Psychiatric: He has a normal mood and affect.  Nursing note and vitals reviewed.    ED Treatments / Results  DIAGNOSTIC STUDIES: Oxygen Saturation is 98% on RA, normal by my interpretation.    COORDINATION OF CARE: 6:43 PM Discussed treatment plan with pt at bedside and pt agreed to plan.   Labs (all labs ordered are listed, but only  abnormal results are displayed) Labs Reviewed  URINALYSIS, ROUTINE W REFLEX MICROSCOPIC - Abnormal; Notable for the following:       Result Value   Color, Urine AMBER (*)    APPearance CLOUDY (*)    Hgb urine dipstick LARGE (*)    Leukocytes, UA SMALL (*)    All other components within normal limits  URINALYSIS, MICROSCOPIC (REFLEX) - Abnormal; Notable for the following:    Bacteria, UA RARE (*)    All other components within normal limits  CBC WITH DIFFERENTIAL/PLATELET - Abnormal; Notable for the following:    WBC 10.8 (*)    Neutro Abs 7.9 (*)    All other components within normal limits  COMPREHENSIVE METABOLIC PANEL - Abnormal; Notable for the following:    Chloride 99 (*)    Glucose, Bld 171 (*)    Creatinine, Ser 1.35 (*)    Total Bilirubin 1.5 (*)    GFR calc non Af Amer 48 (*)    GFR calc Af Amer 56 (*)    All other components within normal limits    EKG  EKG Interpretation None       Radiology Ct Renal Stone Study  Result Date: 07/28/2016 CLINICAL DATA:  82 year old male with left flank pain. EXAM: CT ABDOMEN AND PELVIS WITHOUT CONTRAST TECHNIQUE: Multidetector CT imaging of the abdomen and pelvis was performed following the standard protocol without IV contrast. COMPARISON:  CT of the abdomen pelvis dated 10/11/2003 FINDINGS: Evaluation of this exam is limited in the absence of intravenous contrast. Lower chest: The visualized lung bases are clear. Multi vessel coronary vascular calcification involving the LAD, left main, RCA, and left circumflex artery. No intra-abdominal free air or free fluid. Hepatobiliary: There is mild fatty infiltration of the liver. There is slight irregular appearance of the hepatic contour. Correlation with clinical exam and LFTs recommended to evaluate for underlying liver disease or early cirrhosis. Small layering stones noted within the gallbladder. No evidence of acute inflammation by CT. Ultrasound may provide better evaluation of the  gallbladder if clinically indicated. No intrahepatic biliary ductal dilatation. Pancreas: Unremarkable. No pancreatic ductal dilatation or surrounding inflammatory changes. Spleen: Normal in size without focal abnormality. Adrenals/Urinary Tract: The  adrenal glands are unremarkable. There is a 4 mm stone in the distal left ureter adjacent to the left ureterovesical junction. There is mild left hydronephrosis. Mild left perinephric stranding noted which may be reactive. Correlation with urinalysis recommended to exclude superimposed UTI. Punctate nonobstructing right renal superior pole calculus. No hydronephrosis. The urinary bladder is only partially distended and appears grossly unremarkable. Stomach/Bowel: Sigmoid diverticulosis with scattered colonic diverticula without active inflammatory changes. There is no evidence of bowel obstruction or active inflammation. Normal appendix Vascular/Lymphatic: There is advanced aortoiliac atherosclerotic disease. There is a 3.5 cm infrarenal abdominal aortic aneurysm stable from the CT of 2005. The IVC appears unremarkable. No portal venous gas identified. Evaluation of the vasculature is limited in the absence of intravenous contrast. There is no adenopathy. Reproductive: There is mild enlargement of the prostate gland measuring up to 6 cm in transverse diameter. Coarse calcification of the central prostate noted. The seminal vesicles are grossly unremarkable. No pelvic mass. Other: There is a small umbilical hernia containing a short segment of small bowel without evidence of inflammatory changes or obstruction. Musculoskeletal: Degenerative changes of the spine. Multilevel disc desiccation with vacuum phenomena noted. There is postsurgical changes of laminectomy at L5. A transitional vertebra is seen. No acute osseous pathology. IMPRESSION: 1. The 4 mm stone in the distal left ureter adjacent to the left UVJ with mild left hydronephrosis. Correlation with urinalysis  recommended to exclude superimposed UTI. 2. Cholelithiasis. Ultrasound may provide better evaluation of the gallbladder clinically indicated. 3. Probable mild fatty infiltration of the liver. 4. Colonic diverticulosis without active inflammatory changes. No bowel obstruction. Normal appendix. 5. A 3.5 cm infrarenal abdominal aortic aneurysm Recommend followup by ultrasound in 2 years. This recommendation follows ACR consensus guidelines: White Paper of the ACR Incidental Findings Committee II on Vascular Findings. J Am Coll Radiol 2013; 10:789-794. 6. Multi vessel coronary vascular calcification. 7.  Aortic Atherosclerosis (ICD10-I70.0). Electronically Signed   By: Anner Crete M.D.   On: 07/28/2016 19:16    Procedures Procedures (including critical care time)  Medications Ordered in ED Medications  ketorolac (TORADOL) injection 30 mg (30 mg Intramuscular Given 07/28/16 2000)     Initial Impression / Assessment and Plan / ED Course  I have reviewed the triage vital signs and the nursing notes.  Pertinent labs & imaging results that were available during my care of the patient were reviewed by me and considered in my medical decision making (see chart for details).     Patient presents to ED for sudden onset of left flank pain and hematuria. Patient states he has a history of kidney stones with the most recent one being approximately 6 years ago for which he required lithotripsy. He states that his pain feels similar to that episode. He is afebrile with no history of fever. On physical exam he has left-sided lateral abdominal tenderness and left lower quadrant tenderness. No pulsatile abdominal mass palpated. CBC and CMP unremarkable. UA showed hematuria. No signs of UTI. CT renal stone study was obtained and showed 4 mm stone in the distal left ureter with mild hydronephrosis present. Patient is nontoxic appearing and is not in distress at this time. Patient given Toradol here in the ED with  symptomatic relief of pain. I advised patient to continue taking NSAIDs, increase fluid intake and to follow up with urology if symptoms do not improve in the week. Advised PCP follow-up for further evaluation. Patient appears stable for discharge at this time. Strict return precautions given.  Final Clinical Impressions(s) / ED Diagnoses   Final diagnoses:  Flank pain  Nephrolithiasis    New Prescriptions Discharge Medication List as of 07/28/2016  8:03 PM     I personally performed the services described in this documentation, which was scribed in my presence. The recorded information has been reviewed and is accurate.    Delia Heady, PA-C 07/29/16 0106    Merrily Pew, MD 07/30/16 0700

## 2016-08-08 ENCOUNTER — Encounter: Payer: Self-pay | Admitting: Family

## 2016-08-15 ENCOUNTER — Other Ambulatory Visit: Payer: Self-pay

## 2016-08-15 DIAGNOSIS — I714 Abdominal aortic aneurysm, without rupture, unspecified: Secondary | ICD-10-CM

## 2016-08-15 DIAGNOSIS — I6523 Occlusion and stenosis of bilateral carotid arteries: Secondary | ICD-10-CM

## 2016-08-17 ENCOUNTER — Encounter: Payer: Self-pay | Admitting: Family

## 2016-08-17 ENCOUNTER — Ambulatory Visit (HOSPITAL_COMMUNITY)
Admission: RE | Admit: 2016-08-17 | Discharge: 2016-08-17 | Disposition: A | Discharge status: Home / Self Care | Payer: Medicare HMO | Source: Ambulatory Visit | Attending: Family | Admitting: Family

## 2016-08-17 ENCOUNTER — Ambulatory Visit (INDEPENDENT_AMBULATORY_CARE_PROVIDER_SITE_OTHER)
Admission: RE | Admit: 2016-08-17 | Discharge: 2016-08-17 | Disposition: A | Discharge status: Home / Self Care | Payer: Medicare HMO | Source: Ambulatory Visit | Attending: Vascular Surgery | Admitting: Vascular Surgery

## 2016-08-17 ENCOUNTER — Ambulatory Visit (INDEPENDENT_AMBULATORY_CARE_PROVIDER_SITE_OTHER): Payer: Medicare HMO | Admitting: Family

## 2016-08-17 VITALS — BP 126/80 | HR 56 | Temp 97.3°F | Resp 18 | Ht 71.0 in | Wt 246.0 lb

## 2016-08-17 DIAGNOSIS — I6523 Occlusion and stenosis of bilateral carotid arteries: Secondary | ICD-10-CM | POA: Insufficient documentation

## 2016-08-17 DIAGNOSIS — R0989 Other specified symptoms and signs involving the circulatory and respiratory systems: Secondary | ICD-10-CM | POA: Diagnosis not present

## 2016-08-17 DIAGNOSIS — I6521 Occlusion and stenosis of right carotid artery: Secondary | ICD-10-CM | POA: Diagnosis not present

## 2016-08-17 DIAGNOSIS — I714 Abdominal aortic aneurysm, without rupture, unspecified: Secondary | ICD-10-CM

## 2016-08-17 DIAGNOSIS — I6522 Occlusion and stenosis of left carotid artery: Secondary | ICD-10-CM | POA: Diagnosis not present

## 2016-08-17 LAB — VAS US CAROTID
LCCADSYS: 83 cm/s
LCCAPDIAS: 23 cm/s
LEFT ECA DIAS: -11 cm/s
LEFT VERTEBRAL DIAS: -16 cm/s
Left CCA dist dias: 25 cm/s
Left CCA prox sys: 87 cm/s
Left ICA dist dias: -26 cm/s
Left ICA dist sys: -65 cm/s
RCCAPDIAS: 0 cm/s
RIGHT CCA MID DIAS: 0 cm/s
RIGHT ECA DIAS: 7 cm/s
RIGHT VERTEBRAL DIAS: 14 cm/s
Right CCA prox sys: 57 cm/s

## 2016-08-17 NOTE — Progress Notes (Signed)
[facility] [place] HISTORY AND PHYSICAL   MRN : [id number]  History of Present Illness:   [name] is a 82 y.o. male patient of Dr. Kellie Simmering whom we are following for carotid artery occlusive disease and a small aortic aneurysm. He has a known occlusion of his right internal carotid artery, we are monitoring the right ICA. He returns today for follow up. 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 He denies abdominal pain.  He had surgery at L4-5 about May 2018, states this has helped his right leg radiculopathy and weakness, he is performing exercises to strengthen his leg.  Pt states he has tingling in the toes of both feet.  He denies claudication symptoms in legs with walking, denies non healing wounds. He sees a podiatrist to trim his thick toenails.  He takes a daily ASA, daily statin, and a beta blocker, denies taking any anticoagulants, states he has cardiomyopathy, denies any history of MI.  Pt Diabetic: Yes, 07-14-16 A1C was 7.3 (review of records) Pt smoker: former smoker, quit about 1985  He is a recovering alcoholic.   Pt meds include: Statin :Yes Betablocker: Yes ASA: Yes Other anticoagulants/antiplatelets: no    Current Outpatient Prescriptions  Medication Sig Dispense Refill  . acetaminophen (TYLENOL) 500 MG tablet Take 1,000 mg by mouth every 6 (six) hours as needed (for pain).    Marland Kitchen atorvastatin (LIPITOR) 40 MG tablet TAKE ONE (1) TABLET EACH DAY 90 tablet 2  . beta carotene w/minerals (OCUVITE) tablet Take 1 tablet by mouth daily.    . carvedilol (COREG) 12.5 MG tablet TAKE 1/2 TABLET BY MOUTH TWICE DAILY WITH A MEAL (Patient taking differently: TAKE 1/2 TABLET  (6.25 MG )BY MOUTH TWICE DAILY WITH A MEAL) 90 tablet 0  . glipiZIDE (GLUCOTROL XL) 5 MG 24 hr tablet Take 1 tablet (5 mg total) by mouth daily with breakfast. 90 tablet 3  . hydrochlorothiazide (HYDRODIURIL) 25 MG tablet TAKE ONE (1) TABLET EACH DAY 90 tablet 2  . HYDROcodone-acetaminophen (NORCO) 5-325 MG tablet Take 1 tablet by mouth every 6 (six) hours as needed for moderate pain. 30 tablet 0  . metFORMIN (GLUCOPHAGE) 1000 MG tablet Take 1 tablet (1,000 mg total) by mouth 2 (two) times daily. 180 tablet 3  . Multiple Vitamin (MULTIVITAMIN WITH MINERALS) TABS tablet Take 1 tablet by mouth daily.    . ondansetron (ZOFRAN) 4 MG tablet Take 1 tablet (4 mg total) by mouth every 8 (eight) hours as needed for nausea or vomiting. 20 tablet 0  . traMADol (ULTRAM) 50 MG tablet Take 50 mg by mouth 3 (three) times daily as needed. For back pain    . triamcinolone cream (KENALOG) 0.1 % Apply 1 application topically 2 (two) times daily. (Patient taking differently: Apply 1 application topically 2 (two) times daily as needed (for rash on feet). ) 30 g 0   No current facility-administered medications for this visit.     Past Medical History:  Diagnosis Date  . AAA (abdominal aortic aneurysm) ([place])    MONITORED BY DR LAWSON LOV JUNE 2014--  STABLE  AT 3.7  . Allergic rhinitis   . Arthritis   . Asymptomatic stenosis of left carotid artery  without infarction    MILD DISEASE  . BPH (benign prostatic hypertrophy)   . Diverticulosis of colon   . History of cardiomyopathy    IDIOPATHIC-- PER [place] NOTE 2013 , CURRENTLY RESOLVED  . History of kidney stones   . Hyperlipidemia   . Hypertension   . Peripheral vascular disease ([place])   . Phimosis   . Right carotid artery occlusion    CHRONIC   WITHOUT INFARTION  . Stroke [facility])     Right eye  . Type 2 diabetes mellitus ([place])   . Wears hearing aid     Social History Social History  Substance Use Topics  . Smoking status: Former Smoker    Packs/day:  0.75    Years: 25.00    Types: Cigarettes    Quit date: 07/25/1982  . Smokeless tobacco: Never Used  . Alcohol use No     Comment: recovering alcoholic  [id number]    Family History Family History  Problem Relation Age of Onset  . Heart disease Mother        CHF  Before age 88  . COPD Mother   . Kidney disease Mother   . Hypertension Mother   . Asthma Mother   . Depression Mother   . ALS Father   . Diabetes Father   . Colon cancer Neg Hx     Surgical History Past Surgical History:  Procedure Laterality Date  . CARDIOVASCULAR STRESS TEST  JUNE 2008  DR Wynonia Lawman  . CATARACT EXTRACTION W/ INTRAOCULAR LENS IMPLANT Right   . CIRCUMCISION N/A 08/30/2012   Procedure: CIRCUMCISION ADULT;  Surgeon: Malka So, MD;  Location: [facility];  Service: Urology;  Laterality: N/A;  . CYSTO/  RIGHT URETERAL STENT PLACEMENT  10-16-2003  . EXTRACORPOREAL SHOCK WAVE LITHOTRIPSY Right 2005  . EYE SURGERY    . JOINT REPLACEMENT Bilateral 2008  2009   Knee  . LUMBAR LAMINECTOMY/DECOMPRESSION MICRODISCECTOMY Bilateral 06/08/2016   Procedure: Bilateral Microlumbar decompression L4-L5 and L5-S1;  Surgeon: Susa Day, MD;  Location: [facility];  Service: Orthopedics;  Laterality: Bilateral;  Requests 2 hours  . TOTAL KNEE ARTHROPLASTY Bilateral RIGHT 10-08-2007;   LEFT 09-30-2008  . TRANSTHORACIC ECHOCARDIOGRAM  10-21-2009   LVEF 50%    No Known Allergies  Current Outpatient Prescriptions  Medication Sig Dispense Refill  . acetaminophen (TYLENOL) 500 MG tablet Take 1,000 mg by mouth every 6 (six) hours as needed (for pain).    Marland Kitchen atorvastatin (LIPITOR) 40 MG tablet TAKE ONE (1) TABLET EACH DAY 90 tablet 2  . beta carotene w/minerals (OCUVITE) tablet Take 1 tablet by mouth daily.    . carvedilol (COREG) 12.5 MG tablet TAKE 1/2 TABLET BY MOUTH TWICE DAILY WITH A MEAL (Patient taking differently: TAKE 1/2 TABLET (6.25 MG )BY MOUTH TWICE DAILY WITH A MEAL) 90 tablet 0  . glipiZIDE (GLUCOTROL  XL) 5 MG 24 hr tablet Take 1 tablet (5 mg total) by mouth daily with breakfast. 90 tablet 3  . hydrochlorothiazide (HYDRODIURIL) 25 MG tablet TAKE ONE (1) TABLET EACH DAY 90 tablet 2  . HYDROcodone-acetaminophen (NORCO) 5-325 MG tablet Take 1 tablet by mouth every 6 (six) hours as needed for moderate pain. 30 tablet 0  . metFORMIN (GLUCOPHAGE) 1000 MG tablet Take 1 tablet (1,000 mg total) by mouth 2 (two) times daily. 180 tablet 3  . Multiple Vitamin (MULTIVITAMIN WITH MINERALS) TABS tablet Take 1 tablet by mouth daily.    . ondansetron (ZOFRAN) 4 MG tablet Take  1 tablet (4 mg total) by mouth every 8 (eight) hours as needed for nausea or vomiting. 20 tablet 0  . traMADol (ULTRAM) 50 MG tablet Take 50 mg by mouth 3 (three) times daily as needed. For back pain    . triamcinolone cream (KENALOG) 0.1 % Apply 1 application topically 2 (two) times daily. (Patient taking differently: Apply 1 application topically 2 (two) times daily as needed (for rash on feet). ) 30 g 0   No current facility-administered medications for this visit.      REVIEW OF SYSTEMS: See HPI for pertinent positives and negatives.  Physical Examination Vitals:   08/17/16 0908 08/17/16 0910  BP: 125/78 126/80  Pulse: (!) 56   Resp: 18   Temp: (!) 97.3 F (36.3 C)   TempSrc: Oral   SpO2: 97%   Weight: 246 lb (111.6 kg)   Height: 5\' 11"  (1.803 m)    Body mass index is 34.31 kg/m.  General:  WDWN obese male in NAD Gait: Normal HENT: WNL Eyes: PERRLA Pulmonary: normal non-labored breathing, adequate air movement, no rales, rhonchi, or wheezing Cardiac: Regular rhythm, bradycardic (on a beta blocker), no murmur detected  Abdomen: soft, NT, reducible asymptomatic ventral hernia Skin: no ulcers, no cellulitis, no rash.  VASCULAR EXAM  Carotid Bruits Left Right   Negative Negative    Radial pulses are 2+ palpable and =  Abdominal aortic pulse is not palpable, obese abdomen.    VASCULAR EXAM: Extremities without ischemic changes  without Gangrene; without open wounds, onchomycosis of toenails. Capillary refill of warm pink toes is 5 seconds.      LE Pulses LEFT RIGHT   FEMORAL 2+ palpable 2+ palpable    POPLITEAL 1+ palpable  2+ palpable   POSTERIOR TIBIAL 1+ palpable  2+ palpable    DORSALIS PEDIS  ANTERIOR TIBIAL not palpable  1+ palpable     Musculoskeletal: no muscle wasting or atrophy; no peripheral edema Neurologic: A&O X 3; appropriate affect ; MOTOR FUNCTION: 5/5 Symmetric, CN 2-12 intact except for some hearing loss. Speech is fluent/normal     ASSESSMENT:  [name] is a 82 y.o. male whom we are following for carotid artery occlusive disease and a small aortic aneurysm. He has a known occlusion of his left internal carotid artery, we are monitoring the right ICA. He had an embolism in the right eye about 2003 or 2005, no subsequent stoke or TIA. He has no back or abdominal pain referable to AAA. He has no claudication symptoms with walking.  Prominent popliteal pulses: will check bilateral popliteal artery duplex on his return in a year.   His atherosclerotic risk factors include almost in control DM, former smoker, and obesity. He takes a statin and ASA.  Fortunately his blood pressure remains in good control.   DATA  Carotid Duplex (08/17/16): Known right internal carotid artery occlusion. 1-39% left internal carotid artery stenosis. Bilateral vertebral artery flow is antegrade.  Bilateral subclavian artery waveforms are normal.  No significant change noted when compared to the previous exams on 07/29/14 and 08/05/15.   AAA Duplex (08/17/16): Aneurysmal dilatation of the distal abdominal aorta with a maximum  diameter of 3.8 cm, Right CIA: 1.7 cm; Left CIA: 1.6 cm Previous AAA (08/05/15): 3.44 cm 4 mm increase in size of AAA compared to a year ago. Normal diameters of bilateral common iliac arteries.    PLAN:   Based on today's exam and non-invasive vascular lab results, the patient will follow up  in 1 year with the following tests: AAA Duplex, carotid Duplex, and bilateral popliteal artery duplex.  I discussed in depth with the patient the nature of atherosclerosis, and emphasized the importance of maximal medical management including strict control of blood pressure, blood glucose, and lipid levels, obtaining regular exercise, and cessation of smoking.  The patient is aware that without maximal medical management the underlying atherosclerotic disease process will progress, limiting the benefit of any interventions.  Consideration for repair of AAA would be made when the size approaches 4.8 or 5.0 cm, growth > 1 cm/yr, and symptomatic status. The patient was given information about AAA including signs, symptoms, treatment,  what symptoms should prompt the patient to seek immediate medical care, and how to minimize the risk of enlargement and rupture of aneurysms.   The patient was given information about stroke prevention and what symptoms should prompt the patient to seek immediate medical care.  The patient was given information about PAD including signs, symptoms, treatment, what symptoms should prompt the patient to seek immediate medical care, and risk reduction measures to take.  Thank you for allowing [place] to participate in this patient's care.  Clemon Chambers, RN, MSN, FNP-C [facility] Office: [phone]  Clinic MD: Scot Dock 08/17/2016 9:26 AM

## 2016-08-17 NOTE — Patient Instructions (Signed)
Stroke Prevention Some medical conditions and behaviors are associated with an increased chance of having a stroke. You may prevent a stroke by making healthy choices and managing medical conditions. How can I reduce my risk of having a stroke?  Stay physically active. Get at least 30 minutes of activity on most or all days.  Do not smoke. It may also be helpful to avoid exposure to secondhand smoke.  Limit alcohol use. Moderate alcohol use is considered to be: ? No more than 2 drinks per day for men. ? No more than 1 drink per day for nonpregnant women.  Eat healthy foods. This involves: ? Eating 5 or more servings of fruits and vegetables a day. ? Making dietary changes that address high blood pressure (hypertension), high cholesterol, diabetes, or obesity.  Manage your cholesterol levels. ? Making food choices that are high in fiber and low in saturated fat, trans fat, and cholesterol may control cholesterol levels. ? Take any prescribed medicines to control cholesterol as directed by your health care provider.  Manage your diabetes. ? Controlling your carbohydrate and sugar intake is recommended to manage diabetes. ? Take any prescribed medicines to control diabetes as directed by your health care provider.  Control your hypertension. ? Making food choices that are low in salt (sodium), saturated fat, trans fat, and cholesterol is recommended to manage hypertension. ? Ask your health care provider if you need treatment to lower your blood pressure. Take any prescribed medicines to control hypertension as directed by your health care provider. ? If you are 18-39 years of age, have your blood pressure checked every 3-5 years. If you are 40 years of age or older, have your blood pressure checked every year.  Maintain a healthy weight. ? Reducing calorie intake and making food choices that are low in sodium, saturated fat, trans fat, and cholesterol are recommended to manage  weight.  Stop drug abuse.  Avoid taking birth control pills. ? Talk to your health care provider about the risks of taking birth control pills if you are over 35 years old, smoke, get migraines, or have ever had a blood clot.  Get evaluated for sleep disorders (sleep apnea). ? Talk to your health care provider about getting a sleep evaluation if you snore a lot or have excessive sleepiness.  Take medicines only as directed by your health care provider. ? For some people, aspirin or blood thinners (anticoagulants) are helpful in reducing the risk of forming abnormal blood clots that can lead to stroke. If you have the irregular heart rhythm of atrial fibrillation, you should be on a blood thinner unless there is a good reason you cannot take them. ? Understand all your medicine instructions.  Make sure that other conditions (such as anemia or atherosclerosis) are addressed. Get help right away if:  You have sudden weakness or numbness of the face, arm, or leg, especially on one side of the body.  Your face or eyelid droops to one side.  You have sudden confusion.  You have trouble speaking (aphasia) or understanding.  You have sudden trouble seeing in one or both eyes.  You have sudden trouble walking.  You have dizziness.  You have a loss of balance or coordination.  You have a sudden, severe headache with no known cause.  You have new chest pain or an irregular heartbeat. Any of these symptoms may represent a serious problem that is an emergency. Do not wait to see if the symptoms will go away.   Get medical help at once. Call your local emergency services (911 in [place] Do not drive yourself to the hospital. This information is not intended to replace advice given to you by your health care provider. Make sure you discuss any questions you have with your health care provider. Document Released: 03/03/2004 Document Revised: 07/02/2015 Document Reviewed: 07/27/2012 Elsevier  Interactive Patient Education  2017 Elsevier Inc.     Preventing Cerebrovascular Disease Arteries are blood vessels that carry blood that contains oxygen from the heart to all parts of the body. Cerebrovascular disease affects arteries that supply the brain. Any condition that blocks or disrupts blood flow to the brain can cause cerebrovascular disease. Brain cells that lose blood supply start to die within minutes (stroke). Stroke is the main danger of cerebrovascular disease. Atherosclerosis and high blood pressure are common causes of cerebrovascular disease. Atherosclerosis is narrowing and hardening of an artery that results when fat, cholesterol, calcium, or other substances (plaque) build up inside an artery. Plaque reduces blood flow through the artery. High blood pressure increases the risk of bleeding inside the brain. Making diet and lifestyle changes to prevent atherosclerosis and high blood pressure lowers your risk of cerebrovascular disease. What nutrition changes can be made?  Eat more fruits, vegetables, and whole grains.  Reduce how much saturated fat you eat. To do this, eat less red meat and fewer full-fat dairy products.  Eat healthy proteins instead of red meat. Healthy proteins include: ? Fish. Eat fish that contains heart-healthy omega-3 fatty acids, twice a week. Examples include salmon, albacore tuna, mackerel, and herring. ? Chicken. ? Nuts. ? Low-fat or nonfat yogurt.  Avoid processed meats, like bacon and lunchmeat.  Avoid foods that contain: ? A lot of sugar, such as sweets and drinks with added sugar. ? A lot of salt (sodium). Avoid adding extra salt to your food, as told by your health care provider. ? Trans fats, such as margarine and baked goods. Trans fats may be listed as "partially hydrogenated oils" on food labels.  Check food labels to see how much sodium, sugar, and trans fats are in foods.  Use vegetable oils that contain low amounts of  saturated fat, such as olive oil or canola oil. What lifestyle changes can be made?  Drink alcohol in moderation. This means no more than 1 drink a day for nonpregnant women and 2 drinks a day for men. One drink equals 12 oz of beer, 5 oz of wine, or 1 oz of hard liquor.  If you are overweight, ask your health care provider to recommend a weight-loss plan for you. Losing 5-10 lb (2.2-4.5 kg) can reduce your risk of diabetes, atherosclerosis, and high blood pressure.  Exercise for 30?60 minutes on most days, or as much as told by your health care provider. ? Do moderate-intensity exercise, such as brisk walking, bicycling, and water aerobics. Ask your health care provider which activities are safe for you.  Do not use any products that contain nicotine or tobacco, such as cigarettes and e-cigarettes. If you need help quitting, ask your health care provider. Why are these changes important? Making these changes lowers your risk of many diseases that can cause cerebrovascular disease and stroke. Stroke is a leading cause of death and disability. Making these changes also improves your overall health and quality of life. What can I do to lower my risk? The following factors make you more likely to develop cerebrovascular disease:  Being overweight.  Smoking.  Being physically inactive.    Eating a high-fat diet.  Having certain health conditions, such as: ? Diabetes. ? High blood pressure. ? Heart disease. ? Atherosclerosis. ? High cholesterol. ? Sickle cell disease.  Talk with your health care provider about your risk for cerebrovascular disease. Work with your health care provider to control diseases that you have that may contribute to cerebrovascular disease. Your health care provider may prescribe medicines to help prevent major causes of cerebrovascular disease. Where to find more information: Learn more about preventing cerebrovascular disease from:  [place], Lung, and  [place]: MoAnalyst.de  Centers for Disease Control and Prevention: http://www.curry-wood.biz/  Summary  Cerebrovascular disease can lead to a stroke.  Atherosclerosis and high blood pressure are major causes of cerebrovascular disease.  Making diet and lifestyle changes can reduce your risk of cerebrovascular disease.  Work with your health care provider to get your risk factors under control to reduce your risk of cerebrovascular disease. This information is not intended to replace advice given to you by your health care provider. Make sure you discuss any questions you have with your health care provider. Document Released: 02/08/2015 Document Revised: 08/14/2015 Document Reviewed: 02/08/2015 Elsevier Interactive Patient Education  2018 [employer].     Abdominal Aortic Aneurysm Blood pumps away from the heart through tubes (blood vessels) called arteries. Aneurysms are weak or damaged places in the wall of an artery. It bulges out like a balloon. An abdominal aortic aneurysm happens in the main artery of the body (aorta). It can burst or tear, causing bleeding inside the body. This is an emergency. It needs treatment right away. What are the causes? The exact cause is unknown. Things that could cause this problem include:  Fat and other substances building up in the lining of a tube.  Swelling of the walls of a blood vessel.  Certain tissue diseases.  Belly (abdominal) trauma.  An infection in the main artery of the body.  What increases the risk? There are things that make it more likely for you to have an aneurysm. These include:  Being over the age of 80 years old.  Having high blood pressure (hypertension).  Being a male.  Being white.  Being very overweight (obese).  Having a family history of aneurysm.  Using tobacco products.  What are the signs or symptoms? Symptoms depend on the size of the aneurysm and  how fast it grows. There may not be symptoms. If symptoms occur, they can include:  Pain (belly, side, lower back, or groin).  Feeling full after eating a small amount of food.  Feeling sick to your stomach (nauseous), throwing up (vomiting), or both.  Feeling a lump in your belly that feels like it is beating (pulsating).  Feeling like you will pass out (faint).  How is this treated?  Medicine to control blood pressure and pain.  Imaging tests to see if the aneurysm gets bigger.  Surgery. How is this prevented? To lessen your chance of getting this condition:  Stop smoking. Stop chewing tobacco.  Limit or avoid alcohol.  Keep your blood pressure, blood sugar, and cholesterol within normal limits.  Eat less salt.  Eat foods low in saturated fats and cholesterol. These are found in animal and whole dairy products.  Eat more fiber. Fiber is found in whole grains, vegetables, and fruits.  Keep a healthy weight.  Stay active and exercise often.  This information is not intended to replace advice given to you by your health care provider. Make sure you  discuss any questions you have with your health care provider. Document Released: 05/21/2012 Document Revised: 07/02/2015 Document Reviewed: 02/24/2012 Elsevier Interactive Patient Education  2017 [employer].

## 2016-09-02 NOTE — Addendum Note (Signed)
Addended by: Lianne Cure A on: 09/02/2016 04:39 PM   Modules accepted: Orders

## 2016-09-22 ENCOUNTER — Other Ambulatory Visit: Payer: Self-pay | Admitting: Internal Medicine

## 2016-12-09 DIAGNOSIS — E119 Type 2 diabetes mellitus without complications: Secondary | ICD-10-CM | POA: Diagnosis not present

## 2016-12-09 DIAGNOSIS — I119 Hypertensive heart disease without heart failure: Secondary | ICD-10-CM | POA: Diagnosis not present

## 2016-12-09 DIAGNOSIS — I6523 Occlusion and stenosis of bilateral carotid arteries: Secondary | ICD-10-CM | POA: Diagnosis not present

## 2016-12-09 DIAGNOSIS — E669 Obesity, unspecified: Secondary | ICD-10-CM | POA: Diagnosis not present

## 2016-12-09 DIAGNOSIS — I714 Abdominal aortic aneurysm, without rupture: Secondary | ICD-10-CM | POA: Diagnosis not present

## 2016-12-09 DIAGNOSIS — E7849 Other hyperlipidemia: Secondary | ICD-10-CM | POA: Diagnosis not present

## 2016-12-09 DIAGNOSIS — I429 Cardiomyopathy, unspecified: Secondary | ICD-10-CM | POA: Diagnosis not present

## 2016-12-12 DIAGNOSIS — Z23 Encounter for immunization: Secondary | ICD-10-CM | POA: Diagnosis not present

## 2017-01-18 DIAGNOSIS — R69 Illness, unspecified: Secondary | ICD-10-CM | POA: Diagnosis not present

## 2017-01-19 ENCOUNTER — Ambulatory Visit: Payer: Medicare HMO | Admitting: Internal Medicine

## 2017-01-19 DIAGNOSIS — E119 Type 2 diabetes mellitus without complications: Secondary | ICD-10-CM | POA: Diagnosis not present

## 2017-01-19 DIAGNOSIS — H35372 Puckering of macula, left eye: Secondary | ICD-10-CM | POA: Diagnosis not present

## 2017-01-19 DIAGNOSIS — Z961 Presence of intraocular lens: Secondary | ICD-10-CM | POA: Diagnosis not present

## 2017-01-19 DIAGNOSIS — H34211 Partial retinal artery occlusion, right eye: Secondary | ICD-10-CM | POA: Diagnosis not present

## 2017-01-19 DIAGNOSIS — H353122 Nonexudative age-related macular degeneration, left eye, intermediate dry stage: Secondary | ICD-10-CM | POA: Diagnosis not present

## 2017-01-19 DIAGNOSIS — H353113 Nonexudative age-related macular degeneration, right eye, advanced atrophic without subfoveal involvement: Secondary | ICD-10-CM | POA: Diagnosis not present

## 2017-01-20 ENCOUNTER — Ambulatory Visit: Payer: Medicare HMO | Admitting: Internal Medicine

## 2017-01-25 ENCOUNTER — Ambulatory Visit: Payer: Medicare HMO | Admitting: Internal Medicine

## 2017-02-06 ENCOUNTER — Other Ambulatory Visit (INDEPENDENT_AMBULATORY_CARE_PROVIDER_SITE_OTHER): Payer: Medicare HMO

## 2017-02-06 ENCOUNTER — Encounter: Payer: Self-pay | Admitting: Internal Medicine

## 2017-02-06 ENCOUNTER — Ambulatory Visit: Payer: Medicare HMO | Admitting: Internal Medicine

## 2017-02-06 VITALS — BP 110/68 | HR 59 | Temp 97.5°F | Ht 71.0 in | Wt 258.0 lb

## 2017-02-06 DIAGNOSIS — I10 Essential (primary) hypertension: Secondary | ICD-10-CM

## 2017-02-06 DIAGNOSIS — E119 Type 2 diabetes mellitus without complications: Secondary | ICD-10-CM

## 2017-02-06 DIAGNOSIS — E785 Hyperlipidemia, unspecified: Secondary | ICD-10-CM | POA: Diagnosis not present

## 2017-02-06 DIAGNOSIS — R202 Paresthesia of skin: Secondary | ICD-10-CM

## 2017-02-06 LAB — BASIC METABOLIC PANEL
BUN: 20 mg/dL (ref 6–23)
CHLORIDE: 97 meq/L (ref 96–112)
CO2: 29 meq/L (ref 19–32)
Calcium: 9.3 mg/dL (ref 8.4–10.5)
Creatinine, Ser: 1.09 mg/dL (ref 0.40–1.50)
GFR: 69.17 mL/min (ref >60.00)
Glucose, Bld: 245 mg/dL — ABNORMAL HIGH (ref 70–99)
POTASSIUM: 4.5 meq/L (ref 3.5–5.1)
SODIUM: 135 meq/L (ref 135–145)

## 2017-02-06 LAB — LIPID PANEL
CHOL/HDL RATIO: 3
CHOLESTEROL: 144 mg/dL (ref 0–200)
HDL: 41.3 mg/dL (ref >39.00)
NONHDL: 102.42
TRIGLYCERIDES: 220 mg/dL — AB (ref 0.0–149.0)
VLDL: 44 mg/dL — ABNORMAL HIGH (ref 0.0–40.0)

## 2017-02-06 LAB — HEPATIC FUNCTION PANEL
ALBUMIN: 4.4 g/dL (ref 3.5–5.2)
ALT: 36 U/L (ref 0–53)
AST: 23 U/L (ref 0–37)
Alkaline Phosphatase: 101 U/L (ref 39–117)
Bilirubin, Direct: 0.1 mg/dL (ref 0.0–0.3)
TOTAL PROTEIN: 7.7 g/dL (ref 6.0–8.3)
Total Bilirubin: 0.9 mg/dL (ref 0.2–1.2)

## 2017-02-06 LAB — LDL CHOLESTEROL, DIRECT: Direct LDL: 92 mg/dL

## 2017-02-06 LAB — HEMOGLOBIN A1C: Hgb A1c MFr Bld: 7.9 % — ABNORMAL HIGH (ref 4.6–6.5)

## 2017-02-06 MED ORDER — METFORMIN HCL 1000 MG PO TABS: 1000.0000 mg | ORAL_TABLET | Freq: Two times a day (BID) | ORAL | 3 refills | Status: DC

## 2017-02-06 MED ORDER — ATORVASTATIN CALCIUM 40 MG PO TABS: ORAL_TABLET | ORAL | 3 refills | Status: DC

## 2017-02-06 MED ORDER — GLIPIZIDE ER 5 MG PO TB24: 5.0000 mg | ORAL_TABLET | Freq: Every day | ORAL | 3 refills | Status: DC

## 2017-02-06 MED ORDER — HYDROCHLOROTHIAZIDE 25 MG PO TABS: ORAL_TABLET | ORAL | 3 refills | Status: DC

## 2017-02-06 MED ORDER — CARVEDILOL 12.5 MG PO TABS: ORAL_TABLET | ORAL | 3 refills | Status: DC

## 2017-02-06 MED ORDER — ZOSTER VAC RECOMB ADJUVANTED 50 MCG/0.5ML IM SUSR: 0.5000 mL | Freq: Once | INTRAMUSCULAR | 1 refills | Status: AC

## 2017-02-06 NOTE — Assessment & Plan Note (Signed)
stable overall by history and exam, recent data reviewed with pt, and pt to continue medical treatment as before,  to f/u any worsening symptoms or concerns, for f/u lab today 

## 2017-02-06 NOTE — Progress Notes (Signed)
Subjective:    Patient ID: [name], male    DOB: August 26, 1934, 82 y.o.   MRN: [id number]  HPI  Here to f/u; overall doing ok,  Pt denies chest pain, increasing sob or doe, wheezing, orthopnea, PND, increased LE swelling, palpitations, dizziness or syncope.  Pt denies new neurological symptoms such as new headache, or facial or extremity weakness but has mild worsening feet numbness not currently seeing podiatry.  Pt denies polydipsia, polyuria, or low sugar episode.  Pt states overall good compliance with meds, mostly trying to follow appropriate diet, with wt overall stable,  but little exercise however. Had urinary rbc's with kidney stone about last visit, and no gross hematuria since.  Denies urinary symptoms such as dysuria, frequency, urgency, flank pain, hematuria or n/v, fever, chills.  Has  Past Medical History:  Diagnosis Date  . AAA (abdominal aortic aneurysm) ([place])    MONITORED BY DR LAWSON LOV JUNE 2014--  STABLE  AT 3.7  . Allergic rhinitis   . Arthritis   . Asymptomatic stenosis of left carotid artery without infarction    MILD DISEASE  . BPH (benign prostatic hypertrophy)   . Diverticulosis of colon   . History of cardiomyopathy    IDIOPATHIC-- PER [facility] NOTE 2013 , CURRENTLY RESOLVED  . History of kidney stones   . Hyperlipidemia   . Hypertension   . Peripheral vascular disease ([place])   . Phimosis   . Right carotid artery occlusion    CHRONIC   WITHOUT INFARTION  . Stroke [facility])     Right eye  . Type 2 diabetes mellitus ([place])   . Wears hearing aid    Past Surgical History:  Procedure Laterality Date  . CARDIOVASCULAR STRESS TEST  JUNE 2008  DR Wynonia Lawman  . CATARACT EXTRACTION W/ INTRAOCULAR LENS IMPLANT Right   . CIRCUMCISION N/A 08/30/2012   Procedure: CIRCUMCISION ADULT;  Surgeon: Malka So, MD;  Location: [facility];  Service: Urology;  Laterality: N/A;  . CYSTO/  RIGHT URETERAL STENT PLACEMENT  10-16-2003  . EXTRACORPOREAL SHOCK WAVE  LITHOTRIPSY Right 2005  . EYE SURGERY    . JOINT REPLACEMENT Bilateral 2008  2009   Knee  . LUMBAR LAMINECTOMY/DECOMPRESSION MICRODISCECTOMY Bilateral 06/08/2016   Procedure: Bilateral Microlumbar decompression L4-L5 and L5-S1;  Surgeon: Susa Day, MD;  Location: [facility];  Service: Orthopedics;  Laterality: Bilateral;  Requests 2 hours  . TOTAL KNEE ARTHROPLASTY Bilateral RIGHT 10-08-2007;   LEFT 09-30-2008  . TRANSTHORACIC ECHOCARDIOGRAM  10-21-2009   LVEF 50%    reports that he quit smoking about 34 years ago. His smoking use included cigarettes. He has a 18.75 pack-year smoking history. he has never used smokeless tobacco. He reports that he does not drink alcohol or use drugs. family history includes ALS in his father; Asthma in his mother; COPD in his mother; Depression in his mother; Diabetes in his father; Heart disease in his mother; Hypertension in his mother; Kidney disease in his mother. No Known Allergies Current Outpatient Medications on File Prior to Visit  Medication Sig Dispense Refill  . acetaminophen (TYLENOL) 500 MG tablet Take 1,000 mg by mouth every 6 (six) hours as needed (for pain).    Marland Kitchen beta carotene w/minerals (OCUVITE) tablet Take 1 tablet by mouth daily.    Marland Kitchen HYDROcodone-acetaminophen (NORCO) 5-325 MG tablet Take 1 tablet by mouth every 6 (six) hours as needed for moderate pain. 30 tablet 0  . Multiple Vitamin (MULTIVITAMIN WITH MINERALS) TABS tablet  Take 1 tablet by mouth daily.    . ondansetron (ZOFRAN) 4 MG tablet Take 1 tablet (4 mg total) by mouth every 8 (eight) hours as needed for nausea or vomiting. 20 tablet 0  . traMADol (ULTRAM) 50 MG tablet Take 50 mg by mouth 3 (three) times daily as needed. For back pain    . triamcinolone cream (KENALOG) 0.1 % Apply 1 application topically 2 (two) times daily. (Patient taking differently: Apply 1 application topically 2 (two) times daily as needed (for rash on feet). ) 30 g 0   No current facility-administered  medications on file prior to visit.    Review of Systems  Constitutional: Negative for other unusual diaphoresis or sweats HENT: Negative for ear discharge or swelling Eyes: Negative for other worsening visual disturbances Respiratory: Negative for stridor or other swelling  Gastrointestinal: Negative for worsening distension or other blood Genitourinary: Negative for retention or other urinary change Musculoskeletal: Negative for other MSK pain or swelling Skin: Negative for color change or other new lesions Neurological: Negative for worsening tremors and other numbness  Psychiatric/Behavioral: Negative for worsening agitation or other fatigue All other system neg per pt    Objective:   Physical Exam BP 110/68 (BP Location: Left Arm, Patient Position: Sitting, Cuff Size: Large)   Pulse (!) 59   Temp (!) 97.5 F (36.4 C) (Oral)   Ht 5\' 11"  (1.803 m)   Wt 258 lb (117 kg)   SpO2 99%   BMI 35.98 kg/m  VS noted,  Constitutional: Pt appears in NAD HENT: Head: NCAT.  Right Ear: External ear normal.  Left Ear: External ear normal.  Eyes: . Pupils are equal, round, and reactive to light. Conjunctivae and EOM are normal Nose: without d/c or deformity Neck: Neck supple. Gross normal ROM Cardiovascular: Normal rate and regular rhythm.   Pulmonary/Chest: Effort normal and breath sounds without rales or wheezing.  Neurological: Pt is alert. At baseline orientation, motor grossly intact, has bilat feet reduced sens to LT Skin: Skin is warm. No rashes, other new lesions, no LE edema Psychiatric: Pt behavior is normal without agitation  No other exam findings      Assessment & Plan:

## 2017-02-06 NOTE — Assessment & Plan Note (Signed)
stable overall by history and exam, recent data reviewed with pt, and pt to continue medical treatment as before,  to f/u any worsening symptoms or concerns,  BP Readings from Last 3 Encounters:  02/06/17 110/68  08/17/16 126/80  07/28/16 (!) 159/82

## 2017-02-06 NOTE — Assessment & Plan Note (Signed)
Lab Results  Component Value Date   LDLCALC 81 07/14/2016  stable overall by history and exam, recent data reviewed with pt, and pt to continue medical treatment as before,  to f/u any worsening symptoms or concerns, goal < 70, for f/u lab today

## 2017-02-06 NOTE — Assessment & Plan Note (Signed)
Likely neuropathy, ok for podiatry referral,  to f/u any worsening symptoms or concerns

## 2017-02-06 NOTE — Patient Instructions (Addendum)
Your shingles shot was sent to the pharmacy  Please continue all other medications as before, and refills have been done if requested.  Please have the pharmacy call with any other refills you may need.  Please continue your efforts at being more active, low cholesterol diet, and weight control.  You are otherwise up to date with prevention measures today.  Please keep your appointments with your specialists as you may have planned  You will be contacted regarding the referral for: Podiatry (foot doctor)  Please go to the LAB in the Basement (turn left off the elevator) for the tests to be done today  You will be contacted by phone if any changes need to be made immediately.  Otherwise, you will receive a letter about your results with an explanation, but please check with MyChart first.  Please remember to sign up for MyChart if you have not done so, as this will be important to you in the future with finding out test results, communicating by private email, and scheduling acute appointments online when needed.  Please return in 6 months, or sooner if needed, with Lab testing done 3-5 days before

## 2017-02-08 ENCOUNTER — Telehealth: Payer: Self-pay

## 2017-02-08 ENCOUNTER — Other Ambulatory Visit: Payer: Self-pay | Admitting: Internal Medicine

## 2017-02-08 MED ORDER — GLIPIZIDE ER 10 MG PO TB24: 10.0000 mg | ORAL_TABLET | Freq: Every day | ORAL | 3 refills | Status: DC

## 2017-02-08 NOTE — Telephone Encounter (Signed)
-----   Message from Biagio Borg, MD sent at 02/08/2017  1:44 PM EST ----- Left message on MyChart, pt to cont same tx except  The test results show that your current treatment is OK, except the A1c is too high.  We need to increase the glipzide ER to 10 mg per day.  I will send a new prescription and you should hear from the office as well.   Shirron to please inform pt, I will do rx

## 2017-02-08 NOTE — Telephone Encounter (Signed)
Called pt, LVM.   CRM created.  

## 2017-02-22 ENCOUNTER — Ambulatory Visit: Payer: Self-pay | Admitting: *Deleted

## 2017-02-22 MED ORDER — OSELTAMIVIR PHOSPHATE 75 MG PO CAPS: 75.0000 mg | ORAL_CAPSULE | Freq: Two times a day (BID) | ORAL | 0 refills | Status: DC

## 2017-02-22 NOTE — Telephone Encounter (Signed)
Pt has been informed.

## 2017-02-22 NOTE — Telephone Encounter (Signed)
Ok for tamiflu asd - done erx

## 2017-02-22 NOTE — Telephone Encounter (Signed)
Pt called stating that his wife was diagnosed with the flu on 02/21/17 and that she had not been feeling well for a few days prior; he was instructed to contact his primary physician to see if something could be called in for him; spoke with Gareth Eagle at [facility] and she will forward this request to Dr Cathlean Cower; pt states that he uses [facility] in [place], [place]; the pt can be contacted at   [phone]. Reason for Disposition . [1] Influenza EXPOSURE (Close Contact) within last 72 hours (3 days) AND [2] exposed person is HIGH RISK (e.g., age > 72 years, pregnant, HIV+, chronic medical condition)  Answer Assessment - Initial Assessment Questions 1. TYPE of EXPOSURE: "How were you exposed?" (e.g., close contact, not a close contact)     His wife 2. DATE of EXPOSURE: "When did the exposure occur?" (e.g., hour, days, weeks)     Wife diagnosed on 02/21/17 at [address] [facility] 3. PREGNANCY: "Is there any chance you are pregnant?" "When was your last menstrual period?"     n/a 4. HIGH RISK for COMPLICATIONS: "Do you have any heart or lung problems? Do you have a weakened immune system?" (e.g., CHF, COPD, asthma, HIV positive, chemotherapy, renal failure, diabetes mellitus, sickle cell anemia)     DM 5. SYMPTOMS: "Do you have any symptoms?" (e.g., cough, fever, sore throat, difficulty breathing).     no  Protocols used: INFLUENZA EXPOSURE-A-AH

## 2017-02-22 NOTE — Addendum Note (Signed)
Addended by: Biagio Borg on: 02/22/2017 12:47 PM   Modules accepted: Orders

## 2017-07-20 DIAGNOSIS — H353122 Nonexudative age-related macular degeneration, left eye, intermediate dry stage: Secondary | ICD-10-CM | POA: Diagnosis not present

## 2017-07-20 DIAGNOSIS — H34211 Partial retinal artery occlusion, right eye: Secondary | ICD-10-CM | POA: Diagnosis not present

## 2017-07-20 DIAGNOSIS — H353113 Nonexudative age-related macular degeneration, right eye, advanced atrophic without subfoveal involvement: Secondary | ICD-10-CM | POA: Diagnosis not present

## 2017-07-20 DIAGNOSIS — H35372 Puckering of macula, left eye: Secondary | ICD-10-CM | POA: Diagnosis not present

## 2017-07-21 DIAGNOSIS — R69 Illness, unspecified: Secondary | ICD-10-CM | POA: Diagnosis not present

## 2017-08-30 ENCOUNTER — Other Ambulatory Visit (HOSPITAL_COMMUNITY): Payer: Medicare HMO

## 2017-08-30 ENCOUNTER — Encounter (HOSPITAL_COMMUNITY): Payer: Medicare HMO

## 2017-08-30 ENCOUNTER — Ambulatory Visit: Payer: Medicare HMO | Admitting: Family

## 2017-08-31 ENCOUNTER — Ambulatory Visit (INDEPENDENT_AMBULATORY_CARE_PROVIDER_SITE_OTHER)
Admission: RE | Admit: 2017-08-31 | Discharge: 2017-08-31 | Disposition: A | Discharge status: Home / Self Care | Payer: Medicare HMO | Source: Ambulatory Visit | Attending: Family | Admitting: Family

## 2017-08-31 ENCOUNTER — Other Ambulatory Visit (INDEPENDENT_AMBULATORY_CARE_PROVIDER_SITE_OTHER): Payer: Medicare HMO

## 2017-08-31 ENCOUNTER — Ambulatory Visit (HOSPITAL_COMMUNITY)
Admission: RE | Admit: 2017-08-31 | Discharge: 2017-08-31 | Disposition: A | Discharge status: Home / Self Care | Payer: Medicare HMO | Source: Ambulatory Visit | Attending: Family | Admitting: Family

## 2017-08-31 ENCOUNTER — Ambulatory Visit: Payer: Medicare HMO | Admitting: Family

## 2017-08-31 ENCOUNTER — Other Ambulatory Visit: Payer: Self-pay

## 2017-08-31 ENCOUNTER — Encounter: Payer: Self-pay | Admitting: Family

## 2017-08-31 VITALS — BP 131/76 | HR 51 | Temp 97.5°F | Resp 16 | Ht 71.0 in | Wt 266.1 lb

## 2017-08-31 DIAGNOSIS — I6523 Occlusion and stenosis of bilateral carotid arteries: Secondary | ICD-10-CM | POA: Diagnosis not present

## 2017-08-31 DIAGNOSIS — I714 Abdominal aortic aneurysm, without rupture, unspecified: Secondary | ICD-10-CM

## 2017-08-31 DIAGNOSIS — I6521 Occlusion and stenosis of right carotid artery: Secondary | ICD-10-CM | POA: Diagnosis not present

## 2017-08-31 DIAGNOSIS — E785 Hyperlipidemia, unspecified: Secondary | ICD-10-CM | POA: Diagnosis not present

## 2017-08-31 DIAGNOSIS — I6522 Occlusion and stenosis of left carotid artery: Secondary | ICD-10-CM

## 2017-08-31 DIAGNOSIS — E669 Obesity, unspecified: Secondary | ICD-10-CM | POA: Diagnosis not present

## 2017-08-31 DIAGNOSIS — E119 Type 2 diabetes mellitus without complications: Secondary | ICD-10-CM | POA: Diagnosis not present

## 2017-08-31 DIAGNOSIS — R0789 Other chest pain: Secondary | ICD-10-CM | POA: Diagnosis not present

## 2017-08-31 DIAGNOSIS — R0989 Other specified symptoms and signs involving the circulatory and respiratory systems: Secondary | ICD-10-CM

## 2017-08-31 DIAGNOSIS — I10 Essential (primary) hypertension: Secondary | ICD-10-CM | POA: Diagnosis not present

## 2017-08-31 LAB — HEMOGLOBIN A1C: HEMOGLOBIN A1C: 8.2 % — AB (ref 4.6–6.5)

## 2017-08-31 LAB — CBC WITH DIFFERENTIAL/PLATELET
BASOS ABS: 0 10*3/uL (ref 0.0–0.1)
Basophils Relative: 0.6 % (ref 0.0–3.0)
EOS ABS: 0.3 10*3/uL (ref 0.0–0.7)
Eosinophils Relative: 4.3 % (ref 0.0–5.0)
HCT: 39.9 % (ref 39.0–52.0)
Hemoglobin: 13.8 g/dL (ref 13.0–17.0)
LYMPHS ABS: 2.1 10*3/uL (ref 0.7–4.0)
Lymphocytes Relative: 28.7 % (ref 12.0–46.0)
MCHC: 34.7 g/dL (ref 30.0–36.0)
MCV: 92.8 fl (ref 78.0–100.0)
MONO ABS: 0.4 10*3/uL (ref 0.1–1.0)
MONOS PCT: 5.6 % (ref 3.0–12.0)
NEUTROS PCT: 60.8 % (ref 43.0–77.0)
Neutro Abs: 4.5 10*3/uL (ref 1.4–7.7)
PLATELETS: 155 10*3/uL (ref 150.0–400.0)
RBC: 4.3 Mil/uL (ref 4.22–5.81)
RDW: 14.3 % (ref 11.5–15.5)
WBC: 7.4 10*3/uL (ref 4.0–10.5)

## 2017-08-31 LAB — URINALYSIS, ROUTINE W REFLEX MICROSCOPIC
BILIRUBIN URINE: NEGATIVE
HGB URINE DIPSTICK: NEGATIVE
KETONES UR: NEGATIVE
Leukocytes, UA: NEGATIVE
Nitrite: NEGATIVE
Specific Gravity, Urine: 1.025 (ref 1.000–1.030)
Urine Glucose: NEGATIVE
Urobilinogen, UA: 0.2 (ref 0.0–1.0)
pH: 6 (ref 5.0–8.0)

## 2017-08-31 LAB — LIPID PANEL
CHOLESTEROL: 130 mg/dL (ref 0–200)
HDL: 39.3 mg/dL (ref >39.00)
LDL CALC: 56 mg/dL (ref 0–99)
NonHDL: 90.93
Total CHOL/HDL Ratio: 3
Triglycerides: 177 mg/dL — ABNORMAL HIGH (ref 0.0–149.0)
VLDL: 35.4 mg/dL (ref 0.0–40.0)

## 2017-08-31 LAB — BASIC METABOLIC PANEL
BUN: 19 mg/dL (ref 6–23)
CALCIUM: 9.4 mg/dL (ref 8.4–10.5)
CHLORIDE: 99 meq/L (ref 96–112)
CO2: 30 mEq/L (ref 19–32)
CREATININE: 1.08 mg/dL (ref 0.40–1.50)
GFR: 69.81 mL/min (ref >60.00)
Glucose, Bld: 182 mg/dL — ABNORMAL HIGH (ref 70–99)
POTASSIUM: 3.9 meq/L (ref 3.5–5.1)
SODIUM: 138 meq/L (ref 135–145)

## 2017-08-31 LAB — MICROALBUMIN / CREATININE URINE RATIO
Creatinine,U: 174.6 mg/dL
MICROALB UR: 1.6 mg/dL (ref 0.0–1.9)
Microalb Creat Ratio: 0.9 mg/g (ref 0.0–30.0)

## 2017-08-31 LAB — TSH: TSH: 2.89 u[IU]/mL (ref 0.35–4.50)

## 2017-08-31 LAB — HEPATIC FUNCTION PANEL
ALBUMIN: 4.3 g/dL (ref 3.5–5.2)
ALT: 33 U/L (ref 0–53)
AST: 18 U/L (ref 0–37)
Alkaline Phosphatase: 74 U/L (ref 39–117)
BILIRUBIN DIRECT: 0.1 mg/dL (ref 0.0–0.3)
TOTAL PROTEIN: 7.6 g/dL (ref 6.0–8.3)
Total Bilirubin: 0.9 mg/dL (ref 0.2–1.2)

## 2017-08-31 NOTE — Progress Notes (Signed)
[facility] [place] HISTORY AND PHYSICAL   CC: Follow up extracranial carotid artery stenosis and AAA   History of Present Illness:   [name] is a 83 y.o. male whom Dr. Kellie Simmering had been following for carotid artery occlusive disease and a small aortic aneurysm. He has a known occlusion of his right internal carotid artery, we are monitoring the right ICA. He returns today for follow up. 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 He denies abdominal pain.  He had surgery at L4-5 about May 2018, states this has helped his right leg radiculopathy and weakness, he is performing exercises to strengthen his leg.  Pt states he has tingling in the toes of both feet.  He denies claudication symptoms in legs with walking, denies non healing wounds. He sees a podiatrist to trim his thick toenails.  He takes a daily ASA, daily statin, and a beta blocker, denies taking any anticoagulants, states he has cardiomyopathy, denies any history of MI.  He is being treated for rash on his lower legs by a dermatologist, pt states rash is improving.   Diabetic: Yes, 02-06-17 A1C was 7.9 (review of records) Tobacco use: former smoker, quit about 1985  He is a recovering alcoholic.   Pt meds include: Statin :Yes Betablocker: Yes ASA: Yes Other anticoagulants/antiplatelets: no   Current Outpatient Medications  Medication Sig Dispense Refill  . acetaminophen (TYLENOL) 500 MG tablet Take 1,000 mg by mouth every 6 (six) hours as needed (for pain).    Marland Kitchen atorvastatin (LIPITOR) 40 MG tablet TAKE ONE (1) TABLET EACH DAY 90 tablet 3  . beta carotene w/minerals (OCUVITE) tablet Take 1 tablet by mouth daily.     . carvedilol (COREG) 12.5 MG tablet TAKE 1/2 TABLET TWICE DAILY WITH A MEAL 90 tablet 3  . glipiZIDE (GLUCOTROL XL) 10 MG 24 hr tablet Take 1 tablet (10 mg total) by mouth daily with breakfast. 90 tablet 3  . hydrochlorothiazide (HYDRODIURIL) 25 MG tablet TAKE ONE (1) TABLET EACH DAY 90 tablet 3  . HYDROcodone-acetaminophen (NORCO) 5-325 MG tablet Take 1 tablet by mouth every 6 (six) hours as needed for moderate pain. 30 tablet 0  . metFORMIN (GLUCOPHAGE) 1000 MG tablet Take 1 tablet (1,000 mg total) by mouth 2 (two) times daily. 180 tablet 3  . Multiple Vitamin (MULTIVITAMIN WITH MINERALS) TABS tablet Take 1 tablet by mouth daily.    . ondansetron (ZOFRAN) 4 MG tablet Take 1 tablet (4 mg total) by mouth every 8 (eight) hours as needed for nausea or vomiting. (Patient not taking: Reported on 08/31/2017) 20 tablet 0  . oseltamivir (TAMIFLU) 75 MG capsule Take 1 capsule (75 mg total) by mouth 2 (two) times daily. (Patient not taking: Reported on 08/31/2017) 10 capsule 0  . traMADol (ULTRAM) 50 MG tablet Take 50 mg by mouth 3 (three) times daily as needed. For back pain    . triamcinolone cream (KENALOG) 0.1 % Apply 1 application topically 2 (two) times daily. (Patient taking differently: Apply 1 application topically 2 (two) times daily as needed (for rash on feet). ) 30 g 0   No current facility-administered medications for this visit.     Past Medical History:  Diagnosis  Date  . AAA (abdominal aortic aneurysm) ([place])    MONITORED BY DR LAWSON LOV JUNE 2014--  STABLE  AT 3.7  . Allergic rhinitis   . Arthritis   . Asymptomatic stenosis of left carotid artery without infarction    MILD DISEASE  . BPH (benign prostatic hypertrophy)   . Diverticulosis of colon   . History of cardiomyopathy    IDIOPATHIC-- PER [facility] NOTE 2013 , CURRENTLY RESOLVED  . History of kidney stones   . Hyperlipidemia   . Hypertension   . Peripheral vascular disease ([place])   . Phimosis   . Right carotid artery  occlusion    CHRONIC   WITHOUT INFARTION  . Stroke [facility])     Right eye  . Type 2 diabetes mellitus ([place])   . Wears hearing aid     Social History Social History   Tobacco Use  . Smoking status: Former Smoker    Packs/day: 0.75    Years: 25.00    Pack years: 18.75    Types: Cigarettes    Last attempt to quit: 07/25/1982    Years since quitting: 35.1  . Smokeless tobacco: Never Used  Substance Use Topics  . Alcohol use: No    Alcohol/week: 0.0 oz    Comment: recovering alcoholic  [id number]  . Drug use: No    Family History Family History  Problem Relation Age of Onset  . Heart disease Mother        CHF  Before age 26  . COPD Mother   . Kidney disease Mother   . Hypertension Mother   . Asthma Mother   . Depression Mother   . ALS Father   . Diabetes Father   . Colon cancer Neg Hx     Surgical History Past Surgical History:  Procedure Laterality Date  . CARDIOVASCULAR STRESS TEST  JUNE 2008  DR Wynonia Lawman  . CATARACT EXTRACTION W/ INTRAOCULAR LENS IMPLANT Right   . CIRCUMCISION N/A 08/30/2012   Procedure: CIRCUMCISION ADULT;  Surgeon: Malka So, MD;  [facility];  Service: Urology;  Laterality: N/A;  . CYSTO/  RIGHT URETERAL STENT PLACEMENT  10-16-2003  . EXTRACORPOREAL SHOCK WAVE LITHOTRIPSY Right 2005  . EYE SURGERY    . JOINT REPLACEMENT Bilateral 2008  2009   Knee  . LUMBAR LAMINECTOMY/DECOMPRESSION MICRODISCECTOMY Bilateral 06/08/2016   Procedure: Bilateral Microlumbar decompression L4-L5 and L5-S1;  Surgeon: Susa Day, MD;  Location: [facility];  Service: Orthopedics;  Laterality: Bilateral;  Requests 2 hours  . TOTAL KNEE ARTHROPLASTY Bilateral RIGHT 10-08-2007;   LEFT 09-30-2008  . TRANSTHORACIC ECHOCARDIOGRAM  10-21-2009   LVEF 50%    No Known Allergies  Current Outpatient Medications  Medication Sig Dispense Refill  . acetaminophen (TYLENOL) 500 MG tablet Take 1,000 mg by mouth every 6 (six) hours as needed (for pain).    Marland Kitchen  atorvastatin (LIPITOR) 40 MG tablet TAKE ONE (1) TABLET EACH DAY 90 tablet 3  . beta carotene w/minerals (OCUVITE) tablet Take 1 tablet by mouth daily.    . carvedilol (COREG) 12.5 MG tablet TAKE 1/2 TABLET TWICE DAILY WITH A MEAL 90 tablet 3  . glipiZIDE (GLUCOTROL XL) 10 MG 24 hr tablet Take 1 tablet (10 mg total) by mouth daily with breakfast. 90 tablet 3  . hydrochlorothiazide (HYDRODIURIL) 25 MG tablet TAKE ONE (1) TABLET EACH DAY 90 tablet 3  . HYDROcodone-acetaminophen (NORCO) 5-325 MG tablet Take 1 tablet by mouth every 6 (six) hours as needed for  moderate pain. 30 tablet 0  . metFORMIN (GLUCOPHAGE) 1000 MG tablet Take 1 tablet (1,000 mg total) by mouth 2 (two) times daily. 180 tablet 3  . Multiple Vitamin (MULTIVITAMIN WITH MINERALS) TABS tablet Take 1 tablet by mouth daily.    . ondansetron (ZOFRAN) 4 MG tablet Take 1 tablet (4 mg total) by mouth every 8 (eight) hours as needed for nausea or vomiting. (Patient not taking: Reported on 08/31/2017) 20 tablet 0  . oseltamivir (TAMIFLU) 75 MG capsule Take 1 capsule (75 mg total) by mouth 2 (two) times daily. (Patient not taking: Reported on 08/31/2017) 10 capsule 0  . traMADol (ULTRAM) 50 MG tablet Take 50 mg by mouth 3 (three) times daily as needed. For back pain    . triamcinolone cream (KENALOG) 0.1 % Apply 1 application topically 2 (two) times daily. (Patient taking differently: Apply 1 application topically 2 (two) times daily as needed (for rash on feet). ) 30 g 0   No current facility-administered medications for this visit.      REVIEW OF SYSTEMS: See HPI for pertinent positives and negatives.  Physical Examination Vitals:   08/31/17 1003 08/31/17 1005  BP: 137/75 131/76  Pulse: (!) 51   Resp: 16   Temp: (!) 97.5 F (36.4 C)   TempSrc: Oral   SpO2: 99%   Weight: 266 lb 1.6 oz (120.7 kg)   Height: 5\' 11"  (1.803 m)    Body mass index is 37.11 kg/m.  General:  WDWN obese male in NAD Gait: Normal HENT: WNL Eyes: Pupils  equal Pulmonary: normal non-labored breathing, good air movement, CTAB, without rales, rhonchi, or wheezing Cardiac: Regular rhythm, bradycardic (on a beta blocker), no murmur detected Abdomen: soft, NT, no masses palpated Skin:  no ulcers, no cellulitis.+ papular prutiitc red rash medial aspect right ankle.    VASCULAR EXAM  Carotid Bruits Right Left   Negative Negative      Radial pulses are 2+ palpable bilaterally   Adominal aortic pulse is not palpable                      VASCULAR EXAM: Extremities without ischemic changes, without Gangrene; without open wounds. Onchomycosis of toenails. Capillary refill of all toes is 5 seconds, warm and pink.                                                                                                           LE Pulses Right Left       FEMORAL  2+ palpable  2+ palpable        POPLITEAL  1+ palpable   not palpable       POSTERIOR TIBIAL  2+ palpable   not palpable        DORSALIS PEDIS      ANTERIOR TIBIAL not palpable  not palpable     Musculoskeletal: no muscle wasting or atrophy; no peripheral edema  Neurologic:  A&O X 3; appropriate affect, sensation is normal; speech is normal, CN 2-12 intact except is hard of hearing,  pain and light touch intact in extremities, motor exam as listed above. Psychiatric: Normal thought content, mood appropriate to clinical situation.      ASSESSMENT:  [name] is a 83 y.o. male whom we are following for carotid artery occlusive disease and a small aortic aneurysm.  He has a known occlusion of his left internal carotid artery, we are monitoring the right ICA. He had an embolism in the right eye about 2003 or 2005, no subsequent stoke or TIA. He has no back or abdominal pain referable to AAA. He has no claudication symptoms with walking.  Prominent popliteal pulses: no popliteal artery aneurysm on duplex (08-31-17).  His atherosclerotic risk factors include almost in control DM,  former smoker, and obesity. He takes a statin and ASA.  Fortunately his blood pressure remains in good control.  Non plapble left pedal pulses with no claudication sx's, no signs of ischemia, will check ABI's on his return.    DATA  Carotid Duplex (08-31-17): Right ICA: confirmed occlusion Left ICA: 40-59% stenosis Bilateral vertebral artery flow is antegrade.  Bilateral subclavian artery waveforms are normal.  Increased stenosis in the left ICA compared to the exam on 08-17-16.   Bilateral Popliteal Artery Duplex (08-31-17): Normal bilateral popliteal artery diameters, no aneurysms.    AAA Duplex: Previous: Aneurysmal dilatation of the distal abdominal aorta with a maximum diameter of 3.8 cm, Right CIA: 1.7 cm; Left CIA: 1.6 cm Current: Largest aortic diameter is 3.49 cm at the mid portion. Right CIA: 1.2 cm; Left CIA: 1.2 cm. Limited visualization due to overlying bowel gas. Stable small AAA.    PLAN:   Based on today's exam and non-invasive vascular lab results, the patient will follow up in 1 year with the following tests: AAA Duplex, carotid Duplex, and ABI's.   I discussed in depth with the patient the nature of atherosclerosis, and emphasized the importance of maximal medical management including strict control of blood pressure, blood glucose, and lipid levels, obtaining regular exercise, and cessation of smoking.  The patient is aware that without maximal medical management the underlying atherosclerotic disease process will progress, limiting the benefit of any interventions.  Consideration for repair of AAA would be made when the size approaches 5.0 cm, growth > 1 cm/yr, and symptomatic status. The patient was given information about AAA including signs, symptoms, treatment,  what symptoms should prompt the patient to seek immediate medical care, and how to minimize the risk of enlargement and rupture of aneurysms.   The patient was given information about stroke  prevention and what symptoms should prompt the patient to seek immediate medical care.  Thank you for allowing [place] to participate in this patient's care.  Clemon Chambers, RN, MSN, FNP-C [facility] Office: [phone]  Clinic MD: Trula Slade 08/31/2017 10:25 AM

## 2017-08-31 NOTE — Patient Instructions (Signed)
Before your next abdominal ultrasound:  Avoid gas forming foods and beverages the day before the test.   Take two Extra-Strength Gas-X capsules at bedtime the night before the test. Take another two Extra-Strength Gas-X capsules in the middle of the night if you get up to the restroom, if not, first thing in the morning with water.       Abdominal Aortic Aneurysm Blood pumps away from the heart through tubes (blood vessels) called arteries. Aneurysms are weak or damaged places in the wall of an artery. It bulges out like a balloon. An abdominal aortic aneurysm happens in the main artery of the body (aorta). It can burst or tear, causing bleeding inside the body. This is an emergency. It needs treatment right away. What are the causes? The exact cause is unknown. Things that could cause this problem include:  Fat and other substances building up in the lining of a tube.  Swelling of the walls of a blood vessel.  Certain tissue diseases.  Belly (abdominal) trauma.  An infection in the main artery of the body.  What increases the risk? There are things that make it more likely for you to have an aneurysm. These include:  Being over the age of 81 years old.  Having high blood pressure (hypertension).  Being a male.  Being white.  Being very overweight (obese).  Having a family history of aneurysm.  Using tobacco products.  What are the signs or symptoms? Symptoms depend on the size of the aneurysm and how fast it grows. There may not be symptoms. If symptoms occur, they can include:  Pain (belly, side, lower back, or groin).  Feeling full after eating a small amount of food.  Feeling sick to your stomach (nauseous), throwing up (vomiting), or both.  Feeling a lump in your belly that feels like it is beating (pulsating).  Feeling like you will pass out (faint).  How is this treated?  Medicine to control blood pressure and pain.  Imaging tests to see if the  aneurysm gets bigger.  Surgery. How is this prevented? To lessen your chance of getting this condition:  Stop smoking. Stop chewing tobacco.  Limit or avoid alcohol.  Keep your blood pressure, blood sugar, and cholesterol within normal limits.  Eat less salt.  Eat foods low in saturated fats and cholesterol. These are found in animal and whole dairy products.  Eat more fiber. Fiber is found in whole grains, vegetables, and fruits.  Keep a healthy weight.  Stay active and exercise often.  This information is not intended to replace advice given to you by your health care provider. Make sure you discuss any questions you have with your health care provider. Document Released: 05/21/2012 Document Revised: 07/02/2015 Document Reviewed: 02/24/2012 Elsevier Interactive Patient Education  2017 [employer].     Stroke Prevention Some health problems and behaviors may make it more likely for you to have a stroke. Below are ways to lessen your risk of having a stroke.  Be active for at least 30 minutes on most or all days.  Do not smoke. Try not to be around others who smoke.  Do not drink too much alcohol. ? Do not have more than 2 drinks a day if you are a man. ? Do not have more than 1 drink a day if you are a woman and are not pregnant.  Eat healthy foods, such as fruits and vegetables. If you were put on a specific diet, follow the  diet as told.  Keep your cholesterol levels under control through diet and medicines. Look for foods that are low in saturated fat, trans fat, cholesterol, and are high in fiber.  If you have diabetes, follow all diet plans and take your medicine as told.  Ask your doctor if you need treatment to lower your blood pressure. If you have high blood pressure (hypertension), follow all diet plans and take your medicine as told by your doctor.  If you are 29-37 years old, have your blood pressure checked every 3-5 years. If you are age 37 or older,  have your blood pressure checked every year.  Keep a healthy weight. Eat foods that are low in calories, salt, saturated fat, trans fat, and cholesterol.  Do not take drugs.  Avoid birth control pills, if this applies. Talk to your doctor about the risks of taking birth control pills.  Talk to your doctor if you have sleep problems (sleep apnea).  Take all medicine as told by your doctor. ? You may be told to take aspirin or blood thinner medicine. Take this medicine as told by your doctor. ? Understand your medicine instructions.  Make sure any other conditions you have are being taken care of.  Get help right away if:  You suddenly lose feeling (you feel numb) or have weakness in your face, arm, or leg.  Your face or eyelid hangs down to one side.  You suddenly feel confused.  You have trouble talking (aphasia) or understanding what people are saying.  You suddenly have trouble seeing in one or both eyes.  You suddenly have trouble walking.  You are dizzy.  You lose your balance or your movements are clumsy (uncoordinated).  You suddenly have a very bad headache and you do not know the cause.  You have new chest pain.  Your heart feels like it is fluttering or skipping a beat (irregular heartbeat). Do not wait to see if the symptoms above go away. Get help right away. Call your local emergency services (911 in [place] Do not drive yourself to the hospital. This information is not intended to replace advice given to you by your health care provider. Make sure you discuss any questions you have with your health care provider. Document Released: 07/26/2011 Document Revised: 07/02/2015 Document Reviewed: 07/27/2012 Elsevier Interactive Patient Education  [employer].

## 2017-09-05 ENCOUNTER — Encounter: Payer: Self-pay | Admitting: Internal Medicine

## 2017-09-05 ENCOUNTER — Ambulatory Visit (INDEPENDENT_AMBULATORY_CARE_PROVIDER_SITE_OTHER): Payer: Medicare HMO | Admitting: Internal Medicine

## 2017-09-05 VITALS — BP 126/84 | HR 60 | Temp 98.0°F | Ht 71.0 in | Wt 263.0 lb

## 2017-09-05 DIAGNOSIS — E119 Type 2 diabetes mellitus without complications: Secondary | ICD-10-CM | POA: Diagnosis not present

## 2017-09-05 DIAGNOSIS — I2510 Atherosclerotic heart disease of native coronary artery without angina pectoris: Secondary | ICD-10-CM | POA: Insufficient documentation

## 2017-09-05 DIAGNOSIS — Z0000 Encounter for general adult medical examination without abnormal findings: Secondary | ICD-10-CM

## 2017-09-05 HISTORY — DX: Atherosclerotic heart disease of native coronary artery without angina pectoris: I25.10

## 2017-09-05 MED ORDER — EMPAGLIFLOZIN 10 MG PO TABS: 10.0000 mg | ORAL_TABLET | Freq: Every day | ORAL | 3 refills | Status: DC

## 2017-09-05 MED ORDER — CLOBETASOL PROPIONATE 0.05 % EX OINT: 1.0000 "application " | TOPICAL_OINTMENT | Freq: Two times a day (BID) | CUTANEOUS | 0 refills | Status: DC

## 2017-09-05 NOTE — Progress Notes (Signed)
Subjective:    Patient ID: [name], male    DOB: 1934-07-21, 83 y.o.   MRN: [id number]  HPI  Here for wellness and f/u;  Overall doing ok;  Pt denies Chest pain, worsening SOB, DOE, wheezing, orthopnea, PND, worsening LE edema, palpitations, dizziness or syncope.  Pt denies neurological change such as new headache, facial or extremity weakness.  Pt denies polydipsia, polyuria, or low sugar symptoms. Pt states overall good compliance with treatment and medications, good tolerability, and has been trying to follow appropriate diet.  Pt denies worsening depressive symptoms, suicidal ideation or panic. No fever, night sweats, wt loss, loss of appetite, or other constitutional symptoms.  Pt states good ability with ADL's, has low fall risk, home safety reviewed and adequate, no other significant changes in hearing or vision, and only occasionally active with exercise.  Does have a rash to medial right distal leg that responded before to clobetasol oint per derm, asks for refill. Past Medical History:  Diagnosis Date  . AAA (abdominal aortic aneurysm) ([place])    MONITORED BY DR LAWSON LOV JUNE 2014--  STABLE  AT 3.7  . Allergic rhinitis   . Arthritis   . Asymptomatic stenosis of left carotid artery without infarction    MILD DISEASE  . BPH (benign prostatic hypertrophy)   . Diverticulosis of colon   . History of cardiomyopathy    IDIOPATHIC-- PER [facility] NOTE 2013 , CURRENTLY RESOLVED  . History of kidney stones   . Hyperlipidemia   . Hypertension   . Peripheral vascular disease ([place])   . Phimosis   . Right carotid artery occlusion    CHRONIC   WITHOUT INFARTION  . Stroke [employer])     Right eye  . Type 2 diabetes mellitus ([place])   . Wears hearing aid    Past Surgical History:  Procedure Laterality Date  . CARDIOVASCULAR STRESS TEST  JUNE 2008  DR Wynonia Lawman  . CATARACT EXTRACTION W/ INTRAOCULAR LENS IMPLANT Right   . CIRCUMCISION N/A 08/30/2012   Procedure: CIRCUMCISION ADULT;  Surgeon:  Malka So, MD;  Location: [facility];  Service: Urology;  Laterality: N/A;  . CYSTO/  RIGHT URETERAL STENT PLACEMENT  10-16-2003  . EXTRACORPOREAL SHOCK WAVE LITHOTRIPSY Right 2005  . EYE SURGERY    . JOINT REPLACEMENT Bilateral 2008  2009   Knee  . LUMBAR LAMINECTOMY/DECOMPRESSION MICRODISCECTOMY Bilateral 06/08/2016   Procedure: Bilateral Microlumbar decompression L4-L5 and L5-S1;  Surgeon: Susa Day, MD;  Location: [facility];  Service: Orthopedics;  Laterality: Bilateral;  Requests 2 hours  . TOTAL KNEE ARTHROPLASTY Bilateral RIGHT 10-08-2007;   LEFT 09-30-2008  . TRANSTHORACIC ECHOCARDIOGRAM  10-21-2009   LVEF 50%    reports that he quit smoking about 35 years ago. His smoking use included cigarettes. He has a 18.75 pack-year smoking history. He has never used smokeless tobacco. He reports that he does not drink alcohol or use drugs. family history includes ALS in his father; Asthma in his mother; COPD in his mother; Depression in his mother; Diabetes in his father; Heart disease in his mother; Hypertension in his mother; Kidney disease in his mother. No Known Allergies Current Outpatient Medications on File Prior to Visit  Medication Sig Dispense Refill  . atorvastatin (LIPITOR) 40 MG tablet TAKE ONE (1) TABLET EACH DAY 90 tablet 3  . beta carotene w/minerals (OCUVITE) tablet Take 1 tablet by mouth daily.    . carvedilol (COREG) 12.5 MG tablet TAKE 1/2 TABLET TWICE DAILY  WITH A MEAL 90 tablet 3  . glipiZIDE (GLUCOTROL XL) 10 MG 24 hr tablet Take 1 tablet (10 mg total) by mouth daily with breakfast. 90 tablet 3  . hydrochlorothiazide (HYDRODIURIL) 25 MG tablet TAKE ONE (1) TABLET EACH DAY 90 tablet 3  . HYDROcodone-acetaminophen (NORCO) 5-325 MG tablet Take 1 tablet by mouth every 6 (six) hours as needed for moderate pain. 30 tablet 0  . metFORMIN (GLUCOPHAGE) 1000 MG tablet Take 1 tablet (1,000 mg total) by mouth 2 (two) times daily. 180 tablet 3  . Multiple Vitamin  (MULTIVITAMIN WITH MINERALS) TABS tablet Take 1 tablet by mouth daily.    . traMADol (ULTRAM) 50 MG tablet Take 50 mg by mouth 3 (three) times daily as needed. For back pain     No current facility-administered medications on file prior to visit.    Review of Systems Constitutional: Negative for other unusual diaphoresis, sweats, appetite or weight changes HENT: Negative for other worsening hearing loss, ear pain, facial swelling, mouth sores or neck stiffness.   Eyes: Negative for other worsening pain, redness or other visual disturbance.  Respiratory: Negative for other stridor or swelling Cardiovascular: Negative for other palpitations or other chest pain  Gastrointestinal: Negative for worsening diarrhea or loose stools, blood in stool, distention or other pain Genitourinary: Negative for hematuria, flank pain or other change in urine volume.  Musculoskeletal: Negative for myalgias or other joint swelling.  Skin: Negative for other color change, or other wound or worsening drainage.  Neurological: Negative for other syncope or numbness. Hematological: Negative for other adenopathy or swelling Psychiatric/Behavioral: Negative for hallucinations, other worsening agitation, SI, self-injury, or new decreased concentration All other system neg per pt    Objective:   Physical Exam BP 126/84   Pulse 60   Temp 98 F (36.7 C) (Oral)   Ht 5\' 11"  (1.803 m)   Wt 263 lb (119.3 kg)   SpO2 96%   BMI 36.68 kg/m  VS noted, obese Constitutional: Pt is oriented to person, place, and time. Appears well-developed and well-nourished, in no significant distress and comfortable Head: Normocephalic and atraumatic  Eyes: Conjunctivae and EOM are normal. Pupils are equal, round, and reactive to light Right Ear: External ear normal without discharge Left Ear: External ear normal without discharge Nose: Nose without discharge or deformity Mouth/Throat: Oropharynx is without other ulcerations and moist    Neck: Normal range of motion. Neck supple. No JVD present. No tracheal deviation present or significant neck LA or mass Cardiovascular: Normal rate, regular rhythm, normal heart sounds and intact distal pulses.   Pulmonary/Chest: WOB normal and breath sounds without rales or wheezing  Abdominal: Soft. Bowel sounds are normal. NT. No HSM  Musculoskeletal: Normal range of motion. Exhibits no edema Lymphadenopathy: Has no other cervical adenopathy.  Neurological: Pt is alert and oriented to person, place, and time. Pt has normal reflexes. No cranial nerve deficit. Motor grossly intact, Gait intact Skin: Skin is warm and dry. No rash noted or new ulcerations Psychiatric:  Has normal mood and affect. Behavior is normal without agitation No other exam findings Lab Results  Component Value Date   WBC 7.4 08/31/2017   HGB 13.8 08/31/2017   HCT 39.9 08/31/2017   PLT 155.0 08/31/2017   GLUCOSE 182 (H) 08/31/2017   CHOL 130 08/31/2017   TRIG 177.0 (H) 08/31/2017   HDL 39.30 08/31/2017   LDLDIRECT 92.0 02/06/2017   LDLCALC 56 08/31/2017   ALT 33 08/31/2017   AST 18 08/31/2017  NA 138 08/31/2017   K 3.9 08/31/2017   CL 99 08/31/2017   CREATININE 1.08 08/31/2017   BUN 19 08/31/2017   CO2 30 08/31/2017   TSH 2.89 08/31/2017   PSA 2.15 07/14/2016   INR 1.1 09/24/2008   HGBA1C 8.2 (H) 08/31/2017   MICROALBUR 1.6 08/31/2017        Assessment & Plan:

## 2017-09-05 NOTE — Patient Instructions (Signed)
Please take all new medication as prescribed - the jardiance  Please continue all other medications as before, and refills have been done if requested.  Please have the pharmacy call with any other refills you may need.  Please continue your efforts at being more active, low cholesterol diet, and weight control.  You are otherwise up to date with prevention measures today.  Please keep your appointments with your specialists as you may have planned  Please return in 6 months, or sooner if needed, with Lab testing done 3-5 days before  

## 2017-09-05 NOTE — Assessment & Plan Note (Addendum)
Uncontrolled, overall by history and exam, recent data reviewed with pt, and pt to continue medical treatment as before,  to f/u any worsening symptoms or concerns, for jardiance 10 qd

## 2017-09-05 NOTE — Assessment & Plan Note (Signed)

## 2017-11-02 DIAGNOSIS — Z23 Encounter for immunization: Secondary | ICD-10-CM | POA: Diagnosis not present

## 2017-12-12 ENCOUNTER — Encounter: Payer: Self-pay | Admitting: Cardiology

## 2017-12-12 ENCOUNTER — Ambulatory Visit: Payer: Medicare HMO | Admitting: Cardiology

## 2017-12-12 VITALS — BP 120/70 | HR 59 | Ht 71.0 in | Wt 259.1 lb

## 2017-12-12 DIAGNOSIS — E119 Type 2 diabetes mellitus without complications: Secondary | ICD-10-CM

## 2017-12-12 DIAGNOSIS — E782 Mixed hyperlipidemia: Secondary | ICD-10-CM | POA: Diagnosis not present

## 2017-12-12 DIAGNOSIS — I6523 Occlusion and stenosis of bilateral carotid arteries: Secondary | ICD-10-CM

## 2017-12-12 DIAGNOSIS — I2510 Atherosclerotic heart disease of native coronary artery without angina pectoris: Secondary | ICD-10-CM | POA: Diagnosis not present

## 2017-12-12 DIAGNOSIS — I10 Essential (primary) hypertension: Secondary | ICD-10-CM

## 2017-12-12 NOTE — Addendum Note (Signed)
Addended by: Ashok Norris on: 12/12/2017 10:34 AM   Modules accepted: Orders

## 2017-12-12 NOTE — Patient Instructions (Signed)
Medication Instructions:  Your physician recommends that you continue on your current medications as directed. Please refer to the Current Medication list given to you today.  If you need a refill on your cardiac medications before your next appointment, please call your pharmacy.   Lab work: None.  If you have labs (blood work) drawn today and your tests are completely normal, you will receive your results only by: . MyChart Message (if you have MyChart) OR . A paper copy in the mail If you have any lab test that is abnormal or we need to change your treatment, we will call you to review the results.  Testing/Procedures: Your physician has requested that you have an echocardiogram. Echocardiography is a painless test that uses sound waves to create images of your heart. It provides your doctor with information about the size and shape of your heart and how well your heart's chambers and valves are working. This procedure takes approximately one hour. There are no restrictions for this procedure.    Follow-Up: At [facility], you and your health needs are our priority.  As part of our continuing mission to provide you with exceptional heart care, we have created designated Provider Care Teams.  These Care Teams include your primary Cardiologist (physician) and Advanced Practice Providers (APPs -  Physician Assistants and Nurse Practitioners) who all work together to provide you with the care you need, when you need it. You will need a follow up appointment in 5 months.  Please call our office 2 months in advance to schedule this appointment.  You may see No primary care provider on file. or another member of our [facility] Provider Team in [place]: Brian Munley, MD . Rajan Revankar, MD  Any Other Special Instructions Will Be Listed Below (If Applicable). Echocardiogram An echocardiogram, or echocardiography, uses sound waves (ultrasound) to produce an image of your heart. The  echocardiogram is simple, painless, obtained within a short period of time, and offers valuable information to your health care provider. The images from an echocardiogram can provide information such as:  Evidence of coronary artery disease (CAD).  Heart size.  Heart muscle function.  Heart valve function.  Aneurysm detection.  Evidence of a past heart attack.  Fluid buildup around the heart.  Heart muscle thickening.  Assess heart valve function.  Tell a health care provider about:  Any allergies you have.  All medicines you are taking, including vitamins, herbs, eye drops, creams, and over-the-counter medicines.  Any problems you or family members have had with anesthetic medicines.  Any blood disorders you have.  Any surgeries you have had.  Any medical conditions you have.  Whether you are pregnant or may be pregnant. What happens before the procedure? No special preparation is needed. Eat and drink normally. What happens during the procedure?  In order to produce an image of your heart, gel will be applied to your chest and a wand-like tool (transducer) will be moved over your chest. The gel will help transmit the sound waves from the transducer. The sound waves will harmlessly bounce off your heart to allow the heart images to be captured in real-time motion. These images will then be recorded.  You may need an IV to receive a medicine that improves the quality of the pictures. What happens after the procedure? You may return to your normal schedule including diet, activities, and medicines, unless your health care provider tells you otherwise. This information is not intended to replace advice   given to you by your health care provider. Make sure you discuss any questions you have with your health care provider. Document Released: 01/22/2000 Document Revised: 09/12/2015 Document Reviewed: 10/01/2012 Elsevier Interactive Patient Education  2017 [employer]    

## 2017-12-12 NOTE — Progress Notes (Signed)
Cardiology Office Note:    Date:  12/12/2017   ID:  [name], DOB 1935/02/05, MRN [id number]  PCP:  Joshua Borg, MD  Cardiologist:  Joshua Campus, MD    Referring MD: Joshua Borg, MD   Chief Complaint  Patient presents with  . Follow-up  Doing well  History of Present Illness:    [name] is a 83 y.o. male with a history of peripheral vascular disease dyslipidemia obesity cardiomyopathy with mildly diminished left ventricular ejection fraction, abdominal aneurysm comes to our office for follow-up overall he seems to be doing well denies having any chest pain tightness squeezing pressure burning chest.  He is taking care of his sick wife who does have leukemia likely she is doing quite well.  He does not exercise on the regular [place] 2 dogs that he walks however he rides a golf cart and dogs are running next to the golf cart.  He understands that he needs to exercise more and promised to do a little bit more exercises.  Past Medical History:  Diagnosis Date  . AAA (abdominal aortic aneurysm) ([place])    MONITORED BY DR [name] JUNE 2014--  STABLE  AT 3.7  . Allergic rhinitis   . Arthritis   . Asymptomatic stenosis of left carotid artery without infarction    MILD DISEASE  . BPH (benign prostatic hypertrophy)   . Diverticulosis of colon   . History of cardiomyopathy    IDIOPATHIC-- PER [facility] NOTE 2013 , CURRENTLY RESOLVED  . History of kidney stones   . Hyperlipidemia   . Hypertension   . Peripheral vascular disease ([place])   . Phimosis   . Right carotid artery occlusion    CHRONIC   WITHOUT INFARTION  . Stroke [facility])     Right eye  . Type 2 diabetes mellitus ([place])   . Wears hearing aid     Past Surgical History:  Procedure Laterality Date  . CARDIOVASCULAR STRESS TEST  JUNE 2008  DR Wynonia Lawman  . CATARACT EXTRACTION W/ INTRAOCULAR LENS IMPLANT Right   . CIRCUMCISION N/A 08/30/2012   Procedure: CIRCUMCISION ADULT;   Surgeon: Malka So, MD;  Location: [facility];  Service: Urology;  Laterality: N/A;  . CYSTO/  RIGHT URETERAL STENT PLACEMENT  10-16-2003  . EXTRACORPOREAL SHOCK WAVE LITHOTRIPSY Right 2005  . EYE SURGERY    . JOINT REPLACEMENT Bilateral 2008  2009   Knee  . LUMBAR LAMINECTOMY/DECOMPRESSION MICRODISCECTOMY Bilateral 06/08/2016   Procedure: Bilateral Microlumbar decompression L4-L5 and L5-S1;  Surgeon: Susa Day, MD;  Location: [facility];  Service: Orthopedics;  Laterality: Bilateral;  Requests 2 hours  . TOTAL KNEE ARTHROPLASTY Bilateral RIGHT 10-08-2007;   LEFT 09-30-2008  . TRANSTHORACIC ECHOCARDIOGRAM  10-21-2009   LVEF 50%    Current Medications: Current Meds  Medication Sig  . atorvastatin (LIPITOR) 40 MG tablet TAKE ONE (1) TABLET EACH DAY  . beta carotene w/minerals (OCUVITE) tablet Take 1 tablet by mouth daily.  . carvedilol (COREG) 12.5 MG tablet TAKE 1/2 TABLET TWICE DAILY WITH A MEAL  . clobetasol ointment (TEMOVATE) 5.62 % Apply 1 application topically 2 (two) times daily.  . empagliflozin (JARDIANCE) 10 MG TABS tablet Take 10 mg by mouth daily.  Marland Kitchen glipiZIDE (GLUCOTROL XL) 10 MG 24 hr tablet Take 1 tablet (10 mg total) by mouth daily with breakfast.  . hydrochlorothiazide (HYDRODIURIL) 25 MG tablet TAKE ONE (1) TABLET EACH  DAY  . HYDROcodone-acetaminophen (NORCO) 5-325 MG tablet Take 1 tablet by mouth every 6 (six) hours as needed for moderate pain.  . metFORMIN (GLUCOPHAGE) 1000 MG tablet Take 1 tablet (1,000 mg total) by mouth 2 (two) times daily.  . Multiple Vitamin (MULTIVITAMIN WITH MINERALS) TABS tablet Take 1 tablet by mouth daily.  . traMADol (ULTRAM) 50 MG tablet Take 50 mg by mouth 3 (three) times daily as needed. For back pain     Allergies:   Patient has no known allergies.   Social History   Socioeconomic History  . Marital status: Married    Spouse name: Not on file  . Number of children: Not on file  . Years of education: Not on file    . Highest education level: Not on file  Occupational History  . 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 Financial resource strain: Not on file  . Food insecurity:    Worry: Not on file    Inability: Not on file  . Transportation needs:    Medical: Not on file    Non-medical: Not on file  Tobacco Use  . Smoking status: Former Smoker    Packs/day: 0.75    Years: 25.00    Pack years: 18.75    Types: Cigarettes    Last attempt to quit: 07/25/1982    Years since quitting: 35.4  . Smokeless tobacco: Never Used  Substance and Sexual Activity  . Alcohol use: No    Alcohol/week: 0.0 standard drinks    Comment: recovering alcoholic  +33ASN  . Drug use: No  . Sexual activity: Not on file  Lifestyle  . Physical activity:    Days per week: Not on file    Minutes per session: Not on file  . Stress: Not on file  Relationships  . Social connections:    Talks on phone: Not on file    Gets together: Not on file    Attends religious service: Not on file    Active member of club or organization: Not on file    Attends meetings of clubs or organizations: Not on file    Relationship status: Not on file  Other Topics Concern  . Not on file  Social History Narrative  . Not on file     Family History: The patient's family history includes ALS in his father; Asthma in his mother; COPD in his mother; Depression in his mother; Diabetes in his father; Heart disease in his mother; Hypertension in his mother; Kidney disease in his mother. There is no history of Colon cancer. ROS:   Please see the history of present illness.    All 14 point review of systems negative except as described per history of present illness  EKGs/Labs/Other Studies Reviewed:      Recent Labs: 08/31/2017: ALT 33; BUN 19; Creatinine, Ser 1.08; Hemoglobin 13.8; Platelets 155.0; Potassium 3.9; Sodium 138; TSH 2.89  Recent Lipid Panel     Component Value Date/Time   CHOL 130 08/31/2017 1132   TRIG 177.0 (H) 08/31/2017 1132   HDL 39.30 08/31/2017 1132   CHOLHDL 3 08/31/2017 1132   VLDL 35.4 08/31/2017 1132   LDLCALC 56 08/31/2017 1132   LDLDIRECT 92.0 02/06/2017 1119    Physical Exam:    VS:  BP 120/70   Pulse (!) 59   Ht 5\' 11"  (1.803 m)   Wt 259 lb 1.9  oz (117.5 kg)   SpO2 97%   BMI 36.14 kg/m     Wt Readings from Last 3 Encounters:  12/12/17 259 lb 1.9 oz (117.5 kg)  09/05/17 263 lb (119.3 kg)  08/31/17 266 lb 1.6 oz (120.7 kg)     GEN:  Well nourished, well developed in no acute distress HEENT: Normal NECK: No JVD; No carotid bruits LYMPHATICS: No lymphadenopathy CARDIAC: RRR, no murmurs, no rubs, no gallops RESPIRATORY:  Clear to auscultation without rales, wheezing or rhonchi  ABDOMEN: Soft, non-tender, non-distended MUSCULOSKELETAL:  No edema; No deformity  SKIN: Warm and dry LOWER EXTREMITIES: no swelling NEUROLOGIC:  Alert and oriented x 3 PSYCHIATRIC:  Normal affect   ASSESSMENT:    1. Diabetes mellitus type 2, noninsulin dependent ([place])   2. Mixed hyperlipidemia   3. Bilateral carotid artery stenosis   4. Essential hypertension    PLAN:    In order of problems listed above:  1. Type 2 diabetes followed by internal medicine team apparently stable. 2. History of cardiomyopathy's time to repeat his echocardiogram last estimation of ejection fraction was done in 2017 which showed ejection fraction 45%.  Again echocardiogram will be done he is on carvedilol which I will continue he is not on ACE inhibitor which I suspect is because of some elevation of creatinine.  But clearly he can benefit from this medication. 3. Bilateral carotid arterial stenosis.  That being followed by vascular surgeon in [place]. 4. Abdominal aortic aneurysm followed by vascular surgery in [place].  Overall he seems to be doing well I encouraged him to exercise a little bit more I will ask him simply to walk  his dogs rather than right a cart.   Medication Adjustments/Labs and Tests Ordered: Current medicines are reviewed at length with the patient today.  Concerns regarding medicines are outlined above.  No orders of the defined types were placed in this encounter.  Medication changes: No orders of the defined types were placed in this encounter.   Signed, Park Liter, MD, [facility] 12/12/2017 10:22 AM    [place]

## 2017-12-20 ENCOUNTER — Ambulatory Visit (HOSPITAL_BASED_OUTPATIENT_CLINIC_OR_DEPARTMENT_OTHER): Admission: RE | Admit: 2017-12-20 | Payer: Medicare HMO | Source: Ambulatory Visit

## 2017-12-22 ENCOUNTER — Other Ambulatory Visit: Payer: Self-pay | Admitting: Internal Medicine

## 2018-01-03 ENCOUNTER — Ambulatory Visit (HOSPITAL_BASED_OUTPATIENT_CLINIC_OR_DEPARTMENT_OTHER)
Admission: RE | Admit: 2018-01-03 | Discharge: 2018-01-03 | Disposition: A | Discharge status: Home / Self Care | Payer: Medicare HMO | Source: Ambulatory Visit | Attending: Cardiology | Admitting: Cardiology

## 2018-01-03 DIAGNOSIS — I6523 Occlusion and stenosis of bilateral carotid arteries: Secondary | ICD-10-CM | POA: Diagnosis not present

## 2018-01-03 DIAGNOSIS — I2510 Atherosclerotic heart disease of native coronary artery without angina pectoris: Secondary | ICD-10-CM

## 2018-01-03 NOTE — Progress Notes (Addendum)
  Echocardiogram 2D Echocardiogram has been performed.   Cardell Peach RDCS, RVT 01/03/2018, 2:59 PM

## 2018-01-03 NOTE — Progress Notes (Signed)
  Echocardiogram 2D Echocardiogram has been performed.  Cardell Peach RDCS, RVT 01/03/2018, 2:58 PM

## 2018-01-29 ENCOUNTER — Other Ambulatory Visit: Payer: Self-pay | Admitting: Internal Medicine

## 2018-02-02 ENCOUNTER — Other Ambulatory Visit: Payer: Self-pay | Admitting: Internal Medicine

## 2018-02-21 DIAGNOSIS — E119 Type 2 diabetes mellitus without complications: Secondary | ICD-10-CM | POA: Diagnosis not present

## 2018-02-21 DIAGNOSIS — H52223 Regular astigmatism, bilateral: Secondary | ICD-10-CM | POA: Diagnosis not present

## 2018-02-21 DIAGNOSIS — H3411 Central retinal artery occlusion, right eye: Secondary | ICD-10-CM | POA: Diagnosis not present

## 2018-02-21 DIAGNOSIS — H353122 Nonexudative age-related macular degeneration, left eye, intermediate dry stage: Secondary | ICD-10-CM | POA: Diagnosis not present

## 2018-02-21 DIAGNOSIS — H524 Presbyopia: Secondary | ICD-10-CM | POA: Diagnosis not present

## 2018-02-21 DIAGNOSIS — H5211 Myopia, right eye: Secondary | ICD-10-CM | POA: Diagnosis not present

## 2018-02-21 DIAGNOSIS — H353113 Nonexudative age-related macular degeneration, right eye, advanced atrophic without subfoveal involvement: Secondary | ICD-10-CM | POA: Diagnosis not present

## 2018-02-21 LAB — HM DIABETES EYE EXAM

## 2018-02-22 ENCOUNTER — Other Ambulatory Visit: Payer: Self-pay | Admitting: Internal Medicine

## 2018-03-07 ENCOUNTER — Other Ambulatory Visit: Payer: Medicare HMO

## 2018-03-09 ENCOUNTER — Other Ambulatory Visit: Payer: Self-pay | Admitting: Internal Medicine

## 2018-03-09 ENCOUNTER — Encounter: Payer: Self-pay | Admitting: Internal Medicine

## 2018-03-09 ENCOUNTER — Other Ambulatory Visit (INDEPENDENT_AMBULATORY_CARE_PROVIDER_SITE_OTHER): Payer: Medicare HMO

## 2018-03-09 ENCOUNTER — Ambulatory Visit (INDEPENDENT_AMBULATORY_CARE_PROVIDER_SITE_OTHER): Payer: Medicare HMO | Admitting: Internal Medicine

## 2018-03-09 VITALS — BP 114/72 | HR 60 | Temp 98.5°F | Ht 71.0 in | Wt 257.0 lb

## 2018-03-09 DIAGNOSIS — E782 Mixed hyperlipidemia: Secondary | ICD-10-CM

## 2018-03-09 DIAGNOSIS — E119 Type 2 diabetes mellitus without complications: Secondary | ICD-10-CM

## 2018-03-09 DIAGNOSIS — I10 Essential (primary) hypertension: Secondary | ICD-10-CM

## 2018-03-09 LAB — BASIC METABOLIC PANEL
BUN: 21 mg/dL (ref 6–23)
CALCIUM: 10 mg/dL (ref 8.4–10.5)
CO2: 29 mEq/L (ref 19–32)
Chloride: 97 mEq/L (ref 96–112)
Creatinine, Ser: 1.1 mg/dL (ref 0.40–1.50)
GFR: 64.22 mL/min (ref >60.00)
Glucose, Bld: 136 mg/dL — ABNORMAL HIGH (ref 70–99)
Potassium: 3.9 mEq/L (ref 3.5–5.1)
Sodium: 136 mEq/L (ref 135–145)

## 2018-03-09 LAB — HEPATIC FUNCTION PANEL
ALT: 40 U/L (ref 0–53)
AST: 25 U/L (ref 0–37)
Albumin: 4.5 g/dL (ref 3.5–5.2)
Alkaline Phosphatase: 92 U/L (ref 39–117)
Bilirubin, Direct: 0.2 mg/dL (ref 0.0–0.3)
Total Bilirubin: 0.9 mg/dL (ref 0.2–1.2)
Total Protein: 7.7 g/dL (ref 6.0–8.3)

## 2018-03-09 LAB — LIPID PANEL
Cholesterol: 146 mg/dL (ref 0–200)
HDL: 41.4 mg/dL (ref >39.00)
LDL Cholesterol: 79 mg/dL (ref 0–99)
NonHDL: 104.65
Total CHOL/HDL Ratio: 4
Triglycerides: 130 mg/dL (ref 0.0–149.0)
VLDL: 26 mg/dL (ref 0.0–40.0)

## 2018-03-09 LAB — HEMOGLOBIN A1C: Hgb A1c MFr Bld: 7.8 % — ABNORMAL HIGH (ref 4.6–6.5)

## 2018-03-09 MED ORDER — EMPAGLIFLOZIN 25 MG PO TABS: 25.0000 mg | ORAL_TABLET | Freq: Every day | ORAL | 3 refills | Status: DC

## 2018-03-09 NOTE — Progress Notes (Signed)
Subjective:    Patient ID: [name], male    DOB: 10-31-1934, 84 y.o.   MRN: [id number]  HPI  Here to f/u; overall doing ok,  Pt denies chest pain, increasing sob or doe, wheezing, orthopnea, PND, increased LE swelling, palpitations, dizziness or syncope.  Pt denies new neurological symptoms such as new headache, or facial or extremity weakness or numbness.  Pt denies polydipsia, polyuria, or low sugar episode.  Pt states overall good compliance with meds, mostly trying to follow appropriate diet, with wt overall stable,  but little exercise however.  Has good compliance with jardiance 10 mg.  Has lost wt with jardiance  CBGs in 150 -180 Wt Readings from Last 3 Encounters:  03/09/18 257 lb (116.6 kg)  12/12/17 259 lb 1.9 oz (117.5 kg)  09/05/17 263 lb (119.3 kg)   Past Medical History:  Diagnosis Date  . AAA (abdominal aortic aneurysm) ([place])    MONITORED BY DR LAWSON LOV JUNE 2014--  STABLE  AT 3.7  . Allergic rhinitis   . Arthritis   . Asymptomatic stenosis of left carotid artery without infarction    MILD DISEASE  . BPH (benign prostatic hypertrophy)   . Diverticulosis of colon   . History of cardiomyopathy    IDIOPATHIC-- PER [facility] NOTE 2013 , CURRENTLY RESOLVED  . History of kidney stones   . Hyperlipidemia   . Hypertension   . Peripheral vascular disease ([place])   . Phimosis   . Right carotid artery occlusion    CHRONIC   WITHOUT INFARTION  . Stroke [facility])     Right eye  . Type 2 diabetes mellitus ([place])   . Wears hearing aid    Past Surgical History:  Procedure Laterality Date  . CARDIOVASCULAR STRESS TEST  JUNE 2008  DR Wynonia Lawman  . CATARACT EXTRACTION W/ INTRAOCULAR LENS IMPLANT Right   . CIRCUMCISION N/A 08/30/2012   Procedure: CIRCUMCISION ADULT;  Surgeon: Malka So, MD;  Location: [facility];  Service: Urology;  Laterality: N/A;  . CYSTO/  RIGHT URETERAL STENT PLACEMENT  10-16-2003  . EXTRACORPOREAL SHOCK WAVE LITHOTRIPSY Right 2005  . EYE  SURGERY    . JOINT REPLACEMENT Bilateral 2008  2009   Knee  . LUMBAR LAMINECTOMY/DECOMPRESSION MICRODISCECTOMY Bilateral 06/08/2016   Procedure: Bilateral Microlumbar decompression L4-L5 and L5-S1;  Surgeon: Susa Day, MD;  Location: [facility];  Service: Orthopedics;  Laterality: Bilateral;  Requests 2 hours  . TOTAL KNEE ARTHROPLASTY Bilateral RIGHT 10-08-2007;   LEFT 09-30-2008  . TRANSTHORACIC ECHOCARDIOGRAM  10-21-2009   LVEF 50%    reports that he quit smoking about 35 years ago. His smoking use included cigarettes. He has a 18.75 pack-year smoking history. He has never used smokeless tobacco. He reports that he does not drink alcohol or use drugs. family history includes ALS in his father; Asthma in his mother; COPD in his mother; Depression in his mother; Diabetes in his father; Heart disease in his mother; Hypertension in his mother; Kidney disease in his mother. No Known Allergies Current Outpatient Medications on File Prior to Visit  Medication Sig Dispense Refill  . atorvastatin (LIPITOR) 40 MG tablet TAKE ONE (1) TABLET BY MOUTH EACH DAY 90 tablet 1  . beta carotene w/minerals (OCUVITE) tablet Take 1 tablet by mouth daily.    . carvedilol (COREG) 12.5 MG tablet TAKE 1/2 TABLET TWICE DAILY WITH A MEAL 90 tablet 1  . clobetasol ointment (TEMOVATE) 7.09 % Apply 1 application topically 2 (two)  times daily. 60 g 0  . empagliflozin (JARDIANCE) 10 MG TABS tablet Take 10 mg by mouth daily. 90 tablet 3  . glipiZIDE (GLUCOTROL XL) 10 MG 24 hr tablet Take 1 tablet (10 mg total) by mouth daily with breakfast. 90 tablet 3  . hydrochlorothiazide (HYDRODIURIL) 25 MG tablet TAKE ONE (1) TABLET BY MOUTH EACH DAY 90 tablet 0  . HYDROcodone-acetaminophen (NORCO) 5-325 MG tablet Take 1 tablet by mouth every 6 (six) hours as needed for moderate pain. 30 tablet 0  . metFORMIN (GLUCOPHAGE) 1000 MG tablet TAKE ONE TABLET BY MOUTH TWICE DAILY 180 tablet 1  . Multiple Vitamin (MULTIVITAMIN WITH MINERALS)  TABS tablet Take 1 tablet by mouth daily.    . traMADol (ULTRAM) 50 MG tablet Take 50 mg by mouth 3 (three) times daily as needed. For back pain     No current facility-administered medications on file prior to visit.    Review of Systems  Constitutional: Negative for other unusual diaphoresis or sweats HENT: Negative for ear discharge or swelling Eyes: Negative for other worsening visual disturbances Respiratory: Negative for stridor or other swelling  Gastrointestinal: Negative for worsening distension or other blood Genitourinary: Negative for retention or other urinary change Musculoskeletal: Negative for other MSK pain or swelling Skin: Negative for color change or other new lesions Neurological: Negative for worsening tremors and other numbness  Psychiatric/Behavioral: Negative for worsening agitation or other fatigue All other system neg per pt    Objective:   Physical Exam BP 114/72   Pulse 60   Temp 98.5 F (36.9 C) (Oral)   Ht 5\' 11"  (1.803 m)   Wt 257 lb (116.6 kg)   SpO2 98%   BMI 35.84 kg/m  VS noted,  Constitutional: Pt appears in NAD HENT: Head: NCAT.  Right Ear: External ear normal.  Left Ear: External ear normal.  Eyes: . Pupils are equal, round, and reactive to light. Conjunctivae and EOM are normal Nose: without d/c or deformity Neck: Neck supple. Gross normal ROM Cardiovascular: Normal rate and regular rhythm.   Pulmonary/Chest: Effort normal and breath sounds without rales or wheezing.  Abd:  Soft, NT, ND, + BS, no organomegaly Neurological: Pt is alert. At baseline orientation, motor grossly intact Skin: Skin is warm. No rashes, other new lesions, no LE edema Psychiatric: Pt behavior is normal without agitation  No other exam findings  Lab Results  Component Value Date   WBC 7.4 08/31/2017   HGB 13.8 08/31/2017   HCT 39.9 08/31/2017   PLT 155.0 08/31/2017   GLUCOSE 182 (H) 08/31/2017   CHOL 130 08/31/2017   TRIG 177.0 (H) 08/31/2017   HDL  39.30 08/31/2017   LDLDIRECT 92.0 02/06/2017   LDLCALC 56 08/31/2017   ALT 33 08/31/2017   AST 18 08/31/2017   NA 138 08/31/2017   K 3.9 08/31/2017   CL 99 08/31/2017   CREATININE 1.08 08/31/2017   BUN 19 08/31/2017   CO2 30 08/31/2017   TSH 2.89 08/31/2017   PSA 2.15 07/14/2016   INR 1.1 09/24/2008   HGBA1C 8.2 (H) 08/31/2017   MICROALBUR 1.6 08/31/2017       Assessment & Plan:

## 2018-03-09 NOTE — Patient Instructions (Signed)

## 2018-03-09 NOTE — Assessment & Plan Note (Signed)
stable overall by history and exam, recent data reviewed with pt, and pt to continue medical treatment as before,  to f/u any worsening symptoms or concerns  

## 2018-03-12 ENCOUNTER — Telehealth: Payer: Self-pay

## 2018-03-12 NOTE — Telephone Encounter (Signed)
Pt has viewed results via MyChart  

## 2018-03-12 NOTE — Telephone Encounter (Signed)
-----   Message from Biagio Borg, MD sent at 03/09/2018  4:38 PM EST ----- Left message on MyChart, pt to cont same tx except  The test results show that your current treatment is OK, except the A1c is still mildly high (though it is improved).  We should increase the Jardiance to 25 mg per day.   I will send a new prescription and you should hear from the office as well    [name] to please inform pt, I will do rx

## 2018-03-24 IMAGING — DX DG SPINE 1V PORT
1 series · 1 of 1 positions shown · non-contrast
Comparison: Lumbar spine exams same date. Lumbar spine series [DATE]
08/26/2016.

CLINICAL DATA: Lumbar spine surgery.

EXAM:
PORTABLE SPINE - 1 VIEW

[l-spine lat]
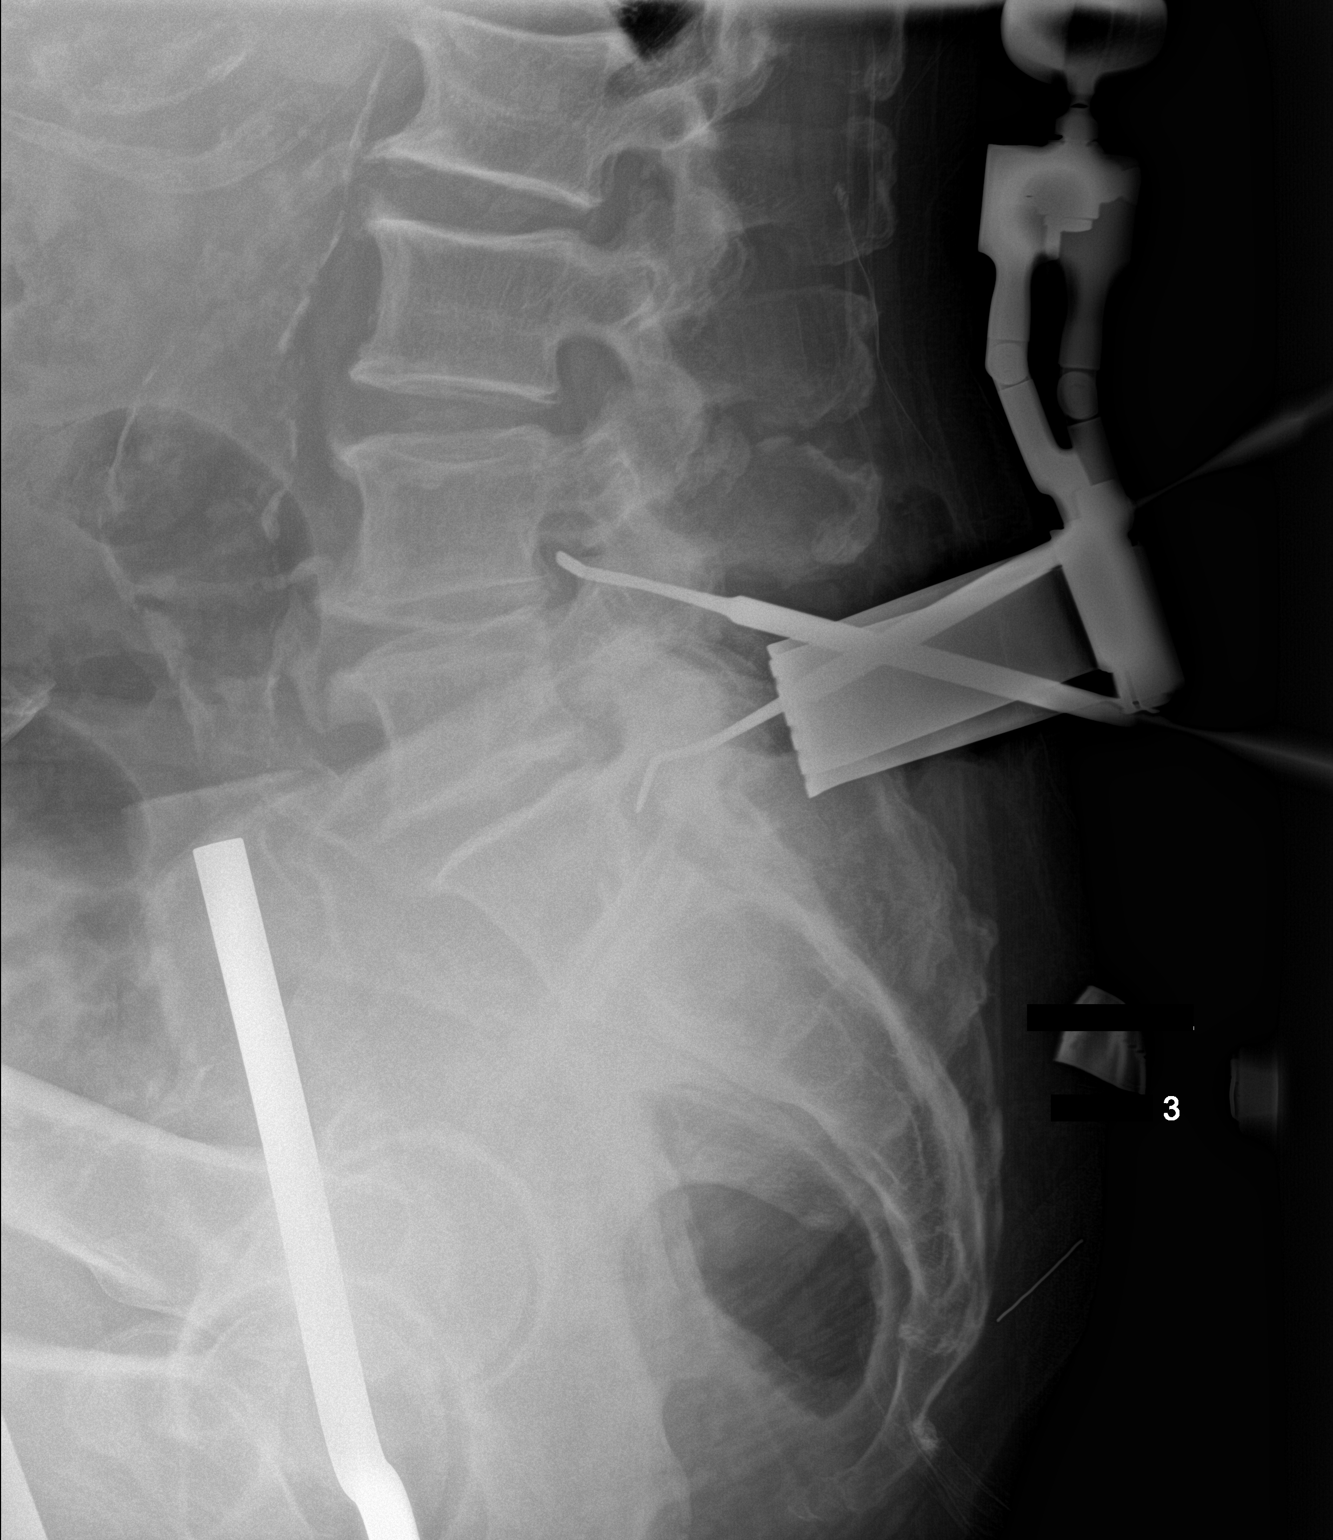

[1 of 1 positions shown; findings below may reference images not displayed]

FINDINGS: Lumbar spine numbered as per prior exams. Metallic markers are noted
posteriorly at the L3-L4 and L4-L5 levels. Abdominal aortic aneurysm
again noted.
IMPRESSION: Metallic markers noted posteriorly at the L3-L4 and L4-L5 levels .

## 2018-03-24 IMAGING — DX DG SPINE 1V PORT
1 series · 1 of 1 positions shown · non-contrast
Comparison: Prior study of the same date.  06/03/2016 .

CLINICAL DATA: Lumbar surgery.

EXAM:
PORTABLE SPINE - 1 VIEW

[l-spine lat]
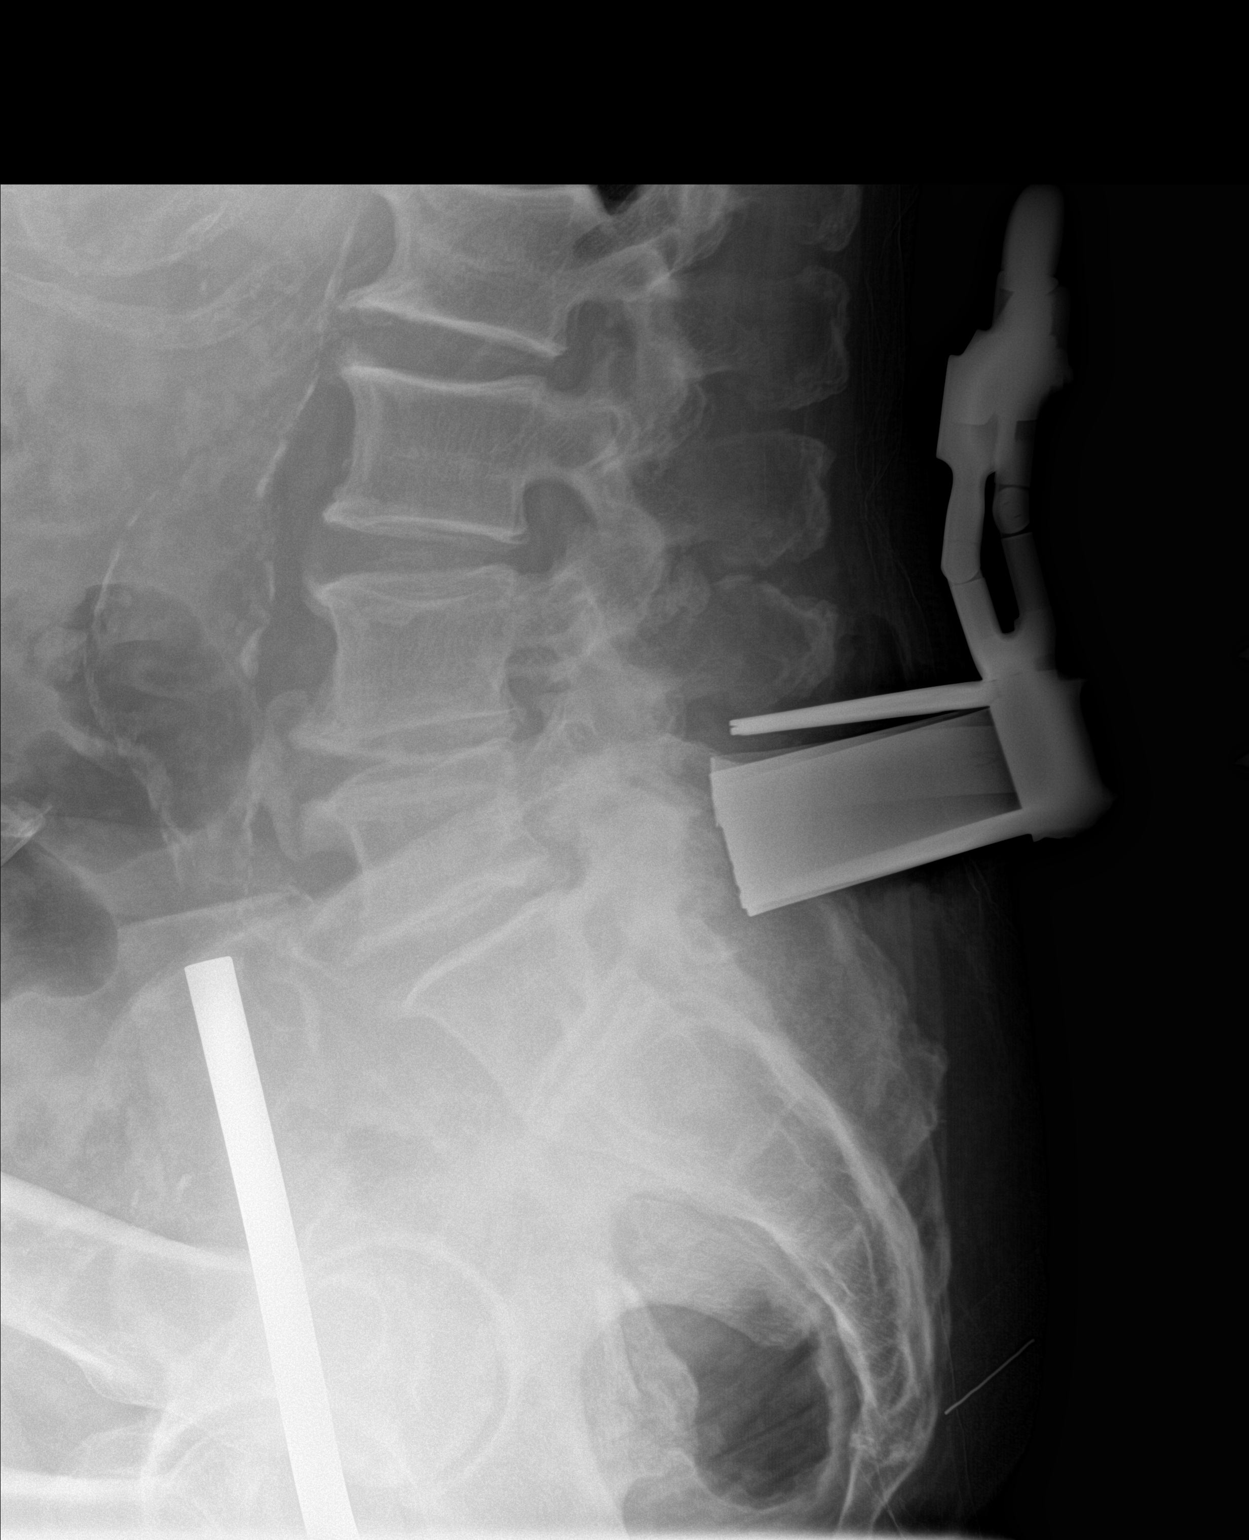

[1 of 1 positions shown; findings below may reference images not displayed]

FINDINGS: Lumbar vertebra numbered as per prior exam of the same day. Metallic
marker noted posteriorly at the L3-L4 level. Diffuse degenerative
change. Again noted is aneurysmal dilatation of the distal abdominal
aorta.
IMPRESSION: Lumbar vertebra numbered as per prior exam of same date. Metallic
marker noted posteriorly at the L3-L4 level.

## 2018-03-30 ENCOUNTER — Other Ambulatory Visit: Payer: Self-pay | Admitting: Internal Medicine

## 2018-03-30 MED ORDER — GLIPIZIDE ER 10 MG PO TB24: 10.0000 mg | ORAL_TABLET | Freq: Every day | ORAL | 3 refills | Status: DC

## 2018-03-30 NOTE — Telephone Encounter (Signed)
Requested Prescriptions  Pending Prescriptions Disp Refills  . glipiZIDE (GLUCOTROL XL) 10 MG 24 hr tablet 90 tablet 3    Sig: Take 1 tablet (10 mg total) by mouth daily with breakfast.     Endocrinology:  Diabetes - Sulfonylureas Passed - 03/30/2018  2:49 PM      Passed - HBA1C is between 0 and 7.9 and within 180 days    Hgb A1c MFr Bld  Date Value Ref Range Status  03/09/2018 7.8 (H) 4.6 - 6.5 % Final    Comment:    Glycemic Control Guidelines for People with Diabetes:Non Diabetic:  <6%Goal of Therapy: <7%Additional Action Suggested:  >8%          Passed - Valid encounter within last 6 months    Recent Outpatient Visits          3 weeks ago Diabetes mellitus type 2, noninsulin dependent ([place])   [facility] -Georges Mouse, MD   6 months ago Preventative health care   [facility] -Georges Mouse, MD   1 year ago Diabetes mellitus type 2, noninsulin dependent [facility])   [facility] -Georges Mouse, MD   1 year ago Preventative health care   [facility] -Georges Mouse, MD   1 year ago Hypertension, unspecified type   [place], James W, MD      Future Appointments            In 5 months Jenny Reichmann, Hunt Oris, MD [place], [facility]

## 2018-03-30 NOTE — Telephone Encounter (Signed)
Copied from [place] [id number]. Topic: Quick Communication - Rx Refill/Question >> Mar 30, 2018  2:45 PM Windy Kalata wrote: Medication: glipiZIDE (GLUCOTROL XL) 10 MG 24   Has the patient contacted their pharmacy? No. (Agent: If no, request that the patient contact the pharmacy for the refill.) (Agent: If yes, when and what did the pharmacy advise?)  Preferred Pharmacy (with phone number or street name): [place], [employer] - [address] [phone] (Phone) [phone] (Fax)    Agent: Please be advised that RX refills may take up to 3 business days. We ask that you follow-up with your pharmacy.

## 2018-05-07 ENCOUNTER — Other Ambulatory Visit: Payer: Self-pay | Admitting: Internal Medicine

## 2018-05-28 ENCOUNTER — Other Ambulatory Visit: Payer: Self-pay | Admitting: Internal Medicine

## 2018-08-07 ENCOUNTER — Other Ambulatory Visit: Payer: Self-pay | Admitting: Internal Medicine

## 2018-08-29 DIAGNOSIS — I872 Venous insufficiency (chronic) (peripheral): Secondary | ICD-10-CM | POA: Diagnosis not present

## 2018-09-03 ENCOUNTER — Other Ambulatory Visit: Payer: Self-pay | Admitting: Internal Medicine

## 2018-09-10 ENCOUNTER — Other Ambulatory Visit (INDEPENDENT_AMBULATORY_CARE_PROVIDER_SITE_OTHER): Payer: Medicare HMO

## 2018-09-10 DIAGNOSIS — I10 Essential (primary) hypertension: Secondary | ICD-10-CM

## 2018-09-10 DIAGNOSIS — E119 Type 2 diabetes mellitus without complications: Secondary | ICD-10-CM

## 2018-09-10 DIAGNOSIS — E782 Mixed hyperlipidemia: Secondary | ICD-10-CM

## 2018-09-10 LAB — CBC WITH DIFFERENTIAL/PLATELET
Basophils Absolute: 0 10*3/uL (ref 0.0–0.1)
Basophils Relative: 0.5 % (ref 0.0–3.0)
Eosinophils Absolute: 0.3 10*3/uL (ref 0.0–0.7)
Eosinophils Relative: 2.8 % (ref 0.0–5.0)
HCT: 45.9 % (ref 39.0–52.0)
Hemoglobin: 15.6 g/dL (ref 13.0–17.0)
Lymphocytes Relative: 24.7 % (ref 12.0–46.0)
Lymphs Abs: 2.3 10*3/uL (ref 0.7–4.0)
MCHC: 33.9 g/dL (ref 30.0–36.0)
MCV: 94 fl (ref 78.0–100.0)
Monocytes Absolute: 0.6 10*3/uL (ref 0.1–1.0)
Monocytes Relative: 6.2 % (ref 3.0–12.0)
Neutro Abs: 6.1 10*3/uL (ref 1.4–7.7)
Neutrophils Relative %: 65.8 % (ref 43.0–77.0)
Platelets: 163 10*3/uL (ref 150.0–400.0)
RBC: 4.88 Mil/uL (ref 4.22–5.81)
RDW: 14.9 % (ref 11.5–15.5)
WBC: 9.3 10*3/uL (ref 4.0–10.5)

## 2018-09-10 LAB — URINALYSIS, ROUTINE W REFLEX MICROSCOPIC
Bilirubin Urine: NEGATIVE
Hgb urine dipstick: NEGATIVE
Leukocytes,Ua: NEGATIVE
Nitrite: NEGATIVE
Specific Gravity, Urine: 1.02 (ref 1.000–1.030)
Total Protein, Urine: NEGATIVE
Urine Glucose: >=1000 — AB
Urobilinogen, UA: 0.2 (ref 0.0–1.0)
pH: 5.5 (ref 5.0–8.0)

## 2018-09-10 LAB — LIPID PANEL
Cholesterol: 142 mg/dL (ref 0–200)
HDL: 43.3 mg/dL (ref >39.00)
LDL Cholesterol: 65 mg/dL (ref 0–99)
NonHDL: 98.26
Total CHOL/HDL Ratio: 3
Triglycerides: 166 mg/dL — ABNORMAL HIGH (ref 0.0–149.0)
VLDL: 33.2 mg/dL (ref 0.0–40.0)

## 2018-09-10 LAB — BASIC METABOLIC PANEL
BUN: 23 mg/dL (ref 6–23)
CO2: 26 mEq/L (ref 19–32)
Calcium: 12.6 mg/dL — ABNORMAL HIGH (ref 8.4–10.5)
Chloride: 100 mEq/L (ref 96–112)
Creatinine, Ser: 1.16 mg/dL (ref 0.40–1.50)
GFR: 60.33 mL/min (ref >60.00)
Glucose, Bld: 165 mg/dL — ABNORMAL HIGH (ref 70–99)
Potassium: 3.8 mEq/L (ref 3.5–5.1)
Sodium: 138 mEq/L (ref 135–145)

## 2018-09-10 LAB — HEPATIC FUNCTION PANEL
ALT: 36 U/L (ref 0–53)
AST: 24 U/L (ref 0–37)
Albumin: 4.5 g/dL (ref 3.5–5.2)
Alkaline Phosphatase: 99 U/L (ref 39–117)
Bilirubin, Direct: 0.2 mg/dL (ref 0.0–0.3)
Total Bilirubin: 0.7 mg/dL (ref 0.2–1.2)
Total Protein: 7.6 g/dL (ref 6.0–8.3)

## 2018-09-10 LAB — MICROALBUMIN / CREATININE URINE RATIO
Creatinine,U: 113.9 mg/dL
Microalb Creat Ratio: 0.7 mg/g (ref 0.0–30.0)
Microalb, Ur: 0.8 mg/dL (ref 0.0–1.9)

## 2018-09-10 LAB — HEMOGLOBIN A1C: Hgb A1c MFr Bld: 7.4 % — ABNORMAL HIGH (ref 4.6–6.5)

## 2018-09-10 LAB — TSH: TSH: 3.57 u[IU]/mL (ref 0.35–4.50)

## 2018-09-12 ENCOUNTER — Ambulatory Visit (INDEPENDENT_AMBULATORY_CARE_PROVIDER_SITE_OTHER): Payer: Medicare HMO | Admitting: Internal Medicine

## 2018-09-12 ENCOUNTER — Encounter: Payer: Self-pay | Admitting: Internal Medicine

## 2018-09-12 ENCOUNTER — Other Ambulatory Visit: Payer: Self-pay

## 2018-09-12 VITALS — BP 126/82 | HR 59 | Temp 98.1°F | Ht 71.0 in | Wt 253.0 lb

## 2018-09-12 DIAGNOSIS — E611 Iron deficiency: Secondary | ICD-10-CM | POA: Diagnosis not present

## 2018-09-12 DIAGNOSIS — Z0000 Encounter for general adult medical examination without abnormal findings: Secondary | ICD-10-CM

## 2018-09-12 DIAGNOSIS — Z23 Encounter for immunization: Secondary | ICD-10-CM | POA: Diagnosis not present

## 2018-09-12 DIAGNOSIS — E538 Deficiency of other specified B group vitamins: Secondary | ICD-10-CM

## 2018-09-12 DIAGNOSIS — E559 Vitamin D deficiency, unspecified: Secondary | ICD-10-CM

## 2018-09-12 DIAGNOSIS — E119 Type 2 diabetes mellitus without complications: Secondary | ICD-10-CM | POA: Diagnosis not present

## 2018-09-12 NOTE — Patient Instructions (Addendum)
You had the Tdap tetanus shot today  Please continue all other medications as before, and refills have been done if requested.  Please have the pharmacy call with any other refills you may need.  Please continue your efforts at being more active, low cholesterol diet, and weight control.  You are otherwise up to date with prevention measures today.  Please keep your appointments with your specialists as you may have planned  Please return in 6 months, or sooner if needed, with Lab testing done 3-5 days before

## 2018-09-12 NOTE — Assessment & Plan Note (Signed)
stable overall by history and exam, recent data reviewed with pt, and pt to continue medical treatment as before,  to f/u any worsening symptoms or concerns  

## 2018-09-12 NOTE — Progress Notes (Signed)
Subjective:    Patient ID: [name], male    DOB: Dec 08, 1934, 84 y.o.   MRN: [id number]  HPI   Here for wellness and f/u;  Overall doing ok;  Pt denies Chest pain, worsening SOB, DOE, wheezing, orthopnea, PND, worsening LE edema, palpitations, dizziness or syncope.  Pt denies neurological change such as new headache, facial or extremity weakness.  Pt denies polydipsia, polyuria, or low sugar symptoms. Pt states overall good compliance with treatment and medications, good tolerability, and has been trying to follow appropriate diet.  Pt denies worsening depressive symptoms, suicidal ideation or panic. No fever, night sweats, wt loss, loss of appetite, or other constitutional symptoms.  Pt states good ability with ADL's, has low fall risk, home safety reviewed and adequate, no other significant changes in hearing or vision, and only occasionally active with exercise.  Saw derm for recent rash to distal legs right > left, using tx and to f/u next wk.  Due for Tdap.  No new complaints Past Medical History:  Diagnosis Date  . AAA (abdominal aortic aneurysm) ([place])    MONITORED BY DR LAWSON LOV JUNE 2014--  STABLE  AT 3.7  . Allergic rhinitis   . Arthritis   . Asymptomatic stenosis of left carotid artery without infarction    MILD DISEASE  . BPH (benign prostatic hypertrophy)   . Diverticulosis of colon   . History of cardiomyopathy    IDIOPATHIC-- PER [facility] NOTE 2013 , CURRENTLY RESOLVED  . History of kidney stones   . Hyperlipidemia   . Hypertension   . Peripheral vascular disease ([place])   . Phimosis   . Right carotid artery occlusion    CHRONIC   WITHOUT INFARTION  . Stroke [facility])     Right eye  . Type 2 diabetes mellitus ([place])   . Wears hearing aid    Past Surgical History:  Procedure Laterality Date  . CARDIOVASCULAR STRESS TEST  JUNE 2008  DR Wynonia Lawman  . CATARACT EXTRACTION W/ INTRAOCULAR LENS IMPLANT Right   . CIRCUMCISION N/A 08/30/2012   Procedure: CIRCUMCISION ADULT;   Surgeon: Malka So, MD;  Location: [facility];  Service: Urology;  Laterality: N/A;  . CYSTO/  RIGHT URETERAL STENT PLACEMENT  10-16-2003  . EXTRACORPOREAL SHOCK WAVE LITHOTRIPSY Right 2005  . EYE SURGERY    . JOINT REPLACEMENT Bilateral 2008  2009   Knee  . LUMBAR LAMINECTOMY/DECOMPRESSION MICRODISCECTOMY Bilateral 06/08/2016   Procedure: Bilateral Microlumbar decompression L4-L5 and L5-S1;  Surgeon: Susa Day, MD;  Location: [facility];  Service: Orthopedics;  Laterality: Bilateral;  Requests 2 hours  . TOTAL KNEE ARTHROPLASTY Bilateral RIGHT 10-08-2007;   LEFT 09-30-2008  . TRANSTHORACIC ECHOCARDIOGRAM  10-21-2009   LVEF 50%    reports that he quit smoking about 36 years ago. His smoking use included cigarettes. He has a 18.75 pack-year smoking history. He has never used smokeless tobacco. He reports that he does not drink alcohol or use drugs. family history includes ALS in his father; Asthma in his mother; COPD in his mother; Depression in his mother; Diabetes in his father; Heart disease in his mother; Hypertension in his mother; Kidney disease in his mother. No Known Allergies Current Outpatient Medications on File Prior to Visit  Medication Sig Dispense Refill  . atorvastatin (LIPITOR) 40 MG tablet TAKE ONE (1) TABLET BY MOUTH EACH DAY 90 tablet 1  . beta carotene w/minerals (OCUVITE) tablet Take 1 tablet by mouth daily.    . carvedilol (  COREG) 12.5 MG tablet TAKE 1/2 TABLET TWICE DAILY WITH A MEAL 90 tablet 1  . clobetasol ointment (TEMOVATE) 0.05 % APPLY TOPICALLY TWICE DAILY 30 g 1  . empagliflozin (JARDIANCE) 25 MG TABS tablet Take 25 mg by mouth daily. 90 tablet 3  . glipiZIDE (GLUCOTROL XL) 10 MG 24 hr tablet Take 1 tablet (10 mg total) by mouth daily with breakfast. 90 tablet 3  . hydrochlorothiazide (HYDRODIURIL) 25 MG tablet TAKE ONE (1) TABLET BY MOUTH EACH DAY 90 tablet 1  . HYDROcodone-acetaminophen (NORCO) 5-325 MG tablet Take 1 tablet by mouth every 6  (six) hours as needed for moderate pain. 30 tablet 0  . metFORMIN (GLUCOPHAGE) 1000 MG tablet TAKE ONE TABLET BY MOUTH TWICE DAILY 180 tablet 1  . Multiple Vitamin (MULTIVITAMIN WITH MINERALS) TABS tablet Take 1 tablet by mouth daily.    . traMADol (ULTRAM) 50 MG tablet Take 50 mg by mouth 3 (three) times daily as needed. For back pain     No current facility-administered medications on file prior to visit.    Review of Systems Constitutional: Negative for other unusual diaphoresis, sweats, appetite or weight changes HENT: Negative for other worsening hearing loss, ear pain, facial swelling, mouth sores or neck stiffness.   Eyes: Negative for other worsening pain, redness or other visual disturbance.  Respiratory: Negative for other stridor or swelling Cardiovascular: Negative for other palpitations or other chest pain  Gastrointestinal: Negative for worsening diarrhea or loose stools, blood in stool, distention or other pain Genitourinary: Negative for hematuria, flank pain or other change in urine volume.  Musculoskeletal: Negative for myalgias or other joint swelling.  Skin: Negative for other color change, or other wound or worsening drainage.  Neurological: Negative for other syncope or numbness. Hematological: Negative for other adenopathy or swelling Psychiatric/Behavioral: Negative for hallucinations, other worsening agitation, SI, self-injury, or new decreased concentration All other system neg per pt    Objective:   Physical Exam BP 126/82   Pulse (!) 59   Temp 98.1 F (36.7 C) (Oral)   Ht 5\' 11"  (1.803 m)   Wt 253 lb (114.8 kg)   SpO2 98%   BMI 35.29 kg/m  VS noted,  Constitutional: Pt is oriented to person, place, and time. Appears well-developed and well-nourished, in no significant distress and comfortable Head: Normocephalic and atraumatic  Eyes: Conjunctivae and EOM are normal. Pupils are equal, round, and reactive to light Right Ear: External ear normal without  discharge Left Ear: External ear normal without discharge Nose: Nose without discharge or deformity Mouth/Throat: Oropharynx is without other ulcerations and moist  Neck: Normal range of motion. Neck supple. No JVD present. No tracheal deviation present or significant neck LA or mass Cardiovascular: Normal rate, regular rhythm, normal heart sounds and intact distal pulses.   Pulmonary/Chest: WOB normal and breath sounds without rales or wheezing  Abdominal: Soft. Bowel sounds are normal. NT. No HSM  Musculoskeletal: Normal range of motion. Exhibits no edema Lymphadenopathy: Has no other cervical adenopathy.  Neurological: Pt is alert and oriented to person, place, and time. Pt has normal reflexes. No cranial nerve deficit. Motor grossly intact, Gait intact Skin: Skin is warm and dry. No rash noted or new ulcerations Psychiatric:  Has normal mood and affect. Behavior is normal without agitation No other exam findings Lab Results  Component Value Date   WBC 9.3 09/10/2018   HGB 15.6 09/10/2018   HCT 45.9 09/10/2018   PLT 163.0 09/10/2018   GLUCOSE 165 (H) 09/10/2018  CHOL 142 09/10/2018   TRIG 166.0 (H) 09/10/2018   HDL 43.30 09/10/2018   LDLDIRECT 92.0 02/06/2017   LDLCALC 65 09/10/2018   ALT 36 09/10/2018   AST 24 09/10/2018   NA 138 09/10/2018   K 3.8 09/10/2018   CL 100 09/10/2018   CREATININE 1.16 09/10/2018   BUN 23 09/10/2018   CO2 26 09/10/2018   TSH 3.57 09/10/2018   PSA 2.15 07/14/2016   INR 1.1 09/24/2008   HGBA1C 7.4 (H) 09/10/2018   MICROALBUR 0.8 09/10/2018          Assessment & Plan:

## 2018-09-12 NOTE — Assessment & Plan Note (Signed)

## 2018-09-28 ENCOUNTER — Other Ambulatory Visit: Payer: Self-pay | Admitting: Internal Medicine

## 2018-10-02 DIAGNOSIS — D485 Neoplasm of uncertain behavior of skin: Secondary | ICD-10-CM | POA: Diagnosis not present

## 2018-10-02 DIAGNOSIS — C44622 Squamous cell carcinoma of skin of right upper limb, including shoulder: Secondary | ICD-10-CM | POA: Diagnosis not present

## 2018-10-02 DIAGNOSIS — L570 Actinic keratosis: Secondary | ICD-10-CM | POA: Diagnosis not present

## 2018-10-02 DIAGNOSIS — B359 Dermatophytosis, unspecified: Secondary | ICD-10-CM | POA: Diagnosis not present

## 2018-10-25 DIAGNOSIS — D485 Neoplasm of uncertain behavior of skin: Secondary | ICD-10-CM | POA: Diagnosis not present

## 2018-10-25 DIAGNOSIS — C44622 Squamous cell carcinoma of skin of right upper limb, including shoulder: Secondary | ICD-10-CM | POA: Diagnosis not present

## 2018-10-25 DIAGNOSIS — B079 Viral wart, unspecified: Secondary | ICD-10-CM | POA: Diagnosis not present

## 2018-10-25 DIAGNOSIS — C44629 Squamous cell carcinoma of skin of left upper limb, including shoulder: Secondary | ICD-10-CM | POA: Diagnosis not present

## 2018-10-25 DIAGNOSIS — L905 Scar conditions and fibrosis of skin: Secondary | ICD-10-CM | POA: Diagnosis not present

## 2018-10-25 DIAGNOSIS — D0461 Carcinoma in situ of skin of right upper limb, including shoulder: Secondary | ICD-10-CM | POA: Diagnosis not present

## 2018-11-02 DIAGNOSIS — B351 Tinea unguium: Secondary | ICD-10-CM | POA: Diagnosis not present

## 2018-11-02 DIAGNOSIS — B353 Tinea pedis: Secondary | ICD-10-CM | POA: Diagnosis not present

## 2018-11-02 DIAGNOSIS — Z23 Encounter for immunization: Secondary | ICD-10-CM | POA: Diagnosis not present

## 2018-11-13 DIAGNOSIS — D0462 Carcinoma in situ of skin of left upper limb, including shoulder: Secondary | ICD-10-CM | POA: Diagnosis not present

## 2018-11-13 DIAGNOSIS — C44629 Squamous cell carcinoma of skin of left upper limb, including shoulder: Secondary | ICD-10-CM | POA: Diagnosis not present

## 2018-12-14 ENCOUNTER — Ambulatory Visit: Payer: Medicare HMO | Admitting: Cardiology

## 2018-12-14 ENCOUNTER — Other Ambulatory Visit: Payer: Self-pay

## 2018-12-14 ENCOUNTER — Encounter: Payer: Self-pay | Admitting: Cardiology

## 2018-12-14 VITALS — BP 112/64 | HR 62 | Ht 71.0 in | Wt 256.8 lb

## 2018-12-14 DIAGNOSIS — I6523 Occlusion and stenosis of bilateral carotid arteries: Secondary | ICD-10-CM | POA: Diagnosis not present

## 2018-12-14 DIAGNOSIS — I420 Dilated cardiomyopathy: Secondary | ICD-10-CM

## 2018-12-14 DIAGNOSIS — I2510 Atherosclerotic heart disease of native coronary artery without angina pectoris: Secondary | ICD-10-CM | POA: Diagnosis not present

## 2018-12-14 DIAGNOSIS — I739 Peripheral vascular disease, unspecified: Secondary | ICD-10-CM | POA: Diagnosis not present

## 2018-12-14 DIAGNOSIS — R69 Illness, unspecified: Secondary | ICD-10-CM | POA: Diagnosis not present

## 2018-12-14 HISTORY — DX: Dilated cardiomyopathy: I42.0

## 2018-12-14 MED FILL — FLUAD QUADRIVALENT 0.5 ML P: 0.5 | 1 days supply | Qty: 1 | Fill #0

## 2018-12-14 NOTE — Progress Notes (Signed)
Cardiology Office Note:    Date:  12/14/2018   ID:  [name], DOB Mar 19, 1934, MRN [id number]  PCP:  Biagio Borg, MD  Cardiologist:  Jenne Campus, MD    Referring MD: Biagio Borg, MD   Chief Complaint  Patient presents with  . Follow-up  Doing fine  History of Present Illness:    [name] is a 84 y.o. male with peripheral vascular disease, cardiac arterial disease as well as abdominal aortic aneurysm, history of cardiomyopathy with ejection fraction 45%, essential hypertension, dyslipidemia comes today to my office for follow-up he is lost his wife to leukemia about a year ago.  Obviously he is grieving after that but overall seems to be doing well he is very happy in the place he is staying he has a lot of support there.  Still walks his 2 dogs on the regular basis he still plays some golf.  Denies having a chest pain tightness squeezing pressure burning chest  Past Medical History:  Diagnosis Date  . AAA (abdominal aortic aneurysm) ([place])    MONITORED BY DR LAWSON LOV JUNE 2014--  STABLE  AT 3.7  . Allergic rhinitis   . Arthritis   . Asymptomatic stenosis of left carotid artery without infarction    MILD DISEASE  . BPH (benign prostatic hypertrophy)   . Diverticulosis of colon   . History of cardiomyopathy    IDIOPATHIC-- PER [facility] NOTE 2013 , CURRENTLY RESOLVED  . History of kidney stones   . Hyperlipidemia   . Hypertension   . Peripheral vascular disease ([place])   . Phimosis   . Right carotid artery occlusion    CHRONIC   WITHOUT INFARTION  . Stroke [facility])     Right eye  . Type 2 diabetes mellitus ([place])   . Wears hearing aid     Past Surgical History:  Procedure Laterality Date  . CARDIOVASCULAR STRESS TEST  JUNE 2008  DR Wynonia Lawman  . CATARACT EXTRACTION W/ INTRAOCULAR LENS IMPLANT Right   . CIRCUMCISION N/A 08/30/2012   Procedure: CIRCUMCISION ADULT;  Surgeon: Malka So, MD;  [facility];  Service: Urology;   Laterality: N/A;  . CYSTO/  RIGHT URETERAL STENT PLACEMENT  10-16-2003  . EXTRACORPOREAL SHOCK WAVE LITHOTRIPSY Right 2005  . EYE SURGERY    . JOINT REPLACEMENT Bilateral 2008  2009   Knee  . LUMBAR LAMINECTOMY/DECOMPRESSION MICRODISCECTOMY Bilateral 06/08/2016   Procedure: Bilateral Microlumbar decompression L4-L5 and L5-S1;  Surgeon: Susa Day, MD;  Location[facility];  Service: Orthopedics;  Laterality: Bilateral;  Requests 2 hours  . TOTAL KNEE ARTHROPLASTY Bilateral RIGHT 10-08-2007;   LEFT 09-30-2008  . TRANSTHORACIC ECHOCARDIOGRAM  10-21-2009   LVEF 50%    Current Medications: Current Meds  Medication Sig  . atorvastatin (LIPITOR) 40 MG tablet TAKE ONE (1) TABLET BY MOUTH EACH DAY  . beta carotene w/minerals (OCUVITE) tablet Take 1 tablet by mouth daily.  . carvedilol (COREG) 12.5 MG tablet TAKE 1/2 TABLET BY MOUTH TWICE A DAY WITH MEALS  . clobetasol ointment (TEMOVATE) 0.05 % APPLY TOPICALLY TWICE DAILY  . empagliflozin (JARDIANCE) 25 MG TABS tablet Take 25 mg by mouth daily.  Marland Kitchen glipiZIDE (GLUCOTROL XL) 10 MG 24 hr tablet Take 1 tablet (10 mg total) by mouth daily with breakfast.  . hydrochlorothiazide (HYDRODIURIL) 25 MG tablet TAKE ONE (1) TABLET BY MOUTH EACH DAY  . HYDROcodone-acetaminophen (NORCO) 5-325 MG tablet Take 1 tablet by mouth every 6 (six) hours  as needed for moderate pain.  . metFORMIN (GLUCOPHAGE) 1000 MG tablet TAKE ONE TABLET BY MOUTH TWICE DAILY  . Multiple Vitamin (MULTIVITAMIN WITH MINERALS) TABS tablet Take 1 tablet by mouth daily.  . traMADol (ULTRAM) 50 MG tablet Take 50 mg by mouth 3 (three) times daily as needed. For back pain     Allergies:   Patient has no known allergies.   Social History   Socioeconomic History  . Marital status: Widowed    Spouse name: Not on file  . Number of children: Not on file  . Years of education: Not on file  . Highest education level: Not on file  Occupational History  . 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 Financial resource strain: Not on file  . Food insecurity    Worry: Not on file    Inability: Not on file  . Transportation needs    Medical: Not on file    Non-medical: Not on file  Tobacco Use  . Smoking status: Former Smoker    Packs/day: 0.75    Years: 25.00    Pack years: 18.75    Types: Cigarettes    Quit date: 07/25/1982    Years since quitting: 36.4  . Smokeless tobacco: Never Used  Substance and Sexual Activity  . Alcohol use: No    Alcohol/week: 0.0 standard drinks    Comment: recovering alcoholic  [id number]  . Drug use: No  . Sexual activity: Not on file  Lifestyle  . Physical activity    Days per week: Not on file    Minutes per session: Not on file  . Stress: Not on file  Relationships  . Social Herbalist on phone: Not on file    Gets together: Not on file    Attends religious service: Not on file    Active member of club or organization: Not on file    Attends meetings of clubs or organizations: Not on file    Relationship status: Not on file  Other Topics Concern  . Not on file  Social History Narrative  . Not on file     Family History: The patient's family history includes ALS in his father; Asthma in his mother; COPD in his mother; Depression in his mother; Diabetes in his father; Heart disease in his mother; Hypertension in his mother; Kidney disease in his mother. There is no history of Colon cancer. ROS:   Please see the history of present illness.    All 14 point review of systems negative except as described per history of present illness  EKGs/Labs/Other Studies Reviewed:      Recent Labs: 09/10/2018: ALT 36; BUN 23; Creatinine, Ser 1.16; Hemoglobin 15.6; Platelets 163.0; Potassium 3.8; Sodium 138; TSH 3.57  Recent Lipid Panel    Component Value Date/Time   CHOL 142 09/10/2018 0839   TRIG 166.0 (H) 09/10/2018 0839   HDL 43.30 09/10/2018 0839    CHOLHDL 3 09/10/2018 0839   VLDL 33.2 09/10/2018 0839   LDLCALC 65 09/10/2018 0839   LDLDIRECT 92.0 02/06/2017 1119    Physical Exam:    VS:  BP 112/64   Pulse 62   Ht 5\' 11"  (1.803 m)   Wt 256 lb 12.8 oz (116.5 kg)   SpO2 95%   BMI 35.82 kg/m     Wt Readings from Last 3 Encounters:  12/14/18 256 lb  12.8 oz (116.5 kg)  09/12/18 253 lb (114.8 kg)  03/09/18 257 lb (116.6 kg)     GEN:  Well nourished, well developed in no acute distress HEENT: Normal NECK: No JVD; No carotid bruits LYMPHATICS: No lymphadenopathy CARDIAC: RRR, no murmurs, no rubs, no gallops RESPIRATORY:  Clear to auscultation without rales, wheezing or rhonchi  ABDOMEN: Soft, non-tender, non-distended MUSCULOSKELETAL:  No edema; No deformity  SKIN: Warm and dry LOWER EXTREMITIES: no swelling NEUROLOGIC:  Alert and oriented x 3 PSYCHIATRIC:  Normal affect   ASSESSMENT:    1. Peripheral vascular disease ([place])   2. Dilated cardiomyopathy ([place])   3. Coronary artery calcification seen on CT scan   4. Bilateral carotid artery stenosis    PLAN:    In order of problems listed above:  1. Peripheral vascular disease followed by a group in [place] 2. Dilated cardiomyopathy with ejection fraction 40%, on beta-blocker as well as Jardiance, not on ACE inhibitor not on ARB.  I will ask him to have his echocardiogram repeated his ejection fraction still 4045% and kidney function is normal will initiate ARB. 3. Coronary disease with calcification on the CT.  Asymptomatic. 4. Bilateral carotid artery stenosis that followed by group from [place].  We will modify his risk factors for coronary disease 5. Dyslipidemia, last LDL was 65 with HDL of 43 this is in August 2020.  We will continue present management.  EKG done today showed normal sinus rhythm frequent PVCs, left axis deviation, LVH   Medication Adjustments/Labs and Tests Ordered: Current medicines are reviewed at length with the patient today.  Concerns  regarding medicines are outlined above.  No orders of the defined types were placed in this encounter.  Medication changes: No orders of the defined types were placed in this encounter.   Signed, Park Liter, MD, [facility] 12/14/2018 1:56 PM    [place]

## 2018-12-14 NOTE — Addendum Note (Signed)
Addended by: Ashok Norris on: 12/14/2018 02:03 PM   Modules accepted: Orders

## 2018-12-14 NOTE — Patient Instructions (Signed)
Medication Instructions:  Your physician recommends that you continue on your current medications as directed. Please refer to the Current Medication list given to you today.  *If you need a refill on your cardiac medications before your next appointment, please call your pharmacy*  Lab Work: Your physician recommends that you return for lab work today: bmp   If you have labs (blood work) drawn today and your tests are completely normal, you will receive your results only by: Marland Kitchen MyChart Message (if you have MyChart) OR . A paper copy in the mail If you have any lab test that is abnormal or we need to change your treatment, we will call you to review the results.  Testing/Procedures: Your physician has requested that you have an echocardiogram. Echocardiography is a painless test that uses sound waves to create images of your heart. It provides your doctor with information about the size and shape of your heart and how well your heart's chambers and valves are working. This procedure takes approximately one hour. There are no restrictions for this procedure.    Follow-Up: At [facility], you and your health needs are our priority.  As part of our continuing mission to provide you with exceptional heart care, we have created designated Provider Care Teams.  These Care Teams include your primary Cardiologist (physician) and Advanced Practice Providers (APPs -  Physician Assistants and Nurse Practitioners) who all work together to provide you with the care you need, when you need it.  Your next appointment:   6 months  The format for your next appointment:   In Person  Provider:   You may see Dr. Agustin Cree or the following Advanced Practice Provider on your designated Care Team:    Laurann Montana, FNP   Other Instructions   Echocardiogram An echocardiogram is a procedure that uses painless sound waves (ultrasound) to produce an image of the heart. Images from an echocardiogram can  provide important information about:  Signs of coronary artery disease (CAD).  Aneurysm detection. An aneurysm is a weak or damaged part of an artery wall that bulges out from the normal force of blood pumping through the body.  Heart size and shape. Changes in the size or shape of the heart can be associated with certain conditions, including heart failure, aneurysm, and CAD.  Heart muscle function.  Heart valve function.  Signs of a past heart attack.  Fluid buildup around the heart.  Thickening of the heart muscle.  A tumor or infectious growth around the heart valves. Tell a health care provider about:  Any allergies you have.  All medicines you are taking, including vitamins, herbs, eye drops, creams, and over-the-counter medicines.  Any blood disorders you have.  Any surgeries you have had.  Any medical conditions you have.  Whether you are pregnant or may be pregnant. What are the risks? Generally, this is a safe procedure. However, problems may occur, including:  Allergic reaction to dye (contrast) that may be used during the procedure. What happens before the procedure? No specific preparation is needed. You may eat and drink normally. What happens during the procedure?   An IV tube may be inserted into one of your veins.  You may receive contrast through this tube. A contrast is an injection that improves the quality of the pictures from your heart.  A gel will be applied to your chest.  A wand-like tool (transducer) will be moved over your chest. The gel will help to transmit the sound  waves from the transducer.  The sound waves will harmlessly bounce off of your heart to allow the heart images to be captured in real-time motion. The images will be recorded on a computer. The procedure may vary among health care providers and hospitals. What happens after the procedure?  You may return to your normal, everyday life, including diet, activities, and  medicines, unless your health care provider tells you not to do that. Summary  An echocardiogram is a procedure that uses painless sound waves (ultrasound) to produce an image of the heart.  Images from an echocardiogram can provide important information about the size and shape of your heart, heart muscle function, heart valve function, and fluid buildup around your heart.  You do not need to do anything to prepare before this procedure. You may eat and drink normally.  After the echocardiogram is completed, you may return to your normal, everyday life, unless your health care provider tells you not to do that. This information is not intended to replace advice given to you by your health care provider. Make sure you discuss any questions you have with your health care provider. Document Released: 01/22/2000 Document Revised: 05/17/2018 Document Reviewed: 02/27/2016 Elsevier Patient Education  2020 [employer].

## 2018-12-15 LAB — BASIC METABOLIC PANEL
BUN/Creatinine Ratio: 16 (ref 10–24)
BUN: 21 mg/dL (ref 8–27)
CO2: 22 mmol/L (ref 20–29)
Calcium: 9.7 mg/dL (ref 8.6–10.2)
Chloride: 99 mmol/L (ref 96–106)
Creatinine, Ser: 1.28 mg/dL — ABNORMAL HIGH (ref 0.76–1.27)
GFR calc Af Amer: 60 mL/min/{1.73_m2} (ref >59)
GFR calc non Af Amer: 52 mL/min/{1.73_m2} — ABNORMAL LOW (ref >59)
Glucose: 169 mg/dL — ABNORMAL HIGH (ref 65–99)
Potassium: 4.1 mmol/L (ref 3.5–5.2)
Sodium: 140 mmol/L (ref 134–144)

## 2018-12-19 ENCOUNTER — Other Ambulatory Visit: Payer: Self-pay

## 2018-12-19 ENCOUNTER — Ambulatory Visit (HOSPITAL_BASED_OUTPATIENT_CLINIC_OR_DEPARTMENT_OTHER)
Admission: RE | Admit: 2018-12-19 | Discharge: 2018-12-19 | Disposition: A | Discharge status: Home / Self Care | Payer: Medicare HMO | Source: Ambulatory Visit | Attending: Cardiology | Admitting: Cardiology

## 2018-12-19 DIAGNOSIS — I420 Dilated cardiomyopathy: Secondary | ICD-10-CM | POA: Insufficient documentation

## 2018-12-19 DIAGNOSIS — I2510 Atherosclerotic heart disease of native coronary artery without angina pectoris: Secondary | ICD-10-CM | POA: Insufficient documentation

## 2018-12-19 NOTE — Progress Notes (Signed)
  Echocardiogram 2D Echocardiogram has been performed.  [name] 12/19/2018, 10:44 AM

## 2018-12-26 ENCOUNTER — Telehealth: Payer: Self-pay | Admitting: Cardiology

## 2018-12-26 DIAGNOSIS — I119 Hypertensive heart disease without heart failure: Secondary | ICD-10-CM

## 2018-12-26 NOTE — Telephone Encounter (Signed)
Pt. Says someone called him so he is returning the call

## 2018-12-27 MED ORDER — LOSARTAN POTASSIUM 25 MG PO TABS: 25.0000 mg | ORAL_TABLET | Freq: Every day | ORAL | 1 refills | Status: DC

## 2018-12-27 NOTE — Telephone Encounter (Signed)
Patient called back and informed of echo and lab results. Patient advised to start losartan 25 mg daily per Dr. Agustin Cree and have labs redrawn in 1 week. He verbally understood, no further questions.

## 2019-01-01 DIAGNOSIS — D0461 Carcinoma in situ of skin of right upper limb, including shoulder: Secondary | ICD-10-CM | POA: Diagnosis not present

## 2019-01-01 DIAGNOSIS — L986 Other infiltrative disorders of the skin and subcutaneous tissue: Secondary | ICD-10-CM | POA: Diagnosis not present

## 2019-01-01 DIAGNOSIS — L570 Actinic keratosis: Secondary | ICD-10-CM | POA: Diagnosis not present

## 2019-01-08 DIAGNOSIS — I119 Hypertensive heart disease without heart failure: Secondary | ICD-10-CM | POA: Diagnosis not present

## 2019-01-09 ENCOUNTER — Telehealth: Payer: Self-pay | Admitting: Emergency Medicine

## 2019-01-09 DIAGNOSIS — I10 Essential (primary) hypertension: Secondary | ICD-10-CM

## 2019-01-09 LAB — BASIC METABOLIC PANEL
BUN/Creatinine Ratio: 23 (ref 10–24)
BUN: 28 mg/dL — ABNORMAL HIGH (ref 8–27)
CO2: 25 mmol/L (ref 20–29)
Calcium: 9.9 mg/dL (ref 8.6–10.2)
Chloride: 98 mmol/L (ref 96–106)
Creatinine, Ser: 1.22 mg/dL (ref 0.76–1.27)
GFR calc Af Amer: 64 mL/min/{1.73_m2} (ref >59)
GFR calc non Af Amer: 55 mL/min/{1.73_m2} — ABNORMAL LOW (ref >59)
Glucose: 194 mg/dL — ABNORMAL HIGH (ref 65–99)
Potassium: 5 mmol/L (ref 3.5–5.2)
Sodium: 138 mmol/L (ref 134–144)

## 2019-01-09 MED ORDER — LOSARTAN POTASSIUM 25 MG PO TABS: 25.0000 mg | ORAL_TABLET | Freq: Two times a day (BID) | ORAL | 1 refills | Status: DC

## 2019-01-09 NOTE — Telephone Encounter (Signed)
Called patient informed him of lab results. Advised her per Dr. Agustin Cree to increase losartan to 25 mg twice daily and have labs drawn in 2 weeks. Patient verbally understood. No further questions.

## 2019-01-25 DIAGNOSIS — I10 Essential (primary) hypertension: Secondary | ICD-10-CM | POA: Diagnosis not present

## 2019-01-26 LAB — BASIC METABOLIC PANEL
BUN/Creatinine Ratio: 18 (ref 10–24)
BUN: 21 mg/dL (ref 8–27)
CO2: 24 mmol/L (ref 20–29)
Calcium: 9.7 mg/dL (ref 8.6–10.2)
Chloride: 99 mmol/L (ref 96–106)
Creatinine, Ser: 1.18 mg/dL (ref 0.76–1.27)
GFR calc Af Amer: 66 mL/min/{1.73_m2} (ref >59)
GFR calc non Af Amer: 57 mL/min/{1.73_m2} — ABNORMAL LOW (ref >59)
Glucose: 131 mg/dL — ABNORMAL HIGH (ref 65–99)
Potassium: 4.2 mmol/L (ref 3.5–5.2)
Sodium: 141 mmol/L (ref 134–144)

## 2019-01-30 ENCOUNTER — Other Ambulatory Visit: Payer: Self-pay

## 2019-01-30 DIAGNOSIS — I10 Essential (primary) hypertension: Secondary | ICD-10-CM

## 2019-01-31 ENCOUNTER — Other Ambulatory Visit: Payer: Self-pay | Admitting: Cardiology

## 2019-02-18 ENCOUNTER — Other Ambulatory Visit: Payer: Self-pay | Admitting: Internal Medicine

## 2019-02-18 ENCOUNTER — Other Ambulatory Visit: Payer: Self-pay

## 2019-02-18 MED ORDER — METFORMIN HCL 1000 MG PO TABS: 1000.0000 mg | ORAL_TABLET | Freq: Two times a day (BID) | ORAL | 1 refills | Status: DC

## 2019-03-15 ENCOUNTER — Other Ambulatory Visit: Payer: Self-pay

## 2019-03-15 ENCOUNTER — Encounter: Payer: Self-pay | Admitting: Internal Medicine

## 2019-03-15 ENCOUNTER — Ambulatory Visit (INDEPENDENT_AMBULATORY_CARE_PROVIDER_SITE_OTHER): Payer: Medicare HMO | Admitting: Internal Medicine

## 2019-03-15 VITALS — BP 122/78 | HR 44 | Temp 97.8°F | Ht 71.0 in | Wt 262.5 lb

## 2019-03-15 DIAGNOSIS — E538 Deficiency of other specified B group vitamins: Secondary | ICD-10-CM

## 2019-03-15 DIAGNOSIS — E611 Iron deficiency: Secondary | ICD-10-CM | POA: Diagnosis not present

## 2019-03-15 DIAGNOSIS — E119 Type 2 diabetes mellitus without complications: Secondary | ICD-10-CM | POA: Diagnosis not present

## 2019-03-15 DIAGNOSIS — Z0000 Encounter for general adult medical examination without abnormal findings: Secondary | ICD-10-CM | POA: Diagnosis not present

## 2019-03-15 DIAGNOSIS — E559 Vitamin D deficiency, unspecified: Secondary | ICD-10-CM

## 2019-03-15 HISTORY — DX: Hypercalcemia: E83.52

## 2019-03-15 LAB — CBC WITH DIFFERENTIAL/PLATELET
Basophils Absolute: 0 10*3/uL (ref 0.0–0.1)
Basophils Relative: 0.5 % (ref 0.0–3.0)
Eosinophils Absolute: 0.2 10*3/uL (ref 0.0–0.7)
Eosinophils Relative: 2.9 % (ref 0.0–5.0)
HCT: 45.9 % (ref 39.0–52.0)
Hemoglobin: 15.5 g/dL (ref 13.0–17.0)
Lymphocytes Relative: 30.8 % (ref 12.0–46.0)
Lymphs Abs: 2.7 10*3/uL (ref 0.7–4.0)
MCHC: 33.7 g/dL (ref 30.0–36.0)
MCV: 94.3 fl (ref 78.0–100.0)
Monocytes Absolute: 0.6 10*3/uL (ref 0.1–1.0)
Monocytes Relative: 7.1 % (ref 3.0–12.0)
Neutro Abs: 5.1 10*3/uL (ref 1.4–7.7)
Neutrophils Relative %: 58.7 % (ref 43.0–77.0)
Platelets: 142 10*3/uL — ABNORMAL LOW (ref 150.0–400.0)
RBC: 4.87 Mil/uL (ref 4.22–5.81)
RDW: 14.4 % (ref 11.5–15.5)
WBC: 8.7 10*3/uL (ref 4.0–10.5)

## 2019-03-15 LAB — URINALYSIS, ROUTINE W REFLEX MICROSCOPIC
Bilirubin Urine: NEGATIVE
Hgb urine dipstick: NEGATIVE
Ketones, ur: NEGATIVE
Leukocytes,Ua: NEGATIVE
Nitrite: NEGATIVE
Specific Gravity, Urine: 1.025 (ref 1.000–1.030)
Total Protein, Urine: NEGATIVE
Urine Glucose: >=1000 — AB
Urobilinogen, UA: 0.2 (ref 0.0–1.0)
WBC, UA: NONE SEEN — AB (ref 0–?)
pH: 5.5 (ref 5.0–8.0)

## 2019-03-15 LAB — BASIC METABOLIC PANEL
BUN: 26 mg/dL — ABNORMAL HIGH (ref 6–23)
CO2: 31 mEq/L (ref 19–32)
Calcium: 9.7 mg/dL (ref 8.4–10.5)
Chloride: 99 mEq/L (ref 96–112)
Creatinine, Ser: 1.13 mg/dL (ref 0.40–1.50)
GFR: 62.1 mL/min (ref >60.00)
Glucose, Bld: 126 mg/dL — ABNORMAL HIGH (ref 70–99)
Potassium: 4.1 mEq/L (ref 3.5–5.1)
Sodium: 139 mEq/L (ref 135–145)

## 2019-03-15 LAB — IBC PANEL
Iron: 74 ug/dL (ref 42–165)
Saturation Ratios: 18.2 % — ABNORMAL LOW (ref 20.0–50.0)
Transferrin: 291 mg/dL (ref 212.0–360.0)

## 2019-03-15 LAB — MICROALBUMIN / CREATININE URINE RATIO
Creatinine,U: 91.7 mg/dL
Microalb Creat Ratio: 0.8 mg/g (ref 0.0–30.0)
Microalb, Ur: <0.7 mg/dL (ref 0.0–1.9)

## 2019-03-15 LAB — HEPATIC FUNCTION PANEL
ALT: 27 U/L (ref 0–53)
AST: 19 U/L (ref 0–37)
Albumin: 4.4 g/dL (ref 3.5–5.2)
Alkaline Phosphatase: 66 U/L (ref 39–117)
Bilirubin, Direct: 0.2 mg/dL (ref 0.0–0.3)
Total Bilirubin: 0.8 mg/dL (ref 0.2–1.2)
Total Protein: 7.8 g/dL (ref 6.0–8.3)

## 2019-03-15 LAB — LIPID PANEL
Cholesterol: 134 mg/dL (ref 0–200)
HDL: 45.1 mg/dL (ref >39.00)
LDL Cholesterol: 72 mg/dL (ref 0–99)
NonHDL: 89.28
Total CHOL/HDL Ratio: 3
Triglycerides: 87 mg/dL (ref 0.0–149.0)
VLDL: 17.4 mg/dL (ref 0.0–40.0)

## 2019-03-15 LAB — VITAMIN D 25 HYDROXY (VIT D DEFICIENCY, FRACTURES): VITD: 40.3 ng/mL (ref 30.00–100.00)

## 2019-03-15 LAB — TSH: TSH: 2.7 u[IU]/mL (ref 0.35–4.50)

## 2019-03-15 LAB — VITAMIN B12: Vitamin B-12: 355 pg/mL (ref 211–911)

## 2019-03-15 LAB — HEMOGLOBIN A1C: Hgb A1c MFr Bld: 7.7 % — ABNORMAL HIGH (ref 4.6–6.5)

## 2019-03-15 NOTE — Patient Instructions (Signed)

## 2019-03-15 NOTE — Progress Notes (Signed)
Subjective:    Patient ID: [name], male    DOB: 05-18-1934, 85 y.o.   MRN: [id number]  HPI  Here for wellness and f/u;  Overall doing ok;  Pt denies Chest pain, worsening SOB, DOE, wheezing, orthopnea, PND, worsening LE edema, palpitations, dizziness or syncope.  Pt denies neurological change such as new headache, facial or extremity weakness.  Pt denies polydipsia, polyuria, or low sugar symptoms. Pt states overall good compliance with treatment and medications, good tolerability, and has been trying to follow appropriate diet.  Pt denies worsening depressive symptoms, suicidal ideation or panic. No fever, night sweats, wt loss, loss of appetite, or other constitutional symptoms.  Pt states good ability with ADL's, has low fall risk, home safety reviewed and adequate, no other significant changes in hearing or vision, and only occasionally active with exercise.  Also has bilateral sharp shoulder pains but FROM, plans to see sport medicine Past Medical History:  Diagnosis Date  . AAA (abdominal aortic aneurysm) ([place])    MONITORED BY DR LAWSON LOV JUNE 2014--  STABLE  AT 3.7  . Allergic rhinitis   . Arthritis   . Asymptomatic stenosis of left carotid artery without infarction    MILD DISEASE  . BPH (benign prostatic hypertrophy)   . Diverticulosis of colon   . History of cardiomyopathy    IDIOPATHIC-- PER [facility] NOTE 2013 , CURRENTLY RESOLVED  . History of kidney stones   . Hyperlipidemia   . Hypertension   . Peripheral vascular disease ([place])   . Phimosis   . Right carotid artery occlusion    CHRONIC   WITHOUT INFARTION  . Stroke [facility])     Right eye  . Type 2 diabetes mellitus ([place])   . Wears hearing aid    Past Surgical History:  Procedure Laterality Date  . CARDIOVASCULAR STRESS TEST  JUNE 2008  DR Wynonia Lawman  . CATARACT EXTRACTION W/ INTRAOCULAR LENS IMPLANT Right   . CIRCUMCISION N/A 08/30/2012   Procedure: CIRCUMCISION ADULT;  Surgeon: Malka So, MD;  Location: [facility];  Service: Urology;  Laterality: N/A;  . CYSTO/  RIGHT URETERAL STENT PLACEMENT  10-16-2003  . EXTRACORPOREAL SHOCK WAVE LITHOTRIPSY Right 2005  . EYE SURGERY    . JOINT REPLACEMENT Bilateral 2008  2009   Knee  . LUMBAR LAMINECTOMY/DECOMPRESSION MICRODISCECTOMY Bilateral 06/08/2016   Procedure: Bilateral Microlumbar decompression L4-L5 and L5-S1;  Surgeon: Susa Day, MD;  Location: [facility];  Service: Orthopedics;  Laterality: Bilateral;  Requests 2 hours  . TOTAL KNEE ARTHROPLASTY Bilateral RIGHT 10-08-2007;   LEFT 09-30-2008  . TRANSTHORACIC ECHOCARDIOGRAM  10-21-2009   LVEF 50%    reports that he quit smoking about 36 years ago. His smoking use included cigarettes. He has a 18.75 pack-year smoking history. He has never used smokeless tobacco. He reports that he does not drink alcohol or use drugs. family history includes ALS in his father; Asthma in his mother; COPD in his mother; Depression in his mother; Diabetes in his father; Heart disease in his mother; Hypertension in his mother; Kidney disease in his mother. No Known Allergies Current Outpatient Medications on File Prior to Visit  Medication Sig Dispense Refill  . atorvastatin (LIPITOR) 40 MG tablet TAKE ONE (1) TABLET BY MOUTH EACH DAY 90 tablet 1  . beta carotene w/minerals (OCUVITE) tablet Take 1 tablet by mouth daily.    . carvedilol (COREG) 12.5 MG tablet TAKE 1/2 TABLET BY MOUTH TWICE A DAY WITH MEALS  90 tablet 1  . clobetasol ointment (TEMOVATE) 0.05 % APPLY TOPICALLY TWICE DAILY 30 g 1  . empagliflozin (JARDIANCE) 25 MG TABS tablet Take 25 mg by mouth daily. 90 tablet 3  . glipiZIDE (GLUCOTROL XL) 10 MG 24 hr tablet Take 1 tablet (10 mg total) by mouth daily with breakfast. 90 tablet 3  . hydrochlorothiazide (HYDRODIURIL) 25 MG tablet TAKE ONE (1) TABLET BY MOUTH EACH DAY 90 tablet 1  . HYDROcodone-acetaminophen (NORCO) 5-325 MG tablet Take 1 tablet by mouth every 6 (six) hours as needed for moderate  pain. 30 tablet 0  . losartan (COZAAR) 25 MG tablet Take 1 tablet (25 mg total) by mouth 2 (two) times daily. 180 tablet 1  . metFORMIN (GLUCOPHAGE) 1000 MG tablet Take 1 tablet (1,000 mg total) by mouth 2 (two) times daily. 180 tablet 1  . Multiple Vitamin (MULTIVITAMIN WITH MINERALS) TABS tablet Take 1 tablet by mouth daily.    . traMADol (ULTRAM) 50 MG tablet Take 50 mg by mouth 3 (three) times daily as needed. For back pain    . ketoconazole (NIZORAL) 2 % cream Apply topically 2 (two) times daily.     No current facility-administered medications on file prior to visit.   Review of Systems All otherwise neg per pt     Objective:   Physical Exam BP 122/78 (BP Location: Left Arm, Patient Position: Sitting, Cuff Size: Large)   Pulse (!) 44   Temp 97.8 F (36.6 C) (Oral)   Ht 5\' 11"  (1.803 m)   Wt 262 lb 8 oz (119.1 kg)   SpO2 97%   BMI 36.61 kg/m  VS noted,  Constitutional: Pt appears in NAD HENT: Head: NCAT.  Right Ear: External ear normal.  Left Ear: External ear normal.  Eyes: . Pupils are equal, round, and reactive to light. Conjunctivae and EOM are normal Nose: without d/c or deformity Neck: Neck supple. Gross normal ROM Cardiovascular: Normal rate and regular rhythm.   Pulmonary/Chest: Effort normal and breath sounds without rales or wheezing.  Abd:  Soft, NT, ND, + BS, no organomegaly Neurological: Pt is alert. At baseline orientation, motor grossly intact Skin: Skin is warm. No rashes, other new lesions, no LE edema Psychiatric: Pt behavior is normal without agitation  All otherwise neg per pt Lab Results  Component Value Date   WBC 8.7 03/15/2019   HGB 15.5 03/15/2019   HCT 45.9 03/15/2019   PLT 142.0 (L) 03/15/2019   GLUCOSE 126 (H) 03/15/2019   CHOL 134 03/15/2019   TRIG 87.0 03/15/2019   HDL 45.10 03/15/2019   LDLDIRECT 92.0 02/06/2017   LDLCALC 72 03/15/2019   ALT 27 03/15/2019   AST 19 03/15/2019   NA 139 03/15/2019   K 4.1 03/15/2019   CL 99  03/15/2019   CREATININE 1.13 03/15/2019   BUN 26 (H) 03/15/2019   CO2 31 03/15/2019   TSH 2.70 03/15/2019   PSA 2.15 07/14/2016   INR 1.1 09/24/2008   HGBA1C 7.7 (H) 03/15/2019   MICROALBUR <0.7 03/15/2019      Assessment & Plan:

## 2019-03-17 ENCOUNTER — Encounter: Payer: Self-pay | Admitting: Internal Medicine

## 2019-03-17 NOTE — Assessment & Plan Note (Signed)

## 2019-03-17 NOTE — Assessment & Plan Note (Signed)
For PTH

## 2019-03-17 NOTE — Assessment & Plan Note (Signed)
stable overall by history and exam, recent data reviewed with pt, and pt to continue medical treatment as before,  to f/u any worsening symptoms or concerns  

## 2019-03-18 LAB — PTH, INTACT AND CALCIUM
Calcium: 9.3 mg/dL (ref 8.6–10.3)
PTH: 25 pg/mL (ref 14–64)

## 2019-03-19 ENCOUNTER — Other Ambulatory Visit: Payer: Self-pay | Admitting: Internal Medicine

## 2019-03-27 DIAGNOSIS — E119 Type 2 diabetes mellitus without complications: Secondary | ICD-10-CM | POA: Diagnosis not present

## 2019-03-27 DIAGNOSIS — H34211 Partial retinal artery occlusion, right eye: Secondary | ICD-10-CM | POA: Diagnosis not present

## 2019-03-27 DIAGNOSIS — H353113 Nonexudative age-related macular degeneration, right eye, advanced atrophic without subfoveal involvement: Secondary | ICD-10-CM | POA: Diagnosis not present

## 2019-03-27 DIAGNOSIS — H353122 Nonexudative age-related macular degeneration, left eye, intermediate dry stage: Secondary | ICD-10-CM | POA: Diagnosis not present

## 2019-03-27 DIAGNOSIS — H35372 Puckering of macula, left eye: Secondary | ICD-10-CM | POA: Diagnosis not present

## 2019-03-27 LAB — HM DIABETES EYE EXAM

## 2019-04-02 ENCOUNTER — Other Ambulatory Visit: Payer: Self-pay | Admitting: Internal Medicine

## 2019-04-10 ENCOUNTER — Encounter: Payer: Self-pay | Admitting: Family Medicine

## 2019-04-10 ENCOUNTER — Ambulatory Visit: Payer: Medicare HMO | Admitting: Family Medicine

## 2019-04-10 ENCOUNTER — Other Ambulatory Visit: Payer: Self-pay

## 2019-04-10 ENCOUNTER — Ambulatory Visit (INDEPENDENT_AMBULATORY_CARE_PROVIDER_SITE_OTHER): Payer: Medicare HMO

## 2019-04-10 VITALS — BP 128/72 | Ht 71.0 in | Wt 264.0 lb

## 2019-04-10 DIAGNOSIS — M12812 Other specific arthropathies, not elsewhere classified, left shoulder: Secondary | ICD-10-CM | POA: Diagnosis not present

## 2019-04-10 DIAGNOSIS — G8929 Other chronic pain: Secondary | ICD-10-CM | POA: Diagnosis not present

## 2019-04-10 DIAGNOSIS — M25512 Pain in left shoulder: Secondary | ICD-10-CM

## 2019-04-10 DIAGNOSIS — M25511 Pain in right shoulder: Secondary | ICD-10-CM

## 2019-04-10 DIAGNOSIS — M19012 Primary osteoarthritis, left shoulder: Secondary | ICD-10-CM | POA: Diagnosis not present

## 2019-04-10 DIAGNOSIS — M12811 Other specific arthropathies, not elsewhere classified, right shoulder: Secondary | ICD-10-CM

## 2019-04-10 DIAGNOSIS — M19011 Primary osteoarthritis, right shoulder: Secondary | ICD-10-CM | POA: Diagnosis not present

## 2019-04-10 HISTORY — DX: Other specific arthropathies, not elsewhere classified, right shoulder: M12.811

## 2019-04-10 NOTE — Patient Instructions (Addendum)
Xray on the way out Exercises 3x a week Voltaren gel 2x a day Vitamin D 2000mg  daily Tart cherry 1200mg  at night See me in 6 weeks if not better will do injections

## 2019-04-10 NOTE — Assessment & Plan Note (Signed)
Patient has signs and symptoms that is consistent with more of a bilateral rotator cuff arthropathy.  X-rays further ordered to evaluate the amount of arthritis.  Discussed icing regimen and home exercises.  Discussed topical anti-inflammatories that are over-the-counter natural supplementations.  Patient work with Product/process development scientist to learn home exercises in greater detail.  Patient will follow up with me again in 4 to 8 weeks worsening symptoms consider injection and then referral to formal physical therapy

## 2019-04-10 NOTE — Progress Notes (Signed)
[place] Phone: [phone] Subjective:   Joshua [name], am serving as a scribe for Joshua. Hulan [name]. This visit occurred during the SARS-CoV-2 public health emergency.  Safety protocols were in place, including screening questions prior to the visit, additional usage of staff PPE, and extensive cleaning of exam room while observing appropriate contact time as indicated for disinfecting solutions.   I'm seeing this patient by the request  of:  Joshua Borg, MD  CC: Bilateral shoulder pain  [id number]  Joshua [name] is a 85 y.o. male coming in with complaint of bilateral shoulder pain. L>R. Pain increases with barometric pressure changes. Pain is located in anterior joint for one year. Pain with flexion. Denies any radiating symptoms. Use IBU prn.  Does not remember any true injury.  Has been worsening over some time but then all of a sudden started to having improvement again.  Rates the severity of pain now is 2 out of 10.  Still is uncomfortable at night.       Past Medical History:  Diagnosis Date  . AAA (abdominal aortic aneurysm) ([place])    MONITORED BY Joshua Joshua [name]--  STABLE  AT 3.7  . Allergic rhinitis   . Arthritis   . Asymptomatic stenosis of left carotid artery without infarction    MILD DISEASE  . BPH (benign prostatic hypertrophy)   . Diverticulosis of colon   . History of cardiomyopathy    IDIOPATHIC-- PER [facility] NOTE 2013 , CURRENTLY RESOLVED  . History of kidney stones   . Hyperlipidemia   . Hypertension   . Peripheral vascular disease ([place])   . Phimosis   . Right carotid artery occlusion    CHRONIC   WITHOUT INFARTION  . Stroke [facility])     Right eye  . Type 2 diabetes mellitus ([place])   . Wears hearing aid    Past Surgical History:  Procedure Laterality Date  . CARDIOVASCULAR STRESS TEST  JUNE 2008  Joshua [name]  . CATARACT EXTRACTION W/ INTRAOCULAR LENS IMPLANT Right     . CIRCUMCISION N/A 7/24/[name]   Procedure: CIRCUMCISION ADULT;  Surgeon: Joshua So, MD;  Location[facility];  Service: Urology;  Laterality: N/A;  . CYSTO/  RIGHT URETERAL STENT PLACEMENT  10-16-2003  . EXTRACORPOREAL SHOCK WAVE LITHOTRIPSY Right 2005  . EYE SURGERY    . JOINT REPLACEMENT Bilateral 2008  2009   Knee  . LUMBAR LAMINECTOMY/DECOMPRESSION MICRODISCECTOMY Bilateral 06/08/2016   Procedure: Bilateral Microlumbar decompression L4-L5 and L5-S1;  Surgeon: Joshua Day, MD;  Location: [facility];  Service: Orthopedics;  Laterality: Bilateral;  Requests 2 hours  . TOTAL KNEE ARTHROPLASTY Bilateral RIGHT 10-08-2007;   LEFT 09-30-2008  . TRANSTHORACIC ECHOCARDIOGRAM  10-21-2009   LVEF 50%   Social History   Socioeconomic History  . Marital status: Widowed    Spouse name: Not on file  . Number of children: Not on file  . Years of education: Not on file  . Highest education level: Not on file  Occupational History  . 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 Smoking status: Former Smoker    Packs/[name]: 0.75    Years: 25.00    Pack years: 18.75    Types: Cigarettes    Quit date: 07/25/1982    Years since quitting: 36.7  . Smokeless  tobacco: Never Used  Substance and Sexual Activity  . Alcohol use: [name]    Alcohol/week: 0.0 standard drinks    Comment: recovering alcoholic  [id number]  . Drug use: [name]  . Sexual activity: Not on file  Other Topics Concern  . Not on file  Social History Narrative  . Not on file   Social Determinants of Health   Financial Resource Strain:   . Difficulty of Paying Living Expenses: Not on file  Food Insecurity:   . Worried About Charity fundraiser in the Last Year: Not on file  . Ran Out of Food in the Last Year: Not on file  Transportation Needs:   . Lack of Transportation (Medical): Not on file  . Lack of Transportation (Non-Medical): Not on file   Physical Activity:   . Days of Exercise per Week: Not on file  . Minutes of Exercise per Session: Not on file  Stress:   . Feeling of Stress : Not on file  Social Connections:   . Frequency of Communication with Friends and Family: Not on file  . Frequency of Social Gatherings with Friends and Family: Not on file  . Attends Religious Services: Not on file  . Active Member of Clubs or Organizations: Not on file  . Attends Archivist Meetings: Not on file  . Marital Status: Not on file   [name] Known Allergies Family History  Problem Relation Age of Onset  . Heart disease Mother        CHF  Before age 61  . COPD Mother   . Kidney disease Mother   . Hypertension Mother   . Asthma Mother   . Depression Mother   . ALS Father   . Diabetes Father   . Colon cancer Neg Hx     Current Outpatient Medications (Endocrine & Metabolic):  .  empagliflozin (JARDIANCE) 25 MG TABS tablet, Take 25 mg by mouth daily. Marland Kitchen  glipiZIDE (GLUCOTROL XL) 10 MG 24 hr tablet, TAKE 1 TABLET BY MOUTH DAILY WITH BREAKFAST .  metFORMIN (GLUCOPHAGE) 1000 MG tablet, Take 1 tablet (1,000 mg total) by mouth 2 (two) times daily.  Current Outpatient Medications (Cardiovascular):  .  atorvastatin (LIPITOR) 40 MG tablet, TAKE ONE (1) TABLET BY MOUTH EVERY [name] .  carvedilol (COREG) 12.5 MG tablet, TAKE 1/2 TABLET BY MOUTH TWICE DAILY WITH MEALS .  hydrochlorothiazide (HYDRODIURIL) 25 MG tablet, TAKE ONE (1) TABLET BY MOUTH EACH [name] .  losartan (COZAAR) 25 MG tablet, Take 1 tablet (25 mg total) by mouth 2 (two) times daily.   Current Outpatient Medications (Analgesics):  .  HYDROcodone-acetaminophen (NORCO) 5-325 MG tablet, Take 1 tablet by mouth every 6 (six) hours as needed for moderate pain. .  traMADol (ULTRAM) 50 MG tablet, Take 50 mg by mouth 3 (three) times daily as needed. For back pain   Current Outpatient Medications (Other):  .  beta carotene w/minerals (OCUVITE) tablet, Take 1 tablet by mouth  daily. .  clobetasol ointment (TEMOVATE) 0.05 %, APPLY TOPICALLY TWICE DAILY .  ketoconazole (NIZORAL) 2 % cream, Apply topically 2 (two) times daily. .  Multiple Vitamin (MULTIVITAMIN WITH MINERALS) TABS tablet, Take 1 tablet by mouth daily.   Reviewed prior external information including notes and imaging from  primary care provider As well as notes that were available from care everywhere and other healthcare systems.  Past medical history, social, surgical and family history all reviewed in electronic medical record.  [name] pertanent information unless stated  regarding to the chief complaint.   Review of Systems:  [name] headache, visual changes, nausea, vomiting, diarrhea, constipation, dizziness, abdominal pain, skin rash, fevers, chills, night sweats, weight loss, swollen lymph nodes, , joint swelling, chest pain, shortness of breath, mood changes. POSITIVE muscle aches, body aches  Objective  Blood pressure 128/72, height 5\' 11"  (1.803 m), weight 264 lb (119.7 kg), SpO2 99 %.   General: [name] apparent distress alert and oriented x3 mood and affect normal, dressed appropriately.  Patient is quite hard of hearing HEENT: Pupils equal, extraocular movements intact  Respiratory: Patient's speak in full sentences and does not appear short of breath  Cardiovascular: [name] lower extremity edema, non tender, [name] erythema  Skin: Warm dry intact with [name] signs of infection or rash on extremities or on axial skeleton.  Abdomen: Soft nontender  Neuro: Cranial nerves II through XII are intact, neurovascularly intact in all extremities with 2+ DTRs and 2+ pulses.  Lymph: [name] lymphadenopathy of posterior or anterior cervical chain or axillae bilaterally.  Gait antalgic MSK: Severe arthritic changes of multiple joints Neck exam does have some loss of lordosis, tender to palpation in the parascapular region.  Patient does have mild crepitus with the left shoulder more than the right.  Patient's rotator cuff  strength 4 out of 5 on the left and 4+ out of 5 on the right.  Positive impingement on the left.  Positive crossover on the left.  [name] significant atrophy of the musculature  [id number]; 15 additional minutes spent for Therapeutic exercises as stated in above notes.  This included exercises focusing on stretching, strengthening, with significant focus on eccentric aspects.   Long term goals include an improvement in range of motion, strength, endurance as well as avoiding reinjury. Patient's frequency would include in 1-2 times a [name], 3-5 times a week for a duration of 6-12 weeks. Shoulder Exercises that included:  Basic scapular stabilization to include adduction and depression of scapula Scaption, focusing on proper movement and good control Internal and External rotation utilizing a theraband, with elbow tucked at side entire time Rows with theraband    Proper technique shown and discussed handout in great detail with ATC.  All questions were discussed and answered.     Impression and Recommendations:     This case required medical decision making of moderate complexity. The above documentation has been reviewed and is accurate and complete Lyndal Pulley, DO       Note: This dictation was prepared with Dragon dictation along with smaller phrase technology. Any transcriptional errors that result from this process are unintentional.

## 2019-04-24 DIAGNOSIS — H353122 Nonexudative age-related macular degeneration, left eye, intermediate dry stage: Secondary | ICD-10-CM | POA: Diagnosis not present

## 2019-04-24 DIAGNOSIS — H34211 Partial retinal artery occlusion, right eye: Secondary | ICD-10-CM | POA: Diagnosis not present

## 2019-04-24 DIAGNOSIS — H35372 Puckering of macula, left eye: Secondary | ICD-10-CM | POA: Diagnosis not present

## 2019-04-24 DIAGNOSIS — H353113 Nonexudative age-related macular degeneration, right eye, advanced atrophic without subfoveal involvement: Secondary | ICD-10-CM | POA: Diagnosis not present

## 2019-05-22 ENCOUNTER — Ambulatory Visit: Payer: Medicare HMO | Admitting: Family Medicine

## 2019-05-22 ENCOUNTER — Encounter: Payer: Self-pay | Admitting: Family Medicine

## 2019-05-22 ENCOUNTER — Other Ambulatory Visit: Payer: Self-pay

## 2019-05-22 DIAGNOSIS — M12811 Other specific arthropathies, not elsewhere classified, right shoulder: Secondary | ICD-10-CM | POA: Diagnosis not present

## 2019-05-22 DIAGNOSIS — M12812 Other specific arthropathies, not elsewhere classified, left shoulder: Secondary | ICD-10-CM

## 2019-05-22 NOTE — Patient Instructions (Signed)
Doing great  Continue the exercises 2 times a week See me when you need me

## 2019-05-22 NOTE — Assessment & Plan Note (Signed)
Patient is doing very well with conservative therapy at this time.  Discussed icing regimen and home exercises, and discussed continuing the therapy at the moment and follow-up with me as needed

## 2019-05-22 NOTE — Progress Notes (Signed)
[facility] [address] [place] Phone: [phone] Subjective:   I [name] am serving as a Education administrator for Dr. Hulan Saas.  This visit occurred during the SARS-CoV-2 public health emergency.  Safety protocols were in place, including screening questions prior to the visit, additional usage of staff PPE, and extensive cleaning of exam room while observing appropriate contact time as indicated for disinfecting solutions.   I'm seeing this patient by the request  of:  Biagio Borg, MD  CC: Shoulder pain bilaterally follow-up  [id number]   04/10/2019 Patient has signs and symptoms that is consistent with more of a bilateral rotator cuff arthropathy.  X-rays further ordered to evaluate the amount of arthritis.  Discussed icing regimen and home exercises.  Discussed topical anti-inflammatories that are over-the-counter natural supplementations.  Patient work with Product/process development scientist to learn home exercises in greater detail.  Patient will follow up with me again in 4 to 8 weeks worsening symptoms consider injection and then referral to formal physical therapy  Update 05/22/2019 [name] is a 85 y.o. male coming in with complaint of bilateral shoulder pain. Patient states he is doing a lot better.  Patient would state 90% better.  Has made significant progress.  Minimal discomfort overall.      Past Medical History:  Diagnosis Date  . AAA (abdominal aortic aneurysm) ([place])    MONITORED BY DR LAWSON LOV JUNE 2014--  STABLE  AT 3.7  . Allergic rhinitis   . Arthritis   . Asymptomatic stenosis of left carotid artery without infarction    MILD DISEASE  . BPH (benign prostatic hypertrophy)   . Diverticulosis of colon   . History of cardiomyopathy    IDIOPATHIC-- PER [facility] NOTE 2013 , CURRENTLY RESOLVED  . History of kidney stones   . Hyperlipidemia   . Hypertension   . Peripheral vascular disease ([place])   . Phimosis   . Right  carotid artery occlusion    CHRONIC   WITHOUT INFARTION  . Stroke [facility])     Right eye  . Type 2 diabetes mellitus ([place])   . Wears hearing aid    Past Surgical History:  Procedure Laterality Date  . CARDIOVASCULAR STRESS TEST  JUNE 2008  DR Wynonia Lawman  . CATARACT EXTRACTION W/ INTRAOCULAR LENS IMPLANT Right   . CIRCUMCISION N/A 08/30/2012   Procedure: CIRCUMCISION ADULT;  Surgeon: Malka So, MD;  Location[facility];  Service: Urology;  Laterality: N/A;  . CYSTO/  RIGHT URETERAL STENT PLACEMENT  10-16-2003  . EXTRACORPOREAL SHOCK WAVE LITHOTRIPSY Right 2005  . EYE SURGERY    . JOINT REPLACEMENT Bilateral 2008  2009   Knee  . LUMBAR LAMINECTOMY/DECOMPRESSION MICRODISCECTOMY Bilateral 06/08/2016   Procedure: Bilateral Microlumbar decompression L4-L5 and L5-S1;  Surgeon: Susa Day, MD;  Location: [facility];  Service: Orthopedics;  Laterality: Bilateral;  Requests 2 hours  . TOTAL KNEE ARTHROPLASTY Bilateral RIGHT 10-08-2007;   LEFT 09-30-2008  . TRANSTHORACIC ECHOCARDIOGRAM  10-21-2009   LVEF 50%   Social History   Socioeconomic History  . Marital status: Widowed    Spouse name: Not on file  . Number of children: Not on file  . Years of education: Not on file  . Highest education level: Not on file  Occupational History  . 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 Smoking status: Former  Smoker    Packs/day: 0.75    Years: 25.00    Pack years: 18.75    Types: Cigarettes    Quit date: 07/25/1982    Years since quitting: 36.8  . Smokeless tobacco: Never Used  Substance and Sexual Activity  . Alcohol use: No    Alcohol/week: 0.0 standard drinks    Comment: recovering alcoholic  [id number]  . Drug use: No  . Sexual activity: Not on file  Other Topics Concern  . Not on file  Social History Narrative  . Not on file   Social Determinants of Health   Financial Resource Strain:   .  Difficulty of Paying Living Expenses:   Food Insecurity:   . Worried About Charity fundraiser in the Last Year:   . Arboriculturist in the Last Year:   Transportation Needs:   . Film/video editor (Medical):   Marland Kitchen Lack of Transportation (Non-Medical):   Physical Activity:   . Days of Exercise per Week:   . Minutes of Exercise per Session:   Stress:   . Feeling of Stress :   Social Connections:   . Frequency of Communication with Friends and Family:   . Frequency of Social Gatherings with Friends and Family:   . Attends Religious Services:   . Active Member of Clubs or Organizations:   . Attends Archivist Meetings:   Marland Kitchen Marital Status:    No Known Allergies Family History  Problem Relation Age of Onset  . Heart disease Mother        CHF  Before age 35  . COPD Mother   . Kidney disease Mother   . Hypertension Mother   . Asthma Mother   . Depression Mother   . ALS Father   . Diabetes Father   . Colon cancer Neg Hx     Current Outpatient Medications (Endocrine & Metabolic):  .  empagliflozin (JARDIANCE) 25 MG TABS tablet, Take 25 mg by mouth daily. Marland Kitchen  glipiZIDE (GLUCOTROL XL) 10 MG 24 hr tablet, TAKE 1 TABLET BY MOUTH DAILY WITH BREAKFAST .  metFORMIN (GLUCOPHAGE) 1000 MG tablet, Take 1 tablet (1,000 mg total) by mouth 2 (two) times daily.  Current Outpatient Medications (Cardiovascular):  .  atorvastatin (LIPITOR) 40 MG tablet, TAKE ONE (1) TABLET BY MOUTH EVERY DAY .  carvedilol (COREG) 12.5 MG tablet, TAKE 1/2 TABLET BY MOUTH TWICE DAILY WITH MEALS .  hydrochlorothiazide (HYDRODIURIL) 25 MG tablet, TAKE ONE (1) TABLET BY MOUTH EACH DAY .  losartan (COZAAR) 25 MG tablet, Take 1 tablet (25 mg total) by mouth 2 (two) times daily.   Current Outpatient Medications (Analgesics):  .  HYDROcodone-acetaminophen (NORCO) 5-325 MG tablet, Take 1 tablet by mouth every 6 (six) hours as needed for moderate pain. .  traMADol (ULTRAM) 50 MG tablet, Take 50 mg by mouth 3  (three) times daily as needed. For back pain   Current Outpatient Medications (Other):  .  beta carotene w/minerals (OCUVITE) tablet, Take 1 tablet by mouth daily. .  clobetasol ointment (TEMOVATE) 0.05 %, APPLY TOPICALLY TWICE DAILY .  ketoconazole (NIZORAL) 2 % cream, Apply topically 2 (two) times daily. .  Multiple Vitamin (MULTIVITAMIN WITH MINERALS) TABS tablet, Take 1 tablet by mouth daily.   Reviewed prior external information including notes and imaging from  primary care provider As well as notes that were available from care everywhere and other healthcare systems.  Past medical history, social, surgical and family history all reviewed  in electronic medical record.  No pertanent information unless stated regarding to the chief complaint.   Review of Systems:  No headache, visual changes, nausea, vomiting, diarrhea, constipation, dizziness, abdominal pain, skin rash, fevers, chills, night sweats, weight loss, swollen lymph nodes, body aches, joint swelling, chest pain, shortness of breath, mood changes. POSITIVE muscle aches  Objective  Blood pressure 100/82, pulse (!) 59, height 5\' 11"  (1.803 m), weight 262 lb (118.8 kg), SpO2 94 %.   General: No apparent distress alert and oriented x3 mood and affect normal, dressed appropriately.  HEENT: Pupils equal, extraocular movements intact  Respiratory: Patient's speak in full sentences and does not appear short of breath  Cardiovascular: No lower extremity edema, non tender, no erythema  Neuro: Cranial nerves II through XII are intact, neurovascularly intact in all extremities with 2+ DTRs and 2+ pulses.  Gait normal with good balance and coordination.  MSK:    Arthritic changes of multiple joints.  Shoulders bilaterally do still show some mild crepitus left greater than right.  Mild weakness with 4 out of 5 strength bilaterally.  Good grip strength.  Neck does have some decreased range of motion.    Impression and Recommendations:       The above documentation has been reviewed and is accurate and complete Lyndal Pulley, DO       Note: This dictation was prepared with Dragon dictation along with smaller phrase technology. Any transcriptional errors that result from this process are unintentional.

## 2019-05-25 DIAGNOSIS — B029 Zoster without complications: Secondary | ICD-10-CM | POA: Diagnosis not present

## 2019-05-25 DIAGNOSIS — R21 Rash and other nonspecific skin eruption: Secondary | ICD-10-CM | POA: Diagnosis not present

## 2019-05-30 ENCOUNTER — Other Ambulatory Visit: Payer: Self-pay | Admitting: Internal Medicine

## 2019-06-17 ENCOUNTER — Encounter: Payer: Self-pay | Admitting: Cardiology

## 2019-06-17 ENCOUNTER — Ambulatory Visit: Payer: Medicare HMO | Admitting: Cardiology

## 2019-06-17 ENCOUNTER — Other Ambulatory Visit: Payer: Self-pay

## 2019-06-17 VITALS — BP 110/60 | HR 76 | Ht 71.0 in | Wt 254.0 lb

## 2019-06-17 DIAGNOSIS — I739 Peripheral vascular disease, unspecified: Secondary | ICD-10-CM

## 2019-06-17 DIAGNOSIS — I119 Hypertensive heart disease without heart failure: Secondary | ICD-10-CM | POA: Diagnosis not present

## 2019-06-17 DIAGNOSIS — E119 Type 2 diabetes mellitus without complications: Secondary | ICD-10-CM | POA: Diagnosis not present

## 2019-06-17 DIAGNOSIS — I420 Dilated cardiomyopathy: Secondary | ICD-10-CM | POA: Diagnosis not present

## 2019-06-17 DIAGNOSIS — I2510 Atherosclerotic heart disease of native coronary artery without angina pectoris: Secondary | ICD-10-CM

## 2019-06-17 NOTE — Progress Notes (Signed)
Cardiology Office Note:    Date:  06/17/2019   ID:  [name], Joshua 16-Jun-1934, MRN [id number]  PCP:  Biagio Borg, MD  Cardiologist:  Jenne Campus, MD    Referring MD: Biagio Borg, MD   Chief Complaint  Patient presents with  . Follow-up  Doing well  History of Present Illness:    [name] is a 85 y.o. male with past medical history significant for abdominal aortic aneurysm, cardiac arterial disease, cardiomyopathy with ejection fraction 4045%, essential hypertension, dyslipidemia.  Comes today to my office for follow-up.  Overall he seems to be doing well.  Denies have any chest pain tightness squeezing pressure burning chest.  He does have 2 dogs that he walk with all the time and have no difficulty doing it.  No swelling of lower extremities no proximal nocturnal dyspnea.  Past Medical History:  Diagnosis Date  . AAA (abdominal aortic aneurysm) ([place])    MONITORED BY DR LAWSON LOV JUNE 2014--  STABLE  AT 3.7  . Allergic rhinitis   . Arthritis   . Asymptomatic stenosis of left carotid artery without infarction    MILD DISEASE  . BPH (benign prostatic hypertrophy)   . Diverticulosis of colon   . History of cardiomyopathy    IDIOPATHIC-- PER [facility] NOTE 2013 , CURRENTLY RESOLVED  . History of kidney stones   . Hyperlipidemia   . Hypertension   . Peripheral vascular disease ([place])   . Phimosis   . Right carotid artery occlusion    CHRONIC   WITHOUT INFARTION  . Stroke [facility])     Right eye  . Type 2 diabetes mellitus ([place])   . Wears hearing aid     Past Surgical History:  Procedure Laterality Date  . CARDIOVASCULAR STRESS TEST  JUNE 2008  DR Wynonia Lawman  . CATARACT EXTRACTION W/ INTRAOCULAR LENS IMPLANT Right   . CIRCUMCISION N/A 08/30/2012   Procedure: CIRCUMCISION ADULT;  Surgeon: Malka So, MD;  Location: [facility];  Service: Urology;  Laterality: N/A;  . CYSTO/  RIGHT URETERAL STENT PLACEMENT  10-16-2003  . EXTRACORPOREAL SHOCK  WAVE LITHOTRIPSY Right 2005  . EYE SURGERY    . JOINT REPLACEMENT Bilateral 2008  2009   Knee  . LUMBAR LAMINECTOMY/DECOMPRESSION MICRODISCECTOMY Bilateral 06/08/2016   Procedure: Bilateral Microlumbar decompression L4-L5 and L5-S1;  Surgeon: Susa Day, MD;  [facility];  Service: Orthopedics;  Laterality: Bilateral;  Requests 2 hours  . TOTAL KNEE ARTHROPLASTY Bilateral RIGHT 10-08-2007;   LEFT 09-30-2008  . TRANSTHORACIC ECHOCARDIOGRAM  10-21-2009   LVEF 50%    Current Medications: Current Meds  Medication Sig  . atorvastatin (LIPITOR) 40 MG tablet TAKE ONE (1) TABLET BY MOUTH EVERY DAY  . beta carotene w/minerals (OCUVITE) tablet Take 1 tablet by mouth daily.  . carvedilol (COREG) 12.5 MG tablet TAKE 1/2 TABLET BY MOUTH TWICE DAILY WITH MEALS  . clobetasol ointment (TEMOVATE) 0.05 % APPLY TOPICALLY TWICE DAILY  . glipiZIDE (GLUCOTROL XL) 10 MG 24 hr tablet TAKE 1 TABLET BY MOUTH DAILY WITH BREAKFAST  . hydrochlorothiazide (HYDRODIURIL) 25 MG tablet TAKE ONE (1) TABLET BY MOUTH EACH DAY  . HYDROcodone-acetaminophen (NORCO) 5-325 MG tablet Take 1 tablet by mouth every 6 (six) hours as needed for moderate pain.  Marland Kitchen JARDIANCE 25 MG TABS tablet TAKE 1 TABLET BY MOUTH DAILY  . ketoconazole (NIZORAL) 2 % cream Apply topically 2 (two) times daily.  Marland Kitchen losartan (COZAAR) 25 MG tablet Take 1  tablet (25 mg total) by mouth 2 (two) times daily.  . metFORMIN (GLUCOPHAGE) 1000 MG tablet Take 1 tablet (1,000 mg total) by mouth 2 (two) times daily.  . Multiple Vitamin (MULTIVITAMIN WITH MINERALS) TABS tablet Take 1 tablet by mouth daily.  . traMADol (ULTRAM) 50 MG tablet Take 50 mg by mouth 3 (three) times daily as needed. For back pain     Allergies:   Patient has no known allergies.   Social History   Socioeconomic History  . Marital status: Widowed    Spouse name: Not on file  . Number of children: Not on file  . Years of education: Not on file  . Highest education level: Not on file    Occupational History  . 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 Smoking status: Former Smoker    Packs/day: 0.75    Years: 25.00    Pack years: 18.75    Types: Cigarettes    Quit date: 07/25/1982    Years since quitting: 36.9  . Smokeless tobacco: Never Used  Substance and Sexual Activity  . Alcohol use: No    Alcohol/week: 0.0 standard drinks    Comment: recovering alcoholic  [id number]  . Drug use: No  . Sexual activity: Not on file  Other Topics Concern  . Not on file  Social History Narrative  . Not on file   Social Determinants of Health   Financial Resource Strain:   . Difficulty of Paying Living Expenses:   Food Insecurity:   . Worried About Charity fundraiser in the Last Year:   . Arboriculturist in the Last Year:   Transportation Needs:   . Film/video editor (Medical):   Marland Kitchen Lack of Transportation (Non-Medical):   Physical Activity:   . Days of Exercise per Week:   . Minutes of Exercise per Session:   Stress:   . Feeling of Stress :   Social Connections:   . Frequency of Communication with Friends and Family:   . Frequency of Social Gatherings with Friends and Family:   . Attends Religious Services:   . Active Member of Clubs or Organizations:   . Attends Archivist Meetings:   Marland Kitchen Marital Status:      Family History: The patient's family history includes ALS in his father; Asthma in his mother; COPD in his mother; Depression in his mother; Diabetes in his father; Heart disease in his mother; Hypertension in his mother; Kidney disease in his mother. There is no history of Colon cancer. ROS:   Please see the history of present illness.    All 14 point review of systems negative except as described per history of present illness  EKGs/Labs/Other Studies Reviewed:    Normal sinus rhythm, normal P interval, PVCs, incomplete right bundle branch block.  Recent  Labs: 03/15/2019: ALT 27; BUN 26; Creatinine, Ser 1.13; Hemoglobin 15.5; Platelets 142.0; Potassium 4.1; Sodium 139; TSH 2.70  Recent Lipid Panel    Component Value Date/Time   CHOL 134 03/15/2019 1454   TRIG 87.0 03/15/2019 1454   HDL 45.10 03/15/2019 1454   CHOLHDL 3 03/15/2019 1454   VLDL 17.4 03/15/2019 1454   LDLCALC 72 03/15/2019 1454   LDLDIRECT 92.0 02/06/2017 1119    Physical Exam:    VS:  BP 110/60   Pulse 76   Ht 5\' 11"  (1.803 m)   Wt  254 lb (115.2 kg)   SpO2 98%   BMI 35.43 kg/m     Wt Readings from Last 3 Encounters:  06/17/19 254 lb (115.2 kg)  05/22/19 262 lb (118.8 kg)  04/10/19 264 lb (119.7 kg)     GEN:  Well nourished, well developed in no acute distress HEENT: Normal NECK: No JVD; No carotid bruits LYMPHATICS: No lymphadenopathy CARDIAC: RRR, no murmurs, no rubs, no gallops RESPIRATORY:  Clear to auscultation without rales, wheezing or rhonchi  ABDOMEN: Soft, non-tender, non-distended MUSCULOSKELETAL:  No edema; No deformity  SKIN: Warm and dry LOWER EXTREMITIES: no swelling NEUROLOGIC:  Alert and oriented x 3 PSYCHIATRIC:  Normal affect   ASSESSMENT:    1. Coronary artery calcification seen on CT scan   2. Dilated cardiomyopathy ([place])   3. Hypertensive heart disease without CHF   4. Peripheral vascular disease ([place])   5. Diabetes mellitus type 2, noninsulin dependent ([place])    PLAN:    In order of problems listed above:  1. Coronary artery disease calcification on the CT however asymptomatic previous testing done by Dr. Wynonia Lawman were negative.  We will continue monitoring. 2. Cardiomyopathy with ejection fraction 45%.  He is on ACE inhibitor as well as beta-blocker which I will continue.  That he does not have any signs and symptoms of congestive heart failure.  He is compensated 3. Hypertension blood pressure well controlled continue present management. Peripheral vascular disease I will schedule him to have abdominal aortic ultrasound to  follow-up his aneurysm. Dyslipidemia: I did review K PN which showed me excellent fasting profile with LDL of 72 HDL of 45 this is from March 15, 2019 I will continue present management.  Medication Adjustments/Labs and Tests Ordered: Current medicines are reviewed at length with the patient today.  Concerns regarding medicines are outlined above.  No orders of the defined types were placed in this encounter.  Medication changes: No orders of the defined types were placed in this encounter.   Signed, Park Liter, MD, [facility] 06/17/2019 3:41 PM    [place]

## 2019-06-17 NOTE — Patient Instructions (Signed)

## 2019-08-05 ENCOUNTER — Other Ambulatory Visit: Payer: Self-pay | Admitting: Cardiology

## 2019-08-14 ENCOUNTER — Other Ambulatory Visit: Payer: Self-pay | Admitting: Internal Medicine

## 2019-08-14 NOTE — Telephone Encounter (Signed)
Please refill as per office routine med refill policy (all routine meds refilled for 3 mo or monthly per pt preference up to one year from last visit, then month to month grace period for 3 mo, then further med refills will have to be denied)  

## 2019-08-19 DIAGNOSIS — B0229 Other postherpetic nervous system involvement: Secondary | ICD-10-CM | POA: Diagnosis not present

## 2019-08-19 DIAGNOSIS — B029 Zoster without complications: Secondary | ICD-10-CM | POA: Diagnosis not present

## 2019-09-16 ENCOUNTER — Ambulatory Visit: Payer: Medicare HMO | Admitting: Internal Medicine

## 2019-09-18 ENCOUNTER — Encounter: Payer: Self-pay | Admitting: Internal Medicine

## 2019-09-18 ENCOUNTER — Other Ambulatory Visit: Payer: Self-pay

## 2019-09-18 ENCOUNTER — Ambulatory Visit (INDEPENDENT_AMBULATORY_CARE_PROVIDER_SITE_OTHER): Payer: Medicare HMO | Admitting: Internal Medicine

## 2019-09-18 VITALS — BP 120/70 | HR 68 | Temp 98.2°F | Ht 71.0 in | Wt 252.0 lb

## 2019-09-18 DIAGNOSIS — E119 Type 2 diabetes mellitus without complications: Secondary | ICD-10-CM | POA: Diagnosis not present

## 2019-09-18 DIAGNOSIS — I10 Essential (primary) hypertension: Secondary | ICD-10-CM

## 2019-09-18 DIAGNOSIS — E782 Mixed hyperlipidemia: Secondary | ICD-10-CM

## 2019-09-18 DIAGNOSIS — Z0000 Encounter for general adult medical examination without abnormal findings: Secondary | ICD-10-CM

## 2019-09-18 DIAGNOSIS — B0229 Other postherpetic nervous system involvement: Secondary | ICD-10-CM

## 2019-09-18 HISTORY — DX: Other postherpetic nervous system involvement: B02.29

## 2019-09-18 MED ORDER — HYDROCODONE-ACETAMINOPHEN 7.5-325 MG PO TABS: 1.0000 | ORAL_TABLET | Freq: Every evening | ORAL | 0 refills | Status: DC | PRN

## 2019-09-18 NOTE — Patient Instructions (Addendum)
Your A1c was Ok today at 7.0  Please take all new medication as prescribed - the pain medication  Please continue all other medications as before, and refills have been done if requested.  Please have the pharmacy call with any other refills you may need.  Please continue your efforts at being more active, low cholesterol diet, and weight control.  Please keep your appointments with your specialists as you may have planned  Please make an Appointment to return in 6 months, or sooner if needed, also with Lab Appointment for testing done 3-5 days before at the [facility] (so this is for TWO appointments - please see the scheduling desk as you leave)

## 2019-09-18 NOTE — Assessment & Plan Note (Addendum)
With persistent pain uncontrolled, for hydrocodone qhs prn,  to f/u any worsening symptoms or concerns  I spent 31 minutes in preparing to see the patient by review of recent labs, imaging and procedures, obtaining and reviewing separately obtained history, communicating with the patient and family or caregiver, ordering medications, tests or procedures, and documenting clinical information in the EHR including the differential Dx, treatment, and any further evaluation and other management of phn, htn, hld, hypercalcemia, htn, dm

## 2019-09-18 NOTE — Progress Notes (Signed)
Subjective:    Patient ID: [name], male    DOB: [birth year], 85 y.o.   MRN: [id number]  HPI  Here to f/u; overall doing ok,  Pt denies chest pain, increasing sob or doe, wheezing, orthopnea, PND, increased LE swelling, palpitations, dizziness or syncope.  Pt denies new neurological symptoms such as new headache, or facial or extremity weakness or numbness.  Pt denies polydipsia, polyuria, or low sugar episode.  Pt states overall good compliance with meds, mostly trying to follow appropriate diet, with wt overall stable,  but little exercise however. Did have shingles outbreak to just under right breast area about 3 mo ago now resolved, despite having the shingrix 2019. Still having PHN, tx with further tramadol but prior hydrocodone worked better.  Very difficult to sleep at night. Gabapentin, tylneol and alleve no help.  Past Medical History:  Diagnosis Date  . AAA (abdominal aortic aneurysm) ([place])    MONITORED BY DR LAWSON LOV JUNE 2014--  STABLE  AT 3.7  . Allergic rhinitis   . Arthritis   . Asymptomatic stenosis of left carotid artery without infarction    MILD DISEASE  . BPH (benign prostatic hypertrophy)   . Diverticulosis of colon   . History of cardiomyopathy    IDIOPATHIC-- PER [facility] NOTE 2013 , CURRENTLY RESOLVED  . History of kidney stones   . Hyperlipidemia   . Hypertension   . Peripheral vascular disease ([place])   . Phimosis   . Right carotid artery occlusion    CHRONIC   WITHOUT INFARTION  . Stroke [facility])     Right eye  . Type 2 diabetes mellitus ([place])   . Wears hearing aid    Past Surgical History:  Procedure Laterality Date  . CARDIOVASCULAR STRESS TEST  JUNE 2008  DR Wynonia Lawman  . CATARACT EXTRACTION W/ INTRAOCULAR LENS IMPLANT Right   . CIRCUMCISION N/A 08/30/2012   Procedure: CIRCUMCISION ADULT;  Surgeon: Malka So, MD;  Location: [facility];  Service: Urology;  Laterality: N/A;  . CYSTO/  RIGHT URETERAL STENT PLACEMENT  10-16-2003  .  EXTRACORPOREAL SHOCK WAVE LITHOTRIPSY Right 2005  . EYE SURGERY    . JOINT REPLACEMENT Bilateral 2008  2009   Knee  . LUMBAR LAMINECTOMY/DECOMPRESSION MICRODISCECTOMY Bilateral 06/08/2016   Procedure: Bilateral Microlumbar decompression L4-L5 and L5-S1;  Surgeon: Susa Day, MD;  Location[facility];  Service: Orthopedics;  Laterality: Bilateral;  Requests 2 hours  . TOTAL KNEE ARTHROPLASTY Bilateral RIGHT 10-08-2007;   LEFT 09-30-2008  . TRANSTHORACIC ECHOCARDIOGRAM  10-21-2009   LVEF 50%    reports that he quit smoking about 37 years ago. His smoking use included cigarettes. He has a 18.75 pack-year smoking history. He has never used smokeless tobacco. He reports that he does not drink alcohol and does not use drugs. family history includes ALS in his father; Asthma in his mother; COPD in his mother; Depression in his mother; Diabetes in his father; Heart disease in his mother; Hypertension in his mother; Kidney disease in his mother. No Known Allergies Current Outpatient Medications on File Prior to Visit  Medication Sig Dispense Refill  . atorvastatin (LIPITOR) 40 MG tablet TAKE ONE (1) TABLET BY MOUTH EVERY DAY 90 tablet 1  . beta carotene w/minerals (OCUVITE) tablet Take 1 tablet by mouth daily.    . carvedilol (COREG) 12.5 MG tablet TAKE 1/2 TABLET BY MOUTH TWICE DAILY WITH MEALS 90 tablet 1  . clobetasol ointment (TEMOVATE) 0.05 % APPLY TOPICALLY TWICE  DAILY 30 g 1  . dexamethasone (DECADRON) 4 MG tablet Take 8 mg by mouth daily.    Marland Kitchen gabapentin (NEURONTIN) 300 MG capsule Take 300 mg by mouth every 6 (six) hours as needed.    Marland Kitchen glipiZIDE (GLUCOTROL XL) 10 MG 24 hr tablet TAKE 1 TABLET BY MOUTH DAILY WITH BREAKFAST 90 tablet 3  . hydrochlorothiazide (HYDRODIURIL) 25 MG tablet TAKE ONE (1) TABLET BY MOUTH EACH DAY 90 tablet 2  . JARDIANCE 25 MG TABS tablet TAKE 1 TABLET BY MOUTH DAILY 90 tablet 2  . ketoconazole (NIZORAL) 2 % cream Apply topically 2 (two) times daily.    Marland Kitchen losartan  (COZAAR) 25 MG tablet TAKE ONE (1) TABLET BY MOUTH TWO (2) TIMES DAILY 180 tablet 2  . metFORMIN (GLUCOPHAGE) 1000 MG tablet TAKE ONE TABLET BY MOUTH TWICE DAILY 180 tablet 1  . Multiple Vitamin (MULTIVITAMIN WITH MINERALS) TABS tablet Take 1 tablet by mouth daily.    . traMADol (ULTRAM) 50 MG tablet Take 50 mg by mouth 3 (three) times daily as needed. For back pain     No current facility-administered medications on file prior to visit.   Review of Systems All otherwise neg per pt    Objective:   Physical Exam BP 120/70 (BP Location: Left Arm, Patient Position: Sitting, Cuff Size: Large)   Pulse 68   Temp 98.2 F (36.8 C) (Oral)   Ht 5\' 11"  (1.803 m)   Wt 252 lb (114.3 kg)   SpO2 97%   BMI 35.15 kg/m  VS noted,  Constitutional: Pt appears in NAD HENT: Head: NCAT.  Right Ear: External ear normal.  Left Ear: External ear normal.  Eyes: . Pupils are equal, round, and reactive to light. Conjunctivae and EOM are normal Nose: without d/c or deformity Neck: Neck supple. Gross normal ROM Cardiovascular: Normal rate and regular rhythm.   Pulmonary/Chest: Effort normal and breath sounds without rales or wheezing.  Abd:  Soft, NT, ND, + BS, no organomegaly Neurological: Pt is alert. At baseline orientation, motor grossly intact Skin: Skin is warm. No rashes, other new lesions, no LE edema Psychiatric: Pt behavior is normal without agitation  All otherwise neg per pt Lab Results  Component Value Date   WBC 8.7 03/15/2019   HGB 15.5 03/15/2019   HCT 45.9 03/15/2019   PLT 142.0 (L) 03/15/2019   GLUCOSE 126 (H) 03/15/2019   CHOL 134 03/15/2019   TRIG 87.0 03/15/2019   HDL 45.10 03/15/2019   LDLDIRECT 92.0 02/06/2017   LDLCALC 72 03/15/2019   ALT 27 03/15/2019   AST 19 03/15/2019   NA 139 03/15/2019   K 4.1 03/15/2019   CL 99 03/15/2019   CREATININE 1.13 03/15/2019   BUN 26 (H) 03/15/2019   CO2 31 03/15/2019   TSH 2.70 03/15/2019   PSA 2.15 07/14/2016   INR 1.1 09/24/2008     HGBA1C 7.7 (H) 03/15/2019   MICROALBUR <0.7 03/15/2019      Assessment & Plan:

## 2019-09-18 NOTE — Assessment & Plan Note (Signed)
stable overall by history and exam, recent data reviewed with pt, and pt to continue medical treatment as before,  to f/u any worsening symptoms or concerns  

## 2019-09-24 ENCOUNTER — Other Ambulatory Visit: Payer: Self-pay | Admitting: Internal Medicine

## 2019-10-02 ENCOUNTER — Other Ambulatory Visit: Payer: Self-pay | Admitting: Internal Medicine

## 2019-10-16 ENCOUNTER — Telehealth: Payer: Self-pay | Admitting: Internal Medicine

## 2019-10-16 MED ORDER — HYDROCODONE-ACETAMINOPHEN 7.5-325 MG PO TABS: 1.0000 | ORAL_TABLET | Freq: Every evening | ORAL | 0 refills | Status: DC | PRN

## 2019-10-16 NOTE — Telephone Encounter (Signed)
HYDROcodone-acetaminophen ([place]) 7.5-325 MG tablet  [facility] [place], [place] - [address] Phone:  [phone]  Fax:  [phone]     Last appt: 8.11.2021

## 2019-10-16 NOTE — Telephone Encounter (Signed)
Done erx 

## 2019-11-14 ENCOUNTER — Telehealth: Payer: Self-pay | Admitting: Internal Medicine

## 2019-11-14 MED ORDER — HYDROCODONE-ACETAMINOPHEN 7.5-325 MG PO TABS: 1.0000 | ORAL_TABLET | Freq: Every evening | ORAL | 0 refills | Status: DC | PRN

## 2019-11-14 NOTE — Telephone Encounter (Signed)
HYDROcodone-acetaminophen ([place]) 7.5-325 MG tablet  [facility] [place],  - [address] Phone:  [phone]  Fax:  [phone]     Last appt: 8.11.21 Next appt: not scheduled  If possible call in today, to get delivered tomorrow, [place] doesn't offer home deliveries on Saturday

## 2019-11-14 NOTE — Telephone Encounter (Signed)
Done erx 

## 2019-11-15 DIAGNOSIS — Z23 Encounter for immunization: Secondary | ICD-10-CM | POA: Diagnosis not present

## 2019-12-12 ENCOUNTER — Telehealth: Payer: Self-pay | Admitting: Internal Medicine

## 2019-12-12 NOTE — Telephone Encounter (Signed)
    Patient requesting refill for HYDROcodone-acetaminophen (NORCO) 7.5-325 MG tablet to [place], [place] - [address]

## 2019-12-13 ENCOUNTER — Other Ambulatory Visit: Payer: Self-pay | Admitting: Internal Medicine

## 2019-12-13 MED ORDER — HYDROCODONE-ACETAMINOPHEN 7.5-325 MG PO TABS: 1.0000 | ORAL_TABLET | Freq: Every evening | ORAL | 0 refills | Status: DC | PRN

## 2019-12-13 NOTE — Telephone Encounter (Signed)
   Patient calling for status of refill Advised Dr Jenny Reichmann not in office  Patient states he has no remaining medication

## 2019-12-13 NOTE — Telephone Encounter (Signed)
Done erx after checked pmpaware

## 2019-12-13 NOTE — Telephone Encounter (Signed)
Sent to Dr. John. 

## 2019-12-24 ENCOUNTER — Other Ambulatory Visit: Payer: Self-pay | Admitting: Internal Medicine

## 2019-12-24 NOTE — Telephone Encounter (Signed)
Please refill as per office routine med refill policy (all routine meds refilled for 3 mo or monthly per pt preference up to one year from last visit, then month to month grace period for 3 mo, then further med refills will have to be denied)  

## 2019-12-26 DIAGNOSIS — R69 Illness, unspecified: Secondary | ICD-10-CM | POA: Diagnosis not present

## 2020-01-01 ENCOUNTER — Other Ambulatory Visit: Payer: Self-pay | Admitting: Internal Medicine

## 2020-01-01 NOTE — Telephone Encounter (Signed)
Please refill as per office routine med refill policy (all routine meds refilled for 3 mo or monthly per pt preference up to one year from last visit, then month to month grace period for 3 mo, then further med refills will have to be denied)  

## 2020-01-14 ENCOUNTER — Telehealth: Payer: Self-pay | Admitting: Internal Medicine

## 2020-01-14 NOTE — Telephone Encounter (Signed)
   1.Medication Requested: HYDROcodone-acetaminophen (NORCO) 7.5-325 MG tablet  2. Pharmacy (Name, Street, [place]): [facility]  3. On Med List: yes  4. Last Visit with PCP: 09/18/19  5. Next visit date with PCP: n/a   Agent: Please be advised that RX refills may take up to 3 business days. We ask that you follow-up with your pharmacy.

## 2020-01-15 MED ORDER — HYDROCODONE-ACETAMINOPHEN 7.5-325 MG PO TABS: 1.0000 | ORAL_TABLET | Freq: Every evening | ORAL | 0 refills | Status: DC | PRN

## 2020-01-15 NOTE — Telephone Encounter (Signed)
Ok done erx  [facility] to let pt know, the [facility] policy for narcotic have changed so I will need to call her at some point to explain about signing a [facility] pain contract and having urinary drug screen done

## 2020-01-24 DIAGNOSIS — R69 Illness, unspecified: Secondary | ICD-10-CM | POA: Diagnosis not present

## 2020-02-12 ENCOUNTER — Telehealth: Payer: Self-pay | Admitting: Internal Medicine

## 2020-02-12 MED ORDER — HYDROCODONE-ACETAMINOPHEN 7.5-325 MG PO TABS: 1.0000 | ORAL_TABLET | Freq: Every evening | ORAL | 0 refills | Status: DC | PRN

## 2020-02-12 NOTE — Telephone Encounter (Signed)
Sent to Dr. John. 

## 2020-02-12 NOTE — Telephone Encounter (Signed)
Done erx  pmpawarenc reviewed 

## 2020-02-12 NOTE — Telephone Encounter (Signed)
HYDROcodone-acetaminophen (NORCO) 7.5-325 MG tablet [facility] - [place], [place] - [address] Phone:  [phone]  Fax:  [phone]     Last seen- 08.11.21 Next apt- N/A  Requesting a refill

## 2020-02-14 DIAGNOSIS — Z87442 Personal history of urinary calculi: Secondary | ICD-10-CM | POA: Insufficient documentation

## 2020-02-14 DIAGNOSIS — Z8679 Personal history of other diseases of the circulatory system: Secondary | ICD-10-CM | POA: Insufficient documentation

## 2020-02-14 DIAGNOSIS — M1990 Unspecified osteoarthritis, unspecified site: Secondary | ICD-10-CM | POA: Insufficient documentation

## 2020-02-14 DIAGNOSIS — I714 Abdominal aortic aneurysm, without rupture, unspecified: Secondary | ICD-10-CM | POA: Insufficient documentation

## 2020-02-14 DIAGNOSIS — I10 Essential (primary) hypertension: Secondary | ICD-10-CM | POA: Insufficient documentation

## 2020-02-14 DIAGNOSIS — E119 Type 2 diabetes mellitus without complications: Secondary | ICD-10-CM | POA: Insufficient documentation

## 2020-02-14 DIAGNOSIS — I6521 Occlusion and stenosis of right carotid artery: Secondary | ICD-10-CM | POA: Insufficient documentation

## 2020-02-14 DIAGNOSIS — I6522 Occlusion and stenosis of left carotid artery: Secondary | ICD-10-CM | POA: Insufficient documentation

## 2020-02-14 DIAGNOSIS — N471 Phimosis: Secondary | ICD-10-CM | POA: Insufficient documentation

## 2020-02-14 DIAGNOSIS — K5730 Diverticulosis of large intestine without perforation or abscess without bleeding: Secondary | ICD-10-CM | POA: Insufficient documentation

## 2020-02-14 DIAGNOSIS — Z974 Presence of external hearing-aid: Secondary | ICD-10-CM | POA: Insufficient documentation

## 2020-02-14 DIAGNOSIS — J309 Allergic rhinitis, unspecified: Secondary | ICD-10-CM | POA: Insufficient documentation

## 2020-02-14 DIAGNOSIS — I639 Cerebral infarction, unspecified: Secondary | ICD-10-CM | POA: Insufficient documentation

## 2020-02-17 ENCOUNTER — Ambulatory Visit: Payer: Medicare HMO | Admitting: Cardiology

## 2020-02-17 ENCOUNTER — Other Ambulatory Visit: Payer: Self-pay

## 2020-02-17 VITALS — BP 96/66 | HR 65 | Ht 71.0 in | Wt 252.0 lb

## 2020-02-17 DIAGNOSIS — I10 Essential (primary) hypertension: Secondary | ICD-10-CM

## 2020-02-17 DIAGNOSIS — I420 Dilated cardiomyopathy: Secondary | ICD-10-CM

## 2020-02-17 DIAGNOSIS — I6521 Occlusion and stenosis of right carotid artery: Secondary | ICD-10-CM

## 2020-02-17 DIAGNOSIS — I714 Abdominal aortic aneurysm, without rupture, unspecified: Secondary | ICD-10-CM

## 2020-02-17 DIAGNOSIS — I119 Hypertensive heart disease without heart failure: Secondary | ICD-10-CM

## 2020-02-17 NOTE — Progress Notes (Signed)
Cardiology Office Note:    Date:  02/17/2020   ID:  [name], [name] October 10, 1934, MRN [id number]  PCP:  Biagio Borg, MD  Cardiologist:  Jenne Campus, MD    Referring MD: Biagio Borg, MD   Chief Complaint  Patient presents with  . Follow-up  I am doing fine  History of Present Illness:    [name] is a 86 y.o. male with past medical history significant for ophthalmologic was last measurements 2019 3.6 cm, coronary artery disease with no recent issues, cardiomyopathy with baseline ejection fraction of 45%, essential hypertension, dyslipidemia.  He comes today 2 months of follow-up.  Overall he seems to be doing well.  He is hard of hearing so discussion somewhat difficult but he said he denies having any chest pain tightness pressure or burning in the chest.  He still trying to be active, he does have 2 dogs that he tried to walk with them.  No dizziness no passing out.  Past Medical History:  Diagnosis Date  . AAA (abdominal aortic aneurysm) ([place])    MONITORED BY DR LAWSON LOV JUNE 2014--  STABLE  AT 3.7  . Abdominal aneurysm without mention of rupture 07/24/2012  . Acute sinus infection 06/17/2009   Qualifier: Diagnosis of  By: Jenny Reichmann MD, [facility]   . Allergic rhinitis   . Anxiety state 07/20/2007  . Arthritis   . Asymptomatic stenosis of left carotid artery without infarction    MILD DISEASE  . Bilateral carotid artery stenosis   . BPH (benign prostatic hypertrophy)   . Coronary artery calcification seen on CT scan 09/05/2017  . Diabetes mellitus type 2, noninsulin dependent ([place]) 07/20/2007  . Dilated cardiomyopathy ([place]) 12/14/2018  . Diverticulosis of colon   . Hearing loss of both ears 10/23/2013  . History of cardiomyopathy    IDIOPATHIC-- PER [place] NOTE 2013 , CURRENTLY RESOLVED  . History of kidney stones   . HTN (hypertension) 01/14/2016  . Hypercalcemia 03/15/2019  . Hyperlipidemia   . Hypertension   . Hypertensive heart disease without CHF   . Obesity  (BMI 30-39.9)   . Paresthesia of both feet 07/14/2016  . Peripheral vascular disease ([place])   . Personal history of kidney stones   . Phimosis   . PHN (postherpetic neuralgia) 09/18/2019  . Preop exam for internal medicine 05/10/2016  . Preventative health care 01/14/2016  . Rash 05/01/2015  . Right carotid artery occlusion    CHRONIC   WITHOUT INFARTION  . Rotator cuff arthropathy of both shoulders 04/10/2019  . Spinal stenosis at L4-L5 level 06/08/2016  . Stroke [facility])     Right eye  . Type 2 diabetes mellitus ([place])   . Wears hearing aid     Past Surgical History:  Procedure Laterality Date  . CARDIOVASCULAR STRESS TEST  JUNE 2008  DR Wynonia Lawman  . CATARACT EXTRACTION W/ INTRAOCULAR LENS IMPLANT Right   . CIRCUMCISION N/A 08/30/2012   Procedure: CIRCUMCISION ADULT;  Surgeon: Malka So, MD;  Location: [facility];  Service: Urology;  Laterality: N/A;  . CYSTO/  RIGHT URETERAL STENT PLACEMENT  10-16-2003  . EXTRACORPOREAL SHOCK WAVE LITHOTRIPSY Right 2005  . EYE SURGERY    . JOINT REPLACEMENT Bilateral 2008  2009   Knee  . LUMBAR LAMINECTOMY/DECOMPRESSION MICRODISCECTOMY Bilateral 06/08/2016   Procedure: Bilateral Microlumbar decompression L4-L5 and L5-S1;  Surgeon: Susa Day, MD;  Location: [facility];  Service: Orthopedics;  Laterality: Bilateral;  Requests 2 hours  .  TOTAL KNEE ARTHROPLASTY Bilateral RIGHT 10-08-2007;   LEFT 09-30-2008  . TRANSTHORACIC ECHOCARDIOGRAM  10-21-2009   LVEF 50%    Current Medications: Current Meds  Medication Sig  . atorvastatin (LIPITOR) 40 MG tablet TAKE ONE (1) TABLET BY MOUTH EVERY DAY  . beta carotene w/minerals (OCUVITE) tablet Take 1 tablet by mouth daily.  . carvedilol (COREG) 12.5 MG tablet TAKE 1/2 TABLET BY MOUTH TWICE DAILY WITH MEALS  . clobetasol ointment (TEMOVATE) 0.05 % APPLY TOPICALLY TWICE DAILY  . dexamethasone (DECADRON) 4 MG tablet Take 8 mg by mouth daily.  Marland Kitchen gabapentin (NEURONTIN) 300 MG capsule Take 300 mg by mouth  every 6 (six) hours as needed.  Marland Kitchen glipiZIDE (GLUCOTROL XL) 10 MG 24 hr tablet TAKE 1 TABLET BY MOUTH DAILY WITH BREAKFAST  . hydrochlorothiazide (HYDRODIURIL) 25 MG tablet TAKE ONE (1) TABLET BY MOUTH EACH DAY  . HYDROcodone-acetaminophen (NORCO) 7.5-325 MG tablet Take 1 tablet by mouth at bedtime as needed for moderate pain.  Marland Kitchen JARDIANCE 25 MG TABS tablet TAKE 1 TABLET BY MOUTH DAILY  . ketoconazole (NIZORAL) 2 % cream Apply topically 2 (two) times daily.  Marland Kitchen losartan (COZAAR) 25 MG tablet TAKE ONE (1) TABLET BY MOUTH TWO (2) TIMES DAILY  . metFORMIN (GLUCOPHAGE) 1000 MG tablet TAKE ONE TABLET BY MOUTH TWICE DAILY  . Multiple Vitamin (MULTIVITAMIN WITH MINERALS) TABS tablet Take 1 tablet by mouth daily.     Allergies:   Patient has no known allergies.   Social History   Socioeconomic History  . Marital status: Widowed    Spouse name: Not on file  . Number of children: Not on file  . Years of education: Not on file  . Highest education level: Not on file  Occupational History  . 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 Smoking status: Former Smoker    Packs/day: 0.75    Years: 25.00    Pack years: 18.75    Types: Cigarettes    Quit date: 07/25/1982    Years since quitting: 37.5  . Smokeless tobacco: Never Used  Vaping Use  . Vaping Use: Never used  Substance and Sexual Activity  . Alcohol use: No    Alcohol/week: 0.0 standard drinks    Comment: recovering alcoholic  +[employer]BMW  . Drug use: No  . Sexual activity: Not on file  Other Topics Concern  . Not on file  Social History Narrative  . Not on file   Social Determinants of Health   Financial Resource Strain: Not on file  Food Insecurity: Not on file  Transportation Needs: Not on file  Physical Activity: Not on file  Stress: Not on file  Social Connections: Not on file     Family History: The patient's family history includes ALS in his  father; Asthma in his mother; COPD in his mother; Depression in his mother; Diabetes in his father; Heart disease in his mother; Hypertension in his mother; Kidney disease in his mother. There is no history of Colon cancer. ROS:   Please see the history of present illness.    All 14 point review of systems negative except as described per history of present illness  EKGs/Labs/Other Studies Reviewed:      Recent Labs: 03/15/2019: ALT 27; BUN 26; Creatinine, Ser 1.13; Hemoglobin 15.5; Platelets 142.0; Potassium 4.1; Sodium 139; TSH 2.70  Recent Lipid Panel    Component Value Date/Time  CHOL 134 03/15/2019 1454   TRIG 87.0 03/15/2019 1454   HDL 45.10 03/15/2019 1454   CHOLHDL 3 03/15/2019 1454   VLDL 17.4 03/15/2019 1454   LDLCALC 72 03/15/2019 1454   LDLDIRECT 92.0 02/06/2017 1119    Physical Exam:    VS:  BP 96/66 (BP Location: Left Arm, Patient Position: Sitting)   Pulse 65   Ht 5\' 11"  (1.803 m)   Wt 252 lb (114.3 kg)   SpO2 95%   BMI 35.15 kg/m     Wt Readings from Last 3 Encounters:  02/17/20 252 lb (114.3 kg)  09/18/19 252 lb (114.3 kg)  06/17/19 254 lb (115.2 kg)     GEN:  Well nourished, well developed in no acute distress HEENT: Normal NECK: No JVD; No carotid bruits LYMPHATICS: No lymphadenopathy CARDIAC: RRR, no murmurs, no rubs, no gallops RESPIRATORY:  Clear to auscultation without rales, wheezing or rhonchi  ABDOMEN: Soft, non-tender, non-distended MUSCULOSKELETAL:  No edema; No deformity  SKIN: Warm and dry LOWER EXTREMITIES: no swelling NEUROLOGIC:  Alert and oriented x 3 PSYCHIATRIC:  Normal affect   ASSESSMENT:    1. Right carotid artery occlusion   2. Hypertensive heart disease without CHF   3. Primary hypertension   4. Dilated cardiomyopathy ([place])   5. Abdominal aortic aneurysm (AAA) without rupture (H[facility])    PLAN:    In order of problems listed above:  1. Right carotid arterial occlusion, we will schedule him to have cardiac ultrasound  to check on it. 2. Hypertension.  Blood pressure seems to be well controlled continue present medications, 3. Dilated cardiomyopathy on appropriate medications because of low blood pressure have difficulty increasing the dosages of this medications he is on Coreg 12.5 as well as Cozaar 25 twice daily continued management. 4. Ophthalmologic aneurysm: We will schedule him to have abdominal aortic ultrasound. 5. Dyslipidemia: I do have K PN from March 15, 2019 showing LDL of 72 HDL 45.  He is on Lipitor 40 which I will continue.  We will check his fasting lipid profile today.   Medication Adjustments/Labs and Tests Ordered: Current medicines are reviewed at length with the patient today.  Concerns regarding medicines are outlined above.  No orders of the defined types were placed in this encounter.  Medication changes: No orders of the defined types were placed in this encounter.   Signed, Park Liter, MD, [facility] 02/17/2020 1:32 PM    [place]

## 2020-02-17 NOTE — Patient Instructions (Signed)
Medication Instructions:  Your physician recommends that you continue on your current medications as directed. Please refer to the Current Medication list given to you today.  *If you need a refill on your cardiac medications before your next appointment, please call your pharmacy*   Lab Work: Your physician recommends that you return for lab work today: lipid If you have labs (blood work) drawn today and your tests are completely normal, you will receive your results only by: Marland Kitchen MyChart Message (if you have MyChart) OR . A paper copy in the mail If you have any lab test that is abnormal or we need to change your treatment, we will call you to review the results.   Testing/Procedures: Your physician has requested that you have a carotid duplex. This test is an ultrasound of the carotid arteries in your neck. It looks at blood flow through these arteries that supply the brain with blood. Allow one hour for this exam. There are no restrictions or special instructions.  Your physician has requested that you have an abdominal aorta duplex. During this test, an ultrasound is used to evaluate the aorta. Allow 30 minutes for this exam. Do not eat after midnight the day before and avoid carbonated beverages    Follow-Up: At [facility], you and your health needs are our priority.  As part of our continuing mission to provide you with exceptional heart care, we have created designated Provider Care Teams.  These Care Teams include your primary Cardiologist (physician) and Advanced Practice Providers (APPs -  Physician Assistants and Nurse Practitioners) who all work together to provide you with the care you need, when you need it.  We recommend signing up for the patient portal called "MyChart".  Sign up information is provided on this After Visit Summary.  MyChart is used to connect with patients for Virtual Visits (Telemedicine).  Patients are able to view lab/test results, encounter notes,  upcoming appointments, etc.  Non-urgent messages can be sent to your provider as well.   To learn more about what you can do with MyChart, go to [email or web address].    Your next appointment:   6 month(s)  The format for your next appointment:   In Person  Provider:   Jenne Campus, MD   Other Instructions

## 2020-02-17 NOTE — Addendum Note (Signed)
Addended by: Senaida Ores on: 02/17/2020 01:39 PM   Modules accepted: Orders

## 2020-02-18 ENCOUNTER — Other Ambulatory Visit: Payer: Self-pay | Admitting: Internal Medicine

## 2020-02-18 LAB — LIPID PANEL
Chol/HDL Ratio: 3.7 ratio (ref 0.0–5.0)
Cholesterol, Total: 153 mg/dL (ref 100–199)
HDL: 41 mg/dL (ref >39)
LDL Chol Calc (NIH): 87 mg/dL (ref 0–99)
Triglycerides: 141 mg/dL (ref 0–149)
VLDL Cholesterol Cal: 25 mg/dL (ref 5–40)

## 2020-02-18 NOTE — Telephone Encounter (Signed)
Please refill as per office routine med refill policy (all routine meds refilled for 3 mo or monthly per pt preference up to one year from last visit, then month to month grace period for 3 mo, then further med refills will have to be denied)  

## 2020-02-20 ENCOUNTER — Telehealth: Payer: Self-pay

## 2020-02-20 DIAGNOSIS — E782 Mixed hyperlipidemia: Secondary | ICD-10-CM

## 2020-02-20 MED ORDER — ATORVASTATIN CALCIUM 80 MG PO TABS: 80.0000 mg | ORAL_TABLET | Freq: Every day | ORAL | 1 refills | Status: DC

## 2020-02-20 NOTE — Telephone Encounter (Signed)
Patient aware of results and agreed to Dr/ Agustin Cree recommendations. Medication sent. Patient aware to be fasting for labs in 6 weeks.

## 2020-02-20 NOTE — Telephone Encounter (Signed)
-----   Message from Park Liter, MD sent at 02/19/2020  8:30 PM EST ----- Cholesterol still on the higher side his LDL should be less than 70 and 87, I would propose to doubling the dose of Lipitor if he is willing to try.  If that is the case we will check fasting lipid profile AST ALT in 6 weeks

## 2020-03-03 ENCOUNTER — Other Ambulatory Visit: Payer: Self-pay | Admitting: Internal Medicine

## 2020-03-03 NOTE — Telephone Encounter (Signed)
Please refill as per office routine med refill policy (all routine meds refilled for 3 mo or monthly per pt preference up to one year from last visit, then month to month grace period for 3 mo, then further med refills will have to be denied)  

## 2020-03-10 ENCOUNTER — Other Ambulatory Visit: Payer: Self-pay | Admitting: Internal Medicine

## 2020-03-10 NOTE — Telephone Encounter (Signed)
Please refill as per office routine med refill policy (all routine meds refilled for 3 mo or monthly per pt preference up to one year from last visit, then month to month grace period for 3 mo, then further med refills will have to be denied)  

## 2020-03-11 ENCOUNTER — Ambulatory Visit (HOSPITAL_BASED_OUTPATIENT_CLINIC_OR_DEPARTMENT_OTHER)
Admission: RE | Admit: 2020-03-11 | Discharge: 2020-03-11 | Disposition: A | Discharge status: Home / Self Care | Payer: Medicare HMO | Source: Ambulatory Visit | Attending: Cardiology | Admitting: Cardiology

## 2020-03-11 ENCOUNTER — Other Ambulatory Visit: Payer: Self-pay

## 2020-03-11 DIAGNOSIS — E782 Mixed hyperlipidemia: Secondary | ICD-10-CM | POA: Diagnosis not present

## 2020-03-11 DIAGNOSIS — I119 Hypertensive heart disease without heart failure: Secondary | ICD-10-CM | POA: Diagnosis not present

## 2020-03-11 DIAGNOSIS — I6521 Occlusion and stenosis of right carotid artery: Secondary | ICD-10-CM | POA: Diagnosis not present

## 2020-03-11 DIAGNOSIS — I714 Abdominal aortic aneurysm, without rupture, unspecified: Secondary | ICD-10-CM

## 2020-03-12 LAB — ALT: ALT: 31 IU/L (ref 0–44)

## 2020-03-12 LAB — LIPID PANEL
Chol/HDL Ratio: 3 ratio (ref 0.0–5.0)
Cholesterol, Total: 124 mg/dL (ref 100–199)
HDL: 41 mg/dL (ref >39)
LDL Chol Calc (NIH): 67 mg/dL (ref 0–99)
Triglycerides: 83 mg/dL (ref 0–149)
VLDL Cholesterol Cal: 16 mg/dL (ref 5–40)

## 2020-03-12 LAB — AST: AST: 26 IU/L (ref 0–40)

## 2020-04-06 ENCOUNTER — Telehealth: Payer: Self-pay | Admitting: Internal Medicine

## 2020-04-06 MED ORDER — HYDROCODONE-ACETAMINOPHEN 7.5-325 MG PO TABS: 1.0000 | ORAL_TABLET | Freq: Every evening | ORAL | 0 refills | Status: DC | PRN

## 2020-04-06 NOTE — Telephone Encounter (Signed)
1.Medication Requested: HYDROcodone-acetaminophen (NORCO) 7.5-325 MG tablet    2. Pharmacy (Name, [place]): [place], [place] - [address]  3. On Med List: yes   4. Last Visit with PCP: 8.11.21   5. Next visit date with PCP: n/a    Agent: Please be advised that RX refills may take up to 3 business days. We ask that you follow-up with your pharmacy.

## 2020-04-16 ENCOUNTER — Telehealth: Payer: Self-pay | Admitting: Internal Medicine

## 2020-04-16 MED ORDER — KETOCONAZOLE 2 % EX CREA: TOPICAL_CREAM | Freq: Two times a day (BID) | CUTANEOUS | 1 refills | Status: DC | PRN

## 2020-04-16 NOTE — Telephone Encounter (Signed)
1.Medication Requested: ketoconazole (NIZORAL) 2 % cream    2. Pharmacy (Name, [place]): [facility], [place] - [address]  3. On Med List: yes   4. Last Visit with PCP: 8.11.21  5. Next visit date with PCP: n/a    Agent: Please be advised that RX refills may take up to 3 business days. We ask that you follow-up with your pharmacy.

## 2020-04-19 ENCOUNTER — Encounter: Payer: Self-pay | Admitting: Internal Medicine

## 2020-04-20 ENCOUNTER — Other Ambulatory Visit: Payer: Self-pay | Admitting: Internal Medicine

## 2020-05-01 ENCOUNTER — Other Ambulatory Visit: Payer: Self-pay | Admitting: Cardiology

## 2020-05-01 NOTE — Telephone Encounter (Signed)
Losartan approved and sent 

## 2020-05-06 ENCOUNTER — Telehealth: Payer: Self-pay | Admitting: Internal Medicine

## 2020-05-06 ENCOUNTER — Other Ambulatory Visit: Payer: Self-pay | Admitting: Cardiology

## 2020-05-06 NOTE — Telephone Encounter (Signed)
Patient requesting refill for HYDROcodone-acetaminophen (NORCO) 7.5-325 MG tablet  [facility]

## 2020-05-06 NOTE — Telephone Encounter (Signed)
Refill sent to pharmacy.   

## 2020-05-08 MED ORDER — HYDROCODONE-ACETAMINOPHEN 7.5-325 MG PO TABS: 1.0000 | ORAL_TABLET | Freq: Every evening | ORAL | 0 refills | Status: DC | PRN

## 2020-05-08 NOTE — Telephone Encounter (Signed)
Ok done erx  But please ask pt if he wants the pain clinic referral started now as it often takes at least 90 days, and I will not be able to do this type of pain medication refill after August 07 2020

## 2020-05-11 NOTE — Telephone Encounter (Signed)
Patient voicemail reached, notified patient to contact office in regards to if he wants to be referred to pain management due to of July 1 Dr. Jenny Reichmann will not be filling pain meds.

## 2020-05-21 ENCOUNTER — Other Ambulatory Visit: Payer: Self-pay | Admitting: Internal Medicine

## 2020-05-21 NOTE — Telephone Encounter (Signed)
Please refill as per office routine med refill policy (all routine meds refilled for 3 mo or monthly per pt preference up to one year from last visit, then month to month grace period for 3 mo, then further med refills will have to be denied)  

## 2020-05-27 ENCOUNTER — Other Ambulatory Visit: Payer: Self-pay

## 2020-05-27 ENCOUNTER — Ambulatory Visit (INDEPENDENT_AMBULATORY_CARE_PROVIDER_SITE_OTHER): Payer: Medicare HMO

## 2020-05-27 VITALS — BP 122/80 | HR 59 | Temp 98.1°F | Ht 71.0 in | Wt 247.4 lb

## 2020-05-27 DIAGNOSIS — Z0000 Encounter for general adult medical examination without abnormal findings: Secondary | ICD-10-CM | POA: Diagnosis not present

## 2020-05-27 NOTE — Progress Notes (Signed)
Subjective:   [name] is a 86 y.o. male who presents for Medicare Annual/Subsequent preventive examination.  Review of Systems    No ROS. Medicare Wellness Visit. Additional risk factors are reflected in social history. Cardiac Risk Factors include: advanced age (>33men, >37 women);diabetes mellitus;dyslipidemia;family history of premature cardiovascular disease;hypertension;male gender;obesity (BMI >30kg/m2)     Objective:    Today's Vitals   05/27/20 0934  BP: 122/80  Pulse: (!) 59  Temp: 98.1 F (36.7 C)  SpO2: 99%  Weight: 247 lb 6.4 oz (112.2 kg)  Height: 5\' 11"  (1.803 m)   Body mass index is 34.51 kg/m.  Advanced Directives 05/27/2020 08/31/2017 08/17/2016 06/08/2016 06/03/2016 08/05/2015 04/29/2015  Does Patient Have a Medical Advance Directive? Yes Yes Yes Yes No;Yes Yes Yes  Type of Advance Directive Living will;Healthcare Power of [place];Living will [place];Living will Healthcare Power of [place];Living will [place];Living will -  Does patient want to make changes to medical advance directive? No - Patient declined - - No - Patient declined No - Patient declined - -  Copy of [place] in Chart? No - copy requested No - copy requested No - copy requested No - copy requested - No - copy requested Yes  Would patient like information on creating a medical advance directive? - - - No - Patient declined - - -  Pre-existing out of facility DNR order (yellow form or pink MOST form) - - - - - - -    Current Medications (verified) Outpatient Encounter Medications as of 05/27/2020  Medication Sig  . atorvastatin (LIPITOR) 80 MG tablet TAKE ONE (1) TABLET BY MOUTH EVERY DAY  . beta carotene w/minerals (OCUVITE) tablet Take 1 tablet by mouth daily.  . carvedilol (COREG) 12.5 MG tablet TAKE 1/2 TABLET BY MOUTH TWICE DAILY WITH MEALS  . clobetasol ointment  (TEMOVATE) 0.05 % APPLY TOPICALLY TWICE DAILY  . dexamethasone (DECADRON) 4 MG tablet Take 8 mg by mouth daily.  Marland Kitchen gabapentin (NEURONTIN) 300 MG capsule Take 300 mg by mouth every 6 (six) hours as needed.  Marland Kitchen glipiZIDE (GLUCOTROL XL) 10 MG 24 hr tablet TAKE 1 TABLET BY MOUTH DAILY WITH BREAKFAST  . hydrochlorothiazide (HYDRODIURIL) 25 MG tablet TAKE ONE (1) TABLET BY MOUTH EACH DAY  . HYDROcodone-acetaminophen (NORCO) 7.5-325 MG tablet Take 1 tablet by mouth at bedtime as needed for moderate pain.  Marland Kitchen JARDIANCE 25 MG TABS tablet TAKE 1 TABLET BY MOUTH DAILY  . ketoconazole (NIZORAL) 2 % cream Apply topically 2 (two) times daily as needed for irritation.  Marland Kitchen losartan (COZAAR) 25 MG tablet TAKE ONE (1) TABLET BY MOUTH TWO (2) TIMES DAILY  . metFORMIN (GLUCOPHAGE) 1000 MG tablet TAKE ONE TABLET BY MOUTH TWICE DAILY  . Multiple Vitamin (MULTIVITAMIN WITH MINERALS) TABS tablet Take 1 tablet by mouth daily.   No facility-administered encounter medications on file as of 05/27/2020.    Allergies (verified) Patient has no known allergies.   History: Past Medical History:  Diagnosis Date  . AAA (abdominal aortic aneurysm) ([place])    MONITORED BY DR LAWSON LOV JUNE 2014--  STABLE  AT 3.7  . Abdominal aneurysm without mention of rupture 07/24/2012  . Acute sinus infection 06/17/2009   Qualifier: Diagnosis of  By: Jenny Reichmann MD, [place]   . Allergic rhinitis   . Anxiety state 07/20/2007  . Arthritis   . Asymptomatic stenosis of left carotid artery  without infarction    MILD DISEASE  . Bilateral carotid artery stenosis   . BPH (benign prostatic hypertrophy)   . Coronary artery calcification seen on CT scan 09/05/2017  . Diabetes mellitus type 2, noninsulin dependent ([place]) 07/20/2007  . Dilated cardiomyopathy ([place]) 12/14/2018  . Diverticulosis of colon   . Hearing loss of both ears 10/23/2013  . History of cardiomyopathy    IDIOPATHIC-- PER [place] NOTE 2013 , CURRENTLY RESOLVED  . History of kidney stones   .  HTN (hypertension) 01/14/2016  . Hypercalcemia 03/15/2019  . Hyperlipidemia   . Hypertension   . Hypertensive heart disease without CHF   . Obesity (BMI 30-39.9)   . Paresthesia of both feet 07/14/2016  . Peripheral vascular disease ([place])   . Personal history of kidney stones   . Phimosis   . PHN (postherpetic neuralgia) 09/18/2019  . Preop exam for internal medicine 05/10/2016  . Preventative health care 01/14/2016  . Rash 05/01/2015  . Right carotid artery occlusion    CHRONIC   WITHOUT INFARTION  . Rotator cuff arthropathy of both shoulders 04/10/2019  . Spinal stenosis at L4-L5 level 06/08/2016  . Stroke [facility])     Right eye  . Type 2 diabetes mellitus ([place])   . Wears hearing aid    Past Surgical History:  Procedure Laterality Date  . CARDIOVASCULAR STRESS TEST  JUNE 2008  DR Wynonia Lawman  . CATARACT EXTRACTION W/ INTRAOCULAR LENS IMPLANT Right   . CIRCUMCISION N/A 08/30/2012   Procedure: CIRCUMCISION ADULT;  Surgeon: Malka So, MD;  Location: [facility];  Service: Urology;  Laterality: N/A;  . CYSTO/  RIGHT URETERAL STENT PLACEMENT  10-16-2003  . EXTRACORPOREAL SHOCK WAVE LITHOTRIPSY Right 2005  . EYE SURGERY    . JOINT REPLACEMENT Bilateral 2008  2009   Knee  . LUMBAR LAMINECTOMY/DECOMPRESSION MICRODISCECTOMY Bilateral 06/08/2016   Procedure: Bilateral Microlumbar decompression L4-L5 and L5-S1;  Surgeon: Susa Day, MD;  Location: [facility];  Service: Orthopedics;  Laterality: Bilateral;  Requests 2 hours  . TOTAL KNEE ARTHROPLASTY Bilateral RIGHT 10-08-2007;   LEFT 09-30-2008  . TRANSTHORACIC ECHOCARDIOGRAM  10-21-2009   LVEF 50%   Family History  Problem Relation Age of Onset  . Heart disease Mother        CHF  Before age 50  . COPD Mother   . Kidney disease Mother   . Hypertension Mother   . Asthma Mother   . Depression Mother   . ALS Father   . Diabetes Father   . Colon cancer Neg Hx    Social History   Socioeconomic History  . Marital status: Widowed     Spouse name: Not on file  . Number of children: Not on file  . Years of education: Not on file  . Highest education level: Not on file  Occupational History  . 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 Smoking status: Former Smoker    Packs/day: 0.75    Years: 25.00    Pack years: 18.75    Types: Cigarettes    Quit date: 07/25/1982    Years since quitting: 37.8  . Smokeless tobacco: Never Used  Vaping Use  . Vaping Use: Never used  Substance and Sexual Activity  . Alcohol use: No    Alcohol/week: 0.0 standard drinks    Comment: recovering alcoholic  [id number]  . Drug use: No  .  Sexual activity: Not on file  Other Topics Concern  . Not on file  Social History Narrative  . Not on file   Social Determinants of Health   Financial Resource Strain: Low Risk   . Difficulty of Paying Living Expenses: Not hard at all  Food Insecurity: No Food Insecurity  . Worried About Charity fundraiser in the Last Year: Never true  . Ran Out of Food in the Last Year: Never true  Transportation Needs: No Transportation Needs  . Lack of Transportation (Medical): No  . Lack of Transportation (Non-Medical): No  Physical Activity: Insufficiently Active  . Days of Exercise per Week: 3 days  . Minutes of Exercise per Session: 30 min  Stress: No Stress Concern Present  . Feeling of Stress : Not at all  Social Connections: Unknown  . Frequency of Communication with Friends and Family: More than three times a week  . Frequency of Social Gatherings with Friends and Family: More than three times a week  . Attends Religious Services: 1 to 4 times per year  . Active Member of Clubs or Organizations: No  . Attends Archivist Meetings: 1 to 4 times per year  . Marital Status: Not on file    Tobacco Counseling Counseling given: Not Answered   Clinical Intake:  Pre-visit preparation completed: Yes  Pain : No/denies  pain     BMI - recorded: 34.51 Nutritional Risks: None Diabetes: Yes CBG done?: No Did pt. bring in CBG monitor from home?: No  How often do you need to have someone help you when you read instructions, pamphlets, or other written materials from your doctor or pharmacy?: 1 - Never What is the last grade level you completed in school?: Master's Degree in Psychology  Diabetic? yes  Interpreter Needed?: No  Information entered by :: Lisette Abu, LPN   Activities of Daily Living In your present state of health, do you have any difficulty performing the following activities: 05/27/2020  Hearing? Y  Vision? N  Difficulty concentrating or making decisions? N  Walking or climbing stairs? N  Dressing or bathing? N  Doing errands, shopping? N  Preparing Food and eating ? N  Using the Toilet? N  In the past six months, have you accidently leaked urine? N  Do you have problems with loss of bowel control? N  Managing your Medications? N  Managing your Finances? N  Housekeeping or managing your Housekeeping? N  Some recent data might be hidden    Patient Care Team: Biagio Borg, MD as PCP - General Wynonia Lawman Grace Bushy, MD as Consulting Physician (Cardiology)  Indicate any recent Medical Services you may have received from other than [facility] providers in the past year (date may be approximate).     Assessment:   This is a routine wellness examination for [place].  Hearing/Vision screen No exam data present  Dietary issues and exercise activities discussed: Current Exercise Habits: Home exercise routine, Type of exercise: walking, Time (Minutes): 30, Frequency (Times/Week): 3, Weekly Exercise (Minutes/Week): 90, Intensity: Mild, Exercise limited by: cardiac condition(s);orthopedic condition(s)  Goals    . Weight < 230 lb (104.327 kg)     Develop the motivation Had better health and more energy;  Motivation is 6 from 1-10;   Would try the [employer] Controllable  risk  for heart disease reviewed for goal setting: Includes; family hx; HTN; decreased renal function; obesity; sedentary lifestyle Educated regarding Metabolic syndrome / Factors that increase risk  for Heart Disease: Heart Healthy Diet; less sugar  Mediterranean Diet: eating primarily plant-based food such as fruits and vegetables, whole grains, legumes and nuts; replacing butter with healthy fats such as olive oil and canola oil Using herbs and spices instead of salt to flavor food Limiting red meat to no more than a few times a month Eating fish and poultry at least 2 times a week Getting plenty of exercise  Excess body fat around the waist Waist circumference for Men >40  Triglycerides > 150 HDL < 50 BP > 130/85 Glucose > 100 Goals are determined by the physician and based on other relevant data and risk Plan Lifestyle changes          Depression Screen PHQ 2/9 Scores 05/27/2020 09/18/2019 03/15/2019 09/12/2018 09/05/2017 02/06/2017 01/14/2016  PHQ - 2 Score 0 0 0 0 0 0 0  PHQ- 9 Score - - - - 0 0 -    Fall Risk Fall Risk  05/27/2020 09/18/2019 03/15/2019 09/12/2018 09/05/2017  Falls in the past year? 0 0 0 0 No  Number falls in past yr: 0 0 - - -  Injury with Fall? 0 0 - - -  Risk for fall due to : No Fall Risks Impaired balance/gait - - -  Follow up Falls evaluation completed Falls evaluation completed - - -    FALL RISK PREVENTION PERTAINING TO THE HOME:  Any stairs in or around the home? No  If so, are there any without handrails? No  Home free of loose throw rugs in walkways, pet beds, electrical cords, etc? Yes  Adequate lighting in your home to reduce risk of falls? Yes   ASSISTIVE DEVICES UTILIZED TO PREVENT FALLS:  Life alert? Yes  Use of a cane, walker or w/c? No  Grab bars in the bathroom? Yes  Shower chair or bench in shower? Yes  Elevated toilet seat or a handicapped toilet? Yes   TIMED UP AND GO:  Was the test performed? No .  Length of time to ambulate 10 feet:  0 sec.   Gait steady and fast without use of assistive device  Cognitive Function: Normal cognitive status assessed by direct observation by this Nurse Health Advisor. No abnormalities found.          Immunizations Immunization History  Administered Date(s) Administered  . Fluad Quad(high Dose 65+) 12/14/2018  . Influenza Whole 12/08/2008  . Influenza, High Dose Seasonal PF 11/15/2019  . Influenza,inj,Quad PF,6+ Mos 10/23/2013, 10/24/2014  . PFIZER(Purple Top)SARS-COV-2 Vaccination 04/08/2019, 05/09/2019, 12/11/2019  . Pneumococcal Conjugate-13 04/19/2013  . Pneumococcal Polysaccharide-23 08/22/2008  . Td 02/07/1993, 08/22/2008  . Tdap 09/12/2018  . Zoster Recombinat (Shingrix) 05/24/2017, 09/26/2017    TDAP status: Up to date  Flu Vaccine status: Up to date  Pneumococcal vaccine status: Up to date  Covid-19 vaccine status: Completed vaccines  Qualifies for Shingles Vaccine? Yes   Zostavax completed Yes   Shingrix Completed?: Yes  Screening Tests Health Maintenance  Topic Date Due  . OPHTHALMOLOGY EXAM  03/26/2020  . COVID-19 Vaccine (4 - Booster for Pfizer series) 06/09/2020  . INFLUENZA VACCINE  09/07/2020  . FOOT EXAM  09/17/2020  . TETANUS/TDAP  09/11/2028  . PNA vac Low Risk Adult  Completed  . HPV VACCINES  Aged Out    Health Maintenance  Health Maintenance Due  Topic Date Due  . OPHTHALMOLOGY EXAM  03/26/2020    Colorectal cancer screening: No longer required.   Lung Cancer Screening: (Low Dose CT Chest  recommended if Age 50-80 years, 84 pack-year currently smoking OR have quit w/in 15years.) does not qualify.   Lung Cancer Screening Referral: no  Additional Screening:  Hepatitis C Screening: does not qualify; Completed no  Vision Screening: Recommended annual ophthalmology exams for early detection of glaucoma and other disorders of the eye. Is the patient up to date with their annual eye exam?  Yes  Who is the provider or what is the name  of the office in which the patient attends annual eye exams? [facility] If pt is not established with a provider, would they like to be referred to a provider to establish care? No .   Dental Screening: Recommended annual dental exams for proper oral hygiene  Community Resource Referral / Chronic Care Management: CRR required this visit?  No   CCM required this visit?  No      Plan:     I have personally reviewed and noted the following in the patient's chart:   . Medical and social history . Use of alcohol, tobacco or illicit drugs  . Current medications and supplements . Functional ability and status . Nutritional status . Physical activity . Advanced directives . List of other physicians . Hospitalizations, surgeries, and ER visits in previous 12 months . Vitals . Screenings to include cognitive, depression, and falls . Referrals and appointments  In addition, I have reviewed and discussed with patient certain preventive protocols, quality metrics, and best practice recommendations. A written personalized care plan for preventive services as well as general preventive health recommendations were provided to patient.     Sheral Flow, LPN   03/29/2540   Nurse Notes:  Medications reviewed with patient; yes opioid use noted.

## 2020-05-27 NOTE — Patient Instructions (Addendum)
[name] , Thank you for taking time to come for your Medicare Wellness Visit. I appreciate your ongoing commitment to your health goals. Please review the following plan we discussed and let me know if I can assist you in the future.   Screening recommendations/referrals: Colonoscopy: last done 02/14/2014; no repeat due to age Recommended yearly ophthalmology/optometry visit for glaucoma screening and checkup Recommended yearly dental visit for hygiene and checkup  Vaccinations: Influenza vaccine: 11/15/2019 Pneumococcal vaccine: 08/22/2008, 04/19/2013 Tdap vaccine: 09/12/2018; due every 10 years Shingles vaccine: 05/24/2017, 09/26/2017   Covid-19: 04/08/2019, 05/09/2019, 12/11/2019  Advanced directives: Please bring a copy of your health care power of attorney and living will to the office at your convenience.  Conditions/risks identified: Yes; Reviewed health maintenance screenings with patient today and relevant education, vaccines, and/or referrals were provided. Please continue to do your personal lifestyle choices by: daily care of teeth and gums, regular physical activity (goal should be 5 days a week for 30 minutes), eat a healthy diet, avoid tobacco and drug use, limiting any alcohol intake, taking a low-dose aspirin (if not allergic or have been advised by your provider otherwise) and taking vitamins and minerals as recommended by your provider. Continue doing brain stimulating activities (puzzles, reading, adult coloring books, staying active) to keep memory sharp. Continue to eat heart healthy diet (full of fruits, vegetables, whole grains, lean protein, water--limit salt, fat, and sugar intake) and increase physical activity as tolerated.  Next appointment: Please schedule your next Medicare Wellness Visit with your Nurse Health Advisor in 1 year by calling [phone].  Preventive Care 49 Years and Older, Male Preventive care refers to lifestyle choices and visits with your health care  provider that can promote health and wellness. What does preventive care include?  A yearly physical exam. This is also called an annual well check.  Dental exams once or twice a year.  Routine eye exams. Ask your health care provider how often you should have your eyes checked.  Personal lifestyle choices, including:  Daily care of your teeth and gums.  Regular physical activity.  Eating a healthy diet.  Avoiding tobacco and drug use.  Limiting alcohol use.  Practicing safe sex.  Taking low doses of aspirin every day.  Taking vitamin and mineral supplements as recommended by your health care provider. What happens during an annual well check? The services and screenings done by your health care provider during your annual well check will depend on your age, overall health, lifestyle risk factors, and family history of disease. Counseling  Your health care provider may ask you questions about your:  Alcohol use.  Tobacco use.  Drug use.  Emotional well-being.  Home and relationship well-being.  Sexual activity.  Eating habits.  History of falls.  Memory and ability to understand (cognition).  Work and work Statistician. Screening  You may have the following tests or measurements:  Height, weight, and BMI.  Blood pressure.  Lipid and cholesterol levels. These may be checked every 5 years, or more frequently if you are over 75 years old.  Skin check.  Lung cancer screening. You may have this screening every year starting at age 77 if you have a 30-pack-year history of smoking and currently smoke or have quit within the past 15 years.  Fecal occult blood test (FOBT) of the stool. You may have this test every year starting at age 63.  Flexible sigmoidoscopy or colonoscopy. You may have a sigmoidoscopy every 5 years or a colonoscopy every  10 years starting at age 64.  Prostate cancer screening. Recommendations will vary depending on your family history and  other risks.  Hepatitis C blood test.  Hepatitis B blood test.  Sexually transmitted disease (STD) testing.  Diabetes screening. This is done by checking your blood sugar (glucose) after you have not eaten for a while (fasting). You may have this done every 1-3 years.  Abdominal aortic aneurysm (AAA) screening. You may need this if you are a current or former smoker.  Osteoporosis. You may be screened starting at age 9 if you are at high risk. Talk with your health care provider about your test results, treatment options, and if necessary, the need for more tests. Vaccines  Your health care provider may recommend certain vaccines, such as:  Influenza vaccine. This is recommended every year.  Tetanus, diphtheria, and acellular pertussis (Tdap, Td) vaccine. You may need a Td booster every 10 years.  Zoster vaccine. You may need this after age 75.  Pneumococcal 13-valent conjugate (PCV13) vaccine. One dose is recommended after age 29.  Pneumococcal polysaccharide (PPSV23) vaccine. One dose is recommended after age 5. Talk to your health care provider about which screenings and vaccines you need and how often you need them. This information is not intended to replace advice given to you by your health care provider. Make sure you discuss any questions you have with your health care provider. Document Released: 02/20/2015 Document Revised: 10/14/2015 Document Reviewed: 11/25/2014 Elsevier Interactive Patient Education  2017 [employer] Prevention in the Home Falls can cause injuries. They can happen to people of all ages. There are many things you can do to make your home safe and to help prevent falls. What can I do on the outside of my home?  Regularly fix the edges of walkways and driveways and fix any cracks.  Remove anything that might make you trip as you walk through a door, such as a raised step or threshold.  Trim any bushes or trees on the path to your  home.  Use bright outdoor lighting.  Clear any walking paths of anything that might make someone trip, such as rocks or tools.  Regularly check to see if handrails are loose or broken. Make sure that both sides of any steps have handrails.  Any raised decks and porches should have guardrails on the edges.  Have any leaves, snow, or ice cleared regularly.  Use sand or salt on walking paths during winter.  Clean up any spills in your garage right away. This includes oil or grease spills. What can I do in the bathroom?  Use night lights.  Install grab bars by the toilet and in the tub and shower. Do not use towel bars as grab bars.  Use non-skid mats or decals in the tub or shower.  If you need to sit down in the shower, use a plastic, non-slip stool.  Keep the floor dry. Clean up any water that spills on the floor as soon as it happens.  Remove soap buildup in the tub or shower regularly.  Attach bath mats securely with double-sided non-slip rug tape.  Do not have throw rugs and other things on the floor that can make you trip. What can I do in the bedroom?  Use night lights.  Make sure that you have a light by your bed that is easy to reach.  Do not use any sheets or blankets that are too big for your bed. They should not  hang down onto the floor.  Have a firm chair that has side arms. You can use this for support while you get dressed.  Do not have throw rugs and other things on the floor that can make you trip. What can I do in the kitchen?  Clean up any spills right away.  Avoid walking on wet floors.  Keep items that you use a lot in easy-to-reach places.  If you need to reach something above you, use a strong step stool that has a grab bar.  Keep electrical cords out of the way.  Do not use floor polish or wax that makes floors slippery. If you must use wax, use non-skid floor wax.  Do not have throw rugs and other things on the floor that can make you  trip. What can I do with my stairs?  Do not leave any items on the stairs.  Make sure that there are handrails on both sides of the stairs and use them. Fix handrails that are broken or loose. Make sure that handrails are as long as the stairways.  Check any carpeting to make sure that it is firmly attached to the stairs. Fix any carpet that is loose or worn.  Avoid having throw rugs at the top or bottom of the stairs. If you do have throw rugs, attach them to the floor with carpet tape.  Make sure that you have a light switch at the top of the stairs and the bottom of the stairs. If you do not have them, ask someone to add them for you. What else can I do to help prevent falls?  Wear shoes that:  Do not have high heels.  Have rubber bottoms.  Are comfortable and fit you well.  Are closed at the toe. Do not wear sandals.  If you use a stepladder:  Make sure that it is fully opened. Do not climb a closed stepladder.  Make sure that both sides of the stepladder are locked into place.  Ask someone to hold it for you, if possible.  Clearly mark and make sure that you can see:  Any grab bars or handrails.  First and last steps.  Where the edge of each step is.  Use tools that help you move around (mobility aids) if they are needed. These include:  Canes.  Walkers.  Scooters.  Crutches.  Turn on the lights when you go into a dark area. Replace any light bulbs as soon as they burn out.  Set up your furniture so you have a clear path. Avoid moving your furniture around.  If any of your floors are uneven, fix them.  If there are any pets around you, be aware of where they are.  Review your medicines with your doctor. Some medicines can make you feel dizzy. This can increase your chance of falling. Ask your doctor what other things that you can do to help prevent falls. This information is not intended to replace advice given to you by your health care provider. Make  sure you discuss any questions you have with your health care provider. Document Released: 11/20/2008 Document Revised: 07/02/2015 Document Reviewed: 02/28/2014 Elsevier Interactive Patient Education  2017 [employer].

## 2020-06-04 ENCOUNTER — Telehealth: Payer: Self-pay | Admitting: Internal Medicine

## 2020-06-04 NOTE — Telephone Encounter (Signed)
OK to contact pt  Please remind pt I will no longer be able to prescribe the pain medication after July 1  Does the patient want to be referred to pain management to continue this? We are asking because it takes up to 90 days sometimes to complete the referral process 

## 2020-06-05 NOTE — Telephone Encounter (Signed)
Patient hung up, called a second time and left a voicemail for patient to call office back.

## 2020-06-24 ENCOUNTER — Other Ambulatory Visit: Payer: Self-pay | Admitting: Internal Medicine

## 2020-06-24 NOTE — Telephone Encounter (Signed)
Please refill as per office routine med refill policy (all routine meds refilled for 3 mo or monthly per pt preference up to one year from last visit, then month to month grace period for 3 mo, then further med refills will have to be denied)  

## 2020-06-25 NOTE — Telephone Encounter (Signed)
Patient calling for status of refill of Metformin. He has none remaining

## 2020-06-26 NOTE — Telephone Encounter (Signed)
Patient is completely out of medication as of today.

## 2020-06-30 ENCOUNTER — Other Ambulatory Visit: Payer: Self-pay | Admitting: Internal Medicine

## 2020-07-01 DIAGNOSIS — I2510 Atherosclerotic heart disease of native coronary artery without angina pectoris: Secondary | ICD-10-CM | POA: Diagnosis not present

## 2020-07-01 DIAGNOSIS — I10 Essential (primary) hypertension: Secondary | ICD-10-CM | POA: Diagnosis not present

## 2020-07-01 DIAGNOSIS — B0223 Postherpetic polyneuropathy: Secondary | ICD-10-CM | POA: Diagnosis not present

## 2020-07-01 DIAGNOSIS — I700 Atherosclerosis of aorta: Secondary | ICD-10-CM | POA: Diagnosis not present

## 2020-07-01 DIAGNOSIS — E785 Hyperlipidemia, unspecified: Secondary | ICD-10-CM | POA: Diagnosis not present

## 2020-07-01 DIAGNOSIS — G8929 Other chronic pain: Secondary | ICD-10-CM | POA: Diagnosis not present

## 2020-07-01 DIAGNOSIS — R69 Illness, unspecified: Secondary | ICD-10-CM | POA: Diagnosis not present

## 2020-07-01 DIAGNOSIS — H3530 Unspecified macular degeneration: Secondary | ICD-10-CM | POA: Diagnosis not present

## 2020-07-01 DIAGNOSIS — E1151 Type 2 diabetes mellitus with diabetic peripheral angiopathy without gangrene: Secondary | ICD-10-CM | POA: Diagnosis not present

## 2020-07-03 ENCOUNTER — Telehealth: Payer: Self-pay

## 2020-07-03 MED ORDER — SILDENAFIL CITRATE 100 MG PO TABS: 50.0000 mg | ORAL_TABLET | Freq: Every day | ORAL | 11 refills | Status: DC | PRN

## 2020-07-03 NOTE — Telephone Encounter (Signed)
See below

## 2020-07-03 NOTE — Telephone Encounter (Signed)
Patient called and states that he would like a prescription for Viagra. States that he has been given samples in the past from Dr. Jenny Reichmann and now wants a prescription.

## 2020-07-03 NOTE — Telephone Encounter (Signed)
Ok done to [facility]

## 2020-07-03 NOTE — Telephone Encounter (Signed)
Patient returned call and states that he does not wish to have pain management referral at this time. Pain has improved.

## 2020-07-03 NOTE — Telephone Encounter (Signed)
Notified patient that medication has been sent.

## 2020-07-08 ENCOUNTER — Other Ambulatory Visit: Payer: Self-pay | Admitting: Cardiology

## 2020-07-08 NOTE — Telephone Encounter (Signed)
Refill sent to pharmacy.   

## 2020-07-29 ENCOUNTER — Telehealth: Payer: Self-pay | Admitting: Internal Medicine

## 2020-07-29 ENCOUNTER — Encounter: Payer: Self-pay | Admitting: Internal Medicine

## 2020-07-29 MED ORDER — METFORMIN HCL 1000 MG PO TABS: 1.0000 | ORAL_TABLET | Freq: Two times a day (BID) | ORAL | 0 refills | Status: DC

## 2020-07-29 NOTE — Telephone Encounter (Signed)
No note needed 

## 2020-07-29 NOTE — Telephone Encounter (Signed)
   Patient requesting short supply of Metformin Appointment scheduled 6/27

## 2020-07-29 NOTE — Addendum Note (Signed)
Addended by: Terence Lux A on: 07/29/2020 04:19 PM   Modules accepted: Orders

## 2020-08-03 ENCOUNTER — Encounter: Payer: Self-pay | Admitting: Internal Medicine

## 2020-08-03 ENCOUNTER — Other Ambulatory Visit: Payer: Self-pay

## 2020-08-03 ENCOUNTER — Ambulatory Visit (INDEPENDENT_AMBULATORY_CARE_PROVIDER_SITE_OTHER): Payer: Medicare HMO | Admitting: Internal Medicine

## 2020-08-03 VITALS — BP 112/70 | HR 54 | Temp 98.1°F | Ht 71.0 in | Wt 246.0 lb

## 2020-08-03 DIAGNOSIS — E611 Iron deficiency: Secondary | ICD-10-CM

## 2020-08-03 DIAGNOSIS — E119 Type 2 diabetes mellitus without complications: Secondary | ICD-10-CM | POA: Diagnosis not present

## 2020-08-03 DIAGNOSIS — E538 Deficiency of other specified B group vitamins: Secondary | ICD-10-CM

## 2020-08-03 DIAGNOSIS — B0229 Other postherpetic nervous system involvement: Secondary | ICD-10-CM

## 2020-08-03 DIAGNOSIS — E782 Mixed hyperlipidemia: Secondary | ICD-10-CM | POA: Diagnosis not present

## 2020-08-03 DIAGNOSIS — I872 Venous insufficiency (chronic) (peripheral): Secondary | ICD-10-CM | POA: Insufficient documentation

## 2020-08-03 DIAGNOSIS — Z0001 Encounter for general adult medical examination with abnormal findings: Secondary | ICD-10-CM | POA: Diagnosis not present

## 2020-08-03 DIAGNOSIS — I10 Essential (primary) hypertension: Secondary | ICD-10-CM | POA: Diagnosis not present

## 2020-08-03 DIAGNOSIS — B359 Dermatophytosis, unspecified: Secondary | ICD-10-CM | POA: Insufficient documentation

## 2020-08-03 DIAGNOSIS — E559 Vitamin D deficiency, unspecified: Secondary | ICD-10-CM | POA: Diagnosis not present

## 2020-08-03 LAB — MICROALBUMIN / CREATININE URINE RATIO
Creatinine,U: 0 mg/dL
Microalb Creat Ratio: 0 mg/g (ref 0.0–30.0)
Microalb, Ur: 3.2 mg/dL — ABNORMAL HIGH (ref 0.0–1.9)

## 2020-08-03 LAB — BASIC METABOLIC PANEL
BUN: 19 mg/dL (ref 6–23)
CO2: 26 mEq/L (ref 19–32)
Calcium: 9.8 mg/dL (ref 8.4–10.5)
Chloride: 100 mEq/L (ref 96–112)
Creatinine, Ser: 0.97 mg/dL (ref 0.40–1.50)
GFR: 72.18 mL/min (ref >60.00)
Glucose, Bld: 105 mg/dL — ABNORMAL HIGH (ref 70–99)
Potassium: 3.8 mEq/L (ref 3.5–5.1)
Sodium: 139 mEq/L (ref 135–145)

## 2020-08-03 LAB — CBC WITH DIFFERENTIAL/PLATELET
Basophils Absolute: 0 10*3/uL (ref 0.0–0.1)
Basophils Relative: 0.6 % (ref 0.0–3.0)
Eosinophils Absolute: 0.2 10*3/uL (ref 0.0–0.7)
Eosinophils Relative: 2.6 % (ref 0.0–5.0)
HCT: 44.3 % (ref 39.0–52.0)
Hemoglobin: 15.5 g/dL (ref 13.0–17.0)
Lymphocytes Relative: 31.5 % (ref 12.0–46.0)
Lymphs Abs: 2.7 10*3/uL (ref 0.7–4.0)
MCHC: 35.1 g/dL (ref 30.0–36.0)
MCV: 93.4 fl (ref 78.0–100.0)
Monocytes Absolute: 0.6 10*3/uL (ref 0.1–1.0)
Monocytes Relative: 6.6 % (ref 3.0–12.0)
Neutro Abs: 4.9 10*3/uL (ref 1.4–7.7)
Neutrophils Relative %: 58.7 % (ref 43.0–77.0)
Platelets: 146 10*3/uL — ABNORMAL LOW (ref 150.0–400.0)
RBC: 4.74 Mil/uL (ref 4.22–5.81)
RDW: 14.5 % (ref 11.5–15.5)
WBC: 8.4 10*3/uL (ref 4.0–10.5)

## 2020-08-03 LAB — URINALYSIS, ROUTINE W REFLEX MICROSCOPIC
Bilirubin Urine: NEGATIVE
Hgb urine dipstick: NEGATIVE
Ketones, ur: NEGATIVE
Leukocytes,Ua: NEGATIVE
Nitrite: NEGATIVE
Specific Gravity, Urine: 1.025 (ref 1.000–1.030)
Total Protein, Urine: 100 — AB
Urine Glucose: >=1000 — AB
Urobilinogen, UA: 0.2 (ref 0.0–1.0)
pH: 6 (ref 5.0–8.0)

## 2020-08-03 LAB — HEMOGLOBIN A1C: Hgb A1c MFr Bld: 6.5 % (ref 4.6–6.5)

## 2020-08-03 LAB — LIPID PANEL
Cholesterol: 124 mg/dL (ref 0–200)
HDL: 41.2 mg/dL (ref >39.00)
LDL Cholesterol: 57 mg/dL (ref 0–99)
NonHDL: 82.72
Total CHOL/HDL Ratio: 3
Triglycerides: 128 mg/dL (ref 0.0–149.0)
VLDL: 25.6 mg/dL (ref 0.0–40.0)

## 2020-08-03 LAB — HEPATIC FUNCTION PANEL
ALT: 27 U/L (ref 0–53)
AST: 20 U/L (ref 0–37)
Albumin: 4.7 g/dL (ref 3.5–5.2)
Alkaline Phosphatase: 73 U/L (ref 39–117)
Bilirubin, Direct: 0.2 mg/dL (ref 0.0–0.3)
Total Bilirubin: 1.1 mg/dL (ref 0.2–1.2)
Total Protein: 7.7 g/dL (ref 6.0–8.3)

## 2020-08-03 LAB — VITAMIN D 25 HYDROXY (VIT D DEFICIENCY, FRACTURES): VITD: 43.63 ng/mL (ref 30.00–100.00)

## 2020-08-03 LAB — VITAMIN B12: Vitamin B-12: 284 pg/mL (ref 211–911)

## 2020-08-03 LAB — TSH: TSH: 2.03 u[IU]/mL (ref 0.35–4.50)

## 2020-08-03 MED ORDER — GLIPIZIDE ER 10 MG PO TB24: 10.0000 mg | ORAL_TABLET | Freq: Every day | ORAL | 3 refills | Status: DC

## 2020-08-03 MED ORDER — ATORVASTATIN CALCIUM 80 MG PO TABS: 80.0000 mg | ORAL_TABLET | Freq: Every day | ORAL | 3 refills | Status: DC

## 2020-08-03 MED ORDER — GABAPENTIN 300 MG PO CAPS: 300.0000 mg | ORAL_CAPSULE | Freq: Three times a day (TID) | ORAL | 5 refills | Status: DC

## 2020-08-03 MED ORDER — LOSARTAN POTASSIUM 25 MG PO TABS: ORAL_TABLET | ORAL | 3 refills | Status: DC

## 2020-08-03 MED ORDER — CARVEDILOL 12.5 MG PO TABS: ORAL_TABLET | ORAL | 3 refills | Status: DC

## 2020-08-03 MED ORDER — EMPAGLIFLOZIN 25 MG PO TABS: 25.0000 mg | ORAL_TABLET | Freq: Every day | ORAL | 3 refills | Status: DC

## 2020-08-03 MED ORDER — HYDROCHLOROTHIAZIDE 25 MG PO TABS: ORAL_TABLET | ORAL | 3 refills | Status: DC

## 2020-08-03 MED ORDER — HYDROCODONE-ACETAMINOPHEN 7.5-325 MG PO TABS: 1.0000 | ORAL_TABLET | Freq: Every evening | ORAL | 0 refills | Status: DC | PRN

## 2020-08-03 MED ORDER — METFORMIN HCL 1000 MG PO TABS: 1000.0000 mg | ORAL_TABLET | Freq: Two times a day (BID) | ORAL | 3 refills | Status: DC

## 2020-08-03 NOTE — Assessment & Plan Note (Signed)
Lab Results  Component Value Date   LDLCALC 57 08/03/2020   Stable, pt to continue current statin lipitor 80

## 2020-08-03 NOTE — Assessment & Plan Note (Signed)
BP Readings from Last 3 Encounters:  08/03/20 112/70  05/27/20 122/80  02/17/20 96/66   Stable, pt to continue medical treatment coreg, hct, losartan

## 2020-08-03 NOTE — Progress Notes (Signed)
Patient ID: [name], male   DOB: 1934/08/31, 86 y.o.   MRN: [id number]         Chief Complaint:: wellness exam and Follow-up  PHN , DM, htn, hld       HPI:  [name] is a 86 y.o. male here for wellness exam; has eye exam sched'd next month, has some left eye blurriness so thinks it may be the macular degeneration worsening.  Decliens covid booster #2, o/w up to date with preventive referrals and immunizations                        Also has ongoing persistent PHN neuritic pain to the right flank/side RUQ , moderate, constant, for over 6 months, worse at night, miserable tyring to get to sleep every night besides current med tx, nothing else makes better or worse.  Pt denies chest pain, increased sob or doe, wheezing, orthopnea, PND, increased LE swelling, palpitations, dizziness or syncope.   Pt denies polydipsia, polyuria, or new focal neuro s/s.   Pt denies fever, wt loss, night sweats, loss of appetite, or other constitutional symptoms  No other new complaints   Wt down with better diet. Wt Readings from Last 3 Encounters:  08/03/20 246 lb (111.6 kg)  05/27/20 247 lb 6.4 oz (112.2 kg)  02/17/20 252 lb (114.3 kg)   BP Readings from Last 3 Encounters:  08/03/20 112/70  05/27/20 122/80  02/17/20 96/66   Immunization History  Administered Date(s) Administered   Fluad Quad(high Dose 65+) 12/14/2018   Influenza Whole 12/08/2008   Influenza, High Dose Seasonal PF 11/02/2017, 11/15/2019   Influenza,inj,Quad PF,6+ Mos 10/23/2013, 10/24/2014   PFIZER(Purple Top)SARS-COV-2 Vaccination 04/08/2019, 05/09/2019, 12/11/2019   Pneumococcal Conjugate-13 04/19/2013   Pneumococcal Polysaccharide-23 08/22/2008   Td 02/07/1993, 08/22/2008   Tdap 09/12/2018   Zoster Recombinat (Shingrix) 05/24/2017, 09/26/2017   There are no preventive care reminders to display for this patient.     Past Medical History:  Diagnosis Date   AAA (abdominal aortic aneurysm) ([place])    MONITORED BY DR  LAWSON LOV JUNE 2014--  STABLE  AT 3.7   Abdominal aneurysm without mention of rupture 07/24/2012   Acute sinus infection 06/17/2009   Qualifier: Diagnosis of  By: Jenny Reichmann MD, Hunt Oris    Allergic rhinitis    Anxiety state 07/20/2007   Arthritis    Asymptomatic stenosis of left carotid artery without infarction    MILD DISEASE   Bilateral carotid artery stenosis    BPH (benign prostatic hypertrophy)    Coronary artery calcification seen on CT scan 09/05/2017   Diabetes mellitus type 2, noninsulin dependent ([place]) 07/20/2007   Dilated cardiomyopathy ([place]) 12/14/2018   Diverticulosis of colon    Hearing loss of both ears 10/23/2013   History of cardiomyopathy    IDIOPATHIC-- PER [facility] NOTE 2013 , CURRENTLY RESOLVED   History of kidney stones    HTN (hypertension) 01/14/2016   Hypercalcemia 03/15/2019   Hyperlipidemia    Hypertension    Hypertensive heart disease without CHF    Obesity (BMI 30-39.9)    Paresthesia of both feet 07/14/2016   Peripheral vascular disease ([place])    Personal history of kidney stones    Phimosis    PHN (postherpetic neuralgia) 09/18/2019   Preop exam for internal medicine 05/10/2016   Preventative health care 01/14/2016   Rash 05/01/2015   Right carotid artery occlusion    CHRONIC   WITHOUT INFARTION  Rotator cuff arthropathy of both shoulders 04/10/2019   Spinal stenosis at L4-L5 level 06/08/2016   Stroke ([place])     Right eye   Type 2 diabetes mellitus ([place])    Wears hearing aid    Past Surgical History:  Procedure Laterality Date   CARDIOVASCULAR STRESS TEST  JUNE 2008  DR Wynonia Lawman   CATARACT EXTRACTION W/ INTRAOCULAR LENS IMPLANT Right    CIRCUMCISION N/A 08/30/2012   Procedure: CIRCUMCISION ADULT;  Surgeon: Malka So, MD;  Location: [facility];  Service: Urology;  Laterality: N/A;   CYSTO/  RIGHT URETERAL STENT PLACEMENT  10-16-2003   EXTRACORPOREAL SHOCK WAVE LITHOTRIPSY Right 2005   EYE SURGERY     JOINT REPLACEMENT Bilateral 2008  2009   Knee    LUMBAR LAMINECTOMY/DECOMPRESSION MICRODISCECTOMY Bilateral 06/08/2016   Procedure: Bilateral Microlumbar decompression L4-L5 and L5-S1;  Surgeon: Susa Day, MD;  Location: [facility];  Service: Orthopedics;  Laterality: Bilateral;  Requests 2 hours   TOTAL KNEE ARTHROPLASTY Bilateral RIGHT 10-08-2007;   LEFT 09-30-2008   TRANSTHORACIC ECHOCARDIOGRAM  10-21-2009   LVEF 50%    reports that he quit smoking about 38 years ago. His smoking use included cigarettes. He has a 18.75 pack-year smoking history. He has never used smokeless tobacco. He reports that he does not drink alcohol and does not use drugs. family history includes ALS in his father; Asthma in his mother; COPD in his mother; Depression in his mother; Diabetes in his father; Heart disease in his mother; Hypertension in his mother; Kidney disease in his mother. No Known Allergies Current Outpatient Medications on File Prior to Visit  Medication Sig Dispense Refill   beta carotene w/minerals (OCUVITE) tablet Take 1 tablet by mouth daily.     clobetasol ointment (TEMOVATE) 0.05 % APPLY TOPICALLY TWICE DAILY 30 g 1   dexamethasone (DECADRON) 4 MG tablet Take 8 mg by mouth daily.     ketoconazole (NIZORAL) 2 % cream Apply topically 2 (two) times daily as needed for irritation. 30 g 1   Multiple Vitamin (MULTIVITAMIN WITH MINERALS) TABS tablet Take 1 tablet by mouth daily.     sildenafil (VIAGRA) 100 MG tablet Take 0.5-1 tablets (50-100 mg total) by mouth daily as needed for erectile dysfunction. 5 tablet 11   IBU 800 MG tablet Take 800 mg by mouth every 6 (six) hours as needed.     No current facility-administered medications on file prior to visit.        ROS:  All others reviewed and negative.  Objective        PE:  BP 112/70 (BP Location: Left Arm, Patient Position: Sitting, Cuff Size: Large)   Pulse (!) 54   Temp 98.1 F (36.7 C) (Oral)   Ht 5\' 11"  (1.803 m)   Wt 246 lb (111.6 kg)   SpO2 98%   BMI 34.31 kg/m                  Constitutional: Pt appears in NAD               HENT: Head: NCAT.                Right Ear: External ear normal.                 Left Ear: External ear normal.                Eyes: . Pupils are equal, round, and reactive to light. Conjunctivae  and EOM are normal               Nose: without d/c or deformity               Neck: Neck supple. Gross normal ROM               Cardiovascular: Normal rate and regular rhythm.                 Pulmonary/Chest: Effort normal and breath sounds without rales or wheezing.                Abd:  Soft, NT, ND, + BS, no organomegaly               Neurological: Pt is alert. At baseline orientation, motor grossly intact               Skin: Skin is warm. No rashes, no other new lesions, LE edema - none               Psychiatric: Pt behavior is normal without agitation   Micro: none  Cardiac tracings I have personally interpreted today:  none  Pertinent Radiological findings (summarize): none   Lab Results  Component Value Date   WBC 8.4 08/03/2020   HGB 15.5 08/03/2020   HCT 44.3 08/03/2020   PLT 146.0 (L) 08/03/2020   GLUCOSE 105 (H) 08/03/2020   CHOL 124 08/03/2020   TRIG 128.0 08/03/2020   HDL 41.20 08/03/2020   LDLDIRECT 92.0 02/06/2017   LDLCALC 57 08/03/2020   ALT 27 08/03/2020   AST 20 08/03/2020   NA 139 08/03/2020   K 3.8 08/03/2020   CL 100 08/03/2020   CREATININE 0.97 08/03/2020   BUN 19 08/03/2020   CO2 26 08/03/2020   TSH 2.03 08/03/2020   PSA 2.15 07/14/2016   INR 1.1 09/24/2008   HGBA1C 6.5 08/03/2020   MICROALBUR 3.2 (H) 08/03/2020   Assessment/Plan:  [name] is a 86 y.o. White or Caucasian [1] male with  has a past medical history of AAA (abdominal aortic aneurysm) ([place]), Abdominal aneurysm without mention of rupture (07/24/2012), Acute sinus infection (06/17/2009), Allergic rhinitis, Anxiety state (07/20/2007), Arthritis, Asymptomatic stenosis of left carotid artery without infarction, Bilateral carotid artery  stenosis, BPH (benign prostatic hypertrophy), Coronary artery calcification seen on CT scan (09/05/2017), Diabetes mellitus type 2, noninsulin dependent ([place]) (07/20/2007), Dilated cardiomyopathy ([place]) (12/14/2018), Diverticulosis of colon, Hearing loss of both ears (10/23/2013), History of cardiomyopathy, History of kidney stones, HTN (hypertension) (01/14/2016), Hypercalcemia (03/15/2019), Hyperlipidemia, Hypertension, Hypertensive heart disease without CHF, Obesity (BMI 30-39.9), Paresthesia of both feet (07/14/2016), Peripheral vascular disease ([place]), Personal history of kidney stones, Phimosis, PHN (postherpetic neuralgia) (09/18/2019), Preop exam for internal medicine (05/10/2016), Preventative health care (01/14/2016), Rash (05/01/2015), Right carotid artery occlusion, Rotator cuff arthropathy of both shoulders (04/10/2019), Spinal stenosis at L4-L5 level (06/08/2016), Stroke ([place]), Type 2 diabetes mellitus ([place]), and Wears hearing aid.  Encounter for well adult exam with abnormal findings Age and sex appropriate education and counseling updated with regular exercise and diet Referrals for preventative services - ot to make eye doctor appt Immunizations addressed - declines covid booster Smoking counseling  - none needed Evidence for depression or other mood disorder - none significant Most recent labs reviewed. I have personally reviewed and have noted: 1) the patient's medical and social history 2) The patient's current medications and supplements 3) The patient's height, weight, and BMI have been recorded in the chart  PHN (postherpetic neuralgia) Chronic persistent, not well controlled, for pain clinic referral for possible nerve ablation  Diabetes mellitus type 2, noninsulin dependent Lab Results  Component Value Date   HGBA1C 6.5 08/03/2020   Stable, pt to continue current medical treatment jardiance, glucotrol   HTN (hypertension) BP Readings from Last 3 Encounters:  08/03/20 112/70   05/27/20 122/80  02/17/20 96/66   Stable, pt to continue medical treatment coreg, hct, losartan   Hyperlipidemia Lab Results  Component Value Date   LDLCALC 57 08/03/2020   Stable, pt to continue current statin lipitor 80  Followup: Return in about 6 months (around 02/02/2021).  Cathlean Cower, MD 08/03/2020 10:28 PM [facility]

## 2020-08-03 NOTE — Assessment & Plan Note (Signed)
Lab Results  Component Value Date   HGBA1C 6.5 08/03/2020   Stable, pt to continue current medical treatment jardiance, glucotrol

## 2020-08-03 NOTE — Assessment & Plan Note (Signed)
Age and sex appropriate education and counseling updated with regular exercise and diet Referrals for preventative services - ot to make eye doctor appt Immunizations addressed - declines covid booster Smoking counseling  - none needed Evidence for depression or other mood disorder - none significant Most recent labs reviewed. I have personally reviewed and have noted: 1) the patient's medical and social history 2) The patient's current medications and supplements 3) The patient's height, weight, and BMI have been recorded in the chart

## 2020-08-03 NOTE — Assessment & Plan Note (Signed)
Chronic persistent, not well controlled, for pain clinic referral for possible nerve ablation

## 2020-08-03 NOTE — Patient Instructions (Signed)
Please continue all other medications as before, and refills have been done if requested.  Please have the pharmacy call with any other refills you may need.  Please continue your efforts at being more active, low cholesterol diet, and weight control.  You are otherwise up to date with prevention measures today.  Please keep your appointments with your specialists as you may have planned  You will be contacted regarding the referral for: Pain management for the right sided nerve pain, for possible nerve block procedure  Please go to the LAB at the blood drawing area for the tests to be done today  You will be contacted by phone if any changes need to be made immediately.  Otherwise, you will receive a letter about your results with an explanation, but please check with MyChart first.  Please remember to sign up for MyChart if you have not done so, as this will be important to you in the future with finding out test results, communicating by private email, and scheduling acute appointments online when needed.  Please make an Appointment to return in 6 months (after Feb 07 2021), or sooner if needed, also with Lab Appointment for testing done 3-5 days before at the [facility] (so this is for TWO appointments - please see the scheduling desk as you leave)  Due to the ongoing Covid 19 pandemic, our lab now requires an appointment for any labs done at our office.  If you need labs done and do not have an appointment, please call our office ahead of time to schedule before presenting to the lab for your testing.

## 2020-08-14 ENCOUNTER — Encounter: Payer: Self-pay | Admitting: Physical Medicine & Rehabilitation

## 2020-08-24 ENCOUNTER — Encounter: Payer: Self-pay | Admitting: Internal Medicine

## 2020-08-24 DIAGNOSIS — H353113 Nonexudative age-related macular degeneration, right eye, advanced atrophic without subfoveal involvement: Secondary | ICD-10-CM | POA: Diagnosis not present

## 2020-08-24 DIAGNOSIS — H35372 Puckering of macula, left eye: Secondary | ICD-10-CM | POA: Diagnosis not present

## 2020-08-24 DIAGNOSIS — H34211 Partial retinal artery occlusion, right eye: Secondary | ICD-10-CM | POA: Diagnosis not present

## 2020-08-24 DIAGNOSIS — H353122 Nonexudative age-related macular degeneration, left eye, intermediate dry stage: Secondary | ICD-10-CM | POA: Diagnosis not present

## 2020-08-24 LAB — HM DIABETES EYE EXAM

## 2020-10-22 ENCOUNTER — Encounter: Payer: Medicare HMO | Admitting: Physical Medicine & Rehabilitation

## 2020-11-11 ENCOUNTER — Other Ambulatory Visit: Payer: Self-pay

## 2020-11-11 ENCOUNTER — Ambulatory Visit: Payer: Medicare HMO | Admitting: Cardiology

## 2020-11-11 VITALS — BP 96/52 | HR 51 | Ht 71.0 in | Wt 231.0 lb

## 2020-11-11 DIAGNOSIS — I6521 Occlusion and stenosis of right carotid artery: Secondary | ICD-10-CM

## 2020-11-11 DIAGNOSIS — I7143 Infrarenal abdominal aortic aneurysm, without rupture: Secondary | ICD-10-CM | POA: Diagnosis not present

## 2020-11-11 DIAGNOSIS — I10 Essential (primary) hypertension: Secondary | ICD-10-CM

## 2020-11-11 DIAGNOSIS — I420 Dilated cardiomyopathy: Secondary | ICD-10-CM | POA: Diagnosis not present

## 2020-11-11 NOTE — Progress Notes (Signed)
Cardiology Office Note:    Date:  11/11/2020   ID:  EKAM [name], DOB [birth year], MRN [id number]  PCP:  Biagio Borg, MD  Cardiologist:  Jenne Campus, MD    Referring MD: Biagio Borg, MD   Chief Complaint  Patient presents with   Follow-up  I am doing well  History of Present Illness:    [name] is a 86 y.o. male with past medical history significant for abdominal aortic aneurysm measuring 3.6 to 3.7 cm, remote coronary artery disease, cardiomyopathy with baseline ejection fraction 40 to 45%, essential hypertension, diabetes.  He comes today to my office for follow-up.  Overall he seems to be doing well.  He denies have any chest pain tightness squeezing pressure burning chest no palpitations no dizziness.  He is hard of hearing, therefore conversation with him somewhat difficult.  Past Medical History:  Diagnosis Date   AAA (abdominal aortic aneurysm) ([place])    MONITORED BY DR LAWSON LOV JUNE 2014--  STABLE  AT 3.7   Abdominal aneurysm without mention of rupture 07/24/2012   Acute sinus infection 06/17/2009   Qualifier: Diagnosis of  By: Jenny Reichmann MD, [facility]    Allergic rhinitis    Anxiety state 07/20/2007   Arthritis    Asymptomatic stenosis of left carotid artery without infarction    MILD DISEASE   Bilateral carotid artery stenosis    BPH (benign prostatic hypertrophy)    Coronary artery calcification seen on CT scan 09/05/2017   Diabetes mellitus type 2, noninsulin dependent ([place]) 07/20/2007   Dilated cardiomyopathy ([place]) 12/14/2018   Diverticulosis of colon    Hearing loss of both ears 10/23/2013   History of cardiomyopathy    IDIOPATHIC-- PER [facility] NOTE 2013 , CURRENTLY RESOLVED   History of kidney stones    HTN (hypertension) 01/14/2016   Hypercalcemia 03/15/2019   Hyperlipidemia    Hypertension    Hypertensive heart disease without CHF    Obesity (BMI 30-39.9)    Paresthesia of both feet 07/14/2016   Peripheral vascular disease ([place])    Personal history  of kidney stones    Phimosis    PHN (postherpetic neuralgia) 09/18/2019   Preop exam for internal medicine 05/10/2016   Preventative health care 01/14/2016   Rash 05/01/2015   Right carotid artery occlusion    CHRONIC   WITHOUT INFARTION   Rotator cuff arthropathy of both shoulders 04/10/2019   Spinal stenosis at L4-L5 level 06/08/2016   Stroke ([place])     Right eye   Type 2 diabetes mellitus ([place])    Wears hearing aid     Past Surgical History:  Procedure Laterality Date   CARDIOVASCULAR STRESS TEST  JUNE 2008  DR Wynonia Lawman   CATARACT EXTRACTION W/ INTRAOCULAR LENS IMPLANT Right    CIRCUMCISION N/A 08/30/2012   Procedure: CIRCUMCISION ADULT;  Surgeon: Malka So, MD;  Location: [facility];  Service: Urology;  Laterality: N/A;   CYSTO/  RIGHT URETERAL STENT PLACEMENT  10-16-2003   EXTRACORPOREAL SHOCK WAVE LITHOTRIPSY Right 2005   EYE SURGERY     JOINT REPLACEMENT Bilateral 2008  2009   Knee   LUMBAR LAMINECTOMY/DECOMPRESSION MICRODISCECTOMY Bilateral 06/08/2016   Procedure: Bilateral Microlumbar decompression L4-L5 and L5-S1;  Surgeon: Susa Day, MD;  Location: [facility];  Service: Orthopedics;  Laterality: Bilateral;  Requests 2 hours   TOTAL KNEE ARTHROPLASTY Bilateral RIGHT 10-08-2007;   LEFT 09-30-2008   TRANSTHORACIC ECHOCARDIOGRAM  10-21-2009   LVEF 50%  Current Medications: Current Meds  Medication Sig   atorvastatin (LIPITOR) 80 MG tablet Take 1 tablet (80 mg total) by mouth daily.   beta carotene w/minerals (OCUVITE) tablet Take 1 tablet by mouth daily. Unknown strength   carvedilol (COREG) 12.5 MG tablet 1 tab by mouth twice per day (Patient taking differently: Take 12.5 mg by mouth 2 (two) times daily with a meal. 1 tab by mouth twice per day)   chlorhexidine (PERIDEX) 0.12 % solution Use as directed 15 mLs in the mouth or throat 2 (two) times daily.   clobetasol ointment (TEMOVATE) 0.05 % APPLY TOPICALLY TWICE DAILY (Patient taking differently: Apply 1  application topically 2 (two) times daily.)   dexamethasone (DECADRON) 4 MG tablet Take 8 mg by mouth daily.   empagliflozin (JARDIANCE) 25 MG TABS tablet Take 1 tablet (25 mg total) by mouth daily.   gabapentin (NEURONTIN) 300 MG capsule Take 1 capsule (300 mg total) by mouth 3 (three) times daily.   glipiZIDE (GLUCOTROL XL) 10 MG 24 hr tablet Take 1 tablet (10 mg total) by mouth daily with breakfast.   hydrochlorothiazide (HYDRODIURIL) 25 MG tablet 1 tab by mouth once daily (Patient taking differently: Take 25 mg by mouth daily. 1 tab by mouth once daily)   HYDROcodone-acetaminophen (NORCO) 7.5-325 MG tablet Take 1 tablet by mouth at bedtime as needed for moderate pain.   IBU 800 MG tablet Take 800 mg by mouth every 6 (six) hours as needed for mild pain or moderate pain.   ketoconazole (NIZORAL) 2 % cream Apply topically 2 (two) times daily as needed for irritation. (Patient taking differently: Apply 1 application topically 2 (two) times daily as needed for irritation.)   losartan (COZAAR) 25 MG tablet TAKE ONE (1) TABLET BY MOUTH TWO (2) TIMES DAILY (Patient taking differently: Take 25 mg by mouth 2 (two) times daily. TAKE ONE (1) TABLET BY MOUTH TWO (2) TIMES DAILY)   metFORMIN (GLUCOPHAGE) 1000 MG tablet Take 1 tablet (1,000 mg total) by mouth 2 (two) times daily.   Multiple Vitamin (MULTIVITAMIN WITH MINERALS) TABS tablet Take 1 tablet by mouth daily. Unknown strength   sildenafil (VIAGRA) 100 MG tablet Take 0.5-1 tablets (50-100 mg total) by mouth daily as needed for erectile dysfunction.     Allergies:   Patient has no known allergies.   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  Tobacco Use   Smoking  status: Former    Packs/day: 0.75    Years: 25.00    Pack years: 18.75    Types: Cigarettes    Quit date: 07/25/1982    Years since quitting: 38.3   Smokeless tobacco: Never  Vaping Use   Vaping Use: Never used  Substance and Sexual Activity   Alcohol use: No    Alcohol/week: 0.0 standard drinks    Comment: recovering alcoholic  [id number]   Drug use: No   Sexual activity: Not on file  Other Topics Concern   Not on file  Social History Narrative   Not on file   Social Determinants of Health   Financial Resource Strain: Low Risk    Difficulty of Paying Living Expenses:  Not hard at all  Food Insecurity: No Food Insecurity   Worried About Charity fundraiser in the Last Year: Never true   Ran Out of Food in the Last Year: Never true  Transportation Needs: No Transportation Needs   Lack of Transportation (Medical): No   Lack of Transportation (Non-Medical): No  Physical Activity: Insufficiently Active   Days of Exercise per Week: 3 days   Minutes of Exercise per Session: 30 min  Stress: No Stress Concern Present   Feeling of Stress : Not at all  Social Connections: Unknown   Frequency of Communication with Friends and Family: More than three times a week   Frequency of Social Gatherings with Friends and Family: More than three times a week   Attends Religious Services: 1 to 4 times per year   Active Member of [employer] or Organizations: No   Attends Music therapist: 1 to 4 times per year   Marital Status: Not on file     Family History: The patient's family history includes ALS in his father; Asthma in his mother; COPD in his mother; Depression in his mother; Diabetes in his father; Heart disease in his mother; Hypertension in his mother; Kidney disease in his mother. There is no history of Colon cancer. ROS:   Please see the history of present illness.    All 14 point review of systems negative except as described per history of present illness  EKGs/Labs/Other  Studies Reviewed:      Recent Labs: 08/03/2020: ALT 27; BUN 19; Creatinine, Ser 0.97; Hemoglobin 15.5; Platelets 146.0; Potassium 3.8; Sodium 139; TSH 2.03  Recent Lipid Panel    Component Value Date/Time   CHOL 124 08/03/2020 1443   CHOL 124 03/11/2020 1108   TRIG 128.0 08/03/2020 1443   HDL 41.20 08/03/2020 1443   HDL 41 03/11/2020 1108   CHOLHDL 3 08/03/2020 1443   VLDL 25.6 08/03/2020 1443   LDLCALC 57 08/03/2020 1443   LDLCALC 67 03/11/2020 1108   LDLDIRECT 92.0 02/06/2017 1119    Physical Exam:    VS:  BP (!) 96/52 (BP Location: Right Arm, Patient Position: Sitting)   Pulse (!) 51   Ht 5\' 11"  (1.803 m)   Wt 231 lb (104.8 kg)   SpO2 96%   BMI 32.22 kg/m     Wt Readings from Last 3 Encounters:  11/11/20 231 lb (104.8 kg)  08/03/20 246 lb (111.6 kg)  05/27/20 247 lb 6.4 oz (112.2 kg)     GEN:  Well nourished, well developed in no acute distress HEENT: Normal NECK: No JVD; No carotid bruits LYMPHATICS: No lymphadenopathy CARDIAC: RRR, no murmurs, no rubs, no gallops RESPIRATORY:  Clear to auscultation without rales, wheezing or rhonchi  ABDOMEN: Soft, non-tender, non-distended MUSCULOSKELETAL:  No edema; No deformity  SKIN: Warm and dry LOWER EXTREMITIES: no swelling NEUROLOGIC:  Alert and oriented x 3 PSYCHIATRIC:  Normal affect   ASSESSMENT:    1. Infrarenal abdominal aortic aneurysm (AAA) without rupture   2. Primary hypertension   3. Dilated cardiomyopathy ([place])   4. Right carotid artery occlusion    PLAN:    In order of problems listed above:  History of cardiomyopathy somewhat difficult to control because of low blood pressure.  I will schedule him to have another echocardiogram to rule so again reassess left ventricular ejection fraction in the meantime we will continue limited medication that he can tolerate he is on carvedilol as well as small dose of diuretic.  He is also on losartan in the future we may consider switching to [facility] we will  wait for results of the test. Abdominal aortic aneurysm we will schedule him to have abdominal ultrasound Essential hypertension blood pressure seems to be well controlled continue present management Dyslipidemia I did review his K PN which show LDL 57 HDL 41 it is an excellent cholesterol profile he is on atorvastatin 80 which I will continue   Medication Adjustments/Labs and Tests Ordered: Current medicines are reviewed at length with the patient today.  Concerns regarding medicines are outlined above.  No orders of the defined types were placed in this encounter.  Medication changes: No orders of the defined types were placed in this encounter.   Signed, Park Liter, MD, [facility] 11/11/2020 1:40 PM    [facility]

## 2020-11-11 NOTE — Patient Instructions (Signed)
Medication Instructions:  Your physician recommends that you continue on your current medications as directed. Please refer to the Current Medication list given to you today.  *If you need a refill on your cardiac medications before your next appointment, please call your pharmacy*   Lab Work: None If you have labs (blood work) drawn today and your tests are completely normal, you will receive your results only by: [facility] (if you have MyChart) OR A paper copy in the mail If you have any lab test that is abnormal or we need to change your treatment, we will call you to review the results.   Testing/Procedures: Your physician has requested that you have an echocardiogram. Echocardiography is a painless test that uses sound waves to create images of your heart. It provides your doctor with information about the size and shape of your heart and how well your heart's chambers and valves are working. This procedure takes approximately one hour. There are no restrictions for this procedure.  Your physician has requested that you have an abdominal aorta duplex. During this test, an ultrasound is used to evaluate the aorta. Allow 30 minutes for this exam. Do not eat after midnight the day before and avoid carbonated beverages    Follow-Up: At [facility], you and your health needs are our priority.  As part of our continuing mission to provide you with exceptional heart care, we have created designated Provider Care Teams.  These Care Teams include your primary Cardiologist (physician) and Advanced Practice Providers (APPs -  Physician Assistants and Nurse Practitioners) who all work together to provide you with the care you need, when you need it.  We recommend signing up for the patient portal called "MyChart".  Sign up information is provided on this After Visit Summary.  MyChart is used to connect with patients for Virtual Visits (Telemedicine).  Patients are able to view lab/test  results, encounter notes, upcoming appointments, etc.  Non-urgent messages can be sent to your provider as well.   To learn more about what you can do with MyChart, go to [email or web address].    Your next appointment:   2 month(s)  The format for your next appointment:   In Person  Provider:   Jenne Campus, MD   Other Instructions

## 2020-12-02 ENCOUNTER — Other Ambulatory Visit: Payer: Self-pay | Admitting: Cardiology

## 2020-12-02 DIAGNOSIS — I7143 Infrarenal abdominal aortic aneurysm, without rupture: Secondary | ICD-10-CM

## 2020-12-02 DIAGNOSIS — I420 Dilated cardiomyopathy: Secondary | ICD-10-CM

## 2020-12-09 ENCOUNTER — Ambulatory Visit (HOSPITAL_BASED_OUTPATIENT_CLINIC_OR_DEPARTMENT_OTHER): Admission: RE | Admit: 2020-12-09 | Payer: Medicare HMO | Source: Ambulatory Visit

## 2020-12-14 ENCOUNTER — Other Ambulatory Visit: Payer: Self-pay

## 2020-12-14 ENCOUNTER — Ambulatory Visit (HOSPITAL_BASED_OUTPATIENT_CLINIC_OR_DEPARTMENT_OTHER)
Admission: RE | Admit: 2020-12-14 | Discharge: 2020-12-14 | Disposition: A | Discharge status: Home / Self Care | Payer: Medicare HMO | Source: Ambulatory Visit | Attending: Cardiology | Admitting: Cardiology

## 2020-12-14 DIAGNOSIS — I7143 Infrarenal abdominal aortic aneurysm, without rupture: Secondary | ICD-10-CM

## 2020-12-14 DIAGNOSIS — I420 Dilated cardiomyopathy: Secondary | ICD-10-CM | POA: Diagnosis not present

## 2020-12-14 LAB — ECHOCARDIOGRAM COMPLETE
AR max vel: 1.6 cm2
AV Area VTI: 1.53 cm2
AV Area mean vel: 1.31 cm2
AV Mean grad: 4 mmHg
AV Peak grad: 7.5 mmHg
Ao pk vel: 1.37 m/s
Area-P 1/2: 2.39 cm2
Calc EF: 40.9 %
S' Lateral: 4.3 cm
Single Plane A2C EF: 39.8 %
Single Plane A4C EF: 40.5 %

## 2021-01-13 ENCOUNTER — Ambulatory Visit (HOSPITAL_COMMUNITY): Payer: Medicare HMO

## 2021-01-15 ENCOUNTER — Ambulatory Visit (HOSPITAL_COMMUNITY): Payer: Medicare HMO

## 2021-01-19 DIAGNOSIS — H903 Sensorineural hearing loss, bilateral: Secondary | ICD-10-CM | POA: Diagnosis not present

## 2021-02-02 ENCOUNTER — Other Ambulatory Visit (INDEPENDENT_AMBULATORY_CARE_PROVIDER_SITE_OTHER): Payer: Medicare HMO

## 2021-02-02 ENCOUNTER — Other Ambulatory Visit: Payer: Self-pay

## 2021-02-02 DIAGNOSIS — E559 Vitamin D deficiency, unspecified: Secondary | ICD-10-CM

## 2021-02-02 DIAGNOSIS — E119 Type 2 diabetes mellitus without complications: Secondary | ICD-10-CM | POA: Diagnosis not present

## 2021-02-02 LAB — LIPID PANEL
Cholesterol: 129 mg/dL (ref 0–200)
HDL: 39.7 mg/dL (ref >39.00)
LDL Cholesterol: 68 mg/dL (ref 0–99)
NonHDL: 89.63
Total CHOL/HDL Ratio: 3
Triglycerides: 107 mg/dL (ref 0.0–149.0)
VLDL: 21.4 mg/dL (ref 0.0–40.0)

## 2021-02-02 LAB — URINALYSIS, ROUTINE W REFLEX MICROSCOPIC
Bilirubin Urine: NEGATIVE
Hgb urine dipstick: NEGATIVE
Ketones, ur: NEGATIVE
Nitrite: NEGATIVE
RBC / HPF: NONE SEEN (ref 0–?)
Specific Gravity, Urine: 1.025 (ref 1.000–1.030)
Total Protein, Urine: NEGATIVE
Urine Glucose: 500 — AB
Urobilinogen, UA: 0.2 (ref 0.0–1.0)
pH: 6 (ref 5.0–8.0)

## 2021-02-02 LAB — CBC WITH DIFFERENTIAL/PLATELET
Basophils Absolute: 0 10*3/uL (ref 0.0–0.1)
Basophils Relative: 0.5 % (ref 0.0–3.0)
Eosinophils Absolute: 0.2 10*3/uL (ref 0.0–0.7)
Eosinophils Relative: 2.3 % (ref 0.0–5.0)
HCT: 42.8 % (ref 39.0–52.0)
Hemoglobin: 14.6 g/dL (ref 13.0–17.0)
Lymphocytes Relative: 22 % (ref 12.0–46.0)
Lymphs Abs: 2.1 10*3/uL (ref 0.7–4.0)
MCHC: 34.1 g/dL (ref 30.0–36.0)
MCV: 95.1 fl (ref 78.0–100.0)
Monocytes Absolute: 0.6 10*3/uL (ref 0.1–1.0)
Monocytes Relative: 5.9 % (ref 3.0–12.0)
Neutro Abs: 6.6 10*3/uL (ref 1.4–7.7)
Neutrophils Relative %: 69.3 % (ref 43.0–77.0)
Platelets: 123 10*3/uL — ABNORMAL LOW (ref 150.0–400.0)
RBC: 4.5 Mil/uL (ref 4.22–5.81)
RDW: 13.8 % (ref 11.5–15.5)
WBC: 9.6 10*3/uL (ref 4.0–10.5)

## 2021-02-02 LAB — HEPATIC FUNCTION PANEL
ALT: 18 U/L (ref 0–53)
AST: 17 U/L (ref 0–37)
Albumin: 4.2 g/dL (ref 3.5–5.2)
Alkaline Phosphatase: 57 U/L (ref 39–117)
Bilirubin, Direct: 0.2 mg/dL (ref 0.0–0.3)
Total Bilirubin: 1 mg/dL (ref 0.2–1.2)
Total Protein: 7.1 g/dL (ref 6.0–8.3)

## 2021-02-02 LAB — MICROALBUMIN / CREATININE URINE RATIO
Creatinine,U: 107 mg/dL
Microalb Creat Ratio: 0.7 mg/g (ref 0.0–30.0)
Microalb, Ur: 0.8 mg/dL (ref 0.0–1.9)

## 2021-02-02 LAB — BASIC METABOLIC PANEL
BUN: 22 mg/dL (ref 6–23)
CO2: 27 mEq/L (ref 19–32)
Calcium: 9.4 mg/dL (ref 8.4–10.5)
Chloride: 101 mEq/L (ref 96–112)
Creatinine, Ser: 0.95 mg/dL (ref 0.40–1.50)
GFR: 73.74 mL/min (ref >60.00)
Glucose, Bld: 81 mg/dL (ref 70–99)
Potassium: 3.7 mEq/L (ref 3.5–5.1)
Sodium: 139 mEq/L (ref 135–145)

## 2021-02-02 LAB — TSH: TSH: 3.12 u[IU]/mL (ref 0.35–5.50)

## 2021-02-02 LAB — VITAMIN D 25 HYDROXY (VIT D DEFICIENCY, FRACTURES): VITD: 34.53 ng/mL (ref 30.00–100.00)

## 2021-02-02 LAB — HEMOGLOBIN A1C: Hgb A1c MFr Bld: 6.3 % (ref 4.6–6.5)

## 2021-02-05 ENCOUNTER — Ambulatory Visit: Payer: Medicare HMO | Admitting: Internal Medicine

## 2021-02-15 ENCOUNTER — Encounter: Payer: Self-pay | Admitting: Internal Medicine

## 2021-02-15 ENCOUNTER — Ambulatory Visit (INDEPENDENT_AMBULATORY_CARE_PROVIDER_SITE_OTHER): Payer: Medicare HMO | Admitting: Internal Medicine

## 2021-02-15 ENCOUNTER — Other Ambulatory Visit: Payer: Self-pay

## 2021-02-15 VITALS — BP 100/76 | HR 55 | Temp 98.1°F | Ht 71.0 in | Wt 235.0 lb

## 2021-02-15 DIAGNOSIS — E559 Vitamin D deficiency, unspecified: Secondary | ICD-10-CM

## 2021-02-15 DIAGNOSIS — E782 Mixed hyperlipidemia: Secondary | ICD-10-CM

## 2021-02-15 DIAGNOSIS — E538 Deficiency of other specified B group vitamins: Secondary | ICD-10-CM

## 2021-02-15 DIAGNOSIS — Z0001 Encounter for general adult medical examination with abnormal findings: Secondary | ICD-10-CM | POA: Diagnosis not present

## 2021-02-15 DIAGNOSIS — E1165 Type 2 diabetes mellitus with hyperglycemia: Secondary | ICD-10-CM | POA: Diagnosis not present

## 2021-02-15 DIAGNOSIS — I10 Essential (primary) hypertension: Secondary | ICD-10-CM

## 2021-02-15 MED ORDER — METFORMIN HCL 1000 MG PO TABS: 500.0000 mg | ORAL_TABLET | Freq: Two times a day (BID) | ORAL | 3 refills | Status: DC

## 2021-02-15 NOTE — Progress Notes (Signed)
Patient ID: [name], male   DOB: 28-Jan-1935, 87 y.o.   MRN: [id number]         Chief Complaint:: wellness exam and Follow-up  Lower blood pressure, dm, hld       HPI:  [name] is a 87 y.o. male here for wellness exam; declines covid booster, o/w up to date.                        Also has lost significant wt recently with less appetite, and now BP at home has been low normal as today associated with some dizziness on standing and mild weakness, but Pt denies chest pain, increased sob or doe, wheezing, orthopnea, PND, increased LE swelling, palpitations, or syncope.   Pt denies polydipsia, polyuria, or new focal neuro s/s.   Pt denies fever, wt loss, night sweats, loss of appetite, or other constitutional symptoms  CBG's have been low normal as well.     Wt Readings from Last 3 Encounters:  02/15/21 235 lb (106.6 kg)  11/11/20 231 lb (104.8 kg)  08/03/20 246 lb (111.6 kg)   BP Readings from Last 3 Encounters:  02/15/21 100/76  11/11/20 (!) 96/52  08/03/20 112/70   Immunization History  Administered Date(s) Administered   Fluad Quad(high Dose 65+) 12/14/2018   Influenza Whole 12/08/2008   Influenza, High Dose Seasonal PF 11/02/2017, 11/15/2019   Influenza,inj,Quad PF,6+ Mos 10/23/2013, 10/24/2014   Influenza-Unspecified 01/19/2021   PFIZER(Purple Top)SARS-COV-2 Vaccination 04/08/2019, 05/09/2019, 12/11/2019, 11/02/2020   Pneumococcal Conjugate-13 04/19/2013   Pneumococcal Polysaccharide-23 08/22/2008   Td 02/07/1993, 08/22/2008   Tdap 09/12/2018   Zoster Recombinat (Shingrix) 05/24/2017, 09/26/2017   There are no preventive care reminders to display for this patient.     Past Medical History:  Diagnosis Date   AAA (abdominal aortic aneurysm)    MONITORED BY DR Kellie Simmering LOV JUNE 2014--  STABLE  AT 3.7   Abdominal aneurysm without mention of rupture 07/24/2012   Acute sinus infection 06/17/2009   Qualifier: Diagnosis of  By: Jenny Reichmann MD, [facility]is    Allergic rhinitis     Anxiety state 07/20/2007   Arthritis    Asymptomatic stenosis of left carotid artery without infarction    MILD DISEASE   Bilateral carotid artery stenosis    BPH (benign prostatic hypertrophy)    Coronary artery calcification seen on CT scan 09/05/2017   Diabetes mellitus type 2, noninsulin dependent ([place]) 07/20/2007   Dilated cardiomyopathy ([place]) 12/14/2018   Diverticulosis of colon    Hearing loss of both ears 10/23/2013   History of cardiomyopathy    IDIOPATHIC-- PER [facility] NOTE 2013 , CURRENTLY RESOLVED   History of kidney stones    HTN (hypertension) 01/14/2016   Hypercalcemia 03/15/2019   Hyperlipidemia    Hypertension    Hypertensive heart disease without CHF    Obesity (BMI 30-39.9)    Paresthesia of both feet 07/14/2016   Peripheral vascular disease ([place])    Personal history of kidney stones    Phimosis    PHN (postherpetic neuralgia) 09/18/2019   Preop exam for internal medicine 05/10/2016   Preventative health care 01/14/2016   Rash 05/01/2015   Right carotid artery occlusion    CHRONIC   WITHOUT INFARTION   Rotator cuff arthropathy of both shoulders 04/10/2019   Spinal stenosis at L4-L5 level 06/08/2016   Stroke ([place])     Right eye   Type 2 diabetes mellitus ([place])    Wears  hearing aid    Past Surgical History:  Procedure Laterality Date   CARDIOVASCULAR STRESS TEST  JUNE 2008  DR TILLEY   CATARACT EXTRACTION W/ INTRAOCULAR LENS IMPLANT Right    CIRCUMCISION N/A 08/30/2012   Procedure: CIRCUMCISION ADULT;  Surgeon: Malka So, MD;  [facility];  Service: Urology;  Laterality: N/A;   CYSTO/  RIGHT URETERAL STENT PLACEMENT  10-16-2003   EXTRACORPOREAL SHOCK WAVE LITHOTRIPSY Right 2005   EYE SURGERY     JOINT REPLACEMENT Bilateral 2008  2009   Knee   LUMBAR LAMINECTOMY/DECOMPRESSION MICRODISCECTOMY Bilateral 06/08/2016   Procedure: Bilateral Microlumbar decompression L4-L5 and L5-S1;  Surgeon: Susa Day, MD;  Location: [facility];  Service:  Orthopedics;  Laterality: Bilateral;  Requests 2 hours   TOTAL KNEE ARTHROPLASTY Bilateral RIGHT 10-08-2007;   LEFT 09-30-2008   TRANSTHORACIC ECHOCARDIOGRAM  10-21-2009   LVEF 50%    reports that he quit smoking about 38 years ago. His smoking use included cigarettes. He has a 18.75 pack-year smoking history. He has never used smokeless tobacco. He reports that he does not drink alcohol and does not use drugs. family history includes ALS in his father; Asthma in his mother; COPD in his mother; Depression in his mother; Diabetes in his father; Heart disease in his mother; Hypertension in his mother; Kidney disease in his mother. No Known Allergies Current Outpatient Medications on File Prior to Visit  Medication Sig Dispense Refill   atorvastatin (LIPITOR) 80 MG tablet Take 1 tablet (80 mg total) by mouth daily. 90 tablet 3   beta carotene w/minerals (OCUVITE) tablet Take 1 tablet by mouth daily. Unknown strength     carvedilol (COREG) 12.5 MG tablet 1 tab by mouth twice per day (Patient taking differently: Take 12.5 mg by mouth 2 (two) times daily with a meal. 1 tab by mouth twice per day) 90 tablet 3   chlorhexidine (PERIDEX) 0.12 % solution Use as directed 15 mLs in the mouth or throat 2 (two) times daily.     clobetasol ointment (TEMOVATE) 0.05 % APPLY TOPICALLY TWICE DAILY (Patient taking differently: Apply 1 application topically 2 (two) times daily.) 30 g 1   dexamethasone (DECADRON) 4 MG tablet Take 8 mg by mouth daily.     empagliflozin (JARDIANCE) 25 MG TABS tablet Take 1 tablet (25 mg total) by mouth daily. 90 tablet 3   gabapentin (NEURONTIN) 300 MG capsule Take 1 capsule (300 mg total) by mouth 3 (three) times daily. 90 capsule 5   glipiZIDE (GLUCOTROL XL) 10 MG 24 hr tablet Take 1 tablet (10 mg total) by mouth daily with breakfast. 90 tablet 3   HYDROcodone-acetaminophen (NORCO) 7.5-325 MG tablet Take 1 tablet by mouth at bedtime as needed for moderate pain. 30 tablet 0   IBU 800 MG  tablet Take 800 mg by mouth every 6 (six) hours as needed for mild pain or moderate pain.     ketoconazole (NIZORAL) 2 % cream Apply topically 2 (two) times daily as needed for irritation. (Patient taking differently: Apply 1 application topically 2 (two) times daily as needed for irritation.) 30 g 1   losartan (COZAAR) 25 MG tablet TAKE ONE (1) TABLET BY MOUTH TWO (2) TIMES DAILY (Patient taking differently: Take 25 mg by mouth 2 (two) times daily. TAKE ONE (1) TABLET BY MOUTH TWO (2) TIMES DAILY) 180 tablet 3   Multiple Vitamin (MULTIVITAMIN WITH MINERALS) TABS tablet Take 1 tablet by mouth daily. Unknown strength  sildenafil (VIAGRA) 100 MG tablet Take 0.5-1 tablets (50-100 mg total) by mouth daily as needed for erectile dysfunction. 5 tablet 11   No current facility-administered medications on file prior to visit.        ROS:  All others reviewed and negative.  Objective        PE:  BP 100/76 (BP Location: Right Arm, Patient Position: Sitting, Cuff Size: Large)    Pulse (!) 55    Temp 98.1 F (36.7 C) (Oral)    Ht 5\' 11"  (1.803 m)    Wt 235 lb (106.6 kg)    SpO2 100%    BMI 32.78 kg/m                 Constitutional: Pt appears in NAD               HENT: Head: NCAT.                Right Ear: External ear normal.                 Left Ear: External ear normal.                Eyes: . Pupils are equal, round, and reactive to light. Conjunctivae and EOM are normal               Nose: without d/c or deformity               Neck: Neck supple. Gross normal ROM               Cardiovascular: Normal rate and regular rhythm.                 Pulmonary/Chest: Effort normal and breath sounds without rales or wheezing.                Abd:  Soft, NT, ND, + BS, no organomegaly               Neurological: Pt is alert. At baseline orientation, motor grossly intact               Skin: Skin is warm. No rashes, no other new lesions, LE edema - none               Psychiatric: Pt behavior is normal without  agitation   Micro: none  Cardiac tracings I have personally interpreted today:  none  Pertinent Radiological findings (summarize): none   Lab Results  Component Value Date   WBC 9.6 02/02/2021   HGB 14.6 02/02/2021   HCT 42.8 02/02/2021   PLT 123.0 (L) 02/02/2021   GLUCOSE 81 02/02/2021   CHOL 129 02/02/2021   TRIG 107.0 02/02/2021   HDL 39.70 02/02/2021   LDLDIRECT 92.0 02/06/2017   LDLCALC 68 02/02/2021   ALT 18 02/02/2021   AST 17 02/02/2021   NA 139 02/02/2021   K 3.7 02/02/2021   CL 101 02/02/2021   CREATININE 0.95 02/02/2021   BUN 22 02/02/2021   CO2 27 02/02/2021   TSH 3.12 02/02/2021   PSA 2.15 07/14/2016   INR 1.1 09/24/2008   HGBA1C 6.3 02/02/2021   MICROALBUR 0.8 02/02/2021   Assessment/Plan:  [name] is a 87 y.o. White or Caucasian [1] male with  has a past medical history of AAA (abdominal aortic aneurysm), Abdominal aneurysm without mention of rupture (07/24/2012), Acute sinus infection (06/17/2009), Allergic rhinitis, Anxiety state (07/20/2007), Arthritis, Asymptomatic stenosis of left carotid artery without  infarction, Bilateral carotid artery stenosis, BPH (benign prostatic hypertrophy), Coronary artery calcification seen on CT scan (09/05/2017), Diabetes mellitus type 2, noninsulin dependent ([place]) (07/20/2007), Dilated cardiomyopathy ([place]) (12/14/2018), Diverticulosis of colon, Hearing loss of both ears (10/23/2013), History of cardiomyopathy, History of kidney stones, HTN (hypertension) (01/14/2016), Hypercalcemia (03/15/2019), Hyperlipidemia, Hypertension, Hypertensive heart disease without CHF, Obesity (BMI 30-39.9), Paresthesia of both feet (07/14/2016), Peripheral vascular disease ([place]), Personal history of kidney stones, Phimosis, PHN (postherpetic neuralgia) (09/18/2019), Preop exam for internal medicine (05/10/2016), Preventative health care (01/14/2016), Rash (05/01/2015), Right carotid artery occlusion, Rotator cuff arthropathy of both shoulders (04/10/2019),  Spinal stenosis at L4-L5 level (06/08/2016), Stroke ([place]), Type 2 diabetes mellitus ([place]), and Wears hearing aid.  Encounter for well adult exam with abnormal findings Age and sex appropriate education and counseling updated with regular exercise and diet Referrals for preventative services - none needed Immunizations addressed - covid booster Smoking counseling  - none needed Evidence for depression or other mood disorder - none significant Most recent labs reviewed. I have personally reviewed and have noted: 1) the patient's medical and social history 2) The patient's current medications and supplements 3) The patient's height, weight, and BMI have been recorded in the chart   HTN (hypertension) BP Readings from Last 3 Encounters:  02/15/21 100/76  11/11/20 (!) 96/52  08/03/20 112/70   low normal, BP overcontrolled, to d/c hct, consider decreased coreg if persists    Hyperlipidemia Lab Results  Component Value Date   LDLCALC 68 02/02/2021   Stable, pt to continue current statin ipitor   Type 2 diabetes mellitus ([place]) Lab Results  Component Value Date   HGBA1C 6.3 02/02/2021   Low normal CBGs now overcontrolled with wt loss recent, pt to continue current medical treatment jardiance, glucotrol, but decreased metformin 500 bid  Followup: Return in about 6 months (around 08/15/2021).  Cathlean Cower, MD 02/15/2021 8:10 PM [facility]

## 2021-02-15 NOTE — Assessment & Plan Note (Addendum)
Lab Results  Component Value Date   HGBA1C 6.3 02/02/2021   Low normal CBGs now overcontrolled with wt loss recent, pt to continue current medical treatment jardiance, glucotrol, but decreased metformin 500 bid

## 2021-02-15 NOTE — Assessment & Plan Note (Signed)
Age and sex appropriate education and counseling updated with regular exercise and diet Referrals for preventative services - none needed Immunizations addressed - covid booster Smoking counseling  - none needed Evidence for depression or other mood disorder - none significant Most recent labs reviewed. I have personally reviewed and have noted: 1) the patient's medical and social history 2) The patient's current medications and supplements 3) The patient's height, weight, and BMI have been recorded in the chart

## 2021-02-15 NOTE — Assessment & Plan Note (Signed)
BP Readings from Last 3 Encounters:  02/15/21 100/76  11/11/20 (!) 96/52  08/03/20 112/70   low normal, BP overcontrolled, to d/c hct, consider decreased coreg if persists

## 2021-02-15 NOTE — Assessment & Plan Note (Signed)
Lab Results  Component Value Date   LDLCALC 68 02/02/2021   Stable, pt to continue current statin ipitor

## 2021-02-15 NOTE — Patient Instructions (Signed)
Ok to STOP the hydrochlorothiazide (HCT) - the 25 mg fluid pill  Ok to take HALF of the metformin  - so only take 500 mg twice per day  Please continue all other medications as before, and refills have been done if requested.  Please have the pharmacy call with any other refills you may need.  Please continue your efforts at being more active, low cholesterol diet, and weight control.  You are otherwise up to date with prevention measures today.  Please keep your appointments with your specialists as you may have planned  Please make an Appointment to return in 6 months, or sooner if needed, also with Lab Appointment for testing done 3-5 days before at the [facility] (so this is for TWO appointments - please see the scheduling desk as you leave)  Due to the ongoing Covid 19 pandemic, our lab now requires an appointment for any labs done at our office.  If you need labs done and do not have an appointment, please call our office ahead of time to schedule before presenting to the lab for your testing.

## 2021-03-11 ENCOUNTER — Ambulatory Visit (HOSPITAL_COMMUNITY)
Admission: RE | Admit: 2021-03-11 | Discharge: 2021-03-11 | Disposition: A | Discharge status: Home / Self Care | Payer: Medicare HMO | Source: Ambulatory Visit | Attending: Cardiovascular Disease | Admitting: Cardiovascular Disease

## 2021-03-11 ENCOUNTER — Other Ambulatory Visit: Payer: Self-pay

## 2021-03-11 DIAGNOSIS — I10 Essential (primary) hypertension: Secondary | ICD-10-CM | POA: Insufficient documentation

## 2021-03-11 DIAGNOSIS — I6521 Occlusion and stenosis of right carotid artery: Secondary | ICD-10-CM | POA: Diagnosis not present

## 2021-03-11 DIAGNOSIS — I420 Dilated cardiomyopathy: Secondary | ICD-10-CM | POA: Diagnosis not present

## 2021-03-11 DIAGNOSIS — I7143 Infrarenal abdominal aortic aneurysm, without rupture: Secondary | ICD-10-CM | POA: Diagnosis not present

## 2021-03-16 ENCOUNTER — Telehealth: Payer: Self-pay

## 2021-03-16 NOTE — Telephone Encounter (Signed)
Patient notified of results, he's schedule on March 8th  at 10am.

## 2021-03-16 NOTE — Telephone Encounter (Signed)
-----   Message from Park Liter, MD sent at 03/15/2021 11:30 AM EST ----- Abdominal aorta 3.7 cm.  Continue follow-up

## 2021-03-30 ENCOUNTER — Other Ambulatory Visit (HOSPITAL_COMMUNITY): Payer: Self-pay | Admitting: Cardiology

## 2021-03-30 DIAGNOSIS — I714 Abdominal aortic aneurysm, without rupture, unspecified: Secondary | ICD-10-CM

## 2021-04-12 ENCOUNTER — Ambulatory Visit: Payer: Medicare HMO | Admitting: Cardiology

## 2021-04-12 ENCOUNTER — Other Ambulatory Visit: Payer: Self-pay

## 2021-04-12 ENCOUNTER — Encounter: Payer: Self-pay | Admitting: Cardiology

## 2021-04-12 ENCOUNTER — Ambulatory Visit (INDEPENDENT_AMBULATORY_CARE_PROVIDER_SITE_OTHER): Payer: Medicare HMO

## 2021-04-12 VITALS — BP 106/62 | HR 48 | Ht 71.0 in | Wt 242.0 lb

## 2021-04-12 DIAGNOSIS — E1165 Type 2 diabetes mellitus with hyperglycemia: Secondary | ICD-10-CM

## 2021-04-12 DIAGNOSIS — E782 Mixed hyperlipidemia: Secondary | ICD-10-CM

## 2021-04-12 DIAGNOSIS — I7143 Infrarenal abdominal aortic aneurysm, without rupture: Secondary | ICD-10-CM

## 2021-04-12 DIAGNOSIS — I10 Essential (primary) hypertension: Secondary | ICD-10-CM | POA: Diagnosis not present

## 2021-04-12 DIAGNOSIS — I420 Dilated cardiomyopathy: Secondary | ICD-10-CM

## 2021-04-12 DIAGNOSIS — R001 Bradycardia, unspecified: Secondary | ICD-10-CM | POA: Diagnosis not present

## 2021-04-12 NOTE — Patient Instructions (Signed)
Medication Instructions:  ?Your physician recommends that you continue on your current medications as directed. Please refer to the Current Medication list given to you today. ? ?*If you need a refill on your cardiac medications before your next appointment, please call your pharmacy* ? ? ?Lab Work: ?None ?If you have labs (blood work) drawn today and your tests are completely normal, you will receive your results only by: ?MyChart Message (if you have MyChart) OR ?A paper copy in the mail ?If you have any lab test that is abnormal or we need to change your treatment, we will call you to review the results. ? ? ?Testing/Procedures: ?CT-scan of the chest  ? ?A zio monitor was ordered today. It will remain on for 14 days. You will then return monitor and event diary in provided box. It takes 1-2 weeks for report to be downloaded and returned to [place]. We will call you with the results. If monitor falls off or has orange flashing light, please call [employer] for further instructions.  ? ? ? ?Follow-Up: ?At [facility], you and your health needs are our priority.  As part of our continuing mission to provide you with exceptional heart care, we have created designated Provider Care Teams.  These Care Teams include your primary Cardiologist (physician) and Advanced Practice Providers (APPs -  Physician Assistants and Nurse Practitioners) who all work together to provide you with the care you need, when you need it. ? ?We recommend signing up for the patient portal called "MyChart".  Sign up information is provided on this After Visit Summary.  MyChart is used to connect with patients for Virtual Visits (Telemedicine).  Patients are able to view lab/test results, encounter notes, upcoming appointments, etc.  Non-urgent messages can be sent to your provider as well.   ?To learn more about what you can do with MyChart, go to [email or web address].   ? ?Your next appointment:   ?6 month(s) ? ?The format for your next appointment:    ?In Person ? ?Provider:   ?Jenne Campus, MD  ? ? ?Other Instructions ?None  ?

## 2021-04-12 NOTE — Addendum Note (Signed)
Addended by: Edwyna Shell I on: 04/12/2021 10:38 AM ? ? Modules accepted: Orders ? ?

## 2021-04-12 NOTE — Progress Notes (Signed)
Cardiology Office Note:    Date:  04/12/2021   ID:  [name], DOB [birth year], MRN [id number]  PCP:  Biagio Borg, MD  Cardiologist:  Jenne Campus, MD    Referring MD: Biagio Borg, MD   Chief Complaint  Patient presents with   Follow-up    History of Present Illness:    [name] is a 87 y.o. male with past medical history significant for abdominal arctic aneurysm measuring 37 mm, he also enlargement of the ascending thoracic aorta with measurements of 40 mmHg, remote coronary artery disease, cardiomyopathy with stable ejection fraction Nebido 45%, essential hypertension, diabetes.  He comes today to my office  for follow-up.  Overall he said he is doing well if a problem with the hip which prevents him from exercising more.  But denies having chest pain tightness squeezing pressure mid chest  Past Medical History:  Diagnosis Date   AAA (abdominal aortic aneurysm)    MONITORED BY DR Kellie Simmering LOV JUNE 2014--  STABLE  AT 3.7   Abdominal aneurysm without mention of rupture 07/24/2012   Acute sinus infection 06/17/2009   Qualifier: Diagnosis of  By: Jenny Reichmann MD, Hunt Oris    Allergic rhinitis    Anxiety state 07/20/2007   Arthritis    Asymptomatic stenosis of left carotid artery without infarction    MILD DISEASE   Bilateral carotid artery stenosis    BPH (benign prostatic hypertrophy)    Coronary artery calcification seen on CT scan 09/05/2017   Diabetes mellitus type 2, noninsulin dependent ([place]) 07/20/2007   Dilated cardiomyopathy ([place]) 12/14/2018   Diverticulosis of colon    Hearing loss of both ears 10/23/2013   History of cardiomyopathy    IDIOPATHIC-- PER [facility] NOTE 2013 , CURRENTLY RESOLVED   History of kidney stones    HTN (hypertension) 01/14/2016   Hypercalcemia 03/15/2019   Hyperlipidemia    Hypertension    Hypertensive heart disease without CHF    Obesity (BMI 30-39.9)    Paresthesia of both feet 07/14/2016   Peripheral vascular disease ([place])    Personal  history of kidney stones    Phimosis    PHN (postherpetic neuralgia) 09/18/2019   Preop exam for internal medicine 05/10/2016   Preventative health care 01/14/2016   Rash 05/01/2015   Right carotid artery occlusion    CHRONIC   WITHOUT INFARTION   Rotator cuff arthropathy of both shoulders 04/10/2019   Spinal stenosis at L4-L5 level 06/08/2016   Stroke ([place])     Right eye   Type 2 diabetes mellitus ([place])    Wears hearing aid     Past Surgical History:  Procedure Laterality Date   CARDIOVASCULAR STRESS TEST  JUNE 2008  DR Wynonia Lawman   CATARACT EXTRACTION W/ INTRAOCULAR LENS IMPLANT Right    CIRCUMCISION N/A 08/30/2012   Procedure: CIRCUMCISION ADULT;  Surgeon: Malka So, MD;  Location: [facility];  Service: Urology;  Laterality: N/A;   CYSTO/  RIGHT URETERAL STENT PLACEMENT  10-16-2003   EXTRACORPOREAL SHOCK WAVE LITHOTRIPSY Right 2005   EYE SURGERY     JOINT REPLACEMENT Bilateral 2008  2009   Knee   LUMBAR LAMINECTOMY/DECOMPRESSION MICRODISCECTOMY Bilateral 06/08/2016   Procedure: Bilateral Microlumbar decompression L4-L5 and L5-S1;  Surgeon: Susa Day, MD;  Location: [facility];  Service: Orthopedics;  Laterality: Bilateral;  Requests 2 hours   TOTAL KNEE ARTHROPLASTY Bilateral RIGHT 10-08-2007;   LEFT 09-30-2008   TRANSTHORACIC ECHOCARDIOGRAM  10-21-2009   LVEF 50%  Current Medications: Current Meds  Medication Sig   atorvastatin (LIPITOR) 80 MG tablet Take 1 tablet (80 mg total) by mouth daily.   beta carotene w/minerals (OCUVITE) tablet Take 1 tablet by mouth daily. Unknown strength   carvedilol (COREG) 12.5 MG tablet 1 tab by mouth twice per day (Patient taking differently: Take 12.5 mg by mouth 2 (two) times daily with a meal. 1 tab by mouth twice per day)   chlorhexidine (PERIDEX) 0.12 % solution Use as directed 15 mLs in the mouth or throat 2 (two) times daily.   clobetasol ointment (TEMOVATE) 0.05 % APPLY TOPICALLY TWICE DAILY (Patient taking differently: Apply  1 application. topically 2 (two) times daily.)   dexamethasone (DECADRON) 4 MG tablet Take 8 mg by mouth daily.   empagliflozin (JARDIANCE) 25 MG TABS tablet Take 1 tablet (25 mg total) by mouth daily.   gabapentin (NEURONTIN) 300 MG capsule Take 1 capsule (300 mg total) by mouth 3 (three) times daily.   glipiZIDE (GLUCOTROL XL) 10 MG 24 hr tablet Take 1 tablet (10 mg total) by mouth daily with breakfast.   HYDROcodone-acetaminophen (NORCO) 7.5-325 MG tablet Take 1 tablet by mouth at bedtime as needed for moderate pain.   IBU 800 MG tablet Take 800 mg by mouth every 6 (six) hours as needed for mild pain or moderate pain.   ketoconazole (NIZORAL) 2 % cream Apply topically 2 (two) times daily as needed for irritation. (Patient taking differently: Apply 1 application. topically 2 (two) times daily as needed for irritation.)   losartan (COZAAR) 25 MG tablet TAKE ONE (1) TABLET BY MOUTH TWO (2) TIMES DAILY (Patient taking differently: Take 25 mg by mouth 2 (two) times daily. TAKE ONE (1) TABLET BY MOUTH TWO (2) TIMES DAILY)   metFORMIN (GLUCOPHAGE) 1000 MG tablet Take 0.5 tablets (500 mg total) by mouth 2 (two) times daily with a meal.   Multiple Vitamin (MULTIVITAMIN WITH MINERALS) TABS tablet Take 1 tablet by mouth daily. Unknown strength   sildenafil (VIAGRA) 100 MG tablet Take 0.5-1 tablets (50-100 mg total) by mouth daily as needed for erectile dysfunction.     Allergies:   Patient has no known allergies.   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  Tobacco Use   Smoking status: Former    Packs/day: 0.75    Years: 25.00    Pack years: 18.75    Types: Cigarettes    Quit date: 07/25/1982    Years since  quitting: 38.7   Smokeless tobacco: Never  Vaping Use   Vaping Use: Never used  Substance and Sexual Activity   Alcohol use: No    Alcohol/week: 0.0 standard drinks    Comment: recovering alcoholic  [id number]   Drug use: No   Sexual activity: Not on file  Other Topics Concern   Not on file  Social History Narrative   Not on file   Social Determinants of Health   Financial Resource Strain: Low Risk    Difficulty of Paying Living Expenses: Not hard at all  Food Insecurity: No Food Insecurity   Worried About [place] in the Last Year: Never true  Ran Out of Food in the Last Year: Never true  Transportation Needs: No Transportation Needs   Lack of Transportation (Medical): No   Lack of Transportation (Non-Medical): No  Physical Activity: Insufficiently Active   Days of Exercise per Week: 3 days   Minutes of Exercise per Session: 30 min  Stress: No Stress Concern Present   Feeling of Stress : Not at all  Social Connections: Unknown   Frequency of Communication with Friends and Family: More than three times a week   Frequency of Social Gatherings with Friends and Family: More than three times a week   Attends Religious Services: 1 to 4 times per year   Active Member of [employer] or Organizations: No   Attends Music therapist: 1 to 4 times per year   Marital Status: Not on file     Family History: The patient's family history includes ALS in his father; Asthma in his mother; COPD in his mother; Depression in his mother; Diabetes in his father; Heart disease in his mother; Hypertension in his mother; Kidney disease in his mother. There is no history of Colon cancer. ROS:   Please see the history of present illness.    All 14 point review of systems negative except as described per history of present illness  EKGs/Labs/Other Studies Reviewed:      Recent Labs: 02/02/2021: ALT 18; BUN 22; Creatinine, Ser 0.95; Hemoglobin 14.6; Platelets 123.0; Potassium 3.7;  Sodium 139; TSH 3.12  Recent Lipid Panel    Component Value Date/Time   CHOL 129 02/02/2021 0756   CHOL 124 03/11/2020 1108   TRIG 107.0 02/02/2021 0756   HDL 39.70 02/02/2021 0756   HDL 41 03/11/2020 1108   CHOLHDL 3 02/02/2021 0756   VLDL 21.4 02/02/2021 0756   LDLCALC 68 02/02/2021 0756   LDLCALC 67 03/11/2020 1108   LDLDIRECT 92.0 02/06/2017 1119    Physical Exam:    VS:  BP 106/62 (BP Location: Left Arm, Patient Position: Sitting)    Pulse (!) 48    Ht '5\' 11"'$  (1.803 m)    Wt 242 lb (109.8 kg)    SpO2 94%    BMI 33.75 kg/m     Wt Readings from Last 3 Encounters:  04/12/21 242 lb (109.8 kg)  02/15/21 235 lb (106.6 kg)  11/11/20 231 lb (104.8 kg)     GEN:  Well nourished, well developed in no acute distress HEENT: Normal NECK: No JVD; No carotid bruits LYMPHATICS: No lymphadenopathy CARDIAC: RRR, no murmurs, no rubs, no gallops RESPIRATORY:  Clear to auscultation without rales, wheezing or rhonchi  ABDOMEN: Soft, non-tender, non-distended MUSCULOSKELETAL:  No edema; No deformity  SKIN: Warm and dry LOWER EXTREMITIES: no swelling NEUROLOGIC:  Alert and oriented x 3 PSYCHIATRIC:  Normal affect   ASSESSMENT:    1. Infrarenal abdominal aortic aneurysm (AAA) without rupture   2. Dilated cardiomyopathy ([place])   3. Primary hypertension   4. Type 2 diabetes mellitus with hyperglycemia, without long-term current use of insulin (HCC)   5. Mixed hyperlipidemia    PLAN:    In order of problems listed above:  Infrarenal abdominal aneurysm stable 37 mm based on last ultrasound.  Interestingly he also had enlargement of the aorta and ascending aorta what we did not see his aortic arch as well as descending thoracic aorta I will schedule him to have a CT of the chest with contrast to look at his central aortic.  His kidney function is normal History of  dilated cardiomyopathy stable difficulty putting on appropriate medication because of low blood pressure.  So far he is only on  Coreg.  Also counseled.  We will continue that management. Type 2 diabetes management followed by internal medicine team I did review K PN which only last hemoglobin A1c of 6.3 from 02/02/2021.  Continue present management Dyslipidemia I did review K PN which showed LDL of 68 HDL 39 this is from 1227 2 he is on Lipitor 80 which is high intense statin which I will continue Says bradycardia with frequent PVCs noted on the EKG asked her to wear Zio patch for 2 weeks.  If he does not have any significant arrhythmia   Medication Adjustments/Labs and Tests Ordered: Current medicines are reviewed at length with the patient today.  Concerns regarding medicines are outlined above.  No orders of the defined types were placed in this encounter.  Medication changes: No orders of the defined types were placed in this encounter.   Signed, Park Liter, MD, [facility] 04/12/2021 10:22 AM    [place]

## 2021-04-22 ENCOUNTER — Ambulatory Visit (HOSPITAL_BASED_OUTPATIENT_CLINIC_OR_DEPARTMENT_OTHER)
Admission: RE | Admit: 2021-04-22 | Discharge: 2021-04-22 | Disposition: A | Discharge status: Home / Self Care | Payer: Medicare HMO | Source: Ambulatory Visit | Attending: Cardiology | Admitting: Cardiology

## 2021-04-27 ENCOUNTER — Telehealth: Payer: Self-pay | Admitting: Cardiology

## 2021-04-27 DIAGNOSIS — H3563 Retinal hemorrhage, bilateral: Secondary | ICD-10-CM | POA: Diagnosis not present

## 2021-04-27 DIAGNOSIS — H353113 Nonexudative age-related macular degeneration, right eye, advanced atrophic without subfoveal involvement: Secondary | ICD-10-CM | POA: Diagnosis not present

## 2021-04-27 DIAGNOSIS — H35372 Puckering of macula, left eye: Secondary | ICD-10-CM | POA: Diagnosis not present

## 2021-04-27 DIAGNOSIS — H353122 Nonexudative age-related macular degeneration, left eye, intermediate dry stage: Secondary | ICD-10-CM | POA: Diagnosis not present

## 2021-04-27 DIAGNOSIS — H34211 Partial retinal artery occlusion, right eye: Secondary | ICD-10-CM | POA: Diagnosis not present

## 2021-04-27 DIAGNOSIS — I10 Essential (primary) hypertension: Secondary | ICD-10-CM

## 2021-04-27 LAB — HM DIABETES EYE EXAM

## 2021-04-27 NOTE — Telephone Encounter (Signed)
Spoke with patient aware lab order on file. Will stop by today. Aware of [facility] new location. ?

## 2021-04-27 NOTE — Telephone Encounter (Signed)
Patient called in to say that he supposed to have labs done but there is no orders. Please advise ?

## 2021-04-28 DIAGNOSIS — I10 Essential (primary) hypertension: Secondary | ICD-10-CM | POA: Diagnosis not present

## 2021-04-29 ENCOUNTER — Other Ambulatory Visit: Payer: Self-pay

## 2021-04-29 ENCOUNTER — Encounter (HOSPITAL_BASED_OUTPATIENT_CLINIC_OR_DEPARTMENT_OTHER): Payer: Self-pay

## 2021-04-29 ENCOUNTER — Ambulatory Visit (HOSPITAL_BASED_OUTPATIENT_CLINIC_OR_DEPARTMENT_OTHER)
Admission: RE | Admit: 2021-04-29 | Discharge: 2021-04-29 | Disposition: A | Discharge status: Home / Self Care | Payer: Medicare HMO | Source: Ambulatory Visit | Attending: Cardiology | Admitting: Cardiology

## 2021-04-29 DIAGNOSIS — I77810 Thoracic aortic ectasia: Secondary | ICD-10-CM | POA: Diagnosis not present

## 2021-04-29 DIAGNOSIS — J90 Pleural effusion, not elsewhere classified: Secondary | ICD-10-CM | POA: Diagnosis not present

## 2021-04-29 DIAGNOSIS — I7143 Infrarenal abdominal aortic aneurysm, without rupture: Secondary | ICD-10-CM | POA: Diagnosis not present

## 2021-04-29 DIAGNOSIS — I10 Essential (primary) hypertension: Secondary | ICD-10-CM | POA: Diagnosis not present

## 2021-04-29 DIAGNOSIS — E782 Mixed hyperlipidemia: Secondary | ICD-10-CM | POA: Insufficient documentation

## 2021-04-29 DIAGNOSIS — I420 Dilated cardiomyopathy: Secondary | ICD-10-CM | POA: Insufficient documentation

## 2021-04-29 DIAGNOSIS — I700 Atherosclerosis of aorta: Secondary | ICD-10-CM | POA: Diagnosis not present

## 2021-04-29 DIAGNOSIS — E1165 Type 2 diabetes mellitus with hyperglycemia: Secondary | ICD-10-CM | POA: Insufficient documentation

## 2021-04-29 DIAGNOSIS — J9811 Atelectasis: Secondary | ICD-10-CM | POA: Diagnosis not present

## 2021-04-29 LAB — BASIC METABOLIC PANEL
BUN/Creatinine Ratio: 16 (ref 10–24)
BUN: 15 mg/dL (ref 8–27)
CO2: 17 mmol/L — ABNORMAL LOW (ref 20–29)
Calcium: 9.2 mg/dL (ref 8.6–10.2)
Chloride: 104 mmol/L (ref 96–106)
Creatinine, Ser: 0.91 mg/dL (ref 0.76–1.27)
Glucose: 106 mg/dL — ABNORMAL HIGH (ref 70–99)
Potassium: 4.7 mmol/L (ref 3.5–5.2)
Sodium: 140 mmol/L (ref 134–144)
eGFR: 83 mL/min/{1.73_m2} (ref >59)

## 2021-04-29 MED ORDER — IOHEXOL 300 MG/ML  SOLN
100.0000 mL | Freq: Once | INTRAMUSCULAR | Status: AC | PRN
  Administered 2021-04-29: 80 mL via INTRAVENOUS

## 2021-04-30 DIAGNOSIS — I420 Dilated cardiomyopathy: Secondary | ICD-10-CM | POA: Diagnosis not present

## 2021-04-30 DIAGNOSIS — I10 Essential (primary) hypertension: Secondary | ICD-10-CM | POA: Diagnosis not present

## 2021-05-24 ENCOUNTER — Telehealth: Payer: Self-pay

## 2021-05-24 ENCOUNTER — Ambulatory Visit (INDEPENDENT_AMBULATORY_CARE_PROVIDER_SITE_OTHER): Payer: Medicare HMO | Admitting: Internal Medicine

## 2021-05-24 ENCOUNTER — Ambulatory Visit (INDEPENDENT_AMBULATORY_CARE_PROVIDER_SITE_OTHER): Payer: Medicare HMO

## 2021-05-24 ENCOUNTER — Encounter: Payer: Self-pay | Admitting: Internal Medicine

## 2021-05-24 VITALS — BP 112/72 | HR 57 | Temp 97.9°F | Ht 71.0 in | Wt 242.0 lb

## 2021-05-24 DIAGNOSIS — E559 Vitamin D deficiency, unspecified: Secondary | ICD-10-CM

## 2021-05-24 DIAGNOSIS — I10 Essential (primary) hypertension: Secondary | ICD-10-CM

## 2021-05-24 DIAGNOSIS — Z0001 Encounter for general adult medical examination with abnormal findings: Secondary | ICD-10-CM | POA: Diagnosis not present

## 2021-05-24 DIAGNOSIS — E119 Type 2 diabetes mellitus without complications: Secondary | ICD-10-CM | POA: Diagnosis not present

## 2021-05-24 DIAGNOSIS — R0602 Shortness of breath: Secondary | ICD-10-CM | POA: Diagnosis not present

## 2021-05-24 DIAGNOSIS — J90 Pleural effusion, not elsewhere classified: Secondary | ICD-10-CM

## 2021-05-24 DIAGNOSIS — I472 Ventricular tachycardia, unspecified: Secondary | ICD-10-CM

## 2021-05-24 NOTE — Progress Notes (Signed)
Patient ID: [name], male   DOB: [birth year], 87 y.o.   MRN: [id number] ? ? ? ?     Chief Complaint:: wellness exam and Office Visit (Discuss test results from Dr. Agustin Cree) ? Right pleural effusion > left, low vit dm htn, hld ? ?     HPI:  [name] is a 87 y.o. male here for wellness exam; decliens covid booster, o/w up to date ?         ?              Also Pt denies chest pain, increased sob or doe, wheezing, orthopnea, PND, increased LE swelling, palpitations, dizziness or syncope.   Pt denies polydipsia, polyuria, or new focal neuro s/s.    Pt denies fever, wt loss, night sweats, loss of appetite, or other constitutional symptoms   Pt denies fever, wt loss, night sweats, loss of appetite, or other constitutional symptoms  Did have recent cxr per cardiology with mod right pleural effusion, small left.  Note diuretic stopped dec 2022 ?  ?Wt Readings from Last 3 Encounters:  ?05/24/21 242 lb (109.8 kg)  ?04/12/21 242 lb (109.8 kg)  ?02/15/21 235 lb (106.6 kg)  ? ?BP Readings from Last 3 Encounters:  ?05/24/21 112/72  ?04/12/21 106/62  ?02/15/21 100/76  ? ?Immunization History  ?Administered Date(s) Administered  ? Fluad Quad(high Dose 65+) 12/14/2018  ? Influenza Whole 12/08/2008  ? Influenza, High Dose Seasonal PF 11/02/2017, 11/15/2019  ? Influenza,inj,Quad PF,6+ Mos 10/23/2013, 10/24/2014  ? Influenza-Unspecified 01/19/2021  ? PFIZER(Purple Top)SARS-COV-2 Vaccination 04/08/2019, 05/09/2019, 12/11/2019, 11/02/2020  ? Pneumococcal Conjugate-13 04/19/2013  ? Pneumococcal Polysaccharide-23 08/22/2008  ? Td 02/07/1993, 08/22/2008  ? Tdap 09/12/2018  ? Zoster Recombinat (Shingrix) 05/24/2017, 09/26/2017  ? ?There are no preventive care reminders to display for this patient. ? ?  ? ?Past Medical History:  ?Diagnosis Date  ? AAA (abdominal aortic aneurysm) ([place])   ? MONITORED BY DR LAWSON LOV JUNE 2014--  STABLE  AT 3.7  ? Abdominal aneurysm without mention of rupture 07/24/2012  ? Acute sinus infection  06/17/2009  ? Qualifier: Diagnosis of  By: Jenny Reichmann MD, Hunt Oris   ? Allergic rhinitis   ? Anxiety state 07/20/2007  ? Arthritis   ? Asymptomatic stenosis of left carotid artery without infarction   ? MILD DISEASE  ? Bilateral carotid artery stenosis   ? BPH (benign prostatic hypertrophy)   ? Coronary artery calcification seen on CT scan 09/05/2017  ? Diabetes mellitus type 2, noninsulin dependent ([place]) 07/20/2007  ? Dilated cardiomyopathy ([place]) 12/14/2018  ? Diverticulosis of colon   ? Hearing loss of both ears 10/23/2013  ? History of cardiomyopathy   ? IDIOPATHIC-- PER [facility] NOTE 2013 , CURRENTLY RESOLVED  ? History of kidney stones   ? HTN (hypertension) 01/14/2016  ? Hypercalcemia 03/15/2019  ? Hyperlipidemia   ? Hypertension   ? Hypertensive heart disease without CHF   ? Obesity (BMI 30-39.9)   ? Paresthesia of both feet 07/14/2016  ? Peripheral vascular disease ([place])   ? Personal history of kidney stones   ? Phimosis   ? PHN (postherpetic neuralgia) 09/18/2019  ? Preop exam for internal medicine 05/10/2016  ? Preventative health care 01/14/2016  ? Rash 05/01/2015  ? Right carotid artery occlusion   ? CHRONIC   WITHOUT INFARTION  ? Rotator cuff arthropathy of both shoulders 04/10/2019  ? Spinal stenosis at L4-L5 level 06/08/2016  ? Stroke [facility])   ?  Right eye  ?  Type 2 diabetes mellitus ([place])   ? Wears hearing aid   ? ?Past Surgical History:  ?Procedure Laterality Date  ? CARDIOVASCULAR STRESS TEST  JUNE 2008  DR Wynonia Lawman  ? CATARACT EXTRACTION W/ INTRAOCULAR LENS IMPLANT Right   ? CIRCUMCISION N/A 08/30/2012  ? Procedure: CIRCUMCISION ADULT;  Surgeon: Malka So, MD;  Location: [facility];  Service: Urology;  Laterality: N/A;  ? CYSTO/  RIGHT URETERAL STENT PLACEMENT  10-16-2003  ? EXTRACORPOREAL SHOCK WAVE LITHOTRIPSY Right 2005  ? EYE SURGERY    ? JOINT REPLACEMENT Bilateral 2008  2009  ? Knee  ? LUMBAR LAMINECTOMY/DECOMPRESSION MICRODISCECTOMY Bilateral 06/08/2016  ? Procedure: Bilateral Microlumbar decompression  L4-L5 and L5-S1;  Surgeon: Susa Day, MD;  Location: [facility];  Service: Orthopedics;  Laterality: Bilateral;  Requests 2 hours  ? TOTAL KNEE ARTHROPLASTY Bilateral RIGHT 10-08-2007;   LEFT 09-30-2008  ? TRANSTHORACIC ECHOCARDIOGRAM  10-21-2009  ? LVEF 50%  ? ? reports that he quit smoking about 38 years ago. His smoking use included cigarettes. He has a 18.75 pack-year smoking history. He has never used smokeless tobacco. He reports that he does not drink alcohol and does not use drugs. ?family history includes ALS in his father; Asthma in his mother; COPD in his mother; Depression in his mother; Diabetes in his father; Heart disease in his mother; Hypertension in his mother; Kidney disease in his mother. ?No Known Allergies ?Current Outpatient Medications on File Prior to Visit  ?Medication Sig Dispense Refill  ? atorvastatin (LIPITOR) 80 MG tablet Take 1 tablet (80 mg total) by mouth daily. 90 tablet 3  ? beta carotene w/minerals (OCUVITE) tablet Take 1 tablet by mouth daily. Unknown strength    ? chlorhexidine (PERIDEX) 0.12 % solution Use as directed 15 mLs in the mouth or throat 2 (two) times daily.    ? clobetasol ointment (TEMOVATE) 0.05 % APPLY TOPICALLY TWICE DAILY (Patient taking differently: Apply 1 application. topically 2 (two) times daily.) 30 g 1  ? dexamethasone (DECADRON) 4 MG tablet Take 8 mg by mouth daily.    ? empagliflozin (JARDIANCE) 25 MG TABS tablet Take 1 tablet (25 mg total) by mouth daily. 90 tablet 3  ? gabapentin (NEURONTIN) 300 MG capsule Take 1 capsule (300 mg total) by mouth 3 (three) times daily. 90 capsule 5  ? glipiZIDE (GLUCOTROL XL) 10 MG 24 hr tablet Take 1 tablet (10 mg total) by mouth daily with breakfast. 90 tablet 3  ? HYDROcodone-acetaminophen (NORCO) 7.5-325 MG tablet Take 1 tablet by mouth at bedtime as needed for moderate pain. 30 tablet 0  ? IBU 800 MG tablet Take 800 mg by mouth every 6 (six) hours as needed for mild pain or moderate pain.    ? ketoconazole  (NIZORAL) 2 % cream Apply topically 2 (two) times daily as needed for irritation. (Patient taking differently: Apply 1 application. topically 2 (two) times daily as needed for irritation.) 30 g 1  ? losartan (COZAAR) 25 MG tablet TAKE ONE (1) TABLET BY MOUTH TWO (2) TIMES DAILY (Patient taking differently: Take 25 mg by mouth 2 (two) times daily. TAKE ONE (1) TABLET BY MOUTH TWO (2) TIMES DAILY) 180 tablet 3  ? metFORMIN (GLUCOPHAGE) 1000 MG tablet Take 0.5 tablets (500 mg total) by mouth 2 (two) times daily with a meal. 90 tablet 3  ? Multiple Vitamin (MULTIVITAMIN WITH MINERALS) TABS tablet Take 1 tablet by mouth daily. Unknown strength    ? sildenafil (VIAGRA) 100 MG tablet  Take 0.5-1 tablets (50-100 mg total) by mouth daily as needed for erectile dysfunction. 5 tablet 11  ? ?No current facility-administered medications on file prior to visit.  ? ?     ROS:  All others reviewed and negative. ? ?Objective  ? ?     PE:  BP 112/72 (BP Location: Left Arm, Patient Position: Sitting, Cuff Size: Large)   Pulse (!) 57   Temp 97.9 ?F (36.6 ?C) (Oral)   Ht '5\' 11"'$  (1.803 m)   Wt 242 lb (109.8 kg)   SpO2 97%   BMI 33.75 kg/m?  ? ?              Constitutional: Pt appears in NAD ?              HENT: Head: NCAT.  ?              Right Ear: External ear normal.   ?              Left Ear: External ear normal.  ?              Eyes: . Pupils are equal, round, and reactive to light. Conjunctivae and EOM are normal ?              Nose: without d/c or deformity ?              Neck: Neck supple. Gross normal ROM ?              Cardiovascular: Normal rate and regular rhythm.   ?              Pulmonary/Chest: Effort normal and breath sounds without rales or wheezing.  ?              Abd:  Soft, NT, ND, + BS, no organomegaly ?              Neurological: Pt is alert. At baseline orientation, motor grossly intact ?              Skin: Skin is warm. No rashes, no other new lesions, LE edema - none ?              Psychiatric: Pt behavior  is normal without agitation  ? ?Micro: none ? ?Cardiac tracings I have personally interpreted today:  none ? ?Pertinent Radiological findings (summarize): CT Chest W Contrast  ?Apr 29, 2021 -  ?IMPRESSION: ?1. Aortic

## 2021-05-24 NOTE — Assessment & Plan Note (Signed)
Last vitamin D ?Lab Results  ?Component Value Date  ? VD25OH 34.53 02/02/2021  ? ?Low, to start oral replacement ? ?

## 2021-05-24 NOTE — Telephone Encounter (Signed)
-----   Message from Park Liter, MD sent at 05/20/2021  8:06 AM EDT ----- ?Monitor showed 2 episode of ventricular tachycardia frequent ventricular ectopy with total burden of 26.9%.  Please refer him to EP for evaluation ?

## 2021-05-24 NOTE — Patient Instructions (Signed)
Please continue all other medications as before, and refills have been done if requested. ? ?Please have the pharmacy call with any other refills you may need. ? ?Please continue your efforts at being more active, low cholesterol diet, and weight control. ? ?You are otherwise up to date with prevention measures today. ? ?Please keep your appointments with your specialists as you may have planned ? ?You will be contacted regarding the referral for: pulmonary ? ?Please go to the XRAY Department in the first floor for the x-ray testing ? ?You will be contacted by phone if any changes need to be made immediately.  Otherwise, you will receive a letter about your results with an explanation, but please check with MyChart first. ? ?Please remember to sign up for MyChart if you have not done so, as this will be important to you in the future with finding out test results, communicating by private email, and scheduling acute appointments online when needed. ? ?Please make an Appointment to return in 3 months, or sooner if needed ?

## 2021-05-26 ENCOUNTER — Encounter: Payer: Self-pay | Admitting: Internal Medicine

## 2021-05-26 MED ORDER — CARVEDILOL 12.5 MG PO TABS: ORAL_TABLET | ORAL | 3 refills | Status: DC

## 2021-05-29 ENCOUNTER — Encounter: Payer: Self-pay | Admitting: Internal Medicine

## 2021-05-29 NOTE — Assessment & Plan Note (Signed)

## 2021-05-29 NOTE — Assessment & Plan Note (Signed)
Lab Results  ?Component Value Date  ? HGBA1C 6.3 02/02/2021  ? ?Stable, pt to continue current medical treatment jardiance, glucotrol, metfomrin ? ?

## 2021-05-29 NOTE — Assessment & Plan Note (Signed)
Etiology unclear, for f/u cxr today, and refer pulmonary ?

## 2021-06-01 ENCOUNTER — Telehealth: Payer: Self-pay | Admitting: Internal Medicine

## 2021-06-01 NOTE — Telephone Encounter (Signed)
LVM for pt to rtn my call to schedule AWV with [facility]. Please schedule if pt calls the office.  ?

## 2021-06-11 MED ORDER — KETOCONAZOLE 2 % EX CREA: TOPICAL_CREAM | Freq: Two times a day (BID) | CUTANEOUS | 1 refills | Status: DC | PRN

## 2021-06-11 NOTE — Addendum Note (Signed)
Addended by: Terence Lux A on: 06/11/2021 02:04 PM ? ? Modules accepted: Orders ? ?

## 2021-06-29 ENCOUNTER — Ambulatory Visit (INDEPENDENT_AMBULATORY_CARE_PROVIDER_SITE_OTHER): Payer: Medicare HMO

## 2021-06-29 ENCOUNTER — Encounter: Payer: Self-pay | Admitting: Internal Medicine

## 2021-06-29 ENCOUNTER — Ambulatory Visit: Payer: Medicare HMO | Admitting: Internal Medicine

## 2021-06-29 VITALS — BP 114/70 | HR 63 | Ht 71.0 in | Wt 241.0 lb

## 2021-06-29 DIAGNOSIS — I493 Ventricular premature depolarization: Secondary | ICD-10-CM

## 2021-06-29 DIAGNOSIS — I420 Dilated cardiomyopathy: Secondary | ICD-10-CM | POA: Diagnosis not present

## 2021-06-29 MED ORDER — CARVEDILOL 6.25 MG PO TABS: 6.2500 mg | ORAL_TABLET | Freq: Two times a day (BID) | ORAL | 3 refills | Status: DC

## 2021-06-29 MED ORDER — FUROSEMIDE 40 MG PO TABS: 40.0000 mg | ORAL_TABLET | Freq: Every day | ORAL | 3 refills | Status: DC

## 2021-06-29 MED ORDER — AMIODARONE HCL 200 MG PO TABS: 200.0000 mg | ORAL_TABLET | Freq: Every day | ORAL | 3 refills | Status: DC

## 2021-06-29 NOTE — Progress Notes (Signed)
HPI [name] is referred today by Dr. Raliegh Ip for evluation of PVC's and NSVT. He is a pleasant 87 yo man with a non-ischemic CM, EF 40-45%, AAA, and HTN. He also has DM. The patient wore a cardiac monitor several weeks ago which demonstrated NS VT, NS SVT and PVC's and PAC's. The patient had a total PVC burden of 27% but I am not sure that some of the PVC's were not PAC's. His main complaint today is sob. He has dyspnea with exertion. His HCTZ was stopped. He does not have palpitations.  No Known Allergies   Current Outpatient Medications  Medication Sig Dispense Refill   amiodarone (PACERONE) 200 MG tablet Take 1 tablet (200 mg total) by mouth daily. 90 tablet 3   atorvastatin (LIPITOR) 80 MG tablet Take 1 tablet (80 mg total) by mouth daily. 90 tablet 3   beta carotene w/minerals (OCUVITE) tablet Take 1 tablet by mouth daily. Unknown strength     carvedilol (COREG) 6.25 MG tablet Take 1 tablet (6.25 mg total) by mouth 2 (two) times daily. 180 tablet 3   chlorhexidine (PERIDEX) 0.12 % solution Use as directed 15 mLs in the mouth or throat 2 (two) times daily.     clobetasol ointment (TEMOVATE) 0.05 % APPLY TOPICALLY TWICE DAILY (Patient taking differently: Apply 1 application. topically 2 (two) times daily.) 30 g 1   dexamethasone (DECADRON) 4 MG tablet Take 8 mg by mouth daily.     empagliflozin (JARDIANCE) 25 MG TABS tablet Take 1 tablet (25 mg total) by mouth daily. 90 tablet 3   furosemide (LASIX) 40 MG tablet Take 1 tablet (40 mg total) by mouth daily. 90 tablet 3   gabapentin (NEURONTIN) 300 MG capsule Take 1 capsule (300 mg total) by mouth 3 (three) times daily. 90 capsule 5   glipiZIDE (GLUCOTROL XL) 10 MG 24 hr tablet Take 1 tablet (10 mg total) by mouth daily with breakfast. 90 tablet 3   HYDROcodone-acetaminophen (NORCO) 7.5-325 MG tablet Take 1 tablet by mouth at bedtime as needed for moderate pain. 30 tablet 0   IBU 800 MG tablet Take 800 mg by mouth every 6 (six) hours as  needed for mild pain or moderate pain.     ketoconazole (NIZORAL) 2 % cream Apply topically 2 (two) times daily as needed for irritation. 30 g 1   losartan (COZAAR) 25 MG tablet TAKE ONE (1) TABLET BY MOUTH TWO (2) TIMES DAILY (Patient taking differently: Take 25 mg by mouth 2 (two) times daily. TAKE ONE (1) TABLET BY MOUTH TWO (2) TIMES DAILY) 180 tablet 3   metFORMIN (GLUCOPHAGE) 1000 MG tablet Take 0.5 tablets (500 mg total) by mouth 2 (two) times daily with a meal. 90 tablet 3   Multiple Vitamin (MULTIVITAMIN WITH MINERALS) TABS tablet Take 1 tablet by mouth daily. Unknown strength     sildenafil (VIAGRA) 100 MG tablet Take 0.5-1 tablets (50-100 mg total) by mouth daily as needed for erectile dysfunction. 5 tablet 11   No current facility-administered medications for this visit.     Past Medical History:  Diagnosis Date   AAA (abdominal aortic aneurysm) ([place])    MONITORED BY DR LAWSON LOV JUNE 2014--  STABLE  AT 3.7   Abdominal aneurysm without mention of rupture 07/24/2012   Acute sinus infection 06/17/2009   Qualifier: Diagnosis of  By: Jenny Reichmann MD, Hunt Oris    Allergic rhinitis    Anxiety state 07/20/2007   Arthritis  Asymptomatic stenosis of left carotid artery without infarction    MILD DISEASE   Bilateral carotid artery stenosis    BPH (benign prostatic hypertrophy)    Coronary artery calcification seen on CT scan 09/05/2017   Diabetes mellitus type 2, noninsulin dependent ([place]) 07/20/2007   Dilated cardiomyopathy ([place]) 12/14/2018   Diverticulosis of colon    Hearing loss of both ears 10/23/2013   History of cardiomyopathy    IDIOPATHIC-- PER [facility] NOTE 2013 , CURRENTLY RESOLVED   History of kidney stones    HTN (hypertension) 01/14/2016   Hypercalcemia 03/15/2019   Hyperlipidemia    Hypertension    Hypertensive heart disease without CHF    Obesity (BMI 30-39.9)    Paresthesia of both feet 07/14/2016   Peripheral vascular disease ([place])    Personal history of kidney stones     Phimosis    PHN (postherpetic neuralgia) 09/18/2019   Preop exam for internal medicine 05/10/2016   Preventative health care 01/14/2016   Rash 05/01/2015   Right carotid artery occlusion    CHRONIC   WITHOUT INFARTION   Rotator cuff arthropathy of both shoulders 04/10/2019   Spinal stenosis at L4-L5 level 06/08/2016   Stroke ([place])     Right eye   Type 2 diabetes mellitus ([place])    Wears hearing aid     ROS:   All systems reviewed and negative except as noted in the HPI.   Past Surgical History:  Procedure Laterality Date   CARDIOVASCULAR STRESS TEST  JUNE 2008  DR TILLEY   CATARACT EXTRACTION W/ INTRAOCULAR LENS IMPLANT Right    CIRCUMCISION N/A 08/30/2012   Procedure: CIRCUMCISION ADULT;  Surgeon: Malka So, MD;  [facility];  Service: Urology;  Laterality: N/A;   CYSTO/  RIGHT URETERAL STENT PLACEMENT  10-16-2003   EXTRACORPOREAL SHOCK WAVE LITHOTRIPSY Right 2005   EYE SURGERY     JOINT REPLACEMENT Bilateral 2008  2009   Knee   LUMBAR LAMINECTOMY/DECOMPRESSION MICRODISCECTOMY Bilateral 06/08/2016   Procedure: Bilateral Microlumbar decompression L4-L5 and L5-S1;  Surgeon: Susa Day, MD;  Location: [facility];  Service: Orthopedics;  Laterality: Bilateral;  Requests 2 hours   TOTAL KNEE ARTHROPLASTY Bilateral RIGHT 10-08-2007;   LEFT 09-30-2008   TRANSTHORACIC ECHOCARDIOGRAM  10-21-2009   LVEF 50%     Family History  Problem Relation Age of Onset   Heart disease Mother        CHF  Before age 58   COPD Mother    Kidney disease Mother    Hypertension Mother    Asthma Mother    Depression Mother    ALS Father    Diabetes Father    Colon cancer Neg Hx      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    Comment:  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  Tobacco Use   Smoking status: Former    Packs/day: 0.75    Years: 25.00    Pack years: 18.75    Types: Cigarettes    Quit date: 07/25/1982    Years since quitting: 38.9   Smokeless tobacco: Never  Vaping Use   Vaping Use: Never used  Substance and Sexual Activity  Alcohol use: No    Alcohol/week: 0.0 standard drinks    Comment: recovering alcoholic  [id number]   Drug use: No   Sexual activity: Not on file  Other Topics Concern   Not on file  Social History Narrative   Not on file   Social Determinants of Health   Financial Resource Strain: Not on file  Food Insecurity: Not on file  Transportation Needs: Not on file  Physical Activity: Not on file  Stress: Not on file  Social Connections: Not on file  Intimate Partner Violence: Not on file     BP 114/70   Pulse 63   Ht '5\' 11"'$  (1.803 m)   Wt 241 lb (109.3 kg)   SpO2 97%   BMI 33.61 kg/m   Physical Exam:  Well appearing NAD HEENT: Unremarkable Neck:  No JVD, no thyromegally Lymphatics:  No adenopathy Back:  No CVA tenderness Lungs:  Clear with no wheezes HEART:  IRegular rate rhythm, no murmurs, no rubs, no clicks Abd:  soft, positive bowel sounds, no organomegally, no rebound, no guarding Ext:  2 plus pulses, no edema, no cyanosis, no clubbing Skin:  No rashes no nodules Neuro:  CN II through XII intact, motor grossly intact  EKG NSR with ILBBB and PVC's  Assess/Plan:  Dyspnea - this is his main problem and has worsened with stopping HCTZ. I have rcommended he start lasix 40 mg daily.  PVC's - I have recommended a trial of low dose amiodarone 200 mg daily Borderline bp - with starting lasix, I have asked him to reduce the dose of coreg to 6.25 bid.   Carleene Overlie Dannon Perlow,MD

## 2021-06-29 NOTE — Progress Notes (Unsigned)
Enrolled patient for a 14 day [employer] XT monitor to be mailed 1st of sept to patients home  Patient wore monitor early. [employer] cancelled charges

## 2021-06-29 NOTE — Patient Instructions (Addendum)
Medication Instructions:   Your physician has recommended you make the following change in your medication:    REDUCE your carvedilol-  Take 6.25 mg by mouth twice a day  2.    START taking furosemide 40 mg-  Take one tablet by mouth daily  3.  START taking amiodarone 200 mg-  Take one tablet by mouth daily.  REDUCE YOUR SALT INTAKE   Labwork: You will get lab work at the [facility] office in 2 weeks (June 6 or 7, 2023)--BMP  Testing/Procedures: Your physician has recommended that you wear a holter monitor. Holter monitors are medical devices that record the heart's electrical activity. Doctors most often use these monitors to diagnose arrhythmias. Arrhythmias are problems with the speed or rhythm of the heartbeat. The monitor is a small, portable device. You can wear one while you do your normal daily activities. This is usually used to diagnose what is causing palpitations/syncope (passing out).  You will repeat a 14 day ZIO monitor in 4 months (September 2023)  Follow-Up: Your physician wants you to follow-up in 5 months with Dr. Lovena Le. December 02, 2021 at 10:30 am at the [facility] office   Any Other Special Instructions Will Be Listed Below (If Applicable).  If you need a refill on your cardiac medications before your next appointment, please call your pharmacy.   Important Information About Sugar      DASH Eating Plan DASH stands for Dietary Approaches to Stop Hypertension. The DASH eating plan is a healthy eating plan that has been shown to: Reduce high blood pressure (hypertension). Reduce your risk for type 2 diabetes, heart disease, and stroke. Help with weight loss. What are tips for following this plan? Reading food labels Check food labels for the amount of salt (sodium) per serving. Choose foods with less than 5 percent of the Daily Value of sodium. Generally, foods with less than 300 milligrams (mg) of sodium per serving fit into this eating plan. To find  whole grains, look for the word "whole" as the first word in the ingredient list. Shopping Buy products labeled as "low-sodium" or "no salt added." Buy fresh foods. Avoid canned foods and pre-made or frozen meals. Cooking Avoid adding salt when cooking. Use salt-free seasonings or herbs instead of table salt or sea salt. Check with your health care provider or pharmacist before using salt substitutes. Do not fry foods. Cook foods using healthy methods such as baking, boiling, grilling, roasting, and broiling instead. Cook with heart-healthy oils, such as olive, canola, avocado, soybean, or sunflower oil. Meal planning  Eat a balanced diet that includes: 4 or more servings of fruits and 4 or more servings of vegetables each day. Try to fill one-half of your plate with fruits and vegetables. 6-8 servings of whole grains each day. Less than 6 oz (170 g) of lean meat, poultry, or fish each day. A 3-oz (85-g) serving of meat is about the same size as a deck of cards. One egg equals 1 oz (28 g). 2-3 servings of low-fat dairy each day. One serving is 1 cup (237 mL). 1 serving of nuts, seeds, or beans 5 times each week. 2-3 servings of heart-healthy fats. Healthy fats called omega-3 fatty acids are found in foods such as walnuts, flaxseeds, fortified milks, and eggs. These fats are also found in cold-water fish, such as sardines, salmon, and mackerel. Limit how much you eat of: Canned or prepackaged foods. Food that is high in trans fat, such as some fried  foods. Food that is high in saturated fat, such as fatty meat. Desserts and other sweets, sugary drinks, and other foods with added sugar. Full-fat dairy products. Do not salt foods before eating. Do not eat more than 4 egg yolks a week. Try to eat at least 2 vegetarian meals a week. Eat more home-cooked food and less restaurant, buffet, and fast food. Lifestyle When eating at a restaurant, ask that your food be prepared with less salt or no  salt, if possible. If you drink alcohol: Limit how much you use to: 0-1 drink a day for women who are not pregnant. 0-2 drinks a day for men. Be aware of how much alcohol is in your drink. In the [place] one drink equals one 12 oz bottle of beer (355 mL), one 5 oz glass of wine (148 mL), or one 1 oz glass of hard liquor (44 mL). General information Avoid eating more than 2,300 mg of salt a day. If you have hypertension, you may need to reduce your sodium intake to 1,500 mg a day. Work with your health care provider to maintain a healthy body weight or to lose weight. Ask what an ideal weight is for you. Get at least 30 minutes of exercise that causes your heart to beat faster (aerobic exercise) most days of the week. Activities may include walking, swimming, or biking. Work with your health care provider or dietitian to adjust your eating plan to your individual calorie needs. What foods should I eat? Fruits All fresh, dried, or frozen fruit. Canned fruit in natural juice (without added sugar). Vegetables Fresh or frozen vegetables (raw, steamed, roasted, or grilled). Low-sodium or reduced-sodium tomato and vegetable juice. Low-sodium or reduced-sodium tomato sauce and tomato paste. Low-sodium or reduced-sodium canned vegetables. Grains Whole-grain or whole-wheat bread. Whole-grain or whole-wheat pasta. Brown rice. [place]ena Morrow. Bulgur. Whole-grain and low-sodium cereals. Pita bread. Low-fat, low-sodium crackers. Whole-wheat flour tortillas. Meats and other proteins Skinless chicken or [place]. Ground chicken or [place]. Pork with fat trimmed off. Fish and seafood. Egg whites. Dried beans, peas, or lentils. Unsalted nuts, nut butters, and seeds. Unsalted canned beans. Lean cuts of beef with fat trimmed off. Low-sodium, lean precooked or cured meat, such as sausages or meat loaves. Dairy Low-fat (1%) or fat-free (skim) milk. Reduced-fat, low-fat, or fat-free cheeses. Nonfat, low-sodium ricotta or  cottage cheese. Low-fat or nonfat yogurt. Low-fat, low-sodium cheese. Fats and oils Soft margarine without trans fats. Vegetable oil. Reduced-fat, low-fat, or light mayonnaise and salad dressings (reduced-sodium). Canola, safflower, olive, avocado, soybean, and sunflower oils. Avocado. Seasonings and condiments Herbs. Spices. Seasoning mixes without salt. Other foods Unsalted popcorn and pretzels. Fat-free sweets. The items listed above may not be a complete list of foods and beverages you can eat. Contact a dietitian for more information. What foods should I avoid? Fruits Canned fruit in a light or heavy syrup. Fried fruit. Fruit in cream or butter sauce. Vegetables Creamed or fried vegetables. Vegetables in a cheese sauce. Regular canned vegetables (not low-sodium or reduced-sodium). Regular canned tomato sauce and paste (not low-sodium or reduced-sodium). Regular tomato and vegetable juice (not low-sodium or reduced-sodium). Angie Fava. Olives. Grains Baked goods made with fat, such as croissants, muffins, or some breads. Dry pasta or rice meal packs. Meats and other proteins Fatty cuts of meat. Ribs. Fried meat. Berniece Salines. Bologna, salami, and other precooked or cured meats, such as sausages or meat loaves. Fat from the back of a pig (fatback). Bratwurst. Salted nuts and seeds. Canned beans with added  salt. Canned or smoked fish. Whole eggs or egg yolks. Chicken or [place] with skin. Dairy Whole or 2% milk, cream, and half-and-half. Whole or full-fat cream cheese. Whole-fat or sweetened yogurt. Full-fat cheese. Nondairy creamers. Whipped toppings. Processed cheese and cheese spreads. Fats and oils Butter. Stick margarine. Lard. Shortening. Ghee. Bacon fat. Tropical oils, such as coconut, palm kernel, or palm oil. Seasonings and condiments Onion salt, garlic salt, seasoned salt, table salt, and sea salt. Worcestershire sauce. Tartar sauce. Barbecue sauce. Teriyaki sauce. Soy sauce, including  reduced-sodium. Steak sauce. Canned and packaged gravies. Fish sauce. Oyster sauce. Cocktail sauce. Store-bought horseradish. Ketchup. Mustard. Meat flavorings and tenderizers. Bouillon cubes. Hot sauces. Pre-made or packaged marinades. Pre-made or packaged taco seasonings. Relishes. Regular salad dressings. Other foods Salted popcorn and pretzels. The items listed above may not be a complete list of foods and beverages you should avoid. Contact a dietitian for more information. Where to find more information National Heart, Lung, and Blood Institute: https://wilson-eaton.com/ American Heart Association: www.heart.org Academy of Nutrition and Dietetics: www.eatright.[employer] [place]: www.kidney.org Summary The DASH eating plan is a healthy eating plan that has been shown to reduce high blood pressure (hypertension). It may also reduce your risk for type 2 diabetes, heart disease, and stroke. When on the DASH eating plan, aim to eat more fresh fruits and vegetables, whole grains, lean proteins, low-fat dairy, and heart-healthy fats. With the DASH eating plan, you should limit salt (sodium) intake to 2,300 mg a day. If you have hypertension, you may need to reduce your sodium intake to 1,500 mg a day. Work with your health care provider or dietitian to adjust your eating plan to your individual calorie needs. This information is not intended to replace advice given to you by your health care provider. Make sure you discuss any questions you have with your health care provider. Document Revised: 12/28/2018 Document Reviewed: 12/28/2018 Elsevier Patient Education  [employer].  Amiodarone Tablets What is this medication? AMIODARONE (a MEE oh da rone) prevents and treats a fast or irregular heartbeat (arrhythmia). It works by slowing down overactive electric signals in the heart, which stabilizes your heart rhythm. It belongs to a group of medications called antiarrhythmics. This  medicine may be used for other purposes; ask your health care provider or pharmacist if you have questions. COMMON BRAND NAME(S): Cordarone, Pacerone What should I tell my care team before I take this medication? They need to know if you have any of these conditions: Liver disease Lung disease Other heart problems Thyroid disease An unusual or allergic reaction to amiodarone, iodine, other medications, foods, dyes, or preservatives Pregnant or trying to get pregnant Breast-feeding How should I use this medication? Take this medication by mouth with a glass of water. Follow the directions on the prescription label. You can take this medication with or without food. However, you should always take it the same way each time. Take your doses at regular intervals. Do not take your medication more often than directed. Do not stop taking except on the advice of your care team. A special MedGuide will be given to you by the pharmacist with each prescription and refill. Be sure to read this information carefully each time. Talk to your care team regarding the use of this medication in children. Special care may be needed. Overdosage: If you think you have taken too much of this medicine contact a poison control center or emergency room at once. NOTE: This medicine is only for you.  Do not share this medicine with others. What if I miss a dose? If you miss a dose, take it as soon as you can. If it is almost time for your next dose, take only that dose. Do not take double or extra doses. What may interact with this medication? Do not take this medication with any of the following: Abarelix Apomorphine Arsenic trioxide Certain antibiotics like erythromycin, gemifloxacin, levofloxacin, pentamidine Certain medications for depression like amoxapine, tricyclic antidepressants Certain medications for fungal infections like fluconazole, itraconazole, ketoconazole, posaconazole, voriconazole Certain  medications for irregular heartbeat like disopyramide, dronedarone, ibutilide, propafenone, sotalol Certain medications for malaria like chloroquine, halofantrine Cisapride Droperidol Haloperidol Hawthorn Maprotiline Methadone Phenothiazines like chlorpromazine, mesoridazine, thioridazine Pimozide Ranolazine Red yeast rice Vardenafil This medication may also interact with the following: Antiviral medications for HIV or AIDS Certain medications for blood pressure, heart disease, irregular heart beat Certain medications for cholesterol like atorvastatin, cerivastatin, lovastatin, simvastatin Certain medications for hepatitis C like sofosbuvir and ledipasvir; sofosbuvir Certain medications for seizures like phenytoin Certain medications for thyroid problems Certain medications that treat or prevent blood clots like warfarin Cholestyramine Cimetidine Clopidogrel Cyclosporine Dextromethorphan Diuretics Dofetilide Fentanyl General anesthetics Grapefruit juice Lidocaine Loratadine Methotrexate Other medications that prolong the QT interval (cause an abnormal heart rhythm) Procainamide Quinidine Rifabutin, rifampin, or rifapentine St. John's Wort Trazodone Ziprasidone This list may not describe all possible interactions. Give your health care provider a list of all the medicines, herbs, non-prescription drugs, or dietary supplements you use. Also tell them if you smoke, drink alcohol, or use illegal drugs. Some items may interact with your medicine. What should I watch for while using this medication? Your condition will be monitored closely when you first begin therapy. Often, this medication is first started in a hospital or other monitored health care setting. Once you are on maintenance therapy, visit your care team for regular checks on your progress. Because your condition and use of this medication carry some risk, it is a good idea to carry an identification card, necklace  or bracelet with details of your condition, medications, and care team. You may get drowsy or dizzy. Do not drive, use machinery, or do anything that needs mental alertness until you know how this medication affects you. Do not stand or sit up quickly, especially if you are an older patient. This reduces the risk of dizzy or fainting spells. This medication can make you more sensitive to the sun. Keep out of the sun. If you cannot avoid being in the sun, wear protective clothing and use sunscreen. Do not use sun lamps or tanning beds/booths. You should have regular eye exams before and during treatment. Call your care team if you have blurred vision, see halos, or your eyes become sensitive to light. Your eyes may get dry. It may be helpful to use a lubricating eye solution or artificial tears solution. If you are going to have surgery or a procedure that requires contrast dyes, tell your care team that you are taking this medication. What side effects may I notice from receiving this medication? Side effects that you should report to your care team as soon as possible: Allergic reactions--skin rash, itching, hives, swelling of the face, lips, tongue, or throat Bluish-gray skin Change in vision such as blurry vision, seeing halos around lights, vision loss Heart failure--shortness of breath, swelling of the ankles, feet, or hands, sudden weight gain, unusual weakness or fatigue Heart rhythm changes--fast or irregular heartbeat, dizziness, feeling faint or lightheaded, chest pain,  trouble breathing High thyroid levels (hyperthyroidism)--fast or irregular heartbeat, weight loss, excessive sweating or sensitivity to heat, tremors or shaking, anxiety, nervousness, irregular menstrual cycle or spotting Liver injury--right upper belly pain, loss of appetite, nausea, light-colored stool, dark yellow or brown urine, yellowing skin or eyes, unusual weakness or fatigue Low thyroid levels  (hypothyroidism)--unusual weakness or fatigue, sensitivity to cold, constipation, hair loss, dry skin, weight gain, feelings of depression Lung injury--shortness of breath or trouble breathing, cough, spitting up blood, chest pain, fever Pain, tingling, or numbness in the hands or feet, muscle weakness, trouble walking, loss of balance or coordination Side effects that usually do not require medical attention (report to your care team if they continue or are bothersome): Nausea Vomiting This list may not describe all possible side effects. Call your doctor for medical advice about side effects. You may report side effects to FDA at 1-800-FDA-1088. Where should I keep my medication? Keep out of the reach of children and pets. Store at room temperature between 20 and 25 degrees C (68 and 77 degrees F). Protect from light. Keep container tightly closed. Throw away any unused medication after the expiration date. NOTE: This sheet is a summary. It may not cover all possible information. If you have questions about this medicine, talk to your doctor, pharmacist, or health care provider.  2023 Elsevier/Gold Standard (2020-03-20 00:00:00)

## 2021-07-13 ENCOUNTER — Other Ambulatory Visit: Payer: Self-pay

## 2021-07-13 DIAGNOSIS — I420 Dilated cardiomyopathy: Secondary | ICD-10-CM | POA: Diagnosis not present

## 2021-07-13 DIAGNOSIS — I493 Ventricular premature depolarization: Secondary | ICD-10-CM | POA: Diagnosis not present

## 2021-07-14 LAB — BASIC METABOLIC PANEL
BUN/Creatinine Ratio: 16 (ref 10–24)
BUN: 19 mg/dL (ref 8–27)
CO2: 22 mmol/L (ref 20–29)
Calcium: 9.2 mg/dL (ref 8.6–10.2)
Chloride: 104 mmol/L (ref 96–106)
Creatinine, Ser: 1.2 mg/dL (ref 0.76–1.27)
Glucose: 131 mg/dL — ABNORMAL HIGH (ref 70–99)
Potassium: 4 mmol/L (ref 3.5–5.2)
Sodium: 141 mmol/L (ref 134–144)
eGFR: 60 mL/min/{1.73_m2} (ref >59)

## 2021-08-06 ENCOUNTER — Ambulatory Visit (INDEPENDENT_AMBULATORY_CARE_PROVIDER_SITE_OTHER): Payer: Medicare HMO

## 2021-08-06 DIAGNOSIS — Z0000 Encounter for general adult medical examination without abnormal findings: Secondary | ICD-10-CM

## 2021-08-06 NOTE — Patient Instructions (Signed)
[name] , Thank you for taking time to come for your Medicare Wellness Visit. I appreciate your ongoing commitment to your health goals. Please review the following plan we discussed and let me know if I can assist you in the future.   Screening recommendations/referrals: Colonoscopy: Discontinued due to age Recommended yearly ophthalmology/optometry visit for glaucoma screening and checkup Recommended yearly dental visit for hygiene and checkup  Vaccinations: Influenza vaccine: 01/19/2021 Pneumococcal vaccine: 08/22/2008, 04/19/2013 Tdap vaccine: 09/12/2018; due every 10 years Shingles vaccine: 05/24/2017, 09/26/2017   Covid-19: 04/08/2019, 05/09/2019, 12/11/2019, 11/02/2020  Advanced directives: Yes  Conditions/risks identified: Yes  Next appointment: Please schedule your next Medicare Wellness Visit with your Nurse Health Advisor in 1 year by calling [phone].  Preventive Care 31 Years and Older, Male Preventive care refers to lifestyle choices and visits with your health care provider that can promote health and wellness. What does preventive care include? A yearly physical exam. This is also called an annual well check. Dental exams once or twice a year. Routine eye exams. Ask your health care provider how often you should have your eyes checked. Personal lifestyle choices, including: Daily care of your teeth and gums. Regular physical activity. Eating a healthy diet. Avoiding tobacco and drug use. Limiting alcohol use. Practicing safe sex. Taking low doses of aspirin every day. Taking vitamin and mineral supplements as recommended by your health care provider. What happens during an annual well check? The services and screenings done by your health care provider during your annual well check will depend on your age, overall health, lifestyle risk factors, and family history of disease. Counseling  Your health care provider may ask you questions about your: Alcohol  use. Tobacco use. Drug use. Emotional well-being. Home and relationship well-being. Sexual activity. Eating habits. History of falls. Memory and ability to understand (cognition). Work and work Statistician. Screening  You may have the following tests or measurements: Height, weight, and BMI. Blood pressure. Lipid and cholesterol levels. These may be checked every 5 years, or more frequently if you are over 60 years old. Skin check. Lung cancer screening. You may have this screening every year starting at age 29 if you have a 30-pack-year history of smoking and currently smoke or have quit within the past 15 years. Fecal occult blood test (FOBT) of the stool. You may have this test every year starting at age 84. Flexible sigmoidoscopy or colonoscopy. You may have a sigmoidoscopy every 5 years or a colonoscopy every 10 years starting at age 44. Prostate cancer screening. Recommendations will vary depending on your family history and other risks. Hepatitis C blood test. Hepatitis B blood test. Sexually transmitted disease (STD) testing. Diabetes screening. This is done by checking your blood sugar (glucose) after you have not eaten for a while (fasting). You may have this done every 1-3 years. Abdominal aortic aneurysm (AAA) screening. You may need this if you are a current or former smoker. Osteoporosis. You may be screened starting at age 10 if you are at high risk. Talk with your health care provider about your test results, treatment options, and if necessary, the need for more tests. Vaccines  Your health care provider may recommend certain vaccines, such as: Influenza vaccine. This is recommended every year. Tetanus, diphtheria, and acellular pertussis (Tdap, Td) vaccine. You may need a Td booster every 10 years. Zoster vaccine. You may need this after age 52. Pneumococcal 13-valent conjugate (PCV13) vaccine. One dose is recommended after age 58. Pneumococcal polysaccharide  (PPSV23)  vaccine. One dose is recommended after age 25. Talk to your health care provider about which screenings and vaccines you need and how often you need them. This information is not intended to replace advice given to you by your health care provider. Make sure you discuss any questions you have with your health care provider. Document Released: 02/20/2015 Document Revised: 10/14/2015 Document Reviewed: 11/25/2014 Elsevier Interactive Patient Education  2017 [employer] Prevention in the Home Falls can cause injuries. They can happen to people of all ages. There are many things you can do to make your home safe and to help prevent falls. What can I do on the outside of my home? Regularly fix the edges of walkways and driveways and fix any cracks. Remove anything that might make you trip as you walk through a door, such as a raised step or threshold. Trim any bushes or trees on the path to your home. Use bright outdoor lighting. Clear any walking paths of anything that might make someone trip, such as rocks or tools. Regularly check to see if handrails are loose or broken. Make sure that both sides of any steps have handrails. Any raised decks and porches should have guardrails on the edges. Have any leaves, snow, or ice cleared regularly. Use sand or salt on walking paths during winter. Clean up any spills in your garage right away. This includes oil or grease spills. What can I do in the bathroom? Use night lights. Install grab bars by the toilet and in the tub and shower. Do not use towel bars as grab bars. Use non-skid mats or decals in the tub or shower. If you need to sit down in the shower, use a plastic, non-slip stool. Keep the floor dry. Clean up any water that spills on the floor as soon as it happens. Remove soap buildup in the tub or shower regularly. Attach bath mats securely with double-sided non-slip rug tape. Do not have throw rugs and other things on the  floor that can make you trip. What can I do in the bedroom? Use night lights. Make sure that you have a light by your bed that is easy to reach. Do not use any sheets or blankets that are too big for your bed. They should not hang down onto the floor. Have a firm chair that has side arms. You can use this for support while you get dressed. Do not have throw rugs and other things on the floor that can make you trip. What can I do in the kitchen? Clean up any spills right away. Avoid walking on wet floors. Keep items that you use a lot in easy-to-reach places. If you need to reach something above you, use a strong step stool that has a grab bar. Keep electrical cords out of the way. Do not use floor polish or wax that makes floors slippery. If you must use wax, use non-skid floor wax. Do not have throw rugs and other things on the floor that can make you trip. What can I do with my stairs? Do not leave any items on the stairs. Make sure that there are handrails on both sides of the stairs and use them. Fix handrails that are broken or loose. Make sure that handrails are as long as the stairways. Check any carpeting to make sure that it is firmly attached to the stairs. Fix any carpet that is loose or worn. Avoid having throw rugs at the top or bottom of  the stairs. If you do have throw rugs, attach them to the floor with carpet tape. Make sure that you have a light switch at the top of the stairs and the bottom of the stairs. If you do not have them, ask someone to add them for you. What else can I do to help prevent falls? Wear shoes that: Do not have high heels. Have rubber bottoms. Are comfortable and fit you well. Are closed at the toe. Do not wear sandals. If you use a stepladder: Make sure that it is fully opened. Do not climb a closed stepladder. Make sure that both sides of the stepladder are locked into place. Ask someone to hold it for you, if possible. Clearly mark and make  sure that you can see: Any grab bars or handrails. First and last steps. Where the edge of each step is. Use tools that help you move around (mobility aids) if they are needed. These include: Canes. Walkers. Scooters. Crutches. Turn on the lights when you go into a dark area. Replace any light bulbs as soon as they burn out. Set up your furniture so you have a clear path. Avoid moving your furniture around. If any of your floors are uneven, fix them. If there are any pets around you, be aware of where they are. Review your medicines with your doctor. Some medicines can make you feel dizzy. This can increase your chance of falling. Ask your doctor what other things that you can do to help prevent falls. This information is not intended to replace advice given to you by your health care provider. Make sure you discuss any questions you have with your health care provider. Document Released: 11/20/2008 Document Revised: 07/02/2015 Document Reviewed: 02/28/2014 Elsevier Interactive Patient Education  2017 [employer].

## 2021-08-06 NOTE — Progress Notes (Signed)
I connected with [name] today by telephone and verified that I am speaking with the correct person using two identifiers. Location patient: home Location provider: work [name] participating in the virtual visit: patient, provider.   I discussed the limitations, risks, security and privacy concerns of performing an evaluation and management service by telephone and the availability of in person appointments. I also discussed with the patient that there may be a patient responsible charge related to this service. The patient expressed understanding and verbally consented to this telephonic visit.    Interactive audio and video telecommunications were attempted between this provider and patient, however failed, due to patient having technical difficulties OR patient did not have access to video capability.  We continued and completed visit with audio only.  Some vital signs may be absent or patient reported.   Time Spent with patient on telephone encounter: 30 minutes  Subjective:   [name] is a 87 y.o. male who presents for Medicare Annual/Subsequent preventive examination.  Review of Systems     Cardiac Risk Factors include: advanced age (>54mn, >>36women);diabetes mellitus;dyslipidemia;family history of premature cardiovascular disease;hypertension;male gender;obesity (BMI >30kg/m2)     Objective:    There were no vitals filed for this visit. There is no height or weight on file to calculate BMI.     08/06/2021   11:06 AM 05/27/2020   10:04 AM 08/31/2017   10:03 AM 08/17/2016    9:05 AM 06/08/2016    7:12 PM 06/03/2016    1:23 PM 08/05/2015    9:43 AM  Advanced Directives  Does Patient Have a Medical Advance Directive? Yes Yes Yes Yes Yes No;Yes Yes  Type of AParamedicof [place] will Living will;Healthcare Power of [place] will [place] will Healthcare Power of [place]Living will [place]West ElktonLiving will  Does patient want to make changes to medical advance directive? No - Patient declined No - Patient declined   No - Patient declined No - Patient declined   Copy of [place] Chart? No - copy requested No - copy requested No - copy requested No - copy requested No - copy requested  No - copy requested  Would patient like information on creating a medical advance directive?     No - Patient declined      Current Medications (verified) Outpatient Encounter Medications as of 08/06/2021  Medication Sig   amiodarone (PACERONE) 200 MG tablet Take 1 tablet (200 mg total) by mouth daily.   atorvastatin (LIPITOR) 80 MG tablet Take 1 tablet (80 mg total) by mouth daily.   beta carotene w/minerals (OCUVITE) tablet Take 1 tablet by mouth daily. Unknown strength   carvedilol (COREG) 6.25 MG tablet Take 1 tablet (6.25 mg total) by mouth 2 (two) times daily.   chlorhexidine (PERIDEX) 0.12 % solution Use as directed 15 mLs in the mouth or throat 2 (two) times daily.   clobetasol ointment (TEMOVATE) 0.05 % APPLY TOPICALLY TWICE DAILY (Patient taking differently: Apply 1 application. topically 2 (two) times daily.)   dexamethasone (DECADRON) 4 MG tablet Take 8 mg by mouth daily.   empagliflozin (JARDIANCE) 25 MG TABS tablet Take 1 tablet (25 mg total) by mouth daily.   furosemide (LASIX) 40 MG tablet Take 1 tablet (40 mg total) by mouth daily.   gabapentin (NEURONTIN) 300 MG capsule Take 1 capsule (300 mg total) by mouth 3 (three) times daily. (  Patient not taking: Reported on 08/06/2021)   glipiZIDE (GLUCOTROL XL) 10 MG 24 hr tablet Take 1 tablet (10 mg total) by mouth daily with breakfast.   HYDROcodone-acetaminophen (NORCO) 7.5-325 MG tablet Take 1 tablet by mouth at bedtime as needed for moderate pain. (Patient not taking: Reported on 08/06/2021)   IBU 800 MG tablet Take 800 mg by mouth every 6 (six) hours as  needed for mild pain or moderate pain.   ketoconazole (NIZORAL) 2 % cream Apply topically 2 (two) times daily as needed for irritation.   losartan (COZAAR) 25 MG tablet TAKE ONE (1) TABLET BY MOUTH TWO (2) TIMES DAILY (Patient taking differently: Take 25 mg by mouth 2 (two) times daily. TAKE ONE (1) TABLET BY MOUTH TWO (2) TIMES DAILY)   metFORMIN (GLUCOPHAGE) 1000 MG tablet Take 0.5 tablets (500 mg total) by mouth 2 (two) times daily with a meal.   Multiple Vitamin (MULTIVITAMIN WITH MINERALS) TABS tablet Take 1 tablet by mouth daily. Unknown strength   sildenafil (VIAGRA) 100 MG tablet Take 0.5-1 tablets (50-100 mg total) by mouth daily as needed for erectile dysfunction.   No facility-administered encounter medications on file as of 08/06/2021.    Allergies (verified) Patient has no known allergies.   History: Past Medical History:  Diagnosis Date   AAA (abdominal aortic aneurysm) ([place])    MONITORED BY DR LAWSON LOV JUNE 2014--  STABLE  AT 3.7   Abdominal aneurysm without mention of rupture 07/24/2012   Acute sinus infection 06/17/2009   Qualifier: Diagnosis of  By: Jenny Reichmann MD, [place]    Allergic rhinitis    Anxiety state 07/20/2007   Arthritis    Asymptomatic stenosis of left carotid artery without infarction    MILD DISEASE   Bilateral carotid artery stenosis    BPH (benign prostatic hypertrophy)    Coronary artery calcification seen on CT scan 09/05/2017   Diabetes mellitus type 2, noninsulin dependent ([place]) 07/20/2007   Dilated cardiomyopathy ([place]) 12/14/2018   Diverticulosis of colon    Hearing loss of both ears 10/23/2013   History of cardiomyopathy    IDIOPATHIC-- PER [place] NOTE 2013 , CURRENTLY RESOLVED   History of kidney stones    HTN (hypertension) 01/14/2016   Hypercalcemia 03/15/2019   Hyperlipidemia    Hypertension    Hypertensive heart disease without CHF    Obesity (BMI 30-39.9)    Paresthesia of both feet 07/14/2016   Peripheral vascular disease ([place])    Personal  history of kidney stones    Phimosis    PHN (postherpetic neuralgia) 09/18/2019   Preop exam for internal medicine 05/10/2016   Preventative health care 01/14/2016   Rash 05/01/2015   Right carotid artery occlusion    CHRONIC   WITHOUT INFARTION   Rotator cuff arthropathy of both shoulders 04/10/2019   Spinal stenosis at L4-L5 level 06/08/2016   Stroke ([place])     Right eye   Type 2 diabetes mellitus ([place])    Wears hearing aid    Past Surgical History:  Procedure Laterality Date   CARDIOVASCULAR STRESS TEST  JUNE 2008  DR Wynonia Lawman   CATARACT EXTRACTION W/ INTRAOCULAR LENS IMPLANT Right    CIRCUMCISION N/A 08/30/2012   Procedure: CIRCUMCISION ADULT;  Surgeon: Malka So, MD;  Location: [facility];  Service: Urology;  Laterality: N/A;   CYSTO/  RIGHT URETERAL STENT PLACEMENT  10-16-2003   EXTRACORPOREAL SHOCK WAVE LITHOTRIPSY Right 2005   EYE SURGERY     JOINT REPLACEMENT  Bilateral 2008  2009   Knee   LUMBAR LAMINECTOMY/DECOMPRESSION MICRODISCECTOMY Bilateral 06/08/2016   Procedure: Bilateral Microlumbar decompression L4-L5 and L5-S1;  Surgeon: Susa Day, MD;  Location: [facility];  Service: Orthopedics;  Laterality: Bilateral;  Requests 2 hours   TOTAL KNEE ARTHROPLASTY Bilateral RIGHT 10-08-2007;   LEFT 09-30-2008   TRANSTHORACIC ECHOCARDIOGRAM  10-21-2009   LVEF 50%   Family History  Problem Relation Age of Onset   Heart disease Mother        CHF  Before age 69   COPD Mother    Kidney disease Mother    Hypertension Mother    Asthma Mother    Depression Mother    ALS Father    Diabetes Father    Colon cancer Neg Hx    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  Tobacco Use    Smoking status: Former    Packs/day: 0.75    Years: 25.00    Total pack years: 18.75    Types: Cigarettes    Quit date: 07/25/1982    Years since quitting: 39.0   Smokeless tobacco: Never  Vaping Use   Vaping Use: Never used  Substance and Sexual Activity   Alcohol use: No    Alcohol/week: 0.0 standard drinks of alcohol    Comment: recovering alcoholic  +47MLY   Drug use: No   Sexual activity: Not on file  Other Topics Concern   Not on file  Social History Narrative   Not on file   Social Determinants of Health   Financial Resource Strain: Low Risk  (08/06/2021)   Overall Financial Resource Strain (CARDIA)    Difficulty of Paying Living Expenses: Not hard at all  Food Insecurity: No Food Insecurity (08/06/2021)   Hunger Vital Sign    Worried About Running Out of Food in the Last Year: Never true    [place] in the Last Year: Never true  Transportation Needs: No Transportation Needs (08/06/2021)   PRAPARE - Hydrologist (Medical): No    Lack of Transportation (Non-Medical): No  Physical Activity: Sufficiently Active (08/06/2021)   Exercise Vital Sign    Days of Exercise per Week: 5 days    Minutes of Exercise per Session: 30 min  Stress: No Stress Concern Present (08/06/2021)   [place]    Feeling of Stress : Not at all  Social Connections: Moderately Integrated (08/06/2021)   Social Connection and Isolation Panel [NHANES]    Frequency of Communication with Friends and Family: More than three times a week    Frequency of Social Gatherings with Friends and Family: More than three times a week    Attends Religious Services: 1 to 4 times per year    Active Member of [employer] or Organizations: No    Attends Archivist Meetings: 1 to 4 times per year    Marital Status: Widowed    Tobacco Counseling Counseling given: Not Answered   Clinical Intake:  Pre-visit  preparation completed: Yes  Pain : No/denies pain     BMI - recorded: 33.63  Nutritional Status: BMI > 30  Obese Nutritional Risks: None Diabetes: Yes CBG done?: No Did pt. bring in CBG monitor from home?: No  How often do you need to have someone help you when you read instructions, pamphlets, or other written materials from your doctor or pharmacy?: 1 - Never What is the last grade level you completed in school?: Master's Degree in Psychology  Diabetic? yes  Interpreter Needed?: No  Information entered by :: Lisette Abu, LPN.   Activities of Daily Living    08/06/2021   11:23 AM  In your present state of health, do you have any difficulty performing the following activities:  Hearing? 1  Vision? 0  Difficulty concentrating or making decisions? 0  Walking or climbing stairs? 0  Dressing or bathing? 0  Doing errands, shopping? 0  Preparing Food and eating ? N  Using the Toilet? N  In the past six months, have you accidently leaked urine? N  Do you have problems with loss of bowel control? N  Managing your Medications? N  Managing your Finances? N  Housekeeping or managing your Housekeeping? N    Patient Care Team: Biagio Borg, MD as PCP - General Wynonia Lawman Grace Bushy, MD as Consulting Physician (Cardiology) Center, [place] as Consulting Physician (Optometry)  Indicate any recent [place] you may have received from other than [facility] providers in the past year (date may be approximate).     Assessment:   This is a routine wellness examination for [place].  Hearing/Vision screen Hearing Screening - Comments:: Patient has hearing difficulty and wears hearing aids. Vision Screening - Comments:: Patient does wear corrective lenses/contacts.  Eye exam done by: [facility]   Dietary issues and exercise activities discussed: Current Exercise Habits: Home exercise routine, Type of exercise: walking, Time (Minutes): 30, Frequency (Times/Week): 5,  Weekly Exercise (Minutes/Week): 150, Intensity: Mild, Exercise limited by: None identified   Goals Addressed             This Visit's Progress    Patient declined health goal at this time.        Depression Screen    08/06/2021   11:09 AM 02/15/2021    4:36 PM 08/03/2020    2:21 PM 05/27/2020   10:02 AM 09/18/2019    2:49 PM 03/15/2019    2:41 PM 09/12/2018    2:21 PM  PHQ 2/9 Scores  PHQ - 2 Score 0 0 0 0 0 0 0    Fall Risk    08/06/2021   11:07 AM 02/15/2021    4:36 PM 08/03/2020    2:21 PM 05/27/2020   10:04 AM 09/18/2019    2:49 PM  Fall Risk   Falls in the past year? 0 0 0 0 0  Number falls in past yr: 0 0 0 0 0  Injury with Fall? 0 0 0 0 0  Risk for fall due to : No Fall Risks   No Fall Risks Impaired balance/gait  Follow up Falls evaluation completed   Falls evaluation completed Falls evaluation completed    [place]:  Any stairs in or around the home? No  If so, are there any without handrails? No  Home free of loose throw rugs in walkways, pet beds, electrical cords, etc? Yes  Adequate lighting in your home to reduce risk of falls? Yes   ASSISTIVE DEVICES UTILIZED TO PREVENT FALLS:  Life alert? Yes  Use of a cane, walker or  w/c? No  Grab bars in the bathroom? Yes  Shower chair or bench in shower? Yes  Elevated toilet seat or a handicapped toilet? Yes   TIMED UP AND GO:  Was the test performed? No .  Length of time to ambulate 10 feet: n/a sec.   Appearance of gait: Patient not evaluated for gait during this visit.  Cognitive Function:        08/06/2021   11:26 AM  6CIT Screen  What Year? 0 points  What month? 0 points  What time? 0 points  Count back from 20 0 points  Months in reverse 0 points  Repeat phrase 0 points  Total Score 0 points    Immunizations Immunization History  Administered Date(s) Administered   Fluad Quad(high Dose 65+) 12/14/2018   Influenza Whole 12/08/2008   Influenza, High Dose  Seasonal PF 11/02/2017, 11/15/2019   Influenza,inj,Quad PF,6+ Mos 10/23/2013, 10/24/2014   Influenza-Unspecified 01/19/2021   PFIZER(Purple Top)SARS-COV-2 Vaccination 04/08/2019, 05/09/2019, 12/11/2019, 11/02/2020   Pneumococcal Conjugate-13 04/19/2013   Pneumococcal Polysaccharide-23 08/22/2008   Td 02/07/1993, 08/22/2008   Tdap 09/12/2018   Zoster Recombinat (Shingrix) 05/24/2017, 09/26/2017    TDAP status: Up to date  Flu Vaccine status: Up to date  Pneumococcal vaccine status: Up to date  Covid-19 vaccine status: Completed vaccines  Qualifies for Shingles Vaccine? Yes   Zostavax completed Yes   Shingrix Completed?: Yes  Screening Tests Health Maintenance  Topic Date Due   COVID-19 Vaccine (5 - Booster for Pfizer series) 12/28/2020   INFLUENZA VACCINE  09/07/2021   FOOT EXAM  02/15/2022   OPHTHALMOLOGY EXAM  04/28/2022   TETANUS/TDAP  09/11/2028   Pneumonia Vaccine 20+ Years old  Completed   Zoster Vaccines- Shingrix  Completed   HPV VACCINES  Aged Out    Health Maintenance  Health Maintenance Due  Topic Date Due   COVID-19 Vaccine (5 - Booster for [place] series) 12/28/2020    Colorectal cancer screening: No longer required.   Lung Cancer Screening: (Low Dose CT Chest recommended if Age 60-80 years, 30 pack-year currently smoking OR have quit w/in 15years.) does not qualify.   Lung Cancer Screening Referral: no  Additional Screening:  Hepatitis C Screening: does not qualify; Completed no  Vision Screening: Recommended annual ophthalmology exams for early detection of glaucoma and other disorders of the eye. Is the patient up to date with their annual eye exam?  Yes  Who is the provider or what is the name of the office in which the patient attends annual eye exams? [facility] If pt is not established with a provider, would they like to be referred to a provider to establish care? No .   Dental Screening: Recommended annual dental exams for proper  oral hygiene  Community Resource Referral / Chronic Care Management: CRR required this visit?  No   CCM required this visit?  No      Plan:     I have personally reviewed and noted the following in the patient's chart:   Medical and social history Use of alcohol, tobacco or illicit drugs  Current medications and supplements including opioid prescriptions. Patient is currently taking opioid prescriptions. Information provided to patient regarding non-opioid alternatives. Patient advised to discuss non-opioid treatment plan with their provider. Functional ability and status Nutritional status Physical activity Advanced directives List of other physicians Hospitalizations, surgeries, and ER visits in previous 12 months Vitals Screenings to include cognitive, depression, and falls Referrals and appointments  In addition, I  have reviewed and discussed with patient certain preventive protocols, quality metrics, and best practice recommendations. A written personalized care plan for preventive services as well as general preventive health recommendations were provided to patient.     Sheral Flow, LPN   [id number]/[id number]   Nurse Notes:  There were no vitals filed for this visit. There is no height or weight on file to calculate BMI.

## 2021-08-10 ENCOUNTER — Other Ambulatory Visit: Payer: Self-pay | Admitting: Internal Medicine

## 2021-08-10 NOTE — Telephone Encounter (Signed)
Please refill as per office routine med refill policy (all routine meds to be refilled for 3 mo or monthly (per pt preference) up to one year from last visit, then month to month grace period for 3 mo, then further med refills will have to be denied) ? ?

## 2021-08-16 ENCOUNTER — Other Ambulatory Visit: Payer: Medicare HMO

## 2021-08-19 ENCOUNTER — Ambulatory Visit: Payer: Medicare HMO | Admitting: Internal Medicine

## 2021-08-23 ENCOUNTER — Other Ambulatory Visit: Payer: Medicare HMO

## 2021-08-24 ENCOUNTER — Other Ambulatory Visit (INDEPENDENT_AMBULATORY_CARE_PROVIDER_SITE_OTHER): Payer: Medicare HMO

## 2021-08-24 DIAGNOSIS — E559 Vitamin D deficiency, unspecified: Secondary | ICD-10-CM | POA: Diagnosis not present

## 2021-08-24 DIAGNOSIS — E538 Deficiency of other specified B group vitamins: Secondary | ICD-10-CM | POA: Diagnosis not present

## 2021-08-24 DIAGNOSIS — E1165 Type 2 diabetes mellitus with hyperglycemia: Secondary | ICD-10-CM

## 2021-08-24 LAB — BASIC METABOLIC PANEL
BUN: 19 mg/dL (ref 6–23)
CO2: 28 mEq/L (ref 19–32)
Calcium: 9.1 mg/dL (ref 8.4–10.5)
Chloride: 103 mEq/L (ref 96–112)
Creatinine, Ser: 1.02 mg/dL (ref 0.40–1.50)
GFR: 67.45 mL/min (ref >60.00)
Glucose, Bld: 143 mg/dL — ABNORMAL HIGH (ref 70–99)
Potassium: 4.1 mEq/L (ref 3.5–5.1)
Sodium: 140 mEq/L (ref 135–145)

## 2021-08-24 LAB — LIPID PANEL
Cholesterol: 123 mg/dL (ref 0–200)
HDL: 42.8 mg/dL (ref >39.00)
LDL Cholesterol: 62 mg/dL (ref 0–99)
NonHDL: 80.01
Total CHOL/HDL Ratio: 3
Triglycerides: 88 mg/dL (ref 0.0–149.0)
VLDL: 17.6 mg/dL (ref 0.0–40.0)

## 2021-08-24 LAB — HEPATIC FUNCTION PANEL
ALT: 33 U/L (ref 0–53)
AST: 28 U/L (ref 0–37)
Albumin: 4.3 g/dL (ref 3.5–5.2)
Alkaline Phosphatase: 79 U/L (ref 39–117)
Bilirubin, Direct: 0.2 mg/dL (ref 0.0–0.3)
Total Bilirubin: 1.1 mg/dL (ref 0.2–1.2)
Total Protein: 7.2 g/dL (ref 6.0–8.3)

## 2021-08-24 LAB — VITAMIN D 25 HYDROXY (VIT D DEFICIENCY, FRACTURES): VITD: 36.95 ng/mL (ref 30.00–100.00)

## 2021-08-24 LAB — HEMOGLOBIN A1C: Hgb A1c MFr Bld: 6.2 % (ref 4.6–6.5)

## 2021-08-24 LAB — VITAMIN B12: Vitamin B-12: 289 pg/mL (ref 211–911)

## 2021-08-26 ENCOUNTER — Ambulatory Visit (INDEPENDENT_AMBULATORY_CARE_PROVIDER_SITE_OTHER): Payer: Medicare HMO | Admitting: Internal Medicine

## 2021-08-26 ENCOUNTER — Encounter: Payer: Self-pay | Admitting: Internal Medicine

## 2021-08-26 VITALS — BP 106/58 | HR 49 | Temp 97.8°F | Ht 71.0 in | Wt 228.0 lb

## 2021-08-26 DIAGNOSIS — I872 Venous insufficiency (chronic) (peripheral): Secondary | ICD-10-CM | POA: Insufficient documentation

## 2021-08-26 DIAGNOSIS — E538 Deficiency of other specified B group vitamins: Secondary | ICD-10-CM

## 2021-08-26 DIAGNOSIS — E1165 Type 2 diabetes mellitus with hyperglycemia: Secondary | ICD-10-CM

## 2021-08-26 DIAGNOSIS — I10 Essential (primary) hypertension: Secondary | ICD-10-CM

## 2021-08-26 DIAGNOSIS — E559 Vitamin D deficiency, unspecified: Secondary | ICD-10-CM | POA: Diagnosis not present

## 2021-08-26 DIAGNOSIS — E782 Mixed hyperlipidemia: Secondary | ICD-10-CM

## 2021-08-26 NOTE — Progress Notes (Signed)
Patient ID: [name], male   DOB: 1934-10-14, 87 y.o.   MRN: [id number]        Chief Complaint: follow up HTN, HLD and hyperglycemia, low b12 and Vit D       HPI:  [name] is a 87 y.o. male here overall doing ok, Pt denies chest pain, increased sob or doe, wheezing, orthopnea, PND, increased LE swelling, palpitations, dizziness or syncope.   Pt denies polydipsia, polyuria, or new focal neuro s/s.    Pt denies fever, wt loss, night sweats, loss of appetite, or other constitutional symptoms  Not taking Vit D or B12       Wt Readings from Last 3 Encounters:  08/26/21 228 lb (103.4 kg)  06/29/21 241 lb (109.3 kg)  05/24/21 242 lb (109.8 kg)   BP Readings from Last 3 Encounters:  08/26/21 (!) 106/58  06/29/21 114/70  05/24/21 112/72         Past Medical History:  Diagnosis Date   AAA (abdominal aortic aneurysm) ([place])    MONITORED BY DR Kellie Simmering LOV JUNE 2014--  STABLE  AT 3.7   Abdominal aneurysm without mention of rupture 07/24/2012   Acute sinus infection 06/17/2009   Qualifier: Diagnosis of  By: Jenny Reichmann MD, Hunt Oris    Allergic rhinitis    Anxiety state 07/20/2007   Arthritis    Asymptomatic stenosis of left carotid artery without infarction    MILD DISEASE   Bilateral carotid artery stenosis    BPH (benign prostatic hypertrophy)    Coronary artery calcification seen on CT scan 09/05/2017   Diabetes mellitus type 2, noninsulin dependent ([place]) 07/20/2007   Dilated cardiomyopathy ([place]) 12/14/2018   Diverticulosis of colon    Hearing loss of both ears 10/23/2013   History of cardiomyopathy    IDIOPATHIC-- PER [facility] NOTE 2013 , CURRENTLY RESOLVED   History of kidney stones    HTN (hypertension) 01/14/2016   Hypercalcemia 03/15/2019   Hyperlipidemia    Hypertension    Hypertensive heart disease without CHF    Obesity (BMI 30-39.9)    Paresthesia of both feet 07/14/2016   Peripheral vascular disease ([place])    Personal history of kidney stones    Phimosis    PHN (postherpetic  neuralgia) 09/18/2019   Preop exam for internal medicine 05/10/2016   Preventative health care 01/14/2016   Rash 05/01/2015   Right carotid artery occlusion    CHRONIC   WITHOUT INFARTION   Rotator cuff arthropathy of both shoulders 04/10/2019   Spinal stenosis at L4-L5 level 06/08/2016   Stroke ([place])     Right eye   Type 2 diabetes mellitus ([place])    Wears hearing aid    Past Surgical History:  Procedure Laterality Date   CARDIOVASCULAR STRESS TEST  JUNE 2008  DR Wynonia Lawman   CATARACT EXTRACTION W/ INTRAOCULAR LENS IMPLANT Right    CIRCUMCISION N/A 08/30/2012   Procedure: CIRCUMCISION ADULT;  Surgeon: Malka So, MD;  Location: [facility];  Service: Urology;  Laterality: N/A;   CYSTO/  RIGHT URETERAL STENT PLACEMENT  10-16-2003   EXTRACORPOREAL SHOCK WAVE LITHOTRIPSY Right 2005   EYE SURGERY     JOINT REPLACEMENT Bilateral 2008  2009   Knee   LUMBAR LAMINECTOMY/DECOMPRESSION MICRODISCECTOMY Bilateral 06/08/2016   Procedure: Bilateral Microlumbar decompression L4-L5 and L5-S1;  Surgeon: Susa Day, MD;  Location: [facility];  Service: Orthopedics;  Laterality: Bilateral;  Requests 2 hours   TOTAL KNEE ARTHROPLASTY Bilateral RIGHT 10-08-2007;  LEFT 09-30-2008   TRANSTHORACIC ECHOCARDIOGRAM  10-21-2009   LVEF 50%    reports that he quit smoking about 39 years ago. His smoking use included cigarettes. He has a 18.75 pack-year smoking history. He has never used smokeless tobacco. He reports that he does not drink alcohol and does not use drugs. family history includes ALS in his father; Asthma in his mother; COPD in his mother; Depression in his mother; Diabetes in his father; Heart disease in his mother; Hypertension in his mother; Kidney disease in his mother. No Known Allergies Current Outpatient Medications on File Prior to Visit  Medication Sig Dispense Refill   amiodarone (PACERONE) 200 MG tablet Take 1 tablet (200 mg total) by mouth daily. 90 tablet 3   atorvastatin (LIPITOR)  80 MG tablet TAKE ONE (1) TABLET BY MOUTH EVERY DAY 90 tablet 3   beta carotene w/minerals (OCUVITE) tablet Take 1 tablet by mouth daily. Unknown strength     carvedilol (COREG) 6.25 MG tablet Take 1 tablet (6.25 mg total) by mouth 2 (two) times daily. 180 tablet 3   chlorhexidine (PERIDEX) 0.12 % solution Use as directed 15 mLs in the mouth or throat 2 (two) times daily.     clobetasol ointment (TEMOVATE) 0.05 % APPLY TOPICALLY TWICE DAILY (Patient taking differently: Apply 1 application  topically 2 (two) times daily.) 30 g 1   dexamethasone (DECADRON) 4 MG tablet Take 8 mg by mouth daily.     empagliflozin (JARDIANCE) 25 MG TABS tablet Take 1 tablet (25 mg total) by mouth daily. 90 tablet 3   furosemide (LASIX) 40 MG tablet Take 1 tablet (40 mg total) by mouth daily. 90 tablet 3   IBU 800 MG tablet Take 800 mg by mouth every 6 (six) hours as needed for mild pain or moderate pain.     ketoconazole (NIZORAL) 2 % cream Apply topically 2 (two) times daily as needed for irritation. 30 g 1   losartan (COZAAR) 25 MG tablet TAKE ONE (1) TABLET BY MOUTH TWO (2) TIMES DAILY (Patient taking differently: Take 25 mg by mouth 2 (two) times daily. TAKE ONE (1) TABLET BY MOUTH TWO (2) TIMES DAILY) 180 tablet 3   metFORMIN (GLUCOPHAGE) 1000 MG tablet Take 0.5 tablets (500 mg total) by mouth 2 (two) times daily with a meal. 90 tablet 3   Multiple Vitamin (MULTIVITAMIN WITH MINERALS) TABS tablet Take 1 tablet by mouth daily. Unknown strength     sildenafil (VIAGRA) 100 MG tablet Take 0.5-1 tablets (50-100 mg total) by mouth daily as needed for erectile dysfunction. 5 tablet 11   No current facility-administered medications on file prior to visit.        ROS:  All others reviewed and negative.  Objective        PE:  BP (!) 106/58 (BP Location: Right Arm, Patient Position: Sitting, Cuff Size: Large)   Pulse (!) 49   Temp 97.8 F (36.6 C) (Oral)   Ht '5\' 11"'$  (1.803 m)   Wt 228 lb (103.4 kg)   SpO2 98%   BMI  31.80 kg/m                 Constitutional: Pt appears in NAD               HENT: Head: NCAT.                Right Ear: External ear normal.  Left Ear: External ear normal.                Eyes: . Pupils are equal, round, and reactive to light. Conjunctivae and EOM are normal               Nose: without d/c or deformity               Neck: Neck supple. Gross normal ROM               Cardiovascular: Normal rate and regular rhythm.                 Pulmonary/Chest: Effort normal and breath sounds without rales or wheezing.                Abd:  Soft, NT, ND, + BS, no organomegaly               Neurological: Pt is alert. At baseline orientation, motor grossly intact               Skin: Skin is warm. No rashes, no other new lesions, LE edema - none               Psychiatric: Pt behavior is normal without agitation   Micro: none  Cardiac tracings I have personally interpreted today:  none  Pertinent Radiological findings (summarize): none   Lab Results  Component Value Date   WBC 9.6 02/02/2021   HGB 14.6 02/02/2021   HCT 42.8 02/02/2021   PLT 123.0 (L) 02/02/2021   GLUCOSE 143 (H) 08/24/2021   CHOL 123 08/24/2021   TRIG 88.0 08/24/2021   HDL 42.80 08/24/2021   LDLDIRECT 92.0 02/06/2017   LDLCALC 62 08/24/2021   ALT 33 08/24/2021   AST 28 08/24/2021   NA 140 08/24/2021   K 4.1 08/24/2021   CL 103 08/24/2021   CREATININE 1.02 08/24/2021   BUN 19 08/24/2021   CO2 28 08/24/2021   TSH 3.12 02/02/2021   PSA 2.15 07/14/2016   INR 1.1 09/24/2008   HGBA1C 6.2 08/24/2021   MICROALBUR 0.8 02/02/2021   Assessment/Plan:  [name] is a 87 y.o. White or Caucasian [1] male with  has a past medical history of AAA (abdominal aortic aneurysm) ([place]), Abdominal aneurysm without mention of rupture (07/24/2012), Acute sinus infection (06/17/2009), Allergic rhinitis, Anxiety state (07/20/2007), Arthritis, Asymptomatic stenosis of left carotid artery without infarction, Bilateral  carotid artery stenosis, BPH (benign prostatic hypertrophy), Coronary artery calcification seen on CT scan (09/05/2017), Diabetes mellitus type 2, noninsulin dependent ([place]) (07/20/2007), Dilated cardiomyopathy ([place]) (12/14/2018), Diverticulosis of colon, Hearing loss of both ears (10/23/2013), History of cardiomyopathy, History of kidney stones, HTN (hypertension) (01/14/2016), Hypercalcemia (03/15/2019), Hyperlipidemia, Hypertension, Hypertensive heart disease without CHF, Obesity (BMI 30-39.9), Paresthesia of both feet (07/14/2016), Peripheral vascular disease ([place]), Personal history of kidney stones, Phimosis, PHN (postherpetic neuralgia) (09/18/2019), Preop exam for internal medicine (05/10/2016), Preventative health care (01/14/2016), Rash (05/01/2015), Right carotid artery occlusion, Rotator cuff arthropathy of both shoulders (04/10/2019), Spinal stenosis at L4-L5 level (06/08/2016), Stroke ([place]), Type 2 diabetes mellitus ([place]), and Wears hearing aid.  Vitamin D deficiency Last vitamin D Lab Results  Component Value Date   VD25OH 36.95 08/24/2021   Low, to start oral replacement  Type 2 diabetes mellitus ([place]) Lab Results  Component Value Date   HGBA1C 6.2 08/24/2021   overcontrolled pt to continue current medical treatment  - jardiance 25 qd, metformin 500 bid, but d/c glipizide for safety with  low sugars  Hyperlipidemia Lab Results  Component Value Date   LDLCALC 62 08/24/2021   Stable, pt to continue current statin lipitor 80 mg   HTN (hypertension) BP Readings from Last 3 Encounters:  08/26/21 (!) 106/58  06/29/21 114/70  05/24/21 112/72   Stable, pt to continue medical treatment coreg 25 bid, losartan 25 qd   B12 deficiency Lab Results  Component Value Date   VITAMINB12 289 08/24/2021   Low, to start oral replacement - b12 1000 mcg qd  Followup: Return in about 6 months (around 02/26/2022).  Cathlean Cower, MD 08/28/2021 9:24 PM [facility]

## 2021-08-26 NOTE — Patient Instructions (Signed)
Please take the Multivitamin - 1 per day to help the B12 and Vitamin D  Ok to STOP  the glipizide  Please continue all other medications as before, and refills have been done if requested.  Please have the pharmacy call with any other refills you may need.  Please continue your efforts at being more active, low cholesterol diet, and weight control.  You are otherwise up to date with prevention measures today.  Please keep your appointments with your specialists as you may have planned  Please make an Appointment to return in 6 months, or sooner if needed, also with Lab Appointment for testing done 3-5 days before at the [facility] (so this is for TWO appointments - please see the scheduling desk as you leave)

## 2021-08-28 ENCOUNTER — Encounter: Payer: Self-pay | Admitting: Internal Medicine

## 2021-08-28 DIAGNOSIS — E538 Deficiency of other specified B group vitamins: Secondary | ICD-10-CM | POA: Insufficient documentation

## 2021-08-28 NOTE — Assessment & Plan Note (Signed)
Last vitamin D Lab Results  Component Value Date   VD25OH 36.95 08/24/2021   Low, to start oral replacement

## 2021-08-28 NOTE — Assessment & Plan Note (Signed)
Lab Results  Component Value Date   LDLCALC 62 08/24/2021   Stable, pt to continue current statin lipitor 80 mg

## 2021-08-28 NOTE — Assessment & Plan Note (Signed)
Lab Results  Component Value Date   HGBA1C 6.2 08/24/2021   overcontrolled pt to continue current medical treatment  - jardiance 25 qd, metformin 500 bid, but d/c glipizide for safety with low sugars

## 2021-08-28 NOTE — Assessment & Plan Note (Signed)
BP Readings from Last 3 Encounters:  08/26/21 (!) 106/58  06/29/21 114/70  05/24/21 112/72   Stable, pt to continue medical treatment coreg 25 bid, losartan 25 qd

## 2021-08-28 NOTE — Assessment & Plan Note (Signed)
Lab Results  Component Value Date   VITAMINB12 289 08/24/2021   Low, to start oral replacement - b12 1000 mcg qd

## 2021-09-02 ENCOUNTER — Other Ambulatory Visit: Payer: Self-pay | Admitting: Internal Medicine

## 2021-09-02 ENCOUNTER — Encounter: Payer: Self-pay | Admitting: *Deleted

## 2021-09-02 ENCOUNTER — Ambulatory Visit (INDEPENDENT_AMBULATORY_CARE_PROVIDER_SITE_OTHER): Payer: Medicare HMO

## 2021-09-02 DIAGNOSIS — I493 Ventricular premature depolarization: Secondary | ICD-10-CM

## 2021-09-02 NOTE — Progress Notes (Signed)
Patient ID: [name], male   DOB: [birth year], 87 y.o.   MRN: [id number] Theodore [facility] to cancel their charges on ZIO patch monitor started 07/13/21.  Patient was not supposed to start monitor until 10/08/21.  Results of 07/13/21 study are available in chart and assigned for physician to review and interpret.

## 2021-09-02 NOTE — Progress Notes (Unsigned)
Enrolled patient for a 14 day Zio XT monitor to be mailed to patients home Patient to wear beginning of sept.

## 2021-09-16 ENCOUNTER — Other Ambulatory Visit: Payer: Self-pay | Admitting: Internal Medicine

## 2021-09-16 NOTE — Telephone Encounter (Signed)
Please refill as per office routine med refill policy (all routine meds to be refilled for 3 mo or monthly (per pt preference) up to one year from last visit, then month to month grace period for 3 mo, then further med refills will have to be denied) ? ?

## 2021-09-28 ENCOUNTER — Other Ambulatory Visit: Payer: Self-pay | Admitting: Internal Medicine

## 2021-10-06 DIAGNOSIS — I493 Ventricular premature depolarization: Secondary | ICD-10-CM

## 2021-10-12 ENCOUNTER — Telehealth: Payer: Self-pay | Admitting: Internal Medicine

## 2021-10-12 NOTE — Telephone Encounter (Signed)
Patient states he received two heart monitors in the mail. He says he is wearing one of them and is not sure what to do with the other.

## 2021-10-15 ENCOUNTER — Ambulatory Visit: Payer: Medicare HMO | Attending: Cardiology | Admitting: Cardiology

## 2021-10-15 ENCOUNTER — Encounter: Payer: Self-pay | Admitting: Cardiology

## 2021-10-15 ENCOUNTER — Ambulatory Visit (HOSPITAL_BASED_OUTPATIENT_CLINIC_OR_DEPARTMENT_OTHER)
Admission: RE | Admit: 2021-10-15 | Discharge: 2021-10-15 | Disposition: A | Discharge status: Home / Self Care | Payer: Medicare HMO | Source: Ambulatory Visit | Attending: Cardiology | Admitting: Cardiology

## 2021-10-15 VITALS — BP 122/60 | HR 64 | Ht 71.0 in | Wt 218.0 lb

## 2021-10-15 DIAGNOSIS — E1165 Type 2 diabetes mellitus with hyperglycemia: Secondary | ICD-10-CM | POA: Diagnosis not present

## 2021-10-15 DIAGNOSIS — J90 Pleural effusion, not elsewhere classified: Secondary | ICD-10-CM | POA: Diagnosis not present

## 2021-10-15 DIAGNOSIS — I10 Essential (primary) hypertension: Secondary | ICD-10-CM | POA: Diagnosis not present

## 2021-10-15 DIAGNOSIS — I7143 Infrarenal abdominal aortic aneurysm, without rupture: Secondary | ICD-10-CM | POA: Diagnosis not present

## 2021-10-15 DIAGNOSIS — I420 Dilated cardiomyopathy: Secondary | ICD-10-CM

## 2021-10-15 DIAGNOSIS — E782 Mixed hyperlipidemia: Secondary | ICD-10-CM

## 2021-10-15 DIAGNOSIS — R0609 Other forms of dyspnea: Secondary | ICD-10-CM | POA: Diagnosis not present

## 2021-10-15 DIAGNOSIS — I739 Peripheral vascular disease, unspecified: Secondary | ICD-10-CM | POA: Diagnosis not present

## 2021-10-15 NOTE — Progress Notes (Signed)
Cardiology Office Note:    Date:  10/15/2021   ID:  [name], DOB [birth year], MRN [id number]  PCP:  Biagio Borg, MD  Cardiologist:  Jenne Campus, MD    Referring MD: Biagio Borg, MD   Chief Complaint  Patient presents with   low BP    Low HR started yesterday, c/o dizziness     History of Present Illness:    [name] is a 87 y.o. male with past medical history significant for abdominal aortic aneurysm measuring only 37 mm, recently checked this year, also ascending aortic aneurysm measuring 40 mm, remote coronary artery disease, cardiomyopathy with stable ejection fraction in the neighborhood of 40 to 45%, essential hypertension, diabetes.  He is in my office today for follow-up yesterday he got slow heart rate as well as low blood pressure.  New discovery was also frequent ventricular ectopy and he did see my EP colleague Dr. Dorothea Ogle.  Overall he said he is feeling well denies have any chest pain tightness squeezing pressure burning chest.  Past Medical History:  Diagnosis Date   AAA (abdominal aortic aneurysm) ([place])    MONITORED BY DR Kellie Simmering LOV JUNE 2014--  STABLE  AT 3.7   Abdominal aneurysm without mention of rupture 07/24/2012   Acute sinus infection 06/17/2009   Qualifier: Diagnosis of  By: Jenny Reichmann MD, Hunt Oris    Allergic rhinitis    Anxiety state 07/20/2007   Arthritis    Asymptomatic stenosis of left carotid artery without infarction    MILD DISEASE   Bilateral carotid artery stenosis    BPH (benign prostatic hypertrophy)    Coronary artery calcification seen on CT scan 09/05/2017   Diabetes mellitus type 2, noninsulin dependent ([place]) 07/20/2007   Dilated cardiomyopathy ([place]) 12/14/2018   Diverticulosis of colon    Hearing loss of both ears 10/23/2013   History of cardiomyopathy    IDIOPATHIC-- PER [place] NOTE 2013 , CURRENTLY RESOLVED   History of kidney stones    HTN (hypertension) 01/14/2016   Hypercalcemia 03/15/2019   Hyperlipidemia    Hypertension     Hypertensive heart disease without CHF    Obesity (BMI 30-39.9)    Paresthesia of both feet 07/14/2016   Peripheral vascular disease ([place])    Personal history of kidney stones    Phimosis    PHN (postherpetic neuralgia) 09/18/2019   Preop exam for internal medicine 05/10/2016   Preventative health care 01/14/2016   Rash 05/01/2015   Right carotid artery occlusion    CHRONIC   WITHOUT INFARTION   Rotator cuff arthropathy of both shoulders 04/10/2019   Spinal stenosis at L4-L5 level 06/08/2016   Stroke ([place])     Right eye   Type 2 diabetes mellitus ([place])    Wears hearing aid     Past Surgical History:  Procedure Laterality Date   CARDIOVASCULAR STRESS TEST  JUNE 2008  DR Wynonia Lawman   CATARACT EXTRACTION W/ INTRAOCULAR LENS IMPLANT Right    CIRCUMCISION N/A 08/30/2012   Procedure: CIRCUMCISION ADULT;  Surgeon: Malka So, MD;  [facility];  Service: Urology;  Laterality: N/A;   CYSTO/  RIGHT URETERAL STENT PLACEMENT  10-16-2003   EXTRACORPOREAL SHOCK WAVE LITHOTRIPSY Right 2005   EYE SURGERY     JOINT REPLACEMENT Bilateral 2008  2009   Knee   LUMBAR LAMINECTOMY/DECOMPRESSION MICRODISCECTOMY Bilateral 06/08/2016   Procedure: Bilateral Microlumbar decompression L4-L5 and L5-S1;  Surgeon: Susa Day, MD;  Location[facility];  Service: Orthopedics;  Laterality: Bilateral;  Requests 2 hours   TOTAL KNEE ARTHROPLASTY Bilateral RIGHT 10-08-2007;   LEFT 09-30-2008   TRANSTHORACIC ECHOCARDIOGRAM  10-21-2009   LVEF 50%    Current Medications: Current Meds  Medication Sig   amiodarone (PACERONE) 200 MG tablet Take 1 tablet (200 mg total) by mouth daily.   atorvastatin (LIPITOR) 80 MG tablet TAKE ONE (1) TABLET BY MOUTH EVERY DAY (Patient taking differently: Take 80 mg by mouth daily.)   beta carotene w/minerals (OCUVITE) tablet Take 1 tablet by mouth daily. Unknown strength   carvedilol (COREG) 6.25 MG tablet Take 1 tablet (6.25 mg total) by mouth 2 (two) times daily.    chlorhexidine (PERIDEX) 0.12 % solution Use as directed 15 mLs in the mouth or throat 2 (two) times daily.   clobetasol ointment (TEMOVATE) 0.05 % APPLY TOPICALLY TWICE DAILY (Patient taking differently: Apply 1 application  topically 2 (two) times daily.)   dexamethasone (DECADRON) 4 MG tablet Take 8 mg by mouth daily.   empagliflozin (JARDIANCE) 25 MG TABS tablet Take 1 tablet (25 mg total) by mouth daily.   furosemide (LASIX) 40 MG tablet Take 1 tablet (40 mg total) by mouth daily.   IBU 800 MG tablet Take 800 mg by mouth every 6 (six) hours as needed for mild pain or moderate pain.   ketoconazole (NIZORAL) 2 % cream Apply topically 2 (two) times daily as needed for irritation. (Patient taking differently: Apply 1 Application topically 2 (two) times daily as needed for irritation.)   losartan (COZAAR) 25 MG tablet TAKE ONE (1) TABLET BY MOUTH TWO (2) TIMES DAILY (Patient taking differently: Take 25 mg by mouth daily. TAKE ONE (1) TABLET BY MOUTH TWO (2) TIMES DAILY)   metFORMIN (GLUCOPHAGE) 1000 MG tablet Take 0.5 tablets (500 mg total) by mouth 2 (two) times daily with a meal.   Multiple Vitamin (MULTIVITAMIN WITH MINERALS) TABS tablet Take 1 tablet by mouth daily. Unknown strength   sildenafil (VIAGRA) 100 MG tablet TAKE 1/2 TO 1 TABLET BY MOUTH DAILY AS NEEDED FOR ERECTILE DYSFUNCTION (Patient taking differently: Take 25-50 mg by mouth as needed for erectile dysfunction.)     Allergies:   Patient has no known allergies.   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  Tobacco Use   Smoking status: Former    Packs/day: 0.75    Years: 25.00    Total pack years: 18.75    Types: Cigarettes    Quit date:  07/25/1982    Years since quitting: 39.2   Smokeless tobacco: Never  Vaping Use   Vaping Use: Never used  Substance and Sexual Activity   Alcohol use: No    Alcohol/week: 0.0 standard drinks of alcohol    Comment: recovering alcoholic  +[id number]   Drug use: No   Sexual activity: Not on file  Other Topics Concern   Not on file  Social History Narrative   Not on file   Social Determinants of Health   Financial Resource Strain: Low Risk  (08/06/2021)   Overall Financial Resource Strain (CARDIA)    Difficulty of Paying Living Expenses: Not hard at all  Food Insecurity: No Food Insecurity (08/06/2021)   Hunger Vital Sign    Worried About Running Out of Food in the Last Year: Never true    Ran Out of Food in the Last Year: Never true  Transportation Needs: No Transportation Needs (08/06/2021)   PRAPARE - Hydrologist (Medical): No    Lack of Transportation (Non-Medical): No  Physical Activity: Sufficiently Active (08/06/2021)   Exercise Vital Sign    Days of Exercise per Week: 5 days    Minutes of Exercise per Session: 30 min  Stress: No Stress Concern Present (08/06/2021)   [place]    Feeling of Stress : Not at all  Social Connections: Moderately Integrated (08/06/2021)   Social Connection and Isolation Panel [NHANES]    Frequency of Communication with Friends and Family: More than three times a week    Frequency of Social Gatherings with Friends and Family: More than three times a week    Attends Religious Services: 1 to 4 times per year    Active Member of [employer] or Organizations: No    Attends Archivist Meetings: 1 to 4 times per year    Marital Status: Widowed     Family History: The patient's family history includes ALS in his father; Asthma in his mother; COPD in his mother; Depression in his mother; Diabetes in his father; Heart disease in his mother; Hypertension in his  mother; Kidney disease in his mother. There is no history of Colon cancer. ROS:   Please see the history of present illness.    All 14 point review of systems negative except as described per history of present illness  EKGs/Labs/Other Studies Reviewed:      Recent Labs: 02/02/2021: Hemoglobin 14.6; Platelets 123.0; TSH 3.12 08/24/2021: ALT 33; BUN 19; Creatinine, Ser 1.02; Potassium 4.1; Sodium 140  Recent Lipid Panel    Component Value Date/Time   CHOL 123 08/24/2021 1009   CHOL 124 03/11/2020 1108   TRIG 88.0 08/24/2021 1009   HDL 42.80 08/24/2021 1009   HDL 41 03/11/2020 1108   CHOLHDL 3 08/24/2021 1009   VLDL 17.6 08/24/2021 1009   LDLCALC 62 08/24/2021 1009   LDLCALC 67 03/11/2020 1108   LDLDIRECT 92.0 02/06/2017 1119    Physical Exam:    VS:  BP 122/60 (BP Location: Right Arm, Patient Position: Sitting)   Pulse 64   Ht '5\' 11"'$  (1.803 m)   Wt 218 lb (98.9 kg)   SpO2 98%   BMI 30.40 kg/m     Wt Readings from Last 3 Encounters:  10/15/21 218 lb (98.9 kg)  08/26/21 228 lb (103.4 kg)  06/29/21 241 lb (109.3 kg)     GEN:  Well nourished, well developed in no acute distress HEENT: Normal NECK: No JVD; No carotid bruits LYMPHATICS: No lymphadenopathy CARDIAC: RRR, no murmurs, no rubs, no gallops RESPIRATORY:  Clear to auscultation without rales, wheezing or rhonchi  ABDOMEN: Soft, non-tender, non-distended MUSCULOSKELETAL:  No edema; No deformity  SKIN: Warm and dry LOWER EXTREMITIES: no swelling NEUROLOGIC:  Alert and oriented x 3 PSYCHIATRIC:  Normal affect   ASSESSMENT:    1. Dilated cardiomyopathy ([place])   2. Primary hypertension   3. Peripheral vascular disease ([place])   4. Infrarenal abdominal aortic aneurysm (AAA) without rupture ([place])   5. Type 2 diabetes mellitus with hyperglycemia, without long-term current use of insulin ([facility])   6. Mixed hyperlipidemia  PLAN:    In order of problems listed above:  Dilated cardiomyopathy.  Difficulty putting  on appropriate medication because of low blood pressure and in the matter-of-fact yesterday he suffered from low blood pressure, so his wife simply did not give him carvedilol he is being taking already small dose of carvedilol 3.125 mg twice daily.  I will continue present management which include small dose of Lasix, Cozaar.  He seems to be compensated today Bradycardia.  He does have frequent ventricular ectopy, he was put on amiodarone by electrophysiologist, will continue that management. Vascular disease but none of this appears to be critical and required any intervention. I did review his CT from March and he had it done to look at the size of the aorta.  There are some pleural effusion noted I did not see this on physical exam today.  We will do chest x-ray to confirm improvement in that area.  There was also some concern about having cirrhosis of the liver, I will do complete metabolic panel, that need to be follow-up by primary care physician Type 2 diabetes followed by internal medicine team Dyslipidemia did review his K PN which show me his HDL of 42 LDL 62.  We will continue present management Frequent ventricular ectopy, he is on amiodarone which I will continue   Medication Adjustments/Labs and Tests Ordered: Current medicines are reviewed at length with the patient today.  Concerns regarding medicines are outlined above.  No orders of the defined types were placed in this encounter.  Medication changes: No orders of the defined types were placed in this encounter.   Signed, Park Liter, MD, [facility] 10/15/2021 1:30 PM    [place]

## 2021-10-15 NOTE — Patient Instructions (Addendum)
Medication Instructions:  Your physician recommends that you continue on your current medications as directed. Please refer to the Current Medication list given to you today.  *If you need a refill on your cardiac medications before your next appointment, please call your pharmacy*   Lab Work: Your physician recommends that you return for lab work in: Monday You need to have labs done when you are fasting.  You can come Monday through Friday 8:30 am to 11:30am and 1:00 to 4:30. Thursday and Friday- MORNING HOURS ONLY-You do not need to make an appointment as the order has already been placed. The labs you are going to have done are CMP, ProBNP   Testing/Procedures: Chest X- ray   Follow-Up: At [facility], you and your health needs are our priority.  As part of our continuing mission to provide you with exceptional heart care, we have created designated Provider Care Teams.  These Care Teams include your primary Cardiologist (physician) and Advanced Practice Providers (APPs -  Physician Assistants and Nurse Practitioners) who all work together to provide you with the care you need, when you need it.  We recommend signing up for the patient portal called "MyChart".  Sign up information is provided on this After Visit Summary.  MyChart is used to connect with patients for Virtual Visits (Telemedicine).  Patients are able to view lab/test results, encounter notes, upcoming appointments, etc.  Non-urgent messages can be sent to your provider as well.   To learn more about what you can do with MyChart, go to [email or web address].    Your next appointment:   3 month(s)  The format for your next appointment:   In Person  Provider:   Jenne Campus, MD    Other Instructions NA

## 2021-10-15 NOTE — Addendum Note (Signed)
Addended by: Jacobo Forest D on: 10/15/2021 01:45 PM   Modules accepted: Orders

## 2021-10-22 DIAGNOSIS — R69 Illness, unspecified: Secondary | ICD-10-CM | POA: Diagnosis not present

## 2021-10-22 DIAGNOSIS — E1151 Type 2 diabetes mellitus with diabetic peripheral angiopathy without gangrene: Secondary | ICD-10-CM | POA: Diagnosis not present

## 2021-10-22 DIAGNOSIS — I4891 Unspecified atrial fibrillation: Secondary | ICD-10-CM | POA: Diagnosis not present

## 2021-10-22 DIAGNOSIS — Z6831 Body mass index (BMI) 31.0-31.9, adult: Secondary | ICD-10-CM | POA: Diagnosis not present

## 2021-10-22 DIAGNOSIS — I10 Essential (primary) hypertension: Secondary | ICD-10-CM | POA: Diagnosis not present

## 2021-10-22 DIAGNOSIS — Z7984 Long term (current) use of oral hypoglycemic drugs: Secondary | ICD-10-CM | POA: Diagnosis not present

## 2021-10-22 DIAGNOSIS — F1021 Alcohol dependence, in remission: Secondary | ICD-10-CM | POA: Diagnosis not present

## 2021-10-22 DIAGNOSIS — E669 Obesity, unspecified: Secondary | ICD-10-CM | POA: Diagnosis not present

## 2021-10-22 DIAGNOSIS — I69398 Other sequelae of cerebral infarction: Secondary | ICD-10-CM | POA: Diagnosis not present

## 2021-10-22 DIAGNOSIS — Z8249 Family history of ischemic heart disease and other diseases of the circulatory system: Secondary | ICD-10-CM | POA: Diagnosis not present

## 2021-10-22 DIAGNOSIS — Z823 Family history of stroke: Secondary | ICD-10-CM | POA: Diagnosis not present

## 2021-10-22 DIAGNOSIS — R609 Edema, unspecified: Secondary | ICD-10-CM | POA: Diagnosis not present

## 2021-10-22 DIAGNOSIS — M1990 Unspecified osteoarthritis, unspecified site: Secondary | ICD-10-CM | POA: Diagnosis not present

## 2021-10-27 ENCOUNTER — Ambulatory Visit (INDEPENDENT_AMBULATORY_CARE_PROVIDER_SITE_OTHER): Payer: Medicare HMO | Admitting: Internal Medicine

## 2021-10-27 ENCOUNTER — Telehealth: Payer: Self-pay

## 2021-10-27 ENCOUNTER — Telehealth: Payer: Self-pay | Admitting: Internal Medicine

## 2021-10-27 VITALS — BP 120/64 | HR 53 | Temp 97.9°F | Ht 71.0 in | Wt 220.4 lb

## 2021-10-27 DIAGNOSIS — Z23 Encounter for immunization: Secondary | ICD-10-CM | POA: Diagnosis not present

## 2021-10-27 DIAGNOSIS — E559 Vitamin D deficiency, unspecified: Secondary | ICD-10-CM

## 2021-10-27 DIAGNOSIS — M5416 Radiculopathy, lumbar region: Secondary | ICD-10-CM

## 2021-10-27 DIAGNOSIS — I10 Essential (primary) hypertension: Secondary | ICD-10-CM | POA: Diagnosis not present

## 2021-10-27 DIAGNOSIS — E1165 Type 2 diabetes mellitus with hyperglycemia: Secondary | ICD-10-CM | POA: Diagnosis not present

## 2021-10-27 MED ORDER — PREDNISONE 10 MG PO TABS: ORAL_TABLET | ORAL | 0 refills | Status: DC

## 2021-10-27 MED ORDER — CYCLOBENZAPRINE HCL 5 MG PO TABS: 5.0000 mg | ORAL_TABLET | Freq: Three times a day (TID) | ORAL | 1 refills | Status: DC | PRN

## 2021-10-27 MED ORDER — GABAPENTIN 100 MG PO CAPS: 100.0000 mg | ORAL_CAPSULE | Freq: Three times a day (TID) | ORAL | 3 refills | Status: DC

## 2021-10-27 MED ORDER — TRAMADOL HCL 50 MG PO TABS: 50.0000 mg | ORAL_TABLET | Freq: Four times a day (QID) | ORAL | 0 refills | Status: DC | PRN

## 2021-10-27 MED ORDER — GLUCOSE BLOOD VI STRP
ORAL_STRIP | 12 refills | Status: DC
Start: 2021-10-27 — End: 2021-10-28

## 2021-10-27 NOTE — Telephone Encounter (Signed)
Patient called back after visit and said that his monitor is a free style lite - this is the one he needs strips ordered for.

## 2021-10-27 NOTE — Telephone Encounter (Signed)
-----   Message from Park Liter, MD sent at 10/26/2021 10:30 AM EDT ----- Chest x-ray improved, pleural effusion disappeared

## 2021-10-27 NOTE — Telephone Encounter (Signed)
Patient notified of results.

## 2021-10-27 NOTE — Progress Notes (Signed)
Patient ID: [name], male   DOB: [birth year], 87 y.o.   MRN: [id number]        Chief Complaint: follow up HTN, and hyperglycemia, and right low back pain with right leg weakness       HPI:  [name] is a 87 y.o. male here with c/o 6 wks onset gradually worsening now mod to severe dull and sharp pain to the right lower back with radiation to the right buttuck, as well as right leg weakness.  No falls or dragging the leg.  Has hx of lumbar DJD by xray 2018.  Pt denies chest pain, increased sob or doe, wheezing, orthopnea, PND, increased LE swelling, palpitations, dizziness or syncope.   Pt denies polydipsia, polyuria, or new focal neuro s/s.    Pt denies fever, wt loss, night sweats, loss of appetite, or other constitutional symptoms  Needs Flu shot, and DM supplies       Wt Readings from Last 3 Encounters:  10/27/21 220 lb 6 oz (100 kg)  10/15/21 218 lb (98.9 kg)  08/26/21 228 lb (103.4 kg)   BP Readings from Last 3 Encounters:  10/27/21 120/64  10/15/21 122/60  08/26/21 (!) 106/58         Past Medical History:  Diagnosis Date   AAA (abdominal aortic aneurysm) ([place])    MONITORED BY DR Kellie Simmering LOV JUNE 2014--  STABLE  AT 3.7   Abdominal aneurysm without mention of rupture 07/24/2012   Acute sinus infection 06/17/2009   Qualifier: Diagnosis of  By: Jenny Reichmann MD, Hunt Oris    Allergic rhinitis    Anxiety state 07/20/2007   Arthritis    Asymptomatic stenosis of left carotid artery without infarction    MILD DISEASE   Bilateral carotid artery stenosis    BPH (benign prostatic hypertrophy)    Coronary artery calcification seen on CT scan 09/05/2017   Diabetes mellitus type 2, noninsulin dependent ([place]) 07/20/2007   Dilated cardiomyopathy ([place]) 12/14/2018   Diverticulosis of colon    Hearing loss of both ears 10/23/2013   History of cardiomyopathy    IDIOPATHIC-- PER [facility] NOTE 2013 , CURRENTLY RESOLVED   History of kidney stones    HTN (hypertension) 01/14/2016   Hypercalcemia  03/15/2019   Hyperlipidemia    Hypertension    Hypertensive heart disease without CHF    Obesity (BMI 30-39.9)    Paresthesia of both feet 07/14/2016   Peripheral vascular disease ([place])    Personal history of kidney stones    Phimosis    PHN (postherpetic neuralgia) 09/18/2019   Preop exam for internal medicine 05/10/2016   Preventative health care 01/14/2016   Rash 05/01/2015   Right carotid artery occlusion    CHRONIC   WITHOUT INFARTION   Rotator cuff arthropathy of both shoulders 04/10/2019   Spinal stenosis at L4-L5 level 06/08/2016   Stroke ([place])     Right eye   Type 2 diabetes mellitus ([place])    Wears hearing aid    Past Surgical History:  Procedure Laterality Date   CARDIOVASCULAR STRESS TEST  JUNE 2008  DR Wynonia Lawman   CATARACT EXTRACTION W/ INTRAOCULAR LENS IMPLANT Right    CIRCUMCISION N/A 08/30/2012   Procedure: CIRCUMCISION ADULT;  Surgeon: Malka So, MD;  [facility];  Service: Urology;  Laterality: N/A;   CYSTO/  RIGHT URETERAL STENT PLACEMENT  10-16-2003   EXTRACORPOREAL SHOCK WAVE LITHOTRIPSY Right 2005   EYE SURGERY     JOINT REPLACEMENT  Bilateral 2008  2009   Knee   LUMBAR LAMINECTOMY/DECOMPRESSION MICRODISCECTOMY Bilateral 06/08/2016   Procedure: Bilateral Microlumbar decompression L4-L5 and L5-S1;  Surgeon: Susa Day, MD;  Location: [facility];  Service: Orthopedics;  Laterality: Bilateral;  Requests 2 hours   TOTAL KNEE ARTHROPLASTY Bilateral RIGHT 10-08-2007;   LEFT 09-30-2008   TRANSTHORACIC ECHOCARDIOGRAM  10-21-2009   LVEF 50%    reports that he quit smoking about 39 years ago. His smoking use included cigarettes. He has a 18.75 pack-year smoking history. He has never used smokeless tobacco. He reports that he does not drink alcohol and does not use drugs. family history includes ALS in his father; Asthma in his mother; COPD in his mother; Depression in his mother; Diabetes in his father; Heart disease in his mother; Hypertension in his mother;  Kidney disease in his mother. No Known Allergies Current Outpatient Medications on File Prior to Visit  Medication Sig Dispense Refill   amiodarone (PACERONE) 200 MG tablet Take 1 tablet (200 mg total) by mouth daily. 90 tablet 3   atorvastatin (LIPITOR) 80 MG tablet TAKE ONE (1) TABLET BY MOUTH EVERY DAY (Patient taking differently: Take 80 mg by mouth daily.) 90 tablet 3   beta carotene w/minerals (OCUVITE) tablet Take 1 tablet by mouth daily. Unknown strength     chlorhexidine (PERIDEX) 0.12 % solution Use as directed 15 mLs in the mouth or throat 2 (two) times daily.     clobetasol ointment (TEMOVATE) 0.05 % APPLY TOPICALLY TWICE DAILY (Patient taking differently: Apply 1 application  topically 2 (two) times daily.) 30 g 1   dexamethasone (DECADRON) 4 MG tablet Take 8 mg by mouth daily.     furosemide (LASIX) 40 MG tablet Take 1 tablet (40 mg total) by mouth daily. 90 tablet 3   IBU 800 MG tablet Take 800 mg by mouth every 6 (six) hours as needed for mild pain or moderate pain.     ketoconazole (NIZORAL) 2 % cream Apply topically 2 (two) times daily as needed for irritation. (Patient taking differently: Apply 1 Application topically 2 (two) times daily as needed for irritation.) 30 g 1   losartan (COZAAR) 25 MG tablet TAKE ONE (1) TABLET BY MOUTH TWO (2) TIMES DAILY (Patient taking differently: Take 25 mg by mouth daily. TAKE ONE (1) TABLET BY MOUTH TWO (2) TIMES DAILY) 180 tablet 2   metFORMIN (GLUCOPHAGE) 1000 MG tablet Take 0.5 tablets (500 mg total) by mouth 2 (two) times daily with a meal. 90 tablet 3   Multiple Vitamin (MULTIVITAMIN WITH MINERALS) TABS tablet Take 1 tablet by mouth daily. Unknown strength     sildenafil (VIAGRA) 100 MG tablet TAKE 1/2 TO 1 TABLET BY MOUTH DAILY AS NEEDED FOR ERECTILE DYSFUNCTION (Patient taking differently: Take 25-50 mg by mouth as needed for erectile dysfunction.) 5 tablet 11   No current facility-administered medications on file prior to visit.         ROS:  All others reviewed and negative.  Objective        PE:  BP 120/64   Pulse (!) 53   Temp 97.9 F (36.6 C) (Oral)   Ht '5\' 11"'$  (1.803 m)   Wt 220 lb 6 oz (100 kg)   SpO2 96%   BMI 30.74 kg/m                 Constitutional: Pt appears in NAD               HENT: Head:  NCAT.                Right Ear: External ear normal.                 Left Ear: External ear normal.                Eyes: . Pupils are equal, round, and reactive to light. Conjunctivae and EOM are normal               Nose: without d/c or deformity               Neck: Neck supple. Gross normal ROM               Cardiovascular: Normal rate and regular rhythm.                 Pulmonary/Chest: Effort normal and breath sounds without rales or wheezing.                Abd:  Soft, NT, ND, + BS, no organomegaly               Lumbar spine nontender in midline               Has tender right lumbar paravertebral spasm               Neurological: Pt is alert. At baseline orientation, motor with RLE 4/5 weakness               Skin: Skin is warm. No rashes, no other new lesions, LE edema - none               Psychiatric: Pt behavior is normal without agitation   Micro: none  Cardiac tracings I have personally interpreted today:  none  Pertinent Radiological findings (summarize): none   Lab Results  Component Value Date   WBC 9.6 02/02/2021   HGB 14.6 02/02/2021   HCT 42.8 02/02/2021   PLT 123.0 (L) 02/02/2021   GLUCOSE 143 (H) 08/24/2021   CHOL 123 08/24/2021   TRIG 88.0 08/24/2021   HDL 42.80 08/24/2021   LDLDIRECT 92.0 02/06/2017   LDLCALC 62 08/24/2021   ALT 33 08/24/2021   AST 28 08/24/2021   NA 140 08/24/2021   K 4.1 08/24/2021   CL 103 08/24/2021   CREATININE 1.02 08/24/2021   BUN 19 08/24/2021   CO2 28 08/24/2021   TSH 3.12 02/02/2021   PSA 2.15 07/14/2016   INR 1.1 09/24/2008   HGBA1C 6.2 08/24/2021   MICROALBUR 0.8 02/02/2021   Assessment/Plan:  [name] is a 87 y.o. White or  Caucasian [1] male with  has a past medical history of AAA (abdominal aortic aneurysm) ([place]), Abdominal aneurysm without mention of rupture (07/24/2012), Acute sinus infection (06/17/2009), Allergic rhinitis, Anxiety state (07/20/2007), Arthritis, Asymptomatic stenosis of left carotid artery without infarction, Bilateral carotid artery stenosis, BPH (benign prostatic hypertrophy), Coronary artery calcification seen on CT scan (09/05/2017), Diabetes mellitus type 2, noninsulin dependent ([place]) (07/20/2007), Dilated cardiomyopathy ([place]) (12/14/2018), Diverticulosis of colon, Hearing loss of both ears (10/23/2013), History of cardiomyopathy, History of kidney stones, HTN (hypertension) (01/14/2016), Hypercalcemia (03/15/2019), Hyperlipidemia, Hypertension, Hypertensive heart disease without CHF, Obesity (BMI 30-39.9), Paresthesia of both feet (07/14/2016), Peripheral vascular disease ([place]), Personal history of kidney stones, Phimosis, PHN (postherpetic neuralgia) (09/18/2019), Preop exam for internal medicine (05/10/2016), Preventative health care (01/14/2016), Rash (05/01/2015), Right carotid artery occlusion, Rotator cuff arthropathy of both shoulders (04/10/2019), Spinal stenosis at L4-L5  level (06/08/2016), Stroke [facility] [place]), Type 2 diabetes mellitus ([place]), and Wears hearing aid.  HTN (hypertension) BP Readings from Last 3 Encounters:  10/27/21 120/64  10/15/21 122/60  08/26/21 (!) 106/58   Stable, pt to continue medical treatment losartan 25 mg qd   Right lumbar radiculopathy New onset witt neuro change - RLE radicular pain, nunb, weakness - fir MRI LS spine, to take all new medication as prescribed - the tramadol as needed for pain, flexeril as needed for muscle relaxer, prednisone, and gabapentin for the nerve pain   Type 2 diabetes mellitus ([place]) Lab Results  Component Value Date   HGBA1C 6.2 08/24/2021   Stable, pt to continue current medical treatment metformin 1000 1/2 bid   Vitamin D deficiency Last vitamin  D Lab Results  Component Value Date   VD25OH 36.95 08/24/2021   Low, reminded to start oral replacement  Followup: Return if symptoms worsen or fail to improve.  Cathlean Cower, MD 10/29/2021 9:52 PM [facility]

## 2021-10-27 NOTE — Patient Instructions (Addendum)
Remember you mychart PW is [email or web address]  Please let us know the name of the glucometer and if you need more strips  You will be contacted regarding the referral for: Lumbar MRI  Please take all new medication as prescribed - the tramadol as needed for pain, flexeril as needed for muscle relaxer, prednisone, and gabapentin for the nerve pain  Please continue all other medications as before  Please have the pharmacy call with any other refills you may need.  Please continue your efforts at being more active, low cholesterol diet, and weight control.  Please keep your appointments with your specialists as you may have planned  Please make an Appointment to return in Jan 2024 as planned, or sooner if needed

## 2021-10-28 MED ORDER — FREESTYLE LITE TEST VI STRP: ORAL_STRIP | 3 refills | Status: DC

## 2021-10-28 NOTE — Telephone Encounter (Signed)
Ok this is done 

## 2021-10-29 ENCOUNTER — Encounter: Payer: Self-pay | Admitting: Internal Medicine

## 2021-10-29 NOTE — Assessment & Plan Note (Signed)
Last vitamin D Lab Results  Component Value Date   VD25OH 36.95 08/24/2021   Low, reminded to start oral replacement

## 2021-10-29 NOTE — Assessment & Plan Note (Signed)
Lab Results  Component Value Date   HGBA1C 6.2 08/24/2021   Stable, pt to continue current medical treatment metformin 1000 1/2 bid

## 2021-10-29 NOTE — Assessment & Plan Note (Signed)
New onset witt neuro change - RLE radicular pain, nunb, weakness - fir MRI LS spine, to take all new medication as prescribed - the tramadol as needed for pain, flexeril as needed for muscle relaxer, prednisone, and gabapentin for the nerve pain

## 2021-10-29 NOTE — Assessment & Plan Note (Signed)
BP Readings from Last 3 Encounters:  10/27/21 120/64  10/15/21 122/60  08/26/21 (!) 106/58   Stable, pt to continue medical treatment losartan 25 mg qd

## 2021-11-03 DIAGNOSIS — I493 Ventricular premature depolarization: Secondary | ICD-10-CM | POA: Diagnosis not present

## 2021-11-24 ENCOUNTER — Other Ambulatory Visit: Payer: Medicare HMO

## 2021-12-02 ENCOUNTER — Ambulatory Visit: Payer: Medicare HMO | Attending: Internal Medicine | Admitting: Internal Medicine

## 2021-12-02 ENCOUNTER — Encounter: Payer: Self-pay | Admitting: Internal Medicine

## 2021-12-02 VITALS — BP 112/78 | HR 57 | Ht 71.0 in | Wt 219.0 lb

## 2021-12-02 DIAGNOSIS — I493 Ventricular premature depolarization: Secondary | ICD-10-CM

## 2021-12-02 DIAGNOSIS — I10 Essential (primary) hypertension: Secondary | ICD-10-CM

## 2021-12-02 NOTE — Patient Instructions (Signed)

## 2021-12-02 NOTE — Progress Notes (Signed)
HPI [name] returns today for followup. He is a pleasant 87 yo man with a h/o PVC's and class 2 dyspnea. He was switched from HCTZ to lasix and his dyspnea improved. He was placed on low dose amio and his PVC's decreased from over 20% to 1%. He feels well. His dose of lasix was decreased from 40 to 20 mg when his bp got a little low. He denies palpitations.  No Known Allergies   Current Outpatient Medications  Medication Sig Dispense Refill   amiodarone (PACERONE) 200 MG tablet Take 1 tablet (200 mg total) by mouth daily. 90 tablet 3   atorvastatin (LIPITOR) 80 MG tablet TAKE ONE (1) TABLET BY MOUTH EVERY DAY (Patient taking differently: Take 80 mg by mouth daily.) 90 tablet 3   beta carotene w/minerals (OCUVITE) tablet Take 1 tablet by mouth daily. Unknown strength     chlorhexidine (PERIDEX) 0.12 % solution Use as directed 15 mLs in the mouth or throat 2 (two) times daily.     clobetasol ointment (TEMOVATE) 0.05 % APPLY TOPICALLY TWICE DAILY (Patient taking differently: Apply 1 application  topically 2 (two) times daily.) 30 g 1   cyclobenzaprine (FLEXERIL) 5 MG tablet Take 1 tablet (5 mg total) by mouth 3 (three) times daily as needed. 40 tablet 1   dexamethasone (DECADRON) 4 MG tablet Take 8 mg by mouth daily.     furosemide (LASIX) 40 MG tablet Take 1 tablet (40 mg total) by mouth daily. (Patient taking differently: Take 20 mg by mouth daily.) 90 tablet 3   gabapentin (NEURONTIN) 100 MG capsule Take 1 capsule (100 mg total) by mouth 3 (three) times daily. 90 capsule 3   glucose blood (FREESTYLE LITE) test strip Use as instructed once daily E11.9 100 each 3   IBU 800 MG tablet Take 800 mg by mouth every 6 (six) hours as needed for mild pain or moderate pain.     ketoconazole (NIZORAL) 2 % cream Apply topically 2 (two) times daily as needed for irritation. 30 g 1   losartan (COZAAR) 25 MG tablet TAKE ONE (1) TABLET BY MOUTH TWO (2) TIMES DAILY (Patient taking differently: Take 25  mg by mouth daily. TAKE ONE (1) TABLET BY MOUTH TWO (2) TIMES DAILY) 180 tablet 2   metFORMIN (GLUCOPHAGE) 1000 MG tablet Take 0.5 tablets (500 mg total) by mouth 2 (two) times daily with a meal. 90 tablet 3   Multiple Vitamin (MULTIVITAMIN WITH MINERALS) TABS tablet Take 1 tablet by mouth daily. Unknown strength     predniSONE (DELTASONE) 10 MG tablet 3 tabs by mouth per day for 3 days,2tabs per day for 3 days,1tab per day for 3 days 18 tablet 0   sildenafil (VIAGRA) 100 MG tablet TAKE 1/2 TO 1 TABLET BY MOUTH DAILY AS NEEDED FOR ERECTILE DYSFUNCTION (Patient taking differently: Take 25-50 mg by mouth as needed for erectile dysfunction.) 5 tablet 11   traMADol (ULTRAM) 50 MG tablet Take 1 tablet (50 mg total) by mouth every 6 (six) hours as needed. 30 tablet 0   No current facility-administered medications for this visit.     Past Medical History:  Diagnosis Date   AAA (abdominal aortic aneurysm) ([place])    MONITORED BY DR LAWSON LOV JUNE 2014--  STABLE  AT 3.7   Abdominal aneurysm without mention of rupture 07/24/2012   Acute sinus infection 06/17/2009   Qualifier: Diagnosis of  By: Jenny Reichmann MD, Hunt Oris    Allergic rhinitis  Anxiety state 07/20/2007   Arthritis    Asymptomatic stenosis of left carotid artery without infarction    MILD DISEASE   Bilateral carotid artery stenosis    BPH (benign prostatic hypertrophy)    Coronary artery calcification seen on CT scan 09/05/2017   Diabetes mellitus type 2, noninsulin dependent ([place]) 07/20/2007   Dilated cardiomyopathy ([place]) 12/14/2018   Diverticulosis of colon    Hearing loss of both ears 10/23/2013   History of cardiomyopathy    IDIOPATHIC-- PER [facility] NOTE 2013 , CURRENTLY RESOLVED   History of kidney stones    HTN (hypertension) 01/14/2016   Hypercalcemia 03/15/2019   Hyperlipidemia    Hypertension    Hypertensive heart disease without CHF    Obesity (BMI 30-39.9)    Paresthesia of both feet 07/14/2016   Peripheral vascular disease ([place])     Personal history of kidney stones    Phimosis    PHN (postherpetic neuralgia) 09/18/2019   Preop exam for internal medicine 05/10/2016   Preventative health care 01/14/2016   Rash 05/01/2015   Right carotid artery occlusion    CHRONIC   WITHOUT INFARTION   Rotator cuff arthropathy of both shoulders 04/10/2019   Spinal stenosis at L4-L5 level 06/08/2016   Stroke ([place])     Right eye   Type 2 diabetes mellitus ([place])    Wears hearing aid     ROS:   All systems reviewed and negative except as noted in the HPI.   Past Surgical History:  Procedure Laterality Date   CARDIOVASCULAR STRESS TEST  JUNE 2008  DR TILLEY   CATARACT EXTRACTION W/ INTRAOCULAR LENS IMPLANT Right    CIRCUMCISION N/A 08/30/2012   Procedure: CIRCUMCISION ADULT;  Surgeon: Malka So, MD;  [facility];  Service: Urology;  Laterality: N/A;   CYSTO/  RIGHT URETERAL STENT PLACEMENT  10-16-2003   EXTRACORPOREAL SHOCK WAVE LITHOTRIPSY Right 2005   EYE SURGERY     JOINT REPLACEMENT Bilateral 2008  2009   Knee   LUMBAR LAMINECTOMY/DECOMPRESSION MICRODISCECTOMY Bilateral 06/08/2016   Procedure: Bilateral Microlumbar decompression L4-L5 and L5-S1;  Surgeon: Susa Day, MD;  Location: [facility];  Service: Orthopedics;  Laterality: Bilateral;  Requests 2 hours   TOTAL KNEE ARTHROPLASTY Bilateral RIGHT 10-08-2007;   LEFT 09-30-2008   TRANSTHORACIC ECHOCARDIOGRAM  10-21-2009   LVEF 50%     Family History  Problem Relation Age of Onset   Heart disease Mother        CHF  Before age 3   COPD Mother    Kidney disease Mother    Hypertension Mother    Asthma Mother    Depression Mother    ALS Father    Diabetes Father    Colon cancer Neg Hx      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      Employer: RETIRED    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  Tobacco Use   Smoking status: Former    Packs/day: 0.75    Years: 25.00    Total pack years: 18.75    Types: Cigarettes    Quit date: 07/25/1982    Years since quitting: 39.3   Smokeless tobacco: Never  Vaping Use  Vaping Use: Never used  Substance and Sexual Activity   Alcohol use: No    Alcohol/week: 0.0 standard drinks of alcohol    Comment: recovering alcoholic  +28BTD   Drug use: No   Sexual activity: Not on file  Other Topics Concern   Not on file  Social History Narrative   Not on file   Social Determinants of Health   Financial Resource Strain: Low Risk  (08/06/2021)   Overall Financial Resource Strain (CARDIA)    Difficulty of Paying Living Expenses: Not hard at all  Food Insecurity: No Food Insecurity (08/06/2021)   Hunger Vital Sign    Worried About Running Out of Food in the Last Year: Never true    Ran Out of Food in the Last Year: Never true  Transportation Needs: No Transportation Needs (08/06/2021)   PRAPARE - Hydrologist (Medical): No    Lack of Transportation (Non-Medical): No  Physical Activity: Sufficiently Active (08/06/2021)   Exercise Vital Sign    Days of Exercise per Week: 5 days    Minutes of Exercise per Session: 30 min  Stress: No Stress Concern Present (08/06/2021)   [place]    Feeling of Stress : Not at all  Social Connections: Moderately Integrated (08/06/2021)   Social Connection and Isolation Panel [NHANES]    Frequency of Communication with Friends and Family: More than three times a week    Frequency of Social Gatherings with Friends and Family: More than three times a week    Attends Religious Services: 1 to 4 times per year    Active Member of [employer] or Organizations: No    Attends Archivist Meetings: 1 to 4 times per year    Marital Status: Widowed   Intimate Partner Violence: Not At Risk (08/06/2021)   Humiliation, Afraid, Rape, and Kick questionnaire    Fear of Current or Ex-Partner: No    Emotionally Abused: No    Physically Abused: No    Sexually Abused: No     BP 112/78   Pulse (!) 57   Ht '5\' 11"'$  (1.803 m)   Wt 219 lb (99.3 kg)   SpO2 98%   BMI 30.54 kg/m   Physical Exam:  Well appearing NAD HEENT: Unremarkable Neck:  No JVD, no thyromegally Lymphatics:  No adenopathy Back:  No CVA tenderness Lungs:  Clear with no wheezes HEART:  Regular rate rhythm, no murmurs, no rubs, no clicks Abd:  soft, positive bowel sounds, no organomegally, no rebound, no guarding Ext:  2 plus pulses, no edema, no cyanosis, no clubbing Skin:  No rashes no nodules Neuro:  CN II through XII intact, motor grossly intact  EKG - NSR with PVC's  Assess/Plan:  PVC's - he is improved on low dose amio. He will continue 200 mg daily. When I see him back in a year we will reduce his dose. Diastolic CHF - this is improved on lasix. Continue low dose. I asked him to avoid salty foods.  Carleene Overlie Adaya Garmany,MD

## 2021-12-07 DIAGNOSIS — H34211 Partial retinal artery occlusion, right eye: Secondary | ICD-10-CM | POA: Diagnosis not present

## 2021-12-07 DIAGNOSIS — H35372 Puckering of macula, left eye: Secondary | ICD-10-CM | POA: Diagnosis not present

## 2021-12-07 DIAGNOSIS — H353113 Nonexudative age-related macular degeneration, right eye, advanced atrophic without subfoveal involvement: Secondary | ICD-10-CM | POA: Diagnosis not present

## 2021-12-07 DIAGNOSIS — H353122 Nonexudative age-related macular degeneration, left eye, intermediate dry stage: Secondary | ICD-10-CM | POA: Diagnosis not present

## 2022-01-14 ENCOUNTER — Other Ambulatory Visit: Payer: Self-pay | Admitting: Internal Medicine

## 2022-01-14 NOTE — Telephone Encounter (Signed)
Please refill as per office routine med refill policy (all routine meds to be refilled for 3 mo or monthly (per pt preference) up to one year from last visit, then month to month grace period for 3 mo, then further med refills will have to be denied) ? ?

## 2022-02-08 ENCOUNTER — Encounter: Payer: Self-pay | Admitting: Cardiology

## 2022-02-08 ENCOUNTER — Ambulatory Visit: Payer: Medicare HMO | Attending: Cardiology | Admitting: Cardiology

## 2022-02-08 VITALS — BP 136/84 | HR 54 | Ht 71.0 in | Wt 225.1 lb

## 2022-02-08 DIAGNOSIS — Z79899 Other long term (current) drug therapy: Secondary | ICD-10-CM

## 2022-02-08 DIAGNOSIS — R5382 Chronic fatigue, unspecified: Secondary | ICD-10-CM

## 2022-02-08 DIAGNOSIS — R0609 Other forms of dyspnea: Secondary | ICD-10-CM | POA: Diagnosis not present

## 2022-02-08 DIAGNOSIS — I6521 Occlusion and stenosis of right carotid artery: Secondary | ICD-10-CM

## 2022-02-08 DIAGNOSIS — I10 Essential (primary) hypertension: Secondary | ICD-10-CM

## 2022-02-08 DIAGNOSIS — I420 Dilated cardiomyopathy: Secondary | ICD-10-CM | POA: Diagnosis not present

## 2022-02-08 DIAGNOSIS — I6522 Occlusion and stenosis of left carotid artery: Secondary | ICD-10-CM | POA: Diagnosis not present

## 2022-02-08 DIAGNOSIS — I7143 Infrarenal abdominal aortic aneurysm, without rupture: Secondary | ICD-10-CM | POA: Diagnosis not present

## 2022-02-08 DIAGNOSIS — I493 Ventricular premature depolarization: Secondary | ICD-10-CM | POA: Diagnosis not present

## 2022-02-08 NOTE — Progress Notes (Unsigned)
Cardiology Office Note:    Date:  02/08/2022   ID:  [name], DOB 03-Apr-1934, MRN [id number]  PCP:  Biagio Borg, MD  Cardiologist:  Jenne Campus, MD    Referring MD: Biagio Borg, MD   Chief Complaint  Patient presents with   Follow-up  Doing fine  History of Present Illness:    [name] is a 88 y.o. male past medical history significant for abdominal ultrasound was measuring 37 mm of last check last year, remote coronary artery disease, cardiomyopathy with stable ejection fraction in the neighborhood of 40 to 45%, essential hypertension, diabetes, PVCs successfully suppressed with amiodarone. Comes today 2 months for follow-up.  Overall doing well.  Denies have any chest pain tightness squeezing pressure burning chest now palpitations no dizziness no passing out.  Past Medical History:  Diagnosis Date   AAA (abdominal aortic aneurysm) ([place])    MONITORED BY DR LAWSON LOV JUNE 2014--  STABLE  AT 3.7   Abdominal aneurysm without mention of rupture 07/24/2012   Acute sinus infection 06/17/2009   Qualifier: Diagnosis of  By: Jenny Reichmann MD, [place] Oris    Allergic rhinitis    Anxiety state 07/20/2007   Arthritis    Asymptomatic stenosis of left carotid artery without infarction    MILD DISEASE   Bilateral carotid artery stenosis    BPH (benign prostatic hypertrophy)    Coronary artery calcification seen on CT scan 09/05/2017   Diabetes mellitus type 2, noninsulin dependent ([place]) 07/20/2007   Dilated cardiomyopathy ([place]) 12/14/2018   Diverticulosis of colon    Hearing loss of both ears 10/23/2013   History of cardiomyopathy    IDIOPATHIC-- PER [facility] NOTE 2013 , CURRENTLY RESOLVED   History of kidney stones    HTN (hypertension) 01/14/2016   Hypercalcemia 03/15/2019   Hyperlipidemia    Hypertension    Hypertensive heart disease without CHF    Obesity (BMI 30-39.9)    Paresthesia of both feet 07/14/2016   Peripheral vascular disease ([place])    Personal history of kidney  stones    Phimosis    PHN (postherpetic neuralgia) 09/18/2019   Preop exam for internal medicine 05/10/2016   Preventative health care 01/14/2016   Rash 05/01/2015   Right carotid artery occlusion    CHRONIC   WITHOUT INFARTION   Rotator cuff arthropathy of both shoulders 04/10/2019   Spinal stenosis at L4-L5 level 06/08/2016   Stroke ([place])     Right eye   Type 2 diabetes mellitus ([place])    Wears hearing aid     Past Surgical History:  Procedure Laterality Date   CARDIOVASCULAR STRESS TEST  JUNE 2008  DR Wynonia Lawman   CATARACT EXTRACTION W/ INTRAOCULAR LENS IMPLANT Right    CIRCUMCISION N/A 08/30/2012   Procedure: CIRCUMCISION ADULT;  Surgeon: Malka So, MD;  Location: [facility];  Service: Urology;  Laterality: N/A;   CYSTO/  RIGHT URETERAL STENT PLACEMENT  10-16-2003   EXTRACORPOREAL SHOCK WAVE LITHOTRIPSY Right 2005   EYE SURGERY     JOINT REPLACEMENT Bilateral 2008  2009   Knee   LUMBAR LAMINECTOMY/DECOMPRESSION MICRODISCECTOMY Bilateral 06/08/2016   Procedure: Bilateral Microlumbar decompression L4-L5 and L5-S1;  Surgeon: Susa Day, MD;  Location: [facility];  Service: Orthopedics;  Laterality: Bilateral;  Requests 2 hours   TOTAL KNEE ARTHROPLASTY Bilateral RIGHT 10-08-2007;   LEFT 09-30-2008   TRANSTHORACIC ECHOCARDIOGRAM  10-21-2009   LVEF 50%    Current Medications: Current Meds  Medication Sig  amiodarone (PACERONE) 200 MG tablet Take 1 tablet (200 mg total) by mouth daily.   atorvastatin (LIPITOR) 80 MG tablet TAKE ONE (1) TABLET BY MOUTH EVERY DAY (Patient taking differently: Take 80 mg by mouth daily.)   beta carotene w/minerals (OCUVITE) tablet Take 1 tablet by mouth daily. Unknown strength   chlorhexidine (PERIDEX) 0.12 % solution Use as directed 15 mLs in the mouth or throat 2 (two) times daily.   clobetasol ointment (TEMOVATE) 0.05 % APPLY TOPICALLY TWICE DAILY (Patient taking differently: Apply 1 application  topically 2 (two) times daily.)   furosemide  (LASIX) 40 MG tablet Take 1 tablet (40 mg total) by mouth daily. (Patient taking differently: Take 20 mg by mouth daily.)   glipiZIDE (GLUCOTROL XL) 10 MG 24 hr tablet Take 10 mg by mouth daily.   glucose blood (FREESTYLE LITE) test strip Use as instructed once daily E11.9 (Patient taking differently: 1 each by Other route as needed for other (glucose). Use as instructed once daily E11.9)   IBU 800 MG tablet Take 800 mg by mouth every 6 (six) hours as needed for mild pain or moderate pain.   ketoconazole (NIZORAL) 2 % cream Apply topically 2 (two) times daily as needed for irritation. (Patient taking differently: Apply 1 Application topically 2 (two) times daily as needed for irritation.)   losartan (COZAAR) 25 MG tablet TAKE ONE (1) TABLET BY MOUTH TWO (2) TIMES DAILY (Patient taking differently: Take 25 mg by mouth daily. TAKE ONE (1) TABLET BY MOUTH TWO (2) TIMES DAILY)   metFORMIN (GLUCOPHAGE) 1000 MG tablet TAKE 0.5 TABLETS BY MOUTH TWICE DAILY WITH A MEAL (Patient taking differently: Take 500 mg by mouth 2 (two) times daily with a meal.)   Multiple Vitamin (MULTIVITAMIN WITH MINERALS) TABS tablet Take 1 tablet by mouth daily. Unknown strength   sildenafil (VIAGRA) 100 MG tablet TAKE 1/2 TO 1 TABLET BY MOUTH DAILY AS NEEDED FOR ERECTILE DYSFUNCTION (Patient taking differently: Take 25-50 mg by mouth as needed for erectile dysfunction.)   traMADol (ULTRAM) 50 MG tablet Take 1 tablet (50 mg total) by mouth every 6 (six) hours as needed. (Patient taking differently: Take 50 mg by mouth every 6 (six) hours as needed for moderate pain or severe pain.)   [DISCONTINUED] cyclobenzaprine (FLEXERIL) 5 MG tablet Take 1 tablet (5 mg total) by mouth 3 (three) times daily as needed. (Patient taking differently: Take 5 mg by mouth 3 (three) times daily as needed for muscle spasms.)   [DISCONTINUED] dexamethasone (DECADRON) 4 MG tablet Take 8 mg by mouth daily.   [DISCONTINUED] gabapentin (NEURONTIN) 100 MG capsule  Take 1 capsule (100 mg total) by mouth 3 (three) times daily.   [DISCONTINUED] predniSONE (DELTASONE) 10 MG tablet 3 tabs by mouth per day for 3 days,2tabs per day for 3 days,1tab per day for 3 days (Patient taking differently: Take 30 mg by mouth daily at 6 (six) AM. 3 tabs by mouth per day for 3 days,2tabs per day for 3 days,1tab per day for 3 days)     Allergies:   Patient has no known allergies.   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  Tobacco Use   Smoking status: Former    Packs/day: 0.75    Years: 25.00    Total pack years: 18.75    Types: Cigarettes    Quit date: 07/25/1982    Years since quitting: 39.5   Smokeless tobacco: Never  Vaping Use   Vaping Use: Never used  Substance and Sexual Activity   Alcohol use: No    Alcohol/week: 0.0 standard drinks of alcohol    Comment: recovering alcoholic  +[id number]   Drug use: No   Sexual activity: Not on file  Other Topics Concern   Not on file  Social History Narrative   Not on file   Social Determinants of Health   Financial Resource Strain: Low Risk  (08/06/2021)   Overall Financial Resource Strain (CARDIA)    Difficulty of Paying Living Expenses: Not hard at all  Food Insecurity: No Food Insecurity (08/06/2021)   Hunger Vital Sign    Worried About Running Out of Food in the Last Year: Never true    Ran Out of Food in the Last Year: Never true  Transportation Needs: No Transportation Needs (08/06/2021)   PRAPARE - Hydrologist (Medical): No    Lack of Transportation (Non-Medical): No  Physical Activity: Sufficiently Active (08/06/2021)   Exercise Vital Sign    Days of Exercise per Week: 5 days    Minutes of  Exercise per Session: 30 min  Stress: No Stress Concern Present (08/06/2021)   [place]    Feeling of Stress : Not at all  Social Connections: Moderately Integrated (08/06/2021)   Social Connection and Isolation Panel [NHANES]    Frequency of Communication with Friends and Family: More than three times a week    Frequency of Social Gatherings with Friends and Family: More than three times a week    Attends Religious Services: 1 to 4 times per year    Active Member of [employer] or Organizations: No    Attends Archivist Meetings: 1 to 4 times per year    Marital Status: Widowed     Family History: The patient's family history includes ALS in his father; Asthma in his mother; COPD in his mother; Depression in his mother; Diabetes in his father; Heart disease in his mother; Hypertension in his mother; Kidney disease in his mother. There is no history of Colon cancer. ROS:   Please see the history of present illness.    All 14 point review of systems negative except as described per history of present illness  EKGs/Labs/Other Studies Reviewed:      Recent Labs: 08/24/2021: ALT 33; BUN 19; Creatinine, Ser 1.02; Potassium 4.1; Sodium 140  Recent Lipid Panel    Component Value Date/Time   CHOL 123 08/24/2021 1009   CHOL 124 03/11/2020 1108   TRIG 88.0 08/24/2021 1009   HDL 42.80 08/24/2021 1009   HDL 41 03/11/2020 1108   CHOLHDL 3 08/24/2021 1009   VLDL 17.6 08/24/2021 1009   LDLCALC 62 08/24/2021 1009   LDLCALC 67 03/11/2020 1108   LDLDIRECT 92.0 02/06/2017 1119    Physical Exam:    VS:  BP 136/84 (BP Location: Left Arm, Patient Position: Sitting)   Pulse (!) 54   Ht '5\' 11"'$  (1.803 m)   Wt 225 lb 1.9 oz (102.1 kg)   SpO2 98%   BMI 31.40 kg/m     Wt Readings from Last 3 Encounters:  02/08/22 225 lb  1.9 oz (102.1 kg)  12/02/21 219 lb (99.3 kg)  10/27/21 220 lb 6 oz (100 kg)     GEN:  Well nourished,  well developed in no acute distress HEENT: Normal NECK: No JVD; No carotid bruits LYMPHATICS: No lymphadenopathy CARDIAC: RRR, no murmurs, no rubs, no gallops RESPIRATORY:  Clear to auscultation without rales, wheezing or rhonchi  ABDOMEN: Soft, non-tender, non-distended MUSCULOSKELETAL:  No edema; No deformity  SKIN: Warm and dry LOWER EXTREMITIES: no swelling NEUROLOGIC:  Alert and oriented x 3 PSYCHIATRIC:  Normal affect   ASSESSMENT:    1. Medication management   2. Infrarenal abdominal aortic aneurysm (AAA) without rupture ([place])   3. Asymptomatic stenosis of left carotid artery without infarction   4. Dilated cardiomyopathy ([place])   5. Primary hypertension   6. Right carotid artery occlusion   7. PVC's (premature ventricular contractions)    PLAN:    In order of problems listed above:  Infrarenal abdominal arctic aneurysm 37 mm of last check last year we will continue monitoring. ASymptomatic carotid artery stenosis and occlusion.  Stable no TIA CVA-like symptoms. History of cardiomyopathy with ejection fraction 40 to 45%.  We having difficulty putting him on appropriate medication because of bradycardia and hypotension.  However I will ask him to have echocardiogram repeated to recheck left ventricle ejection fraction and then will decide about potentially augmenting his medical therapy. Palpitations/PVCs.  He denies have any palpitations burden of PVCs decreased significantly.  Will ask him to have TSH as well as AST ALT done since he is taking amiodarone which I will continue. Dyslipidemia I did review his K PN which show me LDL 62 HDL of 42 he is on Lipitor 80 which I will continue    Medication Adjustments/Labs and Tests Ordered: Current medicines are reviewed at length with the patient today.  Concerns regarding medicines are outlined above.  Orders Placed This Encounter  Procedures   EKG 12-Lead   Medication changes: No orders of the defined types were placed in  this encounter.   Signed, Park Liter, MD, [facility] 02/08/2022 2:51 PM    [place]

## 2022-02-08 NOTE — Patient Instructions (Addendum)
Medication Instructions:  Your physician recommends that you continue on your current medications as directed. Please refer to the Current Medication list given to you today.  *If you need a refill on your cardiac medications before your next appointment, please call your pharmacy*   Lab Work: [address]nd [place] [address] AST, ALT, TSH- Today If you have labs (blood work) drawn today and your tests are completely normal, you will receive your results only by: [name] (if you have MyChart) OR A paper copy in the mail If you have any lab test that is abnormal or we need to change your treatment, we will call you to review the results.   Testing/Procedures: Your physician has requested that you have an echocardiogram. Echocardiography is a painless test that uses sound waves to create images of your heart. It provides your doctor with information about the size and shape of your heart and how well your heart's chambers and valves are working. This procedure takes approximately one hour. There are no restrictions for this procedure. Please do NOT wear cologne, perfume, aftershave, or lotions (deodorant is allowed). Please arrive 15 minutes prior to your appointment time.    Follow-Up: At [facility], you and your health needs are our priority.  As part of our continuing mission to provide you with exceptional heart care, we have created designated Provider Care Teams.  These Care Teams include your primary Cardiologist (physician) and Advanced Practice Providers (APPs -  Physician Assistants and Nurse Practitioners) who all work together to provide you with the care you need, when you need it.  We recommend signing up for the patient portal called "MyChart".  Sign up information is provided on this After Visit Summary.  MyChart is used to connect with patients for Virtual Visits (Telemedicine).  Patients are able to view lab/test results, encounter notes, upcoming appointments, etc.  Non-urgent  messages can be sent to your provider as well.   To learn more about what you can do with MyChart, go to [email or web address].    Your next appointment:   6 month(s)  The format for your next appointment:   In Person  Provider:   Jenne Campus, MD    Other Instructions NA

## 2022-02-09 LAB — ALT: ALT: 26 IU/L (ref 0–44)

## 2022-02-09 LAB — AST: AST: 22 IU/L (ref 0–40)

## 2022-02-09 LAB — TSH: TSH: 2.8 u[IU]/mL (ref 0.450–4.500)

## 2022-02-11 ENCOUNTER — Telehealth: Payer: Self-pay

## 2022-02-11 NOTE — Telephone Encounter (Signed)
Left message on My Chart with normal results per Dr. Krasowski's note 

## 2022-02-22 ENCOUNTER — Other Ambulatory Visit (INDEPENDENT_AMBULATORY_CARE_PROVIDER_SITE_OTHER): Payer: Medicare HMO

## 2022-02-22 DIAGNOSIS — E1165 Type 2 diabetes mellitus with hyperglycemia: Secondary | ICD-10-CM

## 2022-02-22 DIAGNOSIS — E538 Deficiency of other specified B group vitamins: Secondary | ICD-10-CM | POA: Diagnosis not present

## 2022-02-22 DIAGNOSIS — E559 Vitamin D deficiency, unspecified: Secondary | ICD-10-CM | POA: Diagnosis not present

## 2022-02-22 LAB — URINALYSIS, ROUTINE W REFLEX MICROSCOPIC
Bilirubin Urine: NEGATIVE
Hgb urine dipstick: NEGATIVE
Nitrite: NEGATIVE
RBC / HPF: NONE SEEN (ref 0–?)
Specific Gravity, Urine: 1.02 (ref 1.000–1.030)
Total Protein, Urine: NEGATIVE
Urine Glucose: NEGATIVE
Urobilinogen, UA: 4 — AB (ref 0.0–1.0)
pH: 7 (ref 5.0–8.0)

## 2022-02-22 LAB — HEPATIC FUNCTION PANEL
ALT: 27 U/L (ref 0–53)
AST: 20 U/L (ref 0–37)
Albumin: 4.1 g/dL (ref 3.5–5.2)
Alkaline Phosphatase: 67 U/L (ref 39–117)
Bilirubin, Direct: 0.2 mg/dL (ref 0.0–0.3)
Total Bilirubin: 0.9 mg/dL (ref 0.2–1.2)
Total Protein: 6.8 g/dL (ref 6.0–8.3)

## 2022-02-22 LAB — CBC WITH DIFFERENTIAL/PLATELET
Basophils Absolute: 0 10*3/uL (ref 0.0–0.1)
Basophils Relative: 0.5 % (ref 0.0–3.0)
Eosinophils Absolute: 0.2 10*3/uL (ref 0.0–0.7)
Eosinophils Relative: 2.3 % (ref 0.0–5.0)
HCT: 39.8 % (ref 39.0–52.0)
Hemoglobin: 13.7 g/dL (ref 13.0–17.0)
Lymphocytes Relative: 23.7 % (ref 12.0–46.0)
Lymphs Abs: 1.7 10*3/uL (ref 0.7–4.0)
MCHC: 34.4 g/dL (ref 30.0–36.0)
MCV: 96.7 fl (ref 78.0–100.0)
Monocytes Absolute: 0.4 10*3/uL (ref 0.1–1.0)
Monocytes Relative: 5.4 % (ref 3.0–12.0)
Neutro Abs: 4.8 10*3/uL (ref 1.4–7.7)
Neutrophils Relative %: 68.1 % (ref 43.0–77.0)
Platelets: 126 10*3/uL — ABNORMAL LOW (ref 150.0–400.0)
RBC: 4.12 Mil/uL — ABNORMAL LOW (ref 4.22–5.81)
RDW: 13.9 % (ref 11.5–15.5)
WBC: 7.1 10*3/uL (ref 4.0–10.5)

## 2022-02-22 LAB — BASIC METABOLIC PANEL
BUN: 15 mg/dL (ref 6–23)
CO2: 31 mEq/L (ref 19–32)
Calcium: 9.3 mg/dL (ref 8.4–10.5)
Chloride: 102 mEq/L (ref 96–112)
Creatinine, Ser: 1.08 mg/dL (ref 0.40–1.50)
GFR: 62.76 mL/min (ref >60.00)
Glucose, Bld: 104 mg/dL — ABNORMAL HIGH (ref 70–99)
Potassium: 4.6 mEq/L (ref 3.5–5.1)
Sodium: 140 mEq/L (ref 135–145)

## 2022-02-22 LAB — LIPID PANEL
Cholesterol: 116 mg/dL (ref 0–200)
HDL: 45.5 mg/dL (ref >39.00)
LDL Cholesterol: 58 mg/dL (ref 0–99)
NonHDL: 70.75
Total CHOL/HDL Ratio: 3
Triglycerides: 63 mg/dL (ref 0.0–149.0)
VLDL: 12.6 mg/dL (ref 0.0–40.0)

## 2022-02-22 LAB — MICROALBUMIN / CREATININE URINE RATIO
Creatinine,U: 125.2 mg/dL
Microalb Creat Ratio: 1.2 mg/g (ref 0.0–30.0)
Microalb, Ur: 1.5 mg/dL (ref 0.0–1.9)

## 2022-02-22 LAB — HEMOGLOBIN A1C: Hgb A1c MFr Bld: 5.8 % (ref 4.6–6.5)

## 2022-02-22 LAB — TSH: TSH: 2.19 u[IU]/mL (ref 0.35–5.50)

## 2022-02-22 LAB — VITAMIN B12: Vitamin B-12: 314 pg/mL (ref 211–911)

## 2022-02-22 LAB — VITAMIN D 25 HYDROXY (VIT D DEFICIENCY, FRACTURES): VITD: 35.11 ng/mL (ref 30.00–100.00)

## 2022-03-01 ENCOUNTER — Encounter: Payer: Self-pay | Admitting: Internal Medicine

## 2022-03-01 ENCOUNTER — Ambulatory Visit (INDEPENDENT_AMBULATORY_CARE_PROVIDER_SITE_OTHER): Payer: Medicare HMO | Admitting: Internal Medicine

## 2022-03-01 VITALS — BP 118/66 | HR 52 | Temp 97.8°F | Ht 71.0 in | Wt 224.0 lb

## 2022-03-01 DIAGNOSIS — E559 Vitamin D deficiency, unspecified: Secondary | ICD-10-CM | POA: Diagnosis not present

## 2022-03-01 DIAGNOSIS — I10 Essential (primary) hypertension: Secondary | ICD-10-CM

## 2022-03-01 DIAGNOSIS — Z0001 Encounter for general adult medical examination with abnormal findings: Secondary | ICD-10-CM | POA: Diagnosis not present

## 2022-03-01 DIAGNOSIS — J310 Chronic rhinitis: Secondary | ICD-10-CM | POA: Insufficient documentation

## 2022-03-01 DIAGNOSIS — E782 Mixed hyperlipidemia: Secondary | ICD-10-CM

## 2022-03-01 DIAGNOSIS — E1165 Type 2 diabetes mellitus with hyperglycemia: Secondary | ICD-10-CM

## 2022-03-01 MED ORDER — TRIAMCINOLONE ACETONIDE 55 MCG/ACT NA AERO: 2.0000 | INHALATION_SPRAY | Freq: Every day | NASAL | 12 refills | Status: DC

## 2022-03-01 NOTE — Assessment & Plan Note (Signed)

## 2022-03-01 NOTE — Assessment & Plan Note (Signed)
Mild to mod, for start nasacort asd,,  to f/u any worsening symptoms or concerns

## 2022-03-01 NOTE — Addendum Note (Signed)
Addended by: Biagio Borg on: 03/01/2022 08:10 PM   Modules accepted: Orders

## 2022-03-01 NOTE — Assessment & Plan Note (Signed)
Lab Results  Component Value Date   LDLCALC 58 02/22/2022   Stable, pt to continue current statin lipitor 80 mg

## 2022-03-01 NOTE — Progress Notes (Signed)
Patient ID: [name], male   DOB: November 05, 1934, 88 y.o.   MRN: [id number]         Chief Complaint:: wellness exam and allergies, dm, htn,  hld       HPI:  [name] is a 88 y.o. male here for wellness exam; declines covid booster, o/w up to date                        Also Pt denies chest pain, increased sob or doe, wheezing, orthopnea, PND, increased LE swelling, palpitations, dizziness or syncope.   Pt denies polydipsia, polyuria, or new focal neuro s/s.    Pt denies fever, wt loss, night sweats, loss of appetite, or other constitutional symptoms  Does have several wks ongoing nasal allergy symptoms with clearish congestion, itch and sneezing, without fever, pain, ST, cough, swelling or wheezing.     Wt Readings from Last 3 Encounters:  03/01/22 224 lb (101.6 kg)  02/08/22 225 lb 1.9 oz (102.1 kg)  12/02/21 219 lb (99.3 kg)   BP Readings from Last 3 Encounters:  03/01/22 118/66  02/08/22 136/84  12/02/21 112/78   Immunization History  Administered Date(s) Administered   Fluad Quad(high Dose 65+) 12/14/2018, 10/27/2021   Influenza Whole 12/08/2008   Influenza, High Dose Seasonal PF 11/02/2017, 11/15/2019   Influenza,inj,Quad PF,6+ Mos 10/23/2013, 10/24/2014   Influenza-Unspecified 01/19/2021   Moderna Covid-19 Vaccine Bivalent Booster 54yr & up 11/02/2020   PFIZER(Purple Top)SARS-COV-2 Vaccination 04/08/2019, 05/09/2019, 12/11/2019, 11/02/2020   Pneumococcal Conjugate-13 04/19/2013   Pneumococcal Polysaccharide-23 08/22/2008   Td 02/07/1993, 08/22/2008   Tdap 09/12/2018   Zoster Recombinat (Shingrix) 05/24/2017, 09/26/2017   Health Maintenance Due  Topic Date Due   COVID-19 Vaccine (5 - 2023-24 season) 10/08/2021      Past Medical History:  Diagnosis Date   AAA (abdominal aortic aneurysm) ([place]    MONITORED BY DR LKellie SimmeringLOV JUNE 2014--  STABLE  AT 3.7   Abdominal aneurysm without mention of rupture 07/24/2012   Acute sinus infection 06/17/2009   Qualifier:  Diagnosis of  By: JJenny ReichmannMD, JHunt Oris   Allergic rhinitis    Anxiety state 07/20/2007   Arthritis    Asymptomatic stenosis of left carotid artery without infarction    MILD DISEASE   Bilateral carotid artery stenosis    BPH (benign prostatic hypertrophy)    Coronary artery calcification seen on CT scan 09/05/2017   Diabetes mellitus type 2, noninsulin dependent ([place] 07/20/2007   Dilated cardiomyopathy ([place] 12/14/2018   Diverticulosis of colon    Hearing loss of both ears 10/23/2013   History of cardiomyopathy    IDIOPATHIC-- PER [facility] NOTE 2013 , CURRENTLY RESOLVED   History of kidney stones    HTN (hypertension) 01/14/2016   Hypercalcemia 03/15/2019   Hyperlipidemia    Hypertension    Hypertensive heart disease without CHF    Obesity (BMI 30-39.9)    Paresthesia of both feet 07/14/2016   Peripheral vascular disease ([place]    Personal history of kidney stones    Phimosis    PHN (postherpetic neuralgia) 09/18/2019   Preop exam for internal medicine 05/10/2016   Preventative health care 01/14/2016   Rash 05/01/2015   Right carotid artery occlusion    CHRONIC   WITHOUT INFARTION   Rotator cuff arthropathy of both shoulders 04/10/2019   Spinal stenosis at L4-L5 level 06/08/2016   Stroke ([place]     Right eye   Type 2 diabetes  mellitus ([place])    Wears hearing aid    Past Surgical History:  Procedure Laterality Date   CARDIOVASCULAR STRESS TEST  JUNE 2008  DR TILLEY   CATARACT EXTRACTION W/ INTRAOCULAR LENS IMPLANT Right    CIRCUMCISION N/A 08/30/2012   Procedure: CIRCUMCISION ADULT;  Surgeon: Malka So, MD;  Location: [facility];  Service: Urology;  Laterality: N/A;   CYSTO/  RIGHT URETERAL STENT PLACEMENT  10-16-2003   EXTRACORPOREAL SHOCK WAVE LITHOTRIPSY Right 2005   EYE SURGERY     JOINT REPLACEMENT Bilateral 2008  2009   Knee   LUMBAR LAMINECTOMY/DECOMPRESSION MICRODISCECTOMY Bilateral 06/08/2016   Procedure: Bilateral Microlumbar decompression L4-L5 and L5-S1;  Surgeon:  Susa Day, MD;  Location: [facility];  Service: Orthopedics;  Laterality: Bilateral;  Requests 2 hours   TOTAL KNEE ARTHROPLASTY Bilateral RIGHT 10-08-2007;   LEFT 09-30-2008   TRANSTHORACIC ECHOCARDIOGRAM  10-21-2009   LVEF 50%    reports that he quit smoking about 39 years ago. His smoking use included cigarettes. He has a 18.75 pack-year smoking history. He has never used smokeless tobacco. He reports that he does not drink alcohol and does not use drugs. family history includes ALS in his father; Asthma in his mother; COPD in his mother; Depression in his mother; Diabetes in his father; Heart disease in his mother; Hypertension in his mother; Kidney disease in his mother. No Known Allergies Current Outpatient Medications on File Prior to Visit  Medication Sig Dispense Refill   amiodarone (PACERONE) 200 MG tablet Take 1 tablet (200 mg total) by mouth daily. 90 tablet 3   atorvastatin (LIPITOR) 80 MG tablet TAKE ONE (1) TABLET BY MOUTH EVERY DAY (Patient taking differently: Take 80 mg by mouth daily.) 90 tablet 3   beta carotene w/minerals (OCUVITE) tablet Take 1 tablet by mouth daily. Unknown strength     clobetasol ointment (TEMOVATE) 0.05 % APPLY TOPICALLY TWICE DAILY (Patient taking differently: Apply 1 application  topically 2 (two) times daily.) 30 g 1   furosemide (LASIX) 40 MG tablet Take 1 tablet (40 mg total) by mouth daily. (Patient taking differently: Take 20 mg by mouth daily.) 90 tablet 3   glipiZIDE (GLUCOTROL XL) 10 MG 24 hr tablet Take 10 mg by mouth daily.     glucose blood (FREESTYLE LITE) test strip Use as instructed once daily E11.9 (Patient taking differently: 1 each by Other route as needed for other (glucose). Use as instructed once daily E11.9) 100 each 3   IBU 800 MG tablet Take 800 mg by mouth every 6 (six) hours as needed for mild pain or moderate pain.     ketoconazole (NIZORAL) 2 % cream Apply topically 2 (two) times daily as needed for irritation. (Patient taking  differently: Apply 1 Application topically 2 (two) times daily as needed for irritation.) 30 g 1   losartan (COZAAR) 25 MG tablet TAKE ONE (1) TABLET BY MOUTH TWO (2) TIMES DAILY (Patient taking differently: Take 25 mg by mouth daily. TAKE ONE (1) TABLET BY MOUTH TWO (2) TIMES DAILY) 180 tablet 2   metFORMIN (GLUCOPHAGE) 1000 MG tablet TAKE 0.5 TABLETS BY MOUTH TWICE DAILY WITH A MEAL (Patient taking differently: Take 500 mg by mouth 2 (two) times daily with a meal.) 90 tablet 3   Multiple Vitamin (MULTIVITAMIN WITH MINERALS) TABS tablet Take 1 tablet by mouth daily. Unknown strength     sildenafil (VIAGRA) 100 MG tablet TAKE 1/2 TO 1 TABLET BY MOUTH DAILY AS NEEDED FOR ERECTILE  DYSFUNCTION (Patient taking differently: Take 25-50 mg by mouth as needed for erectile dysfunction.) 5 tablet 11   traMADol (ULTRAM) 50 MG tablet Take 1 tablet (50 mg total) by mouth every 6 (six) hours as needed. (Patient taking differently: Take 50 mg by mouth every 6 (six) hours as needed for moderate pain or severe pain.) 30 tablet 0   No current facility-administered medications on file prior to visit.        ROS:  All others reviewed and negative.  Objective        PE:  BP 118/66 (BP Location: Left Arm, Patient Position: Sitting, Cuff Size: Large)   Pulse (!) 52   Temp 97.8 F (36.6 C) (Oral)   Ht '5\' 11"'$  (1.803 m)   Wt 224 lb (101.6 kg)   SpO2 98%   BMI 31.24 kg/m                 Constitutional: Pt appears in NAD               HENT: Head: NCAT.                Right Ear: External ear normal.                 Left Ear: External ear normal.                Eyes: . Pupils are equal, round, and reactive to light. Conjunctivae and EOM are normal               Nose: without d/c or deformity               Neck: Neck supple. Gross normal ROM               Cardiovascular: Normal rate and regular rhythm.                 Pulmonary/Chest: Effort normal and breath sounds without rales or wheezing.                Abd:   Soft, NT, ND, + BS, no organomegaly               Neurological: Pt is alert. At baseline orientation, motor grossly intact               Skin: Skin is warm. No rashes, no other new lesions, LE edema - none               Psychiatric: Pt behavior is normal without agitation   Micro: none  Cardiac tracings I have personally interpreted today:  none  Pertinent Radiological findings (summarize): none   Lab Results  Component Value Date   WBC 7.1 02/22/2022   HGB 13.7 02/22/2022   HCT 39.8 02/22/2022   PLT 126.0 (L) 02/22/2022   GLUCOSE 104 (H) 02/22/2022   CHOL 116 02/22/2022   TRIG 63.0 02/22/2022   HDL 45.50 02/22/2022   LDLDIRECT 92.0 02/06/2017   LDLCALC 58 02/22/2022   ALT 27 02/22/2022   AST 20 02/22/2022   NA 140 02/22/2022   K 4.6 02/22/2022   CL 102 02/22/2022   CREATININE 1.08 02/22/2022   BUN 15 02/22/2022   CO2 31 02/22/2022   TSH 2.19 02/22/2022   PSA 2.15 07/14/2016   INR 1.1 09/24/2008   HGBA1C 5.8 02/22/2022   MICROALBUR 1.5 02/22/2022   Assessment/Plan:  [name] is a 88 y.o. White or Caucasian [1] male with  has a past medical history of AAA (abdominal aortic aneurysm) ([place]), Abdominal aneurysm without mention of rupture (07/24/2012), Acute sinus infection (06/17/2009), Allergic rhinitis, Anxiety state (07/20/2007), Arthritis, Asymptomatic stenosis of left carotid artery without infarction, Bilateral carotid artery stenosis, BPH (benign prostatic hypertrophy), Coronary artery calcification seen on CT scan (09/05/2017), Diabetes mellitus type 2, noninsulin dependent ([place]) (07/20/2007), Dilated cardiomyopathy ([place]) (12/14/2018), Diverticulosis of colon, Hearing loss of both ears (10/23/2013), History of cardiomyopathy, History of kidney stones, HTN (hypertension) (01/14/2016), Hypercalcemia (03/15/2019), Hyperlipidemia, Hypertension, Hypertensive heart disease without CHF, Obesity (BMI 30-39.9), Paresthesia of both feet (07/14/2016), Peripheral vascular disease ([place]),  Personal history of kidney stones, Phimosis, PHN (postherpetic neuralgia) (09/18/2019), Preop exam for internal medicine (05/10/2016), Preventative health care (01/14/2016), Rash (05/01/2015), Right carotid artery occlusion, Rotator cuff arthropathy of both shoulders (04/10/2019), Spinal stenosis at L4-L5 level (06/08/2016), Stroke ([place]), Type 2 diabetes mellitus ([place]), and Wears hearing aid.  Encounter for well adult exam with abnormal findings Age and sex appropriate education and counseling updated with regular exercise and diet Referrals for preventative services - none needed Immunizations addressed - declines covid booster Smoking counseling  - none needed Evidence for depression or other mood disorder - none significant Most recent labs reviewed. I have personally reviewed and have noted: 1) the patient's medical and social history 2) The patient's current medications and supplements 3) The patient's height, weight, and BMI have been recorded in the chart   HTN (hypertension) BP Readings from Last 3 Encounters:  03/01/22 118/66  02/08/22 136/84  12/02/21 112/78   Stable, pt to continue medical treatment losartan 25 bid   Hyperlipidemia Lab Results  Component Value Date   LDLCALC 58 02/22/2022   Stable, pt to continue current statin lipitor 80 mg   Rhinitis Mild to mod, for start nasacort asd,,  to f/u any worsening symptoms or concerns  Type 2 diabetes mellitus ([place]) Lab Results  Component Value Date   HGBA1C 5.8 02/22/2022   Stable, pt to continue current medical treatment lgucotrol xl 10 qd, metformin 500 bid  Followup: Return in about 6 months (around 08/30/2022).  Cathlean Cower, MD 03/01/2022 8:08 PM [facility]

## 2022-03-01 NOTE — Patient Instructions (Signed)
Please take all new medication as prescribed - the nasacort for the runny nose  Please continue all other medications as before, and refills have been done if requested.  Please have the pharmacy call with any other refills you may need.  Please continue your efforts at being more active, low cholesterol diet, and weight control.  You are otherwise up to date with prevention measures today.  Please keep your appointments with your specialists as you may have planned  Please make an Appointment to return in 6 months, or sooner if needed, also with Lab Appointment for testing done 3-5 days before at the [facility] (so this is for TWO appointments - please see the scheduling desk as you leave)

## 2022-03-01 NOTE — Assessment & Plan Note (Signed)
Lab Results  Component Value Date   HGBA1C 5.8 02/22/2022   Stable, pt to continue current medical treatment lgucotrol xl 10 qd, metformin 500 bid

## 2022-03-01 NOTE — Assessment & Plan Note (Signed)
BP Readings from Last 3 Encounters:  03/01/22 118/66  02/08/22 136/84  12/02/21 112/78   Stable, pt to continue medical treatment losartan 25 bid

## 2022-03-11 ENCOUNTER — Ambulatory Visit (HOSPITAL_COMMUNITY)
Admission: RE | Admit: 2022-03-11 | Discharge: 2022-03-11 | Disposition: A | Discharge status: Home / Self Care | Payer: Medicare HMO | Source: Ambulatory Visit | Attending: Cardiovascular Disease | Admitting: Cardiovascular Disease

## 2022-03-11 DIAGNOSIS — I714 Abdominal aortic aneurysm, without rupture, unspecified: Secondary | ICD-10-CM | POA: Diagnosis not present

## 2022-03-11 DIAGNOSIS — I7143 Infrarenal abdominal aortic aneurysm, without rupture: Secondary | ICD-10-CM | POA: Diagnosis not present

## 2022-03-14 ENCOUNTER — Ambulatory Visit (HOSPITAL_BASED_OUTPATIENT_CLINIC_OR_DEPARTMENT_OTHER)
Admission: RE | Admit: 2022-03-14 | Discharge: 2022-03-14 | Disposition: A | Discharge status: Home / Self Care | Payer: Medicare HMO | Source: Ambulatory Visit | Attending: Cardiology | Admitting: Cardiology

## 2022-03-14 DIAGNOSIS — R0609 Other forms of dyspnea: Secondary | ICD-10-CM | POA: Diagnosis not present

## 2022-03-14 DIAGNOSIS — R600 Localized edema: Secondary | ICD-10-CM | POA: Diagnosis not present

## 2022-03-14 LAB — ECHOCARDIOGRAM COMPLETE
AR max vel: 1.64 cm2
AV Area VTI: 1.6 cm2
AV Area mean vel: 1.77 cm2
AV Mean grad: 4 mmHg
AV Peak grad: 8.4 mmHg
Ao pk vel: 1.45 m/s
Area-P 1/2: 3.95 cm2
Calc EF: 36.4 %
S' Lateral: 4.8 cm
Single Plane A2C EF: 34.9 %
Single Plane A4C EF: 37 %

## 2022-03-14 MED ORDER — PERFLUTREN LIPID MICROSPHERE
1.0000 mL | INTRAVENOUS | Status: AC | PRN
  Administered 2022-03-14: 3 mL via INTRAVENOUS

## 2022-03-18 ENCOUNTER — Telehealth: Payer: Self-pay

## 2022-03-18 NOTE — Telephone Encounter (Signed)
-----   Message from Park Liter, MD sent at 03/15/2022  5:53 PM EST ----- Unchanged enlargement of the abdominal artery

## 2022-03-18 NOTE — Telephone Encounter (Signed)
Results given to the patient through my chart

## 2022-03-22 ENCOUNTER — Telehealth: Payer: Self-pay

## 2022-03-22 MED ORDER — ENTRESTO 24-26 MG PO TABS: 1.0000 | ORAL_TABLET | Freq: Two times a day (BID) | ORAL | 6 refills | Status: DC

## 2022-03-22 NOTE — Telephone Encounter (Signed)
Spoke with pt and left My Chart message with results per Dr. Wendy Poet note. Pt agreed to stop Losartan and start Entresto 24/26 and will come for blood work in 1 week.

## 2022-03-25 ENCOUNTER — Encounter: Payer: Self-pay | Admitting: Internal Medicine

## 2022-04-20 ENCOUNTER — Encounter: Payer: Self-pay | Admitting: Cardiology

## 2022-04-20 DIAGNOSIS — I420 Dilated cardiomyopathy: Secondary | ICD-10-CM

## 2022-04-22 MED ORDER — ENTRESTO 24-26 MG PO TABS: 1.0000 | ORAL_TABLET | Freq: Two times a day (BID) | ORAL | 6 refills | Status: DC

## 2022-05-04 LAB — BASIC METABOLIC PANEL
BUN/Creatinine Ratio: 15 (ref 10–24)
BUN: 17 mg/dL (ref 8–27)
CO2: 23 mmol/L (ref 20–29)
Calcium: 9.3 mg/dL (ref 8.6–10.2)
Chloride: 104 mmol/L (ref 96–106)
Creatinine, Ser: 1.12 mg/dL (ref 0.76–1.27)
Glucose: 106 mg/dL — ABNORMAL HIGH (ref 70–99)
Potassium: 5.3 mmol/L — ABNORMAL HIGH (ref 3.5–5.2)
Sodium: 144 mmol/L (ref 134–144)
eGFR: 64 mL/min/{1.73_m2} (ref >59)

## 2022-05-17 ENCOUNTER — Telehealth: Payer: Self-pay

## 2022-05-17 DIAGNOSIS — I10 Essential (primary) hypertension: Secondary | ICD-10-CM

## 2022-05-17 NOTE — Telephone Encounter (Signed)
-----   Message from Georgeanna Lea, MD sent at 05/06/2022 12:40 PM EDT ----- Chem-7 showed elevation of potassium only minimal but that prevent before increasing dosages of medication.  Lets recheck his Chem-7 in about 10 days

## 2022-05-17 NOTE — Telephone Encounter (Signed)
Patient notified of results and recommendations and agreed  with plan. Lab order on file.  °

## 2022-06-01 DIAGNOSIS — H35372 Puckering of macula, left eye: Secondary | ICD-10-CM | POA: Diagnosis not present

## 2022-06-01 DIAGNOSIS — H353113 Nonexudative age-related macular degeneration, right eye, advanced atrophic without subfoveal involvement: Secondary | ICD-10-CM | POA: Diagnosis not present

## 2022-06-01 DIAGNOSIS — H34211 Partial retinal artery occlusion, right eye: Secondary | ICD-10-CM | POA: Diagnosis not present

## 2022-06-01 DIAGNOSIS — H353122 Nonexudative age-related macular degeneration, left eye, intermediate dry stage: Secondary | ICD-10-CM | POA: Diagnosis not present

## 2022-06-01 DIAGNOSIS — E113293 Type 2 diabetes mellitus with mild nonproliferative diabetic retinopathy without macular edema, bilateral: Secondary | ICD-10-CM | POA: Diagnosis not present

## 2022-06-17 ENCOUNTER — Other Ambulatory Visit: Payer: Self-pay | Admitting: Internal Medicine

## 2022-06-22 ENCOUNTER — Telehealth: Payer: Self-pay

## 2022-06-22 NOTE — Telephone Encounter (Signed)
Contacted [name] to schedule their annual wellness visit. Appointment made for 07/05/22.  [name], CMA (AAMA)  [facility] [phone]

## 2022-07-05 ENCOUNTER — Ambulatory Visit (INDEPENDENT_AMBULATORY_CARE_PROVIDER_SITE_OTHER): Payer: Medicare HMO

## 2022-07-05 VITALS — Ht 71.0 in | Wt 220.0 lb

## 2022-07-05 DIAGNOSIS — Z0000 Encounter for general adult medical examination without abnormal findings: Secondary | ICD-10-CM | POA: Diagnosis not present

## 2022-07-05 NOTE — Patient Instructions (Addendum)
[name] , Thank you for taking time to come for your Medicare Wellness Visit. I appreciate your ongoing commitment to your health goals. Please review the following plan we discussed and let me know if I can assist you in the future.   These are the goals we discussed:  Goals      Patient declined health goal at this time.        This is a list of the screening recommended for you and due dates:  Health Maintenance  Topic Date Due   COVID-19 Vaccine (5 - 2023-24 season) 10/08/2021   Eye exam for diabetics  04/28/2022   Flu Shot  09/08/2022   Yearly kidney health urinalysis for diabetes  02/23/2023   Complete foot exam   03/02/2023   Yearly kidney function blood test for diabetes  05/03/2023   Medicare Annual Wellness Visit  07/05/2023   DTaP/Tdap/Td vaccine (4 - Td or Tdap) 09/11/2028   Pneumonia Vaccine  Completed   Zoster (Shingles) Vaccine  Completed   HPV Vaccine  Aged Out    Advanced directives: Yes; Please bring a copy of your health care power of attorney and living will to the office at your convenience.  Conditions/risks identified: Yes; Type II Diabetes  Next appointment: Follow up in one year for your annual wellness visit.   Preventive Care 86 Years and Older, Male  Preventive care refers to lifestyle choices and visits with your health care provider that can promote health and wellness. What does preventive care include? A yearly physical exam. This is also called an annual well check. Dental exams once or twice a year. Routine eye exams. Ask your health care provider how often you should have your eyes checked. Personal lifestyle choices, including: Daily care of your teeth and gums. Regular physical activity. Eating a healthy diet. Avoiding tobacco and drug use. Limiting alcohol use. Practicing safe sex. Taking low doses of aspirin every day. Taking vitamin and mineral supplements as recommended by your health care provider. What happens during an  annual well check? The services and screenings done by your health care provider during your annual well check will depend on your age, overall health, lifestyle risk factors, and family history of disease. Counseling  Your health care provider may ask you questions about your: Alcohol use. Tobacco use. Drug use. Emotional well-being. Home and relationship well-being. Sexual activity. Eating habits. History of falls. Memory and ability to understand (cognition). Work and work Astronomer. Screening  You may have the following tests or measurements: Height, weight, and BMI. Blood pressure. Lipid and cholesterol levels. These may be checked every 5 years, or more frequently if you are over 52 years old. Skin check. Lung cancer screening. You may have this screening every year starting at age 37 if you have a 30-pack-year history of smoking and currently smoke or have quit within the past 15 years. Fecal occult blood test (FOBT) of the stool. You may have this test every year starting at age 69. Flexible sigmoidoscopy or colonoscopy. You may have a sigmoidoscopy every 5 years or a colonoscopy every 10 years starting at age 51. Prostate cancer screening. Recommendations will vary depending on your family history and other risks. Hepatitis C blood test. Hepatitis B blood test. Sexually transmitted disease (STD) testing. Diabetes screening. This is done by checking your blood sugar (glucose) after you have not eaten for a while (fasting). You may have this done every 1-3 years. Abdominal aortic aneurysm (AAA) screening. You may need  this if you are a current or former smoker. Osteoporosis. You may be screened starting at age 32 if you are at high risk. Talk with your health care provider about your test results, treatment options, and if necessary, the need for more tests. Vaccines  Your health care provider may recommend certain vaccines, such as: Influenza vaccine. This is recommended  every year. Tetanus, diphtheria, and acellular pertussis (Tdap, Td) vaccine. You may need a Td booster every 10 years. Zoster vaccine. You may need this after age 49. Pneumococcal 13-valent conjugate (PCV13) vaccine. One dose is recommended after age 79. Pneumococcal polysaccharide (PPSV23) vaccine. One dose is recommended after age 62. Talk to your health care provider about which screenings and vaccines you need and how often you need them. This information is not intended to replace advice given to you by your health care provider. Make sure you discuss any questions you have with your health care provider. Document Released: 02/20/2015 Document Revised: 10/14/2015 Document Reviewed: 11/25/2014 Elsevier Interactive Patient Education  2017 [employer].  Fall Prevention in the Home Falls can cause injuries. They can happen to people of all ages. There are many things you can do to make your home safe and to help prevent falls. What can I do on the outside of my home? Regularly fix the edges of walkways and driveways and fix any cracks. Remove anything that might make you trip as you walk through a door, such as a raised step or threshold. Trim any bushes or trees on the path to your home. Use bright outdoor lighting. Clear any walking paths of anything that might make someone trip, such as rocks or tools. Regularly check to see if handrails are loose or broken. Make sure that both sides of any steps have handrails. Any raised decks and porches should have guardrails on the edges. Have any leaves, snow, or ice cleared regularly. Use sand or salt on walking paths during winter. Clean up any spills in your garage right away. This includes oil or grease spills. What can I do in the bathroom? Use night lights. Install grab bars by the toilet and in the tub and shower. Do not use towel bars as grab bars. Use non-skid mats or decals in the tub or shower. If you need to sit down in the shower,  use a plastic, non-slip stool. Keep the floor dry. Clean up any water that spills on the floor as soon as it happens. Remove soap buildup in the tub or shower regularly. Attach bath mats securely with double-sided non-slip rug tape. Do not have throw rugs and other things on the floor that can make you trip. What can I do in the bedroom? Use night lights. Make sure that you have a light by your bed that is easy to reach. Do not use any sheets or blankets that are too big for your bed. They should not hang down onto the floor. Have a firm chair that has side arms. You can use this for support while you get dressed. Do not have throw rugs and other things on the floor that can make you trip. What can I do in the kitchen? Clean up any spills right away. Avoid walking on wet floors. Keep items that you use a lot in easy-to-reach places. If you need to reach something above you, use a strong step stool that has a grab bar. Keep electrical cords out of the way. Do not use floor polish or wax that makes floors  slippery. If you must use wax, use non-skid floor wax. Do not have throw rugs and other things on the floor that can make you trip. What can I do with my stairs? Do not leave any items on the stairs. Make sure that there are handrails on both sides of the stairs and use them. Fix handrails that are broken or loose. Make sure that handrails are as long as the stairways. Check any carpeting to make sure that it is firmly attached to the stairs. Fix any carpet that is loose or worn. Avoid having throw rugs at the top or bottom of the stairs. If you do have throw rugs, attach them to the floor with carpet tape. Make sure that you have a light switch at the top of the stairs and the bottom of the stairs. If you do not have them, ask someone to add them for you. What else can I do to help prevent falls? Wear shoes that: Do not have high heels. Have rubber bottoms. Are comfortable and fit you  well. Are closed at the toe. Do not wear sandals. If you use a stepladder: Make sure that it is fully opened. Do not climb a closed stepladder. Make sure that both sides of the stepladder are locked into place. Ask someone to hold it for you, if possible. Clearly mark and make sure that you can see: Any grab bars or handrails. First and last steps. Where the edge of each step is. Use tools that help you move around (mobility aids) if they are needed. These include: Canes. Walkers. Scooters. Crutches. Turn on the lights when you go into a dark area. Replace any light bulbs as soon as they burn out. Set up your furniture so you have a clear path. Avoid moving your furniture around. If any of your floors are uneven, fix them. If there are any pets around you, be aware of where they are. Review your medicines with your doctor. Some medicines can make you feel dizzy. This can increase your chance of falling. Ask your doctor what other things that you can do to help prevent falls. This information is not intended to replace advice given to you by your health care provider. Make sure you discuss any questions you have with your health care provider. Document Released: 11/20/2008 Document Revised: 07/02/2015 Document Reviewed: 02/28/2014 Elsevier Interactive Patient Education  2017 [employer].

## 2022-07-05 NOTE — Progress Notes (Signed)
I connected with  [name] on 07/05/22 by a audio enabled telemedicine application and verified that I am speaking with the correct person using two identifiers.  Patient Location: Home  Provider Location: Office/Clinic  I discussed the limitations of evaluation and management by telemedicine. The patient expressed understanding and agreed to proceed.  Subjective:   [name] is a 88 y.o. male who presents for Medicare Annual/Subsequent preventive examination.  Review of Systems     Cardiac Risk Factors include: advanced age (>5men, >63 women);diabetes mellitus;dyslipidemia;family history of premature cardiovascular disease;hypertension;male gender;obesity (BMI >30kg/m2)     Objective:    Today's Vitals   07/05/22 1406  Weight: 220 lb (99.8 kg)  Height: 5\' 11"  (1.803 m)  PainSc: 5   PainLoc: Back   Body mass index is 30.68 kg/m.     07/05/2022    2:09 PM 08/06/2021   11:06 AM 05/27/2020   10:04 AM 08/31/2017   10:03 AM 08/17/2016    9:05 AM 06/08/2016    7:12 PM 06/03/2016    1:23 PM  Advanced Directives  Does Patient Have a Medical Advance Directive? Yes Yes Yes Yes Yes Yes No;Yes  Type of Estate agent of [place];Living will Healthcare Power of [place];Living will Living will;Healthcare Power of [employer] Power of [place];Living will Healthcare Power of [place];Living will Healthcare Power of [employer] of [place];Living will  Does patient want to make changes to medical advance directive?  No - Patient declined No - Patient declined   No - Patient declined No - Patient declined  Copy of Healthcare Power of Attorney in Chart? No - copy requested No - copy requested No - copy requested No - copy requested No - copy requested No - copy requested   Would patient like information on creating a medical advance directive?      No - Patient declined     Current Medications (verified) Outpatient Encounter Medications  as of 07/05/2022  Medication Sig   amiodarone (PACERONE) 200 MG tablet TAKE ONE (1) TABLET BY MOUTH EVERY DAY   atorvastatin (LIPITOR) 80 MG tablet TAKE ONE (1) TABLET BY MOUTH EVERY DAY (Patient taking differently: Take 80 mg by mouth daily.)   beta carotene w/minerals (OCUVITE) tablet Take 1 tablet by mouth daily. Unknown strength   clobetasol ointment (TEMOVATE) 0.05 % APPLY TOPICALLY TWICE DAILY (Patient taking differently: Apply 1 application  topically 2 (two) times daily.)   furosemide (LASIX) 40 MG tablet TAKE ONE (1) TABLET BY MOUTH EVERY DAY   glipiZIDE (GLUCOTROL XL) 10 MG 24 hr tablet Take 10 mg by mouth daily.   glucose blood (FREESTYLE LITE) test strip Use as instructed once daily E11.9 (Patient taking differently: 1 each by Other route as needed for other (glucose). Use as instructed once daily E11.9)   IBU 800 MG tablet Take 800 mg by mouth every 6 (six) hours as needed for mild pain or moderate pain.   ketoconazole (NIZORAL) 2 % cream Apply topically 2 (two) times daily as needed for irritation. (Patient taking differently: Apply 1 Application topically 2 (two) times daily as needed for irritation.)   metFORMIN (GLUCOPHAGE) 1000 MG tablet TAKE 0.5 TABLETS BY MOUTH TWICE DAILY WITH A MEAL (Patient taking differently: Take 500 mg by mouth 2 (two) times daily with a meal.)   Multiple Vitamin (MULTIVITAMIN WITH MINERALS) TABS tablet Take 1 tablet by mouth daily. Unknown strength   sacubitril-valsartan (ENTRESTO) 24-26 MG Take 1 tablet by mouth 2 (two)  times daily.   sildenafil (VIAGRA) 100 MG tablet TAKE 1/2 TO 1 TABLET BY MOUTH DAILY AS NEEDED FOR ERECTILE DYSFUNCTION (Patient taking differently: Take 25-50 mg by mouth as needed for erectile dysfunction.)   traMADol (ULTRAM) 50 MG tablet Take 1 tablet (50 mg total) by mouth every 6 (six) hours as needed. (Patient taking differently: Take 50 mg by mouth every 6 (six) hours as needed for moderate pain or severe pain.)   triamcinolone  (NASACORT) 55 MCG/ACT AERO nasal inhaler Place 2 sprays into the nose daily.   No facility-administered encounter medications on file as of 07/05/2022.    Allergies (verified) Patient has no known allergies.   History: Past Medical History:  Diagnosis Date   AAA (abdominal aortic aneurysm) (HCC)    MONITORED BY DR LAWSON LOV JUNE 2014--  STABLE  AT 3.7   Abdominal aneurysm without mention of rupture 07/24/2012   Acute sinus infection 06/17/2009   Qualifier: Diagnosis of  By: Jonny Ruiz MD, Len Blalock    Allergic rhinitis    Anxiety state 07/20/2007   Arthritis    Asymptomatic stenosis of left carotid artery without infarction    MILD DISEASE   Bilateral carotid artery stenosis    BPH (benign prostatic hypertrophy)    Coronary artery calcification seen on CT scan 09/05/2017   Diabetes mellitus type 2, noninsulin dependent (HCC) 07/20/2007   Dilated cardiomyopathy (HCC) 12/14/2018   Diverticulosis of colon    Hearing loss of both ears 10/23/2013   History of cardiomyopathy    IDIOPATHIC-- PER [facility] NOTE 2013 , CURRENTLY RESOLVED   History of kidney stones    HTN (hypertension) 01/14/2016   Hypercalcemia 03/15/2019   Hyperlipidemia    Hypertension    Hypertensive heart disease without CHF    Obesity (BMI 30-39.9)    Paresthesia of both feet 07/14/2016   Peripheral vascular disease (HCC)    Personal history of kidney stones    Phimosis    PHN (postherpetic neuralgia) 09/18/2019   Preop exam for internal medicine 05/10/2016   Preventative health care 01/14/2016   Rash 05/01/2015   Right carotid artery occlusion    CHRONIC   WITHOUT INFARTION   Rotator cuff arthropathy of both shoulders 04/10/2019   Spinal stenosis at L4-L5 level 06/08/2016   Stroke (HCC)     Right eye   Type 2 diabetes mellitus (HCC)    Wears hearing aid    Past Surgical History:  Procedure Laterality Date   CARDIOVASCULAR STRESS TEST  JUNE 2008  DR Donnie Aho   CATARACT EXTRACTION W/ INTRAOCULAR LENS IMPLANT Right    CIRCUMCISION  N/A 08/30/2012   Procedure: CIRCUMCISION ADULT;  Surgeon: Anner Crete, MD;  Location: [facility];  Service: Urology;  Laterality: N/A;   CYSTO/  RIGHT URETERAL STENT PLACEMENT  10-16-2003   EXTRACORPOREAL SHOCK WAVE LITHOTRIPSY Right 2005   EYE SURGERY     JOINT REPLACEMENT Bilateral 2008  2009   Knee   LUMBAR LAMINECTOMY/DECOMPRESSION MICRODISCECTOMY Bilateral 06/08/2016   Procedure: Bilateral Microlumbar decompression L4-L5 and L5-S1;  Surgeon: Jene Every, MD;  Location: [facility];  Service: Orthopedics;  Laterality: Bilateral;  Requests 2 hours   TOTAL KNEE ARTHROPLASTY Bilateral RIGHT 10-08-2007;   LEFT 09-30-2008   TRANSTHORACIC ECHOCARDIOGRAM  10-21-2009   LVEF 50%   Family History  Problem Relation Age of Onset   Heart disease Mother        CHF  Before age 81   COPD Mother    Kidney  disease Mother    Hypertension Mother    Asthma Mother    Depression Mother    ALS Father    Diabetes Father    Colon cancer Neg Hx    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  Tobacco Use   Smoking status: Former    Packs/day: 0.75    Years: 25.00    Additional pack years: 0.00    Total pack years: 18.75    Types: Cigarettes    Quit date: 07/25/1982    Years since quitting: 39.9   Smokeless tobacco: Never  Vaping Use   Vaping Use: Never used  Substance and Sexual Activity   Alcohol use: No    Alcohol/week: 0.0 standard drinks of alcohol    Comment: recovering alcoholic  +21yrs   Drug use: No   Sexual activity: Not on file  Other Topics Concern   Not on file  Social History Narrative   Not on file   Social Determinants of Health   Financial Resource Strain: Low Risk   (08/06/2021)   Overall Financial Resource Strain (CARDIA)    Difficulty of Paying Living Expenses: Not hard at all  Food Insecurity: No Food Insecurity (08/06/2021)   Hunger Vital Sign    Worried About Running Out of Food in the Last Year: Never true    Ran Out of Food in the Last Year: Never true  Transportation Needs: No Transportation Needs (08/06/2021)   PRAPARE - Administrator, Civil Service (Medical): No    Lack of Transportation (Non-Medical): No  Physical Activity: Sufficiently Active (08/06/2021)   Exercise Vital Sign    Days of Exercise per Week: 5 days    Minutes of Exercise per Session: 30 min  Stress: No Stress Concern Present (08/06/2021)   [employer] of Occupational Health - Occupational Stress Questionnaire    Feeling of Stress : Not at all  Social Connections: Moderately Integrated (08/06/2021)   Social Connection and Isolation Panel [NHANES]    Frequency of Communication with Friends and Family: More than three times a week    Frequency of Social Gatherings with Friends and Family: More than three times a week    Attends Religious Services: 1 to 4 times per year    Active Member of [employer] or Organizations: No    Attends Banker Meetings: 1 to 4 times per year    Marital Status: Widowed    Tobacco Counseling Counseling given: Not Answered   Clinical Intake:  Pre-visit preparation completed: Yes  Pain : 0-10 Pain Score: 5  Pain Type: Chronic pain Pain Location: Back Pain Descriptors / Indicators: Discomfort Pain Onset: More than a month ago     BMI - recorded: 30.68 Nutritional Risks: None Diabetes: Yes CBG done?: No Did pt. 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  No  Does the patient have any non-healing wounds?  No  Has the patient had any unintentional weight loss or weight gain?  No   Diabetes:  Is the patient diabetic?  Yes  If diabetic, was a CBG obtained today?  No  Did the patient bring in their glucometer from home?  No  How often do you monitor your CBG's? Once EOD.   Financial Strains and Diabetes Management:  Are you having any financial strains with the device, your supplies or your medication? No .  Does the patient want to be seen by Chronic Care Management for management of their diabetes?  No  Would the patient like to be referred to a Nutritionist or for Diabetic Management?  No   Diabetic Exams:  Diabetic Eye Exam: Overdue for diabetic eye exam. Pt has been advised about the importance in completing this exam. Patient advised to call and schedule an eye exam. Diabetic Foot Exam: Completed 03/01/2022   Interpreter Needed?: No  Information entered by :: Mazi Brailsford N. Zlata Alcaide, LPN.   Activities of Daily Living    07/05/2022    2:11 PM 08/06/2021   11:23 AM  In your present state of health, do you have any difficulty performing the following activities:  Hearing? 1 1  Vision? 0 0  Difficulty concentrating or making decisions? 0 0  Walking or climbing stairs? 0 0  Dressing or bathing? 0 0  Doing errands, shopping? 0 0  Preparing Food and eating ? N N  Using the Toilet? N N  In the past six months, have you accidently leaked urine? N N  Do you have problems with loss of bowel control? N N  Managing your Medications? N N  Managing your Finances? N N  Housekeeping or managing your Housekeeping? N N    Patient Care Team: Corwin Levins, MD as PCP - General Donnie Aho Vilinda Blanks, MD as Consulting Physician (Cardiology) Center, [facility] as Consulting Physician (Optometry)  Indicate any recent Medical Services you may have received from other than  [facility] providers in the past year (date may be approximate).     Assessment:   This is a routine wellness examination for [place].  Hearing/Vision screen Hearing Screening - Comments:: Patient has difficulty hearing; patient has hearing aids. Patient is deaf without them. Vision Screening - Comments:: Wears rx glasses - up to date with routine eye exams with [facility]   Dietary issues and exercise activities discussed: Current Exercise Habits: Home exercise routine;Structured exercise class, Type of exercise: walking;treadmill;stretching;strength training/weights;Other - see comments (golf course), Time (Minutes): 30, Frequency (Times/Week): 5, Weekly Exercise (Minutes/Week): 150, Intensity: Moderate, Exercise limited by: cardiac condition(s)   Goals Addressed   None   Depression Screen    07/05/2022    2:11 PM 03/01/2022    4:00 PM 03/01/2022    3:36 PM 10/27/2021    1:05 PM 08/26/2021    4:00 PM 08/06/2021   11:09 AM 02/15/2021    4:36 PM  PHQ 2/9 Scores  PHQ - 2 Score 0 0 0 0 0 0 0  PHQ- 9 Score 0  0 1       Fall Risk    07/05/2022    2:11 PM 03/01/2022    4:00 PM 03/01/2022    3:37 PM 10/27/2021    1:05 PM 08/26/2021    4:00 PM  Fall Risk   Falls in the past year? 0 0 0 0  0  Number falls in past yr: 0 0 0 0 0  Injury with Fall? 0 0 0 0 0  Risk for fall due to : No Fall Risks  No Fall Risks No Fall Risks   Follow up Falls prevention discussed  Falls evaluation completed Falls evaluation completed     FALL RISK PREVENTION PERTAINING TO THE HOME:  Any stairs in or around the home? No  If so, are there any without handrails? No  Home free of loose throw rugs in walkways, pet beds, electrical cords, etc? Yes  Adequate lighting in your home to reduce risk of falls? Yes   ASSISTIVE DEVICES UTILIZED TO PREVENT FALLS:  Life alert? Yes  Use of a cane, walker or w/c? No  Grab bars in the bathroom? Yes  Shower chair or bench in shower? Yes  Elevated toilet seat or a  handicapped toilet? Yes   TIMED UP AND GO:  Was the test performed? No . Telephonic Visit   Cognitive Function:        07/05/2022    2:18 PM 08/06/2021   11:26 AM  6CIT Screen  What Year? 0 points 0 points  What month? 0 points 0 points  What time? 0 points 0 points  Count back from 20 0 points 0 points  Months in reverse 0 points 0 points  Repeat phrase 0 points 0 points  Total Score 0 points 0 points    Immunizations Immunization History  Administered Date(s) Administered   Fluad Quad(high Dose 65+) 12/14/2018, 10/27/2021   Influenza Whole 12/08/2008   Influenza, High Dose Seasonal PF 11/02/2017, 11/15/2019   Influenza,inj,Quad PF,6+ Mos 10/23/2013, 10/24/2014   Influenza-Unspecified 01/19/2021   Moderna Covid-19 Vaccine Bivalent Booster 72yrs & up 11/02/2020   PFIZER(Purple Top)SARS-COV-2 Vaccination 04/08/2019, 05/09/2019, 12/11/2019, 11/02/2020   Pneumococcal Conjugate-13 04/19/2013   Pneumococcal Polysaccharide-23 08/22/2008   Td 02/07/1993, 08/22/2008   Tdap 09/12/2018   Zoster Recombinat (Shingrix) 05/24/2017, 09/26/2017    TDAP status: Up to date  Flu Vaccine status: Up to date  Pneumococcal vaccine status: Up to date  Covid-19 vaccine status: Completed vaccines  Qualifies for Shingles Vaccine? Yes   Zostavax completed No   Shingrix Completed?: Yes  Screening Tests Health Maintenance  Topic Date Due   COVID-19 Vaccine (5 - 2023-24 season) 10/08/2021   OPHTHALMOLOGY EXAM  04/28/2022   INFLUENZA VACCINE  09/08/2022   Diabetic kidney evaluation - Urine ACR  02/23/2023   FOOT EXAM  03/02/2023   Diabetic kidney evaluation - eGFR measurement  05/03/2023   Medicare Annual Wellness (AWV)  07/05/2023   DTaP/Tdap/Td (4 - Td or Tdap) 09/11/2028   Pneumonia Vaccine 32+ Years old  Completed   Zoster Vaccines- Shingrix  Completed   HPV VACCINES  Aged Out    Health Maintenance  Health Maintenance Due  Topic Date Due   COVID-19 Vaccine (5 - 2023-24  season) 10/08/2021   OPHTHALMOLOGY EXAM  04/28/2022    Colorectal cancer screening: No longer required.   Lung Cancer Screening: (Low Dose CT Chest recommended if Age 56-80 years, 30 pack-year currently smoking OR have quit w/in 15years.) does not qualify.   Lung Cancer Screening Referral: no  Additional Screening:  Hepatitis C Screening: does not qualify; Completed no  Vision Screening: Recommended annual ophthalmology exams for early detection of glaucoma and other disorders of the eye. Is the patient up to date with their annual eye exam?  No  Who is the provider or what is the name of  the office in which the patient attends annual eye exams? Velna Ochs, OD at [facility] If pt is not established with a provider, would they like to be referred to a provider to establish care? No .   Dental Screening: Recommended annual dental exams for proper oral hygiene  Community Resource Referral / Chronic Care Management: CRR required this visit?  No   CCM required this visit?  No      Plan:     I have personally reviewed and noted the following in the patient's chart:   Medical and social history Use of alcohol, tobacco or illicit drugs  Current medications and supplements including opioid prescriptions. Patient is not currently taking opioid prescriptions. Functional ability and status Nutritional status Physical activity Advanced directives List of other physicians Hospitalizations, surgeries, and ER visits in previous 12 months Vitals Screenings to include cognitive, depression, and falls Referrals and appointments  In addition, I have reviewed and discussed with patient certain preventive protocols, quality metrics, and best practice recommendations. A written personalized care plan for preventive services as well as general preventive health recommendations were provided to patient.     Mickeal Needy, LPN   04/30/4008   Nurse Notes: Normal cognitive status  assessed by direct observation via telephone conversation by this Nurse Health Advisor. No abnormalities found.

## 2022-07-26 ENCOUNTER — Ambulatory Visit: Payer: Medicare HMO | Attending: Cardiology | Admitting: Cardiology

## 2022-07-26 ENCOUNTER — Encounter: Payer: Self-pay | Admitting: Cardiology

## 2022-07-26 VITALS — BP 134/72 | HR 53 | Ht 71.0 in | Wt 227.0 lb

## 2022-07-26 DIAGNOSIS — I7143 Infrarenal abdominal aortic aneurysm, without rupture: Secondary | ICD-10-CM

## 2022-07-26 DIAGNOSIS — I10 Essential (primary) hypertension: Secondary | ICD-10-CM

## 2022-07-26 DIAGNOSIS — I420 Dilated cardiomyopathy: Secondary | ICD-10-CM

## 2022-07-26 MED ORDER — FUROSEMIDE 40 MG PO TABS: 20.0000 mg | ORAL_TABLET | Freq: Every day | ORAL | 3 refills | Status: DC

## 2022-07-26 MED ORDER — AMIODARONE HCL 200 MG PO TABS: 100.0000 mg | ORAL_TABLET | Freq: Every day | ORAL | 1 refills | Status: DC

## 2022-07-26 NOTE — Addendum Note (Signed)
Addended by: Baldo Ash D on: 07/26/2022 01:48 PM   Modules accepted: Orders

## 2022-07-26 NOTE — Patient Instructions (Signed)
Medication Instructions:   DECREASE: Amiodarone to 100mg  daily  DECREASE: Lasix to 20mg  daily   Lab Work: None Ordered If you have labs (blood work) drawn today and your tests are completely normal, you will receive your results only by: MyChart Message (if you have MyChart) OR A paper copy in the mail If you have any lab test that is abnormal or we need to change your treatment, we will call you to review the results.   Testing/Procedures: Your physician has requested that you have an abdominal aorta duplex. During this test, an ultrasound is used to evaluate the aorta. Allow 30 minutes for this exam. Do not eat after midnight the day before and avoid carbonated beverages    Follow-Up: At [facility], you and your health needs are our priority.  As part of our continuing mission to provide you with exceptional heart care, we have created designated Provider Care Teams.  These Care Teams include your primary Cardiologist (physician) and Advanced Practice Providers (APPs -  Physician Assistants and Nurse Practitioners) who all work together to provide you with the care you need, when you need it.  We recommend signing up for the patient portal called "MyChart".  Sign up information is provided on this After Visit Summary.  MyChart is used to connect with patients for Virtual Visits (Telemedicine).  Patients are able to view lab/test results, encounter notes, upcoming appointments, etc.  Non-urgent messages can be sent to your provider as well.   To learn more about what you can do with MyChart, go to [email or web address].    Your next appointment:   2 month(s)  The format for your next appointment:   In Person  Provider:   Gypsy Balsam, MD    Other Instructions NA

## 2022-07-26 NOTE — Progress Notes (Signed)
Cardiology Office Note:    Date:  07/26/2022   ID:  [name], DOB 26-Jan-1935, MRN [id number]  PCP:  Corwin Levins, MD  Cardiologist:  Gypsy Balsam, MD    Referring MD: Corwin Levins, MD   Chief Complaint  Patient presents with   Dizziness   Shortness of Breath    History of Present Illness:    [name] is a 88 y.o. male past medical history significant for cardiomyopathy recent echocardiogram showed ejection fraction 35% previously 4045%, prior gradually to put him on medication however problems orthostatic hypotension.  He also got abdominal arctic aneurysm measuring 37 mm, essential hypertension, diabetes, PVCs successfully suppressed with amiodarone. Comes today to months for follow-up overall doing fine he complained however of having dizziness upon getting up there is some shortness of breath with fatigue but at the same time he played golf on the regular basis have no difficulty doing it he get tired 1 that he got dizzy when the weather was very hot.  No chest pain tightness squeezing pressure burning chest  Past Medical History:  Diagnosis Date   AAA (abdominal aortic aneurysm) (HCC)    MONITORED BY DR Hart [place] LOV JUNE 2014--  STABLE  AT 3.7   Abdominal aneurysm without mention of rupture 07/24/2012   Acute sinus infection 06/17/2009   Qualifier: Diagnosis of  By: Jonny Ruiz MD, Len Blalock    Allergic rhinitis    Anxiety state 07/20/2007   Arthritis    Asymptomatic stenosis of left carotid artery without infarction    MILD DISEASE   Bilateral carotid artery stenosis    BPH (benign prostatic hypertrophy)    Coronary artery calcification seen on CT scan 09/05/2017   Diabetes mellitus type 2, noninsulin dependent (HCC) 07/20/2007   Dilated cardiomyopathy (HCC) 12/14/2018   Diverticulosis of colon    Hearing loss of both ears 10/23/2013   History of cardiomyopathy    IDIOPATHIC-- PER [facility] NOTE 2013 , CURRENTLY RESOLVED   History of kidney stones    HTN  (hypertension) 01/14/2016   Hypercalcemia 03/15/2019   Hyperlipidemia    Hypertension    Hypertensive heart disease without CHF    Obesity (BMI 30-39.9)    Paresthesia of both feet 07/14/2016   Peripheral vascular disease (HCC)    Personal history of kidney stones    Phimosis    PHN (postherpetic neuralgia) 09/18/2019   Preop exam for internal medicine 05/10/2016   Preventative health care 01/14/2016   Rash 05/01/2015   Right carotid artery occlusion    CHRONIC   WITHOUT INFARTION   Rotator cuff arthropathy of both shoulders 04/10/2019   Spinal stenosis at L4-L5 level 06/08/2016   Stroke (HCC)     Right eye   Type 2 diabetes mellitus (HCC)    Wears hearing aid     Past Surgical History:  Procedure Laterality Date   CARDIOVASCULAR STRESS TEST  JUNE 2008  DR Donnie Aho   CATARACT EXTRACTION W/ INTRAOCULAR LENS IMPLANT Right    CIRCUMCISION N/A 08/30/2012   Procedure: CIRCUMCISION ADULT;  Surgeon: Anner Crete, MD;  Location: [facility];  Service: Urology;  Laterality: N/A;   CYSTO/  RIGHT URETERAL STENT PLACEMENT  10-16-2003   EXTRACORPOREAL SHOCK WAVE LITHOTRIPSY Right 2005   EYE SURGERY     JOINT REPLACEMENT Bilateral 2008  2009   Knee   LUMBAR LAMINECTOMY/DECOMPRESSION MICRODISCECTOMY Bilateral 06/08/2016   Procedure: Bilateral Microlumbar decompression L4-L5 and L5-S1;  Surgeon: Jene Every, MD;  Location: [facility];  Service: Orthopedics;  Laterality: Bilateral;  Requests 2 hours   TOTAL KNEE ARTHROPLASTY Bilateral RIGHT 10-08-2007;   LEFT 09-30-2008   TRANSTHORACIC ECHOCARDIOGRAM  10-21-2009   LVEF 50%    Current Medications: Current Meds  Medication Sig   amiodarone (PACERONE) 200 MG tablet TAKE ONE (1) TABLET BY MOUTH EVERY DAY (Patient taking differently: Take 200 mg by mouth daily.)   atorvastatin (LIPITOR) 80 MG tablet TAKE ONE (1) TABLET BY MOUTH EVERY DAY (Patient taking differently: Take 80 mg by mouth daily.)   beta carotene w/minerals (OCUVITE) tablet Take 1  tablet by mouth daily. Unknown strength   clobetasol ointment (TEMOVATE) 0.05 % APPLY TOPICALLY TWICE DAILY (Patient taking differently: Apply 1 application  topically 2 (two) times daily.)   furosemide (LASIX) 40 MG tablet TAKE ONE (1) TABLET BY MOUTH EVERY DAY (Patient taking differently: Take 40 mg by mouth daily.)   glipiZIDE (GLUCOTROL XL) 10 MG 24 hr tablet Take 10 mg by mouth daily.   glucose blood (FREESTYLE LITE) test strip Use as instructed once daily E11.9 (Patient taking differently: 1 each by Other route as needed for other (glucose). Use as instructed once daily E11.9)   IBU 800 MG tablet Take 800 mg by mouth every 6 (six) hours as needed for mild pain or moderate pain.   ketoconazole (NIZORAL) 2 % cream Apply topically 2 (two) times daily as needed for irritation. (Patient taking differently: Apply 1 Application topically 2 (two) times daily as needed for irritation.)   metFORMIN (GLUCOPHAGE) 1000 MG tablet TAKE 0.5 TABLETS BY MOUTH TWICE DAILY WITH A MEAL (Patient taking differently: Take 500 mg by mouth 2 (two) times daily with a meal.)   Multiple Vitamin (MULTIVITAMIN WITH MINERALS) TABS tablet Take 1 tablet by mouth daily. Unknown strength   sacubitril-valsartan (ENTRESTO) 24-26 MG Take 1 tablet by mouth 2 (two) times daily.   sildenafil (VIAGRA) 100 MG tablet TAKE 1/2 TO 1 TABLET BY MOUTH DAILY AS NEEDED FOR ERECTILE DYSFUNCTION (Patient taking differently: Take 25-50 mg by mouth as needed for erectile dysfunction.)   traMADol (ULTRAM) 50 MG tablet Take 1 tablet (50 mg total) by mouth every 6 (six) hours as needed. (Patient taking differently: Take 50 mg by mouth every 6 (six) hours as needed for moderate pain or severe pain.)   triamcinolone (NASACORT) 55 MCG/ACT AERO nasal inhaler Place 2 sprays into the nose daily.     Allergies:   Patient has no known allergies.   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  Tobacco Use   Smoking status: Former    Packs/day: 0.75    Years: 25.00    Additional pack years: 0.00    Total pack years: 18.75    Types: Cigarettes    Quit date: 07/25/1982    Years since quitting: 40.0   Smokeless tobacco: Never  Vaping Use   Vaping Use: Never used  Substance and Sexual Activity   Alcohol use: No    Alcohol/week: 0.0 standard drinks of alcohol    Comment: recovering alcoholic  +45yrs   Drug use: No   Sexual activity: Not on file  Other Topics Concern   Not on file  Social History Narrative   Not on file   Social Determinants of Health   Financial Resource Strain: Low Risk  (08/06/2021)   Overall Financial Resource Strain (CARDIA)    Difficulty of Paying Living Expenses: Not hard at all  Food Insecurity: No Food Insecurity (08/06/2021)   Hunger Vital Sign    Worried About Running Out of Food in the Last Year: Never true    Ran Out of Food in the Last Year: Never true  Transportation Needs: No Transportation Needs (08/06/2021)   PRAPARE - Administrator, Civil Service (Medical): No    Lack of Transportation (Non-Medical): No  Physical Activity: Sufficiently Active (08/06/2021)   Exercise Vital Sign    Days of Exercise per Week: 5 days    Minutes of Exercise per Session: 30 min  Stress: No Stress Concern Present (08/06/2021)   [employer] of Occupational Health - Occupational Stress Questionnaire    Feeling of Stress : Not at all  Social Connections: Moderately Integrated (08/06/2021)   Social Connection and Isolation Panel [NHANES]    Frequency of Communication with Friends and Family: More than three times a week    Frequency of Social Gatherings with Friends and Family: More than three times a week    Attends  Religious Services: 1 to 4 times per year    Active Member of [employer] or Organizations: No    Attends Banker Meetings: 1 to 4 times per year    Marital Status: Widowed     Family History: The patient's family history includes ALS in his father; Asthma in his mother; COPD in his mother; Depression in his mother; Diabetes in his father; Heart disease in his mother; Hypertension in his mother; Kidney disease in his mother. There is no history of Colon cancer. ROS:   Please see the history of present illness.    All 14 point review of systems negative except as described per history of present illness  EKGs/Labs/Other Studies Reviewed:      Recent Labs: 02/22/2022: ALT 27; Hemoglobin 13.7; Platelets 126.0; TSH 2.19 05/03/2022: BUN 17; Creatinine, Ser 1.12; Potassium 5.3; Sodium 144  Recent Lipid Panel    Component Value Date/Time   CHOL 116 02/22/2022 0948   CHOL 124 03/11/2020 1108   TRIG 63.0 02/22/2022 0948   HDL 45.50 02/22/2022 0948   HDL 41 03/11/2020 1108   CHOLHDL 3 02/22/2022 0948   VLDL 12.6 02/22/2022 0948   LDLCALC 58 02/22/2022 0948   LDLCALC 67 03/11/2020 1108   LDLDIRECT 92.0 02/06/2017 1119    Physical Exam:    VS:  BP 134/72 (BP Location: Left Arm, Patient Position: Bed low/side rails up)   Pulse (!) 53   Ht 5\' 11"  (1.803 m)   Wt 227 lb (103 kg)   SpO2 98%   BMI 31.66 kg/m     Wt Readings from Last 3 Encounters:  07/26/22 227 lb (103 kg)  07/05/22 220 lb (99.8 kg)  03/01/22 224 lb (101.6 kg)     GEN:  Well nourished, well developed in no acute distress HEENT: Normal NECK: No JVD; No carotid bruits LYMPHATICS: No lymphadenopathy CARDIAC: RRR, no murmurs, no rubs, no gallops RESPIRATORY:  Clear to auscultation without rales, wheezing or rhonchi  ABDOMEN: Soft, non-tender, non-distended MUSCULOSKELETAL:  No edema; No deformity  SKIN: Warm and dry LOWER EXTREMITIES: no swelling NEUROLOGIC:  Alert and oriented x 3 PSYCHIATRIC:  Normal  affect   ASSESSMENT:    1. Dilated cardiomyopathy (HCC)   2. Primary hypertension   3. Infrarenal abdominal aortic aneurysm (AAA) without rupture (HCC)    PLAN:    In order of problems listed above:  Dilated cardiomyopathy.  Trying to put him on appropriate medication but difficulty is his blood pressure being low.  He does not look like he is overloaded with fluid I will cut down his Lasix to only 20 mg daily we will continue with Entresto at present dose. Essential hypertension blood pressure elevated but he clearly got orthostatic hypotension so hopefully reduction of diuretics will help maintain his blood pressure which allowed me to improve his guidelines directed medical therapy for cardiomyopathy. Infrarenal aortic aneurysm, we will schedule him to have ultrasounds again.   Medication Adjustments/Labs and Tests Ordered: Current medicines are reviewed at length with the patient today.  Concerns regarding medicines are outlined above.  Orders Placed This Encounter  Procedures   EKG 12-Lead   Medication changes: No orders of the defined types were placed in this encounter.   Signed, Georgeanna Lea, MD, [facility] 07/26/2022 1:37 PM    [facility]

## 2022-07-27 DIAGNOSIS — H903 Sensorineural hearing loss, bilateral: Secondary | ICD-10-CM | POA: Diagnosis not present

## 2022-08-05 NOTE — Progress Notes (Unsigned)
[name] [address] [place] [address] Phone: [phone] Subjective:   [name], am serving as a scribe for Dr. Antoine Primas.  I'm seeing this patient by the request  of:  Corwin Levins, MD  CC: Back and hip pain  [id number]  Last seen in 2021 for shoulder pain   [name] is a 88 y.o. male coming in with complaint of chronic back and hip pain.  Past medical history is significant for spinal stenosis at L4-L5.  Patient states that his pain is near SI joint in R side. Likes to walk and golf and these activities increase his pain. At rest he does not have pain. Denies any radiating symptoms. Take IBU for pain management.    Recently did see the primary care provider over the course of last year and was ordered an MRI of the lumbar spine but has not been done.  Reviewing patient's imaging patient did have a CT chest with contrast in March 2023 showing the patient did have left main and three-vessel coronary artery disease as well as some ectasia of the ascending thoracic aorta.  Patient also had early signs noted of possible cirrhosis of the liver.  Patient appears to be scheduled for a another AAA duplex ultrasound on the fifth of this month.    Past Medical History:  Diagnosis Date   AAA (abdominal aortic aneurysm) (HCC)    MONITORED BY DR LAWSON LOV JUNE 2014--  STABLE  AT 3.7   Abdominal aneurysm without mention of rupture 07/24/2012   Acute sinus infection 06/17/2009   Qualifier: Diagnosis of  By: Jonny Ruiz MD, Len Blalock    Allergic rhinitis    Anxiety state 07/20/2007   Arthritis    Asymptomatic stenosis of left carotid artery without infarction    MILD DISEASE   Bilateral carotid artery stenosis    BPH (benign prostatic hypertrophy)    Coronary artery calcification seen on CT scan 09/05/2017   Diabetes mellitus type 2, noninsulin dependent (HCC) 07/20/2007   Dilated cardiomyopathy (HCC) 12/14/2018   Diverticulosis of colon     Hearing loss of both ears 10/23/2013   History of cardiomyopathy    IDIOPATHIC-- PER [facility] NOTE 2013 , CURRENTLY RESOLVED   History of kidney stones    HTN (hypertension) 01/14/2016   Hypercalcemia 03/15/2019   Hyperlipidemia    Hypertension    Hypertensive heart disease without CHF    Obesity (BMI 30-39.9)    Paresthesia of both feet 07/14/2016   Peripheral vascular disease (HCC)    Personal history of kidney stones    Phimosis    PHN (postherpetic neuralgia) 09/18/2019   Preop exam for internal medicine 05/10/2016   Preventative health care 01/14/2016   Rash 05/01/2015   Right carotid artery occlusion    CHRONIC   WITHOUT INFARTION   Rotator cuff arthropathy of both shoulders 04/10/2019   Spinal stenosis at L4-L5 level 06/08/2016   Stroke (HCC)     Right eye   Type 2 diabetes mellitus (HCC)    Wears hearing aid    Past Surgical History:  Procedure Laterality Date   CARDIOVASCULAR STRESS TEST  JUNE 2008  DR Donnie Aho   CATARACT EXTRACTION W/ INTRAOCULAR LENS IMPLANT Right    CIRCUMCISION N/A 08/30/2012   Procedure: CIRCUMCISION ADULT;  Surgeon: Anner Crete, MD;  Location: [facility];  Service: Urology;  Laterality: N/A;   CYSTO/  RIGHT URETERAL STENT PLACEMENT  10-16-2003  EXTRACORPOREAL SHOCK WAVE LITHOTRIPSY Right 2005   EYE SURGERY     JOINT REPLACEMENT Bilateral 2008  2009   Knee   LUMBAR LAMINECTOMY/DECOMPRESSION MICRODISCECTOMY Bilateral 06/08/2016   Procedure: Bilateral Microlumbar decompression L4-L5 and L5-S1;  Surgeon: Jene Every, MD;  Location: [facility];  Service: Orthopedics;  Laterality: Bilateral;  Requests 2 hours   TOTAL KNEE ARTHROPLASTY Bilateral RIGHT 10-08-2007;   LEFT 09-30-2008   TRANSTHORACIC ECHOCARDIOGRAM  10-21-2009   LVEF 50%   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  Tobacco Use   Smoking status: Former    Packs/day: 0.75    Years: 25.00    Additional pack years: 0.00    Total pack years: 18.75    Types: Cigarettes    Quit date: 07/25/1982    Years since quitting: 40.0   Smokeless tobacco: Never  Vaping Use   Vaping Use: Never used  Substance and Sexual Activity   Alcohol use: No    Alcohol/week: 0.0 standard drinks of alcohol    Comment: recovering alcoholic  +18yrs   Drug use: No   Sexual activity: Not on file  Other Topics Concern   Not on file  Social History Narrative   Not on file   Social Determinants of Health   Financial Resource Strain: Low Risk  (08/06/2021)   Overall Financial Resource Strain (CARDIA)    Difficulty of Paying Living Expenses: Not hard at all  Food Insecurity: No Food Insecurity (08/06/2021)   Hunger Vital Sign    Worried About Running Out of Food in the Last Year: Never true    Ran Out of Food in the Last Year: Never true  Transportation Needs: No Transportation Needs (08/06/2021)   PRAPARE - Administrator, Civil Service (Medical): No    Lack of Transportation (Non-Medical): No  Physical Activity: Sufficiently Active (08/06/2021)   Exercise Vital Sign    Days of Exercise per Week: 5 days    Minutes of Exercise per Session: 30 min  Stress: No Stress Concern Present (08/06/2021)   [employer] of Occupational Health - Occupational Stress Questionnaire    Feeling of Stress : Not at all  Social Connections: Moderately Integrated (08/06/2021)   Social Connection and Isolation Panel [NHANES]    Frequency of Communication with Friends and Family: More than three times a week    Frequency of Social Gatherings with Friends and Family: More than three times a week    Attends Religious Services: 1 to 4 times per year    Active Member of [employer] or Organizations: No    Attends  Banker Meetings: 1 to 4 times per year    Marital Status: Widowed   No Known Allergies Family History  Problem Relation Age of Onset   Heart disease Mother        CHF  Before age 34   COPD Mother    Kidney disease Mother    Hypertension Mother    Asthma Mother    Depression Mother    ALS Father    Diabetes Father    Colon cancer Neg Hx     Current Outpatient  Medications (Endocrine & Metabolic):    glipiZIDE (GLUCOTROL XL) 10 MG 24 hr tablet, Take 10 mg by mouth daily.   metFORMIN (GLUCOPHAGE) 1000 MG tablet, TAKE 0.5 TABLETS BY MOUTH TWICE DAILY WITH A MEAL (Patient taking differently: Take 500 mg by mouth 2 (two) times daily with a meal.)  Current Outpatient Medications (Cardiovascular):    amiodarone (PACERONE) 200 MG tablet, Take 0.5 tablets (100 mg total) by mouth daily.   atorvastatin (LIPITOR) 80 MG tablet, TAKE ONE (1) TABLET BY MOUTH EVERY DAY (Patient taking differently: Take 80 mg by mouth daily.)   furosemide (LASIX) 40 MG tablet, Take 0.5 tablets (20 mg total) by mouth daily.   sacubitril-valsartan (ENTRESTO) 24-26 MG, Take 1 tablet by mouth 2 (two) times daily.   sildenafil (VIAGRA) 100 MG tablet, TAKE 1/2 TO 1 TABLET BY MOUTH DAILY AS NEEDED FOR ERECTILE DYSFUNCTION (Patient taking differently: Take 25-50 mg by mouth as needed for erectile dysfunction.)  Current Outpatient Medications (Respiratory):    triamcinolone (NASACORT) 55 MCG/ACT AERO nasal inhaler, Place 2 sprays into the nose daily.  Current Outpatient Medications (Analgesics):    IBU 800 MG tablet, Take 800 mg by mouth every 6 (six) hours as needed for mild pain or moderate pain.   traMADol (ULTRAM) 50 MG tablet, Take 1 tablet (50 mg total) by mouth every 6 (six) hours as needed. (Patient taking differently: Take 50 mg by mouth every 6 (six) hours as needed for moderate pain or severe pain.)   Current Outpatient Medications (Other):    beta carotene w/minerals (OCUVITE) tablet, Take 1  tablet by mouth daily. Unknown strength   clobetasol ointment (TEMOVATE) 0.05 %, APPLY TOPICALLY TWICE DAILY (Patient taking differently: Apply 1 application  topically 2 (two) times daily.)   glucose blood (FREESTYLE LITE) test strip, Use as instructed once daily E11.9 (Patient taking differently: 1 each by Other route as needed for other (glucose). Use as instructed once daily E11.9)   ketoconazole (NIZORAL) 2 % cream, Apply topically 2 (two) times daily as needed for irritation. (Patient taking differently: Apply 1 Application topically 2 (two) times daily as needed for irritation.)   Multiple Vitamin (MULTIVITAMIN WITH MINERALS) TABS tablet, Take 1 tablet by mouth daily. Unknown strength   Reviewed prior external information including notes and imaging from  primary care provider As well as notes that were available from care everywhere and other healthcare systems.  Past medical history, social, surgical and family history all reviewed in electronic medical record.  No pertanent information unless stated regarding to the chief complaint.   Review of Systems:  No headache, visual changes, nausea, vomiting, diarrhea, constipation, dizziness, abdominal pain, skin rash, fevers, chills, night sweats, weight loss, swollen lymph nodes, body aches, joint swelling, chest pain, shortness of breath, mood changes. POSITIVE muscle aches, body aches  Objective  Blood pressure (!) 106/58, pulse (!) 55, height 5\' 11"  (1.803 m), weight 227 lb (103 kg), SpO2 96 %.   General: No apparent distress alert and oriented x3 mood and affect normal, dressed appropriately.  HEENT: Pupils equal, extraocular movements intact patient is hard of hearing Respiratory: Patient's speak in full sentences and does not appear short of breath  Cardiovascular: No lower extremity edema, non tender, no erythema  Patient back exam does have significant loss of lordosis.  Tightness noted on the right side of the paraspinal  musculature.  Tightness of the right hip flexor also noted.  Negative straight leg test noted.  Tenderness to palpation on the right  side of the back.   After verbal consent patient was prepped with alcohol swab and with a 21-gauge 2 inch needle injected into the right sacroiliac joint with a total of 1 cc of 0.5% Marcaine and 1 cc of Kenalog 40 mg/mL.  No blood loss.  Band-Aid placed postinjection instructions given.   Impression and Recommendations:     The above documentation has been reviewed and is accurate and complete Judi Saa, DO

## 2022-08-08 ENCOUNTER — Inpatient Hospital Stay (HOSPITAL_COMMUNITY): Admission: RE | Admit: 2022-08-08 | Payer: Medicare HMO | Source: Ambulatory Visit

## 2022-08-08 ENCOUNTER — Other Ambulatory Visit (HOSPITAL_COMMUNITY): Payer: Medicare HMO

## 2022-08-09 ENCOUNTER — Ambulatory Visit: Payer: Medicare HMO | Admitting: Family Medicine

## 2022-08-09 ENCOUNTER — Encounter: Payer: Self-pay | Admitting: Family Medicine

## 2022-08-09 ENCOUNTER — Ambulatory Visit (INDEPENDENT_AMBULATORY_CARE_PROVIDER_SITE_OTHER): Payer: Medicare HMO

## 2022-08-09 VITALS — BP 106/58 | HR 55 | Ht 71.0 in | Wt 227.0 lb

## 2022-08-09 DIAGNOSIS — M419 Scoliosis, unspecified: Secondary | ICD-10-CM | POA: Diagnosis not present

## 2022-08-09 DIAGNOSIS — M25552 Pain in left hip: Secondary | ICD-10-CM

## 2022-08-09 DIAGNOSIS — M25551 Pain in right hip: Secondary | ICD-10-CM | POA: Diagnosis not present

## 2022-08-09 DIAGNOSIS — M7918 Myalgia, other site: Secondary | ICD-10-CM | POA: Diagnosis not present

## 2022-08-09 DIAGNOSIS — M545 Low back pain, unspecified: Secondary | ICD-10-CM

## 2022-08-09 DIAGNOSIS — M5136 Other intervertebral disc degeneration, lumbar region: Secondary | ICD-10-CM | POA: Diagnosis not present

## 2022-08-09 DIAGNOSIS — M47818 Spondylosis without myelopathy or radiculopathy, sacral and sacrococcygeal region: Secondary | ICD-10-CM | POA: Diagnosis not present

## 2022-08-09 DIAGNOSIS — M160 Bilateral primary osteoarthritis of hip: Secondary | ICD-10-CM | POA: Diagnosis not present

## 2022-08-09 NOTE — Patient Instructions (Addendum)
Watch blood pressure Xray today SI jt injection today Exercises If not better in 2 weeks will consider MRI See me again in 2 months

## 2022-08-09 NOTE — Assessment & Plan Note (Signed)
Patient given injection and tolerated, discussed icing regimen and home exercises, discussed which activities to do and which ones to avoid.  Increase activity slowly over the course of next several weeks.  We discussed that there is a likelihood though that this is more secondary to spinal stenosis as well.  Will need to monitor.  May need to consider restarting other medications again such as gabapentin but with low blood pressure will hold at this moment.  Depending on this to advanced imaging would be the other aspect 2.  F follow-up again in 6 to 8 weeks otherwise.

## 2022-08-12 ENCOUNTER — Encounter (HOSPITAL_COMMUNITY): Payer: Self-pay

## 2022-08-12 ENCOUNTER — Ambulatory Visit (HOSPITAL_COMMUNITY)
Admission: RE | Admit: 2022-08-12 | Discharge: 2022-08-12 | Disposition: A | Discharge status: Home / Self Care | Payer: Medicare HMO | Source: Ambulatory Visit | Attending: Cardiology | Admitting: Cardiology

## 2022-08-12 DIAGNOSIS — I7143 Infrarenal abdominal aortic aneurysm, without rupture: Secondary | ICD-10-CM

## 2022-08-22 ENCOUNTER — Other Ambulatory Visit: Payer: Self-pay | Admitting: Internal Medicine

## 2022-08-24 ENCOUNTER — Other Ambulatory Visit: Payer: Medicare HMO

## 2022-08-24 DIAGNOSIS — E559 Vitamin D deficiency, unspecified: Secondary | ICD-10-CM | POA: Diagnosis not present

## 2022-08-24 DIAGNOSIS — E1165 Type 2 diabetes mellitus with hyperglycemia: Secondary | ICD-10-CM | POA: Diagnosis not present

## 2022-08-24 LAB — LIPID PANEL
Cholesterol: 135 mg/dL (ref 0–200)
HDL: 51.1 mg/dL (ref >39.00)
LDL Cholesterol: 65 mg/dL (ref 0–99)
NonHDL: 84.11
Total CHOL/HDL Ratio: 3
Triglycerides: 95 mg/dL (ref 0.0–149.0)
VLDL: 19 mg/dL (ref 0.0–40.0)

## 2022-08-24 LAB — BASIC METABOLIC PANEL
BUN: 21 mg/dL (ref 6–23)
CO2: 28 mEq/L (ref 19–32)
Calcium: 9.8 mg/dL (ref 8.4–10.5)
Chloride: 96 mEq/L (ref 96–112)
Creatinine, Ser: 1.1 mg/dL (ref 0.40–1.50)
GFR: 61.17 mL/min (ref >60.00)
Glucose, Bld: 105 mg/dL — ABNORMAL HIGH (ref 70–99)
Potassium: 4.5 mEq/L (ref 3.5–5.1)
Sodium: 130 mEq/L — ABNORMAL LOW (ref 135–145)

## 2022-08-24 LAB — HEMOGLOBIN A1C: Hgb A1c MFr Bld: 5.9 % (ref 4.6–6.5)

## 2022-08-24 LAB — HEPATIC FUNCTION PANEL
ALT: 22 U/L (ref 0–53)
AST: 18 U/L (ref 0–37)
Albumin: 4.4 g/dL (ref 3.5–5.2)
Alkaline Phosphatase: 68 U/L (ref 39–117)
Bilirubin, Direct: 0.2 mg/dL (ref 0.0–0.3)
Total Bilirubin: 1.1 mg/dL (ref 0.2–1.2)
Total Protein: 7.3 g/dL (ref 6.0–8.3)

## 2022-08-24 LAB — VITAMIN D 25 HYDROXY (VIT D DEFICIENCY, FRACTURES): VITD: 37.41 ng/mL (ref 30.00–100.00)

## 2022-08-30 ENCOUNTER — Ambulatory Visit (INDEPENDENT_AMBULATORY_CARE_PROVIDER_SITE_OTHER): Payer: Medicare HMO | Admitting: Internal Medicine

## 2022-08-30 ENCOUNTER — Encounter: Payer: Self-pay | Admitting: Internal Medicine

## 2022-08-30 VITALS — BP 124/80 | HR 55 | Temp 98.1°F | Ht 71.0 in | Wt 230.0 lb

## 2022-08-30 DIAGNOSIS — E559 Vitamin D deficiency, unspecified: Secondary | ICD-10-CM

## 2022-08-30 DIAGNOSIS — E538 Deficiency of other specified B group vitamins: Secondary | ICD-10-CM

## 2022-08-30 DIAGNOSIS — I10 Essential (primary) hypertension: Secondary | ICD-10-CM

## 2022-08-30 DIAGNOSIS — E1165 Type 2 diabetes mellitus with hyperglycemia: Secondary | ICD-10-CM | POA: Diagnosis not present

## 2022-08-30 NOTE — Assessment & Plan Note (Signed)
Last vitamin D Lab Results  Component Value Date   VD25OH 37.41 08/24/2022   Low, to start oral replacement

## 2022-08-30 NOTE — Patient Instructions (Signed)
Please continue all other medications as before, and refills have been done if requested.  Please have the pharmacy call with any other refills you may need.  Please continue your efforts at being more active, low cholesterol diet, and weight control.  Please keep your appointments with your specialists as you may have planned - Dermatology for the left ear lesion  Please make an Appointment to return in 6 months, or sooner if needed

## 2022-08-30 NOTE — Assessment & Plan Note (Signed)
Lab Results  Component Value Date   HGBA1C 5.9 08/24/2022   Stable, pt to continue current medical treatment glucotrol xl 10 qqd, metformin 500 bid

## 2022-08-30 NOTE — Assessment & Plan Note (Signed)
BP Readings from Last 3 Encounters:  08/30/22 124/80  08/09/22 (!) 106/58  07/26/22 134/72   Stable, pt to continue medical treatment entresto 24-26 qd

## 2022-08-30 NOTE — Assessment & Plan Note (Signed)
Lab Results  Component Value Date   VITAMINB12 314 02/22/2022   Low to start oral replacement - b12 1000 mcg qd

## 2022-08-30 NOTE — Progress Notes (Signed)
Patient ID: [name], male   DOB: 1934/08/19, 88 y.o.   MRN: [id number]        Chief Complaint: follow up left pinna lesion, low vit d, dm, htn, hld       HPI:  [name] is a 88 y.o. male here overall doing well, Pt denies chest pain, increased sob or doe, wheezing, orthopnea, PND, increased LE swelling, palpitations, dizziness or syncope.   Pt denies polydipsia, polyuria, or new focal neuro s/s.    Pt denies fever, wt loss, night sweats, loss of appetite, or other constitutional symptoms  Does have new left pinna lesion x 3 mo nonhealing. Has new hearing aids, considering cocklear implants.   Wt Readings from Last 3 Encounters:  08/30/22 230 lb (104.3 kg)  08/09/22 227 lb (103 kg)  07/26/22 227 lb (103 kg)   BP Readings from Last 3 Encounters:  08/30/22 124/80  08/09/22 (!) 106/58  07/26/22 134/72         Past Medical History:  Diagnosis Date   AAA (abdominal aortic aneurysm) (HCC)    MONITORED BY DR Hart [place] LOV JUNE 2014--  STABLE  AT 3.7   Abdominal aneurysm without mention of rupture 07/24/2012   Acute sinus infection 06/17/2009   Qualifier: Diagnosis of  By: Jonny Ruiz MD, Len Blalock    Allergic rhinitis    Anxiety state 07/20/2007   Arthritis    Asymptomatic stenosis of left carotid artery without infarction    MILD DISEASE   Bilateral carotid artery stenosis    BPH (benign prostatic hypertrophy)    Coronary artery calcification seen on CT scan 09/05/2017   Diabetes mellitus type 2, noninsulin dependent (HCC) 07/20/2007   Dilated cardiomyopathy (HCC) 12/14/2018   Diverticulosis of colon    Hearing loss of both ears 10/23/2013   History of cardiomyopathy    IDIOPATHIC-- PER [facility] NOTE 2013 , CURRENTLY RESOLVED   History of kidney stones    HTN (hypertension) 01/14/2016   Hypercalcemia 03/15/2019   Hyperlipidemia    Hypertension    Hypertensive heart disease without CHF    Obesity (BMI 30-39.9)    Paresthesia of both feet 07/14/2016   Peripheral vascular disease (HCC)     Personal history of kidney stones    Phimosis    PHN (postherpetic neuralgia) 09/18/2019   Preop exam for internal medicine 05/10/2016   Preventative health care 01/14/2016   Rash 05/01/2015   Right carotid artery occlusion    CHRONIC   WITHOUT INFARTION   Rotator cuff arthropathy of both shoulders 04/10/2019   Spinal stenosis at L4-L5 level 06/08/2016   Stroke (HCC)     Right eye   Type 2 diabetes mellitus (HCC)    Wears hearing aid    Past Surgical History:  Procedure Laterality Date   CARDIOVASCULAR STRESS TEST  JUNE 2008  DR Donnie Aho   CATARACT EXTRACTION W/ INTRAOCULAR LENS IMPLANT Right    CIRCUMCISION N/A 08/30/2012   Procedure: CIRCUMCISION ADULT;  Surgeon: Anner Crete, MD;  Location: [facility];  Service: Urology;  Laterality: N/A;   CYSTO/  RIGHT URETERAL STENT PLACEMENT  10-16-2003   EXTRACORPOREAL SHOCK WAVE LITHOTRIPSY Right 2005   EYE SURGERY     JOINT REPLACEMENT Bilateral 2008  2009   Knee   LUMBAR LAMINECTOMY/DECOMPRESSION MICRODISCECTOMY Bilateral 06/08/2016   Procedure: Bilateral Microlumbar decompression L4-L5 and L5-S1;  Surgeon: Jene Every, MD;  Location: [facility];  Service: Orthopedics;  Laterality: Bilateral;  Requests 2 hours  TOTAL KNEE ARTHROPLASTY Bilateral RIGHT 10-08-2007;   LEFT 09-30-2008   TRANSTHORACIC ECHOCARDIOGRAM  10-21-2009   LVEF 50%    reports that he quit smoking about 40 years ago. His smoking use included cigarettes. He started smoking about 65 years ago. He has a 18.8 pack-year smoking history. He has never used smokeless tobacco. He reports that he does not drink alcohol and does not use drugs. family history includes ALS in his father; Asthma in his mother; COPD in his mother; Depression in his mother; Diabetes in his father; Heart disease in his mother; Hypertension in his mother; Kidney disease in his mother. No Known Allergies Current Outpatient Medications on File Prior to Visit  Medication Sig Dispense Refill    amiodarone (PACERONE) 200 MG tablet Take 0.5 tablets (100 mg total) by mouth daily. 45 tablet 1   atorvastatin (LIPITOR) 80 MG tablet TAKE ONE (1) TABLET BY MOUTH EVERY DAY 90 tablet 3   beta carotene w/minerals (OCUVITE) tablet Take 1 tablet by mouth daily. Unknown strength     clobetasol ointment (TEMOVATE) 0.05 % APPLY TOPICALLY TWICE DAILY (Patient taking differently: Apply 1 application  topically 2 (two) times daily.) 30 g 1   furosemide (LASIX) 40 MG tablet Take 0.5 tablets (20 mg total) by mouth daily. 45 tablet 3   glipiZIDE (GLUCOTROL XL) 10 MG 24 hr tablet Take 10 mg by mouth daily.     glucose blood (FREESTYLE LITE) test strip Use as instructed once daily E11.9 (Patient taking differently: 1 each by Other route as needed for other (glucose). Use as instructed once daily E11.9) 100 each 3   IBU 800 MG tablet Take 800 mg by mouth every 6 (six) hours as needed for mild pain or moderate pain.     ketoconazole (NIZORAL) 2 % cream Apply topically 2 (two) times daily as needed for irritation. (Patient taking differently: Apply 1 Application topically 2 (two) times daily as needed for irritation.) 30 g 1   metFORMIN (GLUCOPHAGE) 1000 MG tablet TAKE 0.5 TABLETS BY MOUTH TWICE DAILY WITH A MEAL (Patient taking differently: Take 500 mg by mouth 2 (two) times daily with a meal.) 90 tablet 3   Multiple Vitamin (MULTIVITAMIN WITH MINERALS) TABS tablet Take 1 tablet by mouth daily. Unknown strength     sacubitril-valsartan (ENTRESTO) 24-26 MG Take 1 tablet by mouth 2 (two) times daily. 60 tablet 6   sildenafil (VIAGRA) 100 MG tablet TAKE 1/2 TO 1 TABLET BY MOUTH DAILY AS NEEDED FOR ERECTILE DYSFUNCTION (Patient taking differently: Take 25-50 mg by mouth as needed for erectile dysfunction.) 5 tablet 11   traMADol (ULTRAM) 50 MG tablet Take 1 tablet (50 mg total) by mouth every 6 (six) hours as needed. (Patient taking differently: Take 50 mg by mouth every 6 (six) hours as needed for moderate pain or severe  pain.) 30 tablet 0   triamcinolone (NASACORT) 55 MCG/ACT AERO nasal inhaler Place 2 sprays into the nose daily. 1 each 12   No current facility-administered medications on file prior to visit.        ROS:  All others reviewed and negative.  Objective        PE:  BP 124/80 (BP Location: Left Arm, Patient Position: Sitting, Cuff Size: Normal)   Pulse (!) 55   Temp 98.1 F (36.7 C) (Oral)   Ht 5\' 11"  (1.803 m)   Wt 230 lb (104.3 kg)   SpO2 98%   BMI 32.08 kg/m  Constitutional: Pt appears in NAD               HENT: Head: NCAT.                Right Ear: External ear normal.                 Left Ear: External ear normal.                Eyes: . Pupils are equal, round, and reactive to light. Conjunctivae and EOM are normal               Nose: without d/c or deformity               Neck: Neck supple. Gross normal ROM               Cardiovascular: Normal rate and regular rhythm.                 Pulmonary/Chest: Effort normal and breath sounds without rales or wheezing.                Abd:  Soft, NT, ND, + BS, no organomegaly               Neurological: Pt is alert. At baseline orientation, motor grossly intact               Skin: Skin is warm.  LE edema - none, left pinna with 1/4 cm raised scaly lesion without ulcer               Psychiatric: Pt behavior is normal without agitation   Micro: none  Cardiac tracings I have personally interpreted today:  none  Pertinent Radiological findings (summarize): none   Lab Results  Component Value Date   WBC 7.1 02/22/2022   HGB 13.7 02/22/2022   HCT 39.8 02/22/2022   PLT 126.0 (L) 02/22/2022   GLUCOSE 105 (H) 08/24/2022   CHOL 135 08/24/2022   TRIG 95.0 08/24/2022   HDL 51.10 08/24/2022   LDLDIRECT 92.0 02/06/2017   LDLCALC 65 08/24/2022   ALT 22 08/24/2022   AST 18 08/24/2022   NA 130 (L) 08/24/2022   K 4.5 08/24/2022   CL 96 08/24/2022   CREATININE 1.10 08/24/2022   BUN 21 08/24/2022   CO2 28 08/24/2022   TSH  2.19 02/22/2022   PSA 2.15 07/14/2016   INR 1.1 09/24/2008   HGBA1C 5.9 08/24/2022   MICROALBUR 1.5 02/22/2022   Assessment/Plan:  [name] is a 88 y.o. White or Caucasian [1] male with  has a past medical history of AAA (abdominal aortic aneurysm) (HCC), Abdominal aneurysm without mention of rupture (07/24/2012), Acute sinus infection (06/17/2009), Allergic rhinitis, Anxiety state (07/20/2007), Arthritis, Asymptomatic stenosis of left carotid artery without infarction, Bilateral carotid artery stenosis, BPH (benign prostatic hypertrophy), Coronary artery calcification seen on CT scan (09/05/2017), Diabetes mellitus type 2, noninsulin dependent (HCC) (07/20/2007), Dilated cardiomyopathy (HCC) (12/14/2018), Diverticulosis of colon, Hearing loss of both ears (10/23/2013), History of cardiomyopathy, History of kidney stones, HTN (hypertension) (01/14/2016), Hypercalcemia (03/15/2019), Hyperlipidemia, Hypertension, Hypertensive heart disease without CHF, Obesity (BMI 30-39.9), Paresthesia of both feet (07/14/2016), Peripheral vascular disease (HCC), Personal history of kidney stones, Phimosis, PHN (postherpetic neuralgia) (09/18/2019), Preop exam for internal medicine (05/10/2016), Preventative health care (01/14/2016), Rash (05/01/2015), Right carotid artery occlusion, Rotator cuff arthropathy of both shoulders (04/10/2019), Spinal stenosis at L4-L5 level (06/08/2016), Stroke (HCC), Type 2 diabetes mellitus (HCC), and Wears hearing aid.  Vitamin D deficiency Last vitamin D Lab Results  Component Value Date   VD25OH 37.41 08/24/2022   Low, to start oral replacement   Type 2 diabetes mellitus (HCC) Lab Results  Component Value Date   HGBA1C 5.9 08/24/2022   Stable, pt to continue current medical treatment glucotrol xl 10 qqd, metformin 500 bid   HTN (hypertension) BP Readings from Last 3 Encounters:  08/30/22 124/80  08/09/22 (!) 106/58  07/26/22 134/72   Stable, pt to continue medical treatment  entresto 24-26 qd   B12 deficiency Lab Results  Component Value Date   VITAMINB12 314 02/22/2022   Low to start oral replacement - b12 1000 mcg qd  Followup: Return in about 6 months (around 03/02/2023).  Oliver Barre, MD 08/30/2022 7:42 PM [facility]

## 2022-09-12 ENCOUNTER — Other Ambulatory Visit: Payer: Self-pay | Admitting: Internal Medicine

## 2022-09-25 ENCOUNTER — Encounter: Payer: Medicare HMO | Admitting: Nurse Practitioner

## 2022-09-25 DIAGNOSIS — J069 Acute upper respiratory infection, unspecified: Secondary | ICD-10-CM

## 2022-09-25 NOTE — Progress Notes (Signed)
Awaiting COVID test results.

## 2022-09-25 NOTE — Progress Notes (Signed)
I have spent 5 minutes in review of e-visit questionnaire, review and updating patient chart, medical decision making and response to patient.  ° °Zelda W Fleming, NP ° °  °

## 2022-09-26 DIAGNOSIS — U071 COVID-19: Secondary | ICD-10-CM | POA: Diagnosis not present

## 2022-10-06 NOTE — Progress Notes (Unsigned)
[name] [address] [place] [address] Phone: [phone] Subjective:   [name], am serving as a scribe for Dr. Antoine Primas.  I'm seeing this patient by the request  of:  Corwin Levins, MD  CC: Low back pain  [id number]  08/09/2022 Patient given injection and tolerated, discussed icing regimen and home exercises, discussed which activities to do and which ones to avoid. Increase activity slowly over the course of next several weeks. We discussed that there is a likelihood though that this is more secondary to spinal stenosis as well. Will need to monitor. May need to consider restarting other medications again such as gabapentin but with low blood pressure will hold at this moment. Depending on this to advanced imaging would be the other aspect 2. F follow-up again in 6 to 8 weeks otherwise.   Updated 10/12/2022 [name] is a 88 y.o. male coming in with complaint of bilateral SI pain patient at last exam was given an injection in the sacroiliac joint. No relief from injection at all. Exercises helped minimally.    Patient did a carotid ultrasound done but has not gotten the report yet.    Past Medical History:  Diagnosis Date   AAA (abdominal aortic aneurysm) (HCC)    MONITORED BY DR LAWSON LOV JUNE 2014--  STABLE  AT 3.7   Abdominal aneurysm without mention of rupture 07/24/2012   Acute sinus infection 06/17/2009   Qualifier: Diagnosis of  By: Jonny Ruiz MD, Len Blalock    Allergic rhinitis    Anxiety state 07/20/2007   Arthritis    Asymptomatic stenosis of left carotid artery without infarction    MILD DISEASE   Bilateral carotid artery stenosis    BPH (benign prostatic hypertrophy)    Coronary artery calcification seen on CT scan 09/05/2017   Diabetes mellitus type 2, noninsulin dependent (HCC) 07/20/2007   Dilated cardiomyopathy (HCC) 12/14/2018   Diverticulosis of colon    Hearing loss of both ears 10/23/2013   History of  cardiomyopathy    IDIOPATHIC-- PER [facility] NOTE 2013 , CURRENTLY RESOLVED   History of kidney stones    HTN (hypertension) 01/14/2016   Hypercalcemia 03/15/2019   Hyperlipidemia    Hypertension    Hypertensive heart disease without CHF    Obesity (BMI 30-39.9)    Paresthesia of both feet 07/14/2016   Peripheral vascular disease (HCC)    Personal history of kidney stones    Phimosis    PHN (postherpetic neuralgia) 09/18/2019   Preop exam for internal medicine 05/10/2016   Preventative health care 01/14/2016   Rash 05/01/2015   Right carotid artery occlusion    CHRONIC   WITHOUT INFARTION   Rotator cuff arthropathy of both shoulders 04/10/2019   Spinal stenosis at L4-L5 level 06/08/2016   Stroke (HCC)     Right eye   Type 2 diabetes mellitus (HCC)    Wears hearing aid    Past Surgical History:  Procedure Laterality Date   CARDIOVASCULAR STRESS TEST  JUNE 2008  DR Donnie Aho   CATARACT EXTRACTION W/ INTRAOCULAR LENS IMPLANT Right    CIRCUMCISION N/A 08/30/2012   Procedure: CIRCUMCISION ADULT;  Surgeon: Anner Crete, MD;  Location: [name];  Service: Urology;  Laterality: N/A;   CYSTO/  RIGHT URETERAL STENT PLACEMENT  10-16-2003   EXTRACORPOREAL SHOCK WAVE LITHOTRIPSY Right 2005   EYE SURGERY     JOINT REPLACEMENT Bilateral 2008  2009   Knee  LUMBAR LAMINECTOMY/DECOMPRESSION MICRODISCECTOMY Bilateral 06/08/2016   Procedure: Bilateral Microlumbar decompression L4-L5 and L5-S1;  Surgeon: Jene Every, MD;  Location: [facility];  Service: Orthopedics;  Laterality: Bilateral;  Requests 2 hours   TOTAL KNEE ARTHROPLASTY Bilateral RIGHT 10-08-2007;   LEFT 09-30-2008   TRANSTHORACIC ECHOCARDIOGRAM  10-21-2009   LVEF 50%   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      Employer: RETIRED    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  Tobacco Use   Smoking status: Former    Current packs/day: 0.00    Average packs/day: 0.8 packs/day for 25.0 years (18.8 ttl pk-yrs)    Types: Cigarettes    Start date: 07/24/1957    Quit date: 07/25/1982    Years since quitting: 40.2   Smokeless tobacco: Never  Vaping Use   Vaping status: Never Used  Substance and Sexual Activity   Alcohol use: No    Alcohol/week: 0.0 standard drinks of alcohol    Comment: recovering alcoholic  +35yrs   Drug use: No   Sexual activity: Not on file  Other Topics Concern   Not on file  Social History Narrative   Not on file   Social Determinants of Health   Financial Resource Strain: Low Risk  (08/06/2021)   Overall Financial Resource Strain (CARDIA)    Difficulty of Paying Living Expenses: Not hard at all  Food Insecurity: No Food Insecurity (08/06/2021)   Hunger Vital Sign    Worried About Running Out of Food in the Last Year: Never true    Ran Out of Food in the Last Year: Never true  Transportation Needs: No Transportation Needs (08/06/2021)   PRAPARE - Administrator, Civil Service (Medical): No    Lack of Transportation (Non-Medical): No  Physical Activity: Sufficiently Active (08/06/2021)   Exercise Vital Sign    Days of Exercise per Week: 5 days    Minutes of Exercise per Session: 30 min  Stress: No Stress Concern Present (08/06/2021)   [employer] of Occupational Health - Occupational Stress Questionnaire    Feeling of Stress : Not at all  Social Connections: Moderately Integrated (08/06/2021)   Social Connection and Isolation Panel [NHANES]    Frequency of Communication with Friends and Family: More than three times a week    Frequency of Social Gatherings with Friends and Family: More than three times a week    Attends Religious Services: 1 to 4 times per year    Active Member of [employer] or Organizations: No    Attends Banker Meetings:  1 to 4 times per year    Marital Status: Widowed   No Known Allergies Family History  Problem Relation Age of Onset   Heart disease Mother        CHF  Before age 6   COPD Mother    Kidney disease Mother    Hypertension Mother    Asthma Mother    Depression Mother    ALS Father    Diabetes Father    Colon cancer Neg Hx     Current Outpatient Medications (Endocrine & Metabolic):    glipiZIDE (GLUCOTROL XL) 10 MG 24 hr tablet, Take 10 mg by mouth daily.   metFORMIN (  GLUCOPHAGE) 1000 MG tablet, TAKE 0.5 TABLETS BY MOUTH TWICE DAILY WITH A MEAL (Patient taking differently: Take 500 mg by mouth 2 (two) times daily with a meal.)  Current Outpatient Medications (Cardiovascular):    amiodarone (PACERONE) 200 MG tablet, Take 0.5 tablets (100 mg total) by mouth daily.   atorvastatin (LIPITOR) 80 MG tablet, TAKE ONE (1) TABLET BY MOUTH EVERY DAY (Patient taking differently: Take 80 mg by mouth daily.)   furosemide (LASIX) 40 MG tablet, Take 0.5 tablets (20 mg total) by mouth daily.   sacubitril-valsartan (ENTRESTO) 24-26 MG, Take 1 tablet by mouth 2 (two) times daily.   sildenafil (VIAGRA) 100 MG tablet, TAKE 1/2 TO 1 TABLET BY MOUTH DAILY AS NEEDED FOR ERECTILE DYSFUNCTION (Patient taking differently: Take 25-50 mg by mouth as needed for erectile dysfunction.)  Current Outpatient Medications (Respiratory):    triamcinolone (NASACORT) 55 MCG/ACT AERO nasal inhaler, Place 2 sprays into the nose daily.  Current Outpatient Medications (Analgesics):    IBU 800 MG tablet, Take 800 mg by mouth every 6 (six) hours as needed for mild pain or moderate pain.   traMADol (ULTRAM) 50 MG tablet, Take 1 tablet (50 mg total) by mouth every 6 (six) hours as needed. (Patient taking differently: Take 50 mg by mouth every 6 (six) hours as needed for moderate pain or severe pain.)   Current Outpatient Medications (Other):    beta carotene w/minerals (OCUVITE) tablet, Take 1 tablet by mouth daily. Unknown  strength   clobetasol ointment (TEMOVATE) 0.05 %, APPLY TOPICALLY TWICE DAILY (Patient taking differently: Apply 1 application  topically 2 (two) times daily.)   glucose blood (FREESTYLE LITE) test strip, Use as instructed once daily E11.9 (Patient taking differently: 1 each by Other route as needed for other (glucose). Use as instructed once daily E11.9)   ketoconazole (NIZORAL) 2 % cream, APPLY TOPICALLY TWICE DAILY AS NEEDED FOR IRRITATION (Patient taking differently: Apply 1 Application topically 2 (two) times daily as needed for irritation.)   Multiple Vitamin (MULTIVITAMIN WITH MINERALS) TABS tablet, Take 1 tablet by mouth daily. Unknown strength   Reviewed prior external information including notes and imaging from  primary care provider As well as notes that were available from care everywhere and other healthcare systems.  Past medical history, social, surgical and family history all reviewed in electronic medical record.  No pertanent information unless stated regarding to the chief complaint.   Review of Systems:  No headache, visual changes, nausea, vomiting, diarrhea, constipation, dizziness, abdominal pain, skin rash, fevers, chills, night sweats, weight loss, swollen lymph nodes, body aches, joint swelling, chest pain, shortness of breath, mood changes. POSITIVE muscle aches  Objective  There were no vitals taken for this visit.   General: No apparent distress alert and oriented x3 mood and affect normal, dressed appropriately.  HEENT: Pupils equal, extraocular movements intact  Respiratory: Patient's speak in full sentences and does not appear short of breath  Cardiovascular: No lower extremity edema, non tender, no erythema  Back exam shows    Impression and Recommendations:    The above documentation has been reviewed and is accurate and complete Judi Saa, DO

## 2022-10-11 ENCOUNTER — Encounter: Payer: Self-pay | Admitting: Cardiology

## 2022-10-11 ENCOUNTER — Ambulatory Visit: Payer: Medicare HMO | Attending: Cardiology | Admitting: Cardiology

## 2022-10-11 VITALS — BP 98/60 | HR 81 | Ht 71.0 in | Wt 229.0 lb

## 2022-10-11 DIAGNOSIS — I420 Dilated cardiomyopathy: Secondary | ICD-10-CM

## 2022-10-11 DIAGNOSIS — I493 Ventricular premature depolarization: Secondary | ICD-10-CM

## 2022-10-11 DIAGNOSIS — Z7984 Long term (current) use of oral hypoglycemic drugs: Secondary | ICD-10-CM | POA: Diagnosis not present

## 2022-10-11 DIAGNOSIS — E119 Type 2 diabetes mellitus without complications: Secondary | ICD-10-CM | POA: Diagnosis not present

## 2022-10-11 DIAGNOSIS — I7143 Infrarenal abdominal aortic aneurysm, without rupture: Secondary | ICD-10-CM

## 2022-10-11 DIAGNOSIS — I2510 Atherosclerotic heart disease of native coronary artery without angina pectoris: Secondary | ICD-10-CM | POA: Diagnosis not present

## 2022-10-11 NOTE — Patient Instructions (Addendum)
Medication Instructions:  Your physician recommends that you continue on your current medications as directed. Please refer to the Current Medication list given to you today.  *If you need a refill on your cardiac medications before your next appointment, please call your pharmacy*   Lab Work: None Ordered If you have labs (blood work) drawn today and your tests are completely normal, you will receive your results only by: MyChart Message (if you have MyChart) OR A paper copy in the mail If you have any lab test that is abnormal or we need to change your treatment, we will call you to review the results.   Testing/Procedures: Your physician has requested that you have a carotid duplex. This test is an ultrasound of the carotid arteries in your neck. It looks at blood flow through these arteries that supply the brain with blood. Allow one hour for this exam. There are no restrictions or special instructions.    Follow-Up: At [facility], you and your health needs are our priority.  As part of our continuing mission to provide you with exceptional heart care, we have created designated Provider Care Teams.  These Care Teams include your primary Cardiologist (physician) and Advanced Practice Providers (APPs -  Physician Assistants and Nurse Practitioners) who all work together to provide you with the care you need, when you need it.  We recommend signing up for the patient portal called "MyChart".  Sign up information is provided on this After Visit Summary.  MyChart is used to connect with patients for Virtual Visits (Telemedicine).  Patients are able to view lab/test results, encounter notes, upcoming appointments, etc.  Non-urgent messages can be sent to your provider as well.   To learn more about what you can do with MyChart, go to [email or web address].    Your next appointment:   3 month(s)  The format for your next appointment:   In Person  Provider:   Gypsy Balsam, MD     Other Instructions NA

## 2022-10-11 NOTE — Addendum Note (Signed)
Addended by: Baldo Ash D on: 10/11/2022 10:15 AM   Modules accepted: Orders

## 2022-10-11 NOTE — Progress Notes (Signed)
Cardiology Office Note:    Date:  10/11/2022   ID:  [name], DOB 1934/02/20, MRN [id number]  PCP:  Corwin Levins, MD  Cardiologist:  Gypsy Balsam, MD    Referring MD: Corwin Levins, MD   Chief Complaint  Patient presents with   Shortness of Breath         History of Present Illness:    [name] is a 88 y.o. male past medical history significant for cardiomyopathy with recent echocardiogram showed ejection fraction 35% previously 4045, I have difficulty putting him on appropriate guideline directed medical therapy because of orthostatic hypotension, he does have history of abdominal aortic aneurysm measuring 37 mm, essential hypertension, diabetes, PVCs successfully suppressed with amiodarone. Comes today to months for follow-up he is significantly more short of breath he did have COVID 3 weeks ago gradual recovering.  Still some cough and shortness of breath.  No swelling of lower extremities no palpitations no dizziness  Past Medical History:  Diagnosis Date   AAA (abdominal aortic aneurysm) (HCC)    MONITORED BY DR LAWSON LOV JUNE 2014--  STABLE  AT 3.7   Abdominal aneurysm without mention of rupture 07/24/2012   Acute sinus infection 06/17/2009   Qualifier: Diagnosis of  By: Jonny Ruiz MD, Len Blalock    Allergic rhinitis    Anxiety state 07/20/2007   Arthritis    Asymptomatic stenosis of left carotid artery without infarction    MILD DISEASE   Bilateral carotid artery stenosis    BPH (benign prostatic hypertrophy)    Coronary artery calcification seen on CT scan 09/05/2017   Diabetes mellitus type 2, noninsulin dependent (HCC) 07/20/2007   Dilated cardiomyopathy (HCC) 12/14/2018   Diverticulosis of colon    Hearing loss of both ears 10/23/2013   History of cardiomyopathy    IDIOPATHIC-- PER [facility] NOTE 2013 , CURRENTLY RESOLVED   History of kidney stones    HTN (hypertension) 01/14/2016   Hypercalcemia 03/15/2019   Hyperlipidemia    Hypertension    Hypertensive  heart disease without CHF    Obesity (BMI 30-39.9)    Paresthesia of both feet 07/14/2016   Peripheral vascular disease (HCC)    Personal history of kidney stones    Phimosis    PHN (postherpetic neuralgia) 09/18/2019   Preop exam for internal medicine 05/10/2016   Preventative health care 01/14/2016   Rash 05/01/2015   Right carotid artery occlusion    CHRONIC   WITHOUT INFARTION   Rotator cuff arthropathy of both shoulders 04/10/2019   Spinal stenosis at L4-L5 level 06/08/2016   Stroke (HCC)     Right eye   Type 2 diabetes mellitus (HCC)    Wears hearing aid     Past Surgical History:  Procedure Laterality Date   CARDIOVASCULAR STRESS TEST  JUNE 2008  DR Donnie Aho   CATARACT EXTRACTION W/ INTRAOCULAR LENS IMPLANT Right    CIRCUMCISION N/A 08/30/2012   Procedure: CIRCUMCISION ADULT;  Surgeon: Anner Crete, MD;  Location: [facility];  Service: Urology;  Laterality: N/A;   CYSTO/  RIGHT URETERAL STENT PLACEMENT  10-16-2003   EXTRACORPOREAL SHOCK WAVE LITHOTRIPSY Right 2005   EYE SURGERY     JOINT REPLACEMENT Bilateral 2008  2009   Knee   LUMBAR LAMINECTOMY/DECOMPRESSION MICRODISCECTOMY Bilateral 06/08/2016   Procedure: Bilateral Microlumbar decompression L4-L5 and L5-S1;  Surgeon: Jene Every, MD;  Location: [facility];  Service: Orthopedics;  Laterality: Bilateral;  Requests 2 hours   TOTAL KNEE ARTHROPLASTY  Bilateral RIGHT 10-08-2007;   LEFT 09-30-2008   TRANSTHORACIC ECHOCARDIOGRAM  10-21-2009   LVEF 50%    Current Medications: Current Meds  Medication Sig   amiodarone (PACERONE) 200 MG tablet Take 0.5 tablets (100 mg total) by mouth daily.   atorvastatin (LIPITOR) 80 MG tablet TAKE ONE (1) TABLET BY MOUTH EVERY DAY (Patient taking differently: Take 80 mg by mouth daily.)   beta carotene w/minerals (OCUVITE) tablet Take 1 tablet by mouth daily. Unknown strength   clobetasol ointment (TEMOVATE) 0.05 % APPLY TOPICALLY TWICE DAILY (Patient taking differently: Apply 1  application  topically 2 (two) times daily.)   furosemide (LASIX) 40 MG tablet Take 0.5 tablets (20 mg total) by mouth daily.   glipiZIDE (GLUCOTROL XL) 10 MG 24 hr tablet Take 10 mg by mouth daily.   glucose blood (FREESTYLE LITE) test strip Use as instructed once daily E11.9 (Patient taking differently: 1 each by Other route as needed for other (glucose). Use as instructed once daily E11.9)   IBU 800 MG tablet Take 800 mg by mouth every 6 (six) hours as needed for mild pain or moderate pain.   ketoconazole (NIZORAL) 2 % cream APPLY TOPICALLY TWICE DAILY AS NEEDED FOR IRRITATION (Patient taking differently: Apply 1 Application topically 2 (two) times daily as needed for irritation.)   metFORMIN (GLUCOPHAGE) 1000 MG tablet TAKE 0.5 TABLETS BY MOUTH TWICE DAILY WITH A MEAL (Patient taking differently: Take 500 mg by mouth 2 (two) times daily with a meal.)   Multiple Vitamin (MULTIVITAMIN WITH MINERALS) TABS tablet Take 1 tablet by mouth daily. Unknown strength   sacubitril-valsartan (ENTRESTO) 24-26 MG Take 1 tablet by mouth 2 (two) times daily.   sildenafil (VIAGRA) 100 MG tablet TAKE 1/2 TO 1 TABLET BY MOUTH DAILY AS NEEDED FOR ERECTILE DYSFUNCTION (Patient taking differently: Take 25-50 mg by mouth as needed for erectile dysfunction.)   traMADol (ULTRAM) 50 MG tablet Take 1 tablet (50 mg total) by mouth every 6 (six) hours as needed. (Patient taking differently: Take 50 mg by mouth every 6 (six) hours as needed for moderate pain or severe pain.)   triamcinolone (NASACORT) 55 MCG/ACT AERO nasal inhaler Place 2 sprays into the nose daily.     Allergies:   Patient has no known allergies.   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  Tobacco Use   Smoking status: Former    Current packs/day: 0.00    Average packs/day: 0.8 packs/day for 25.0 years (18.8 ttl pk-yrs)    Types: Cigarettes    Start date: 07/24/1957    Quit date: 07/25/1982    Years since quitting: 40.2   Smokeless tobacco: Never  Vaping Use   Vaping status: Never Used  Substance and Sexual Activity   Alcohol use: No    Alcohol/week: 0.0 standard drinks of alcohol    Comment: recovering alcoholic  +79yrs   Drug use: No   Sexual activity: Not on file  Other Topics Concern   Not on file  Social History Narrative   Not on file   Social Determinants of Health   Financial Resource Strain: Low Risk  (08/06/2021)   Overall  Financial Resource Strain (CARDIA)    Difficulty of Paying Living Expenses: Not hard at all  Food Insecurity: No Food Insecurity (08/06/2021)   Hunger Vital Sign    Worried About Running Out of Food in the Last Year: Never true    Ran Out of Food in the Last Year: Never true  Transportation Needs: No Transportation Needs (08/06/2021)   PRAPARE - Administrator, Civil Service (Medical): No    Lack of Transportation (Non-Medical): No  Physical Activity: Sufficiently Active (08/06/2021)   Exercise Vital Sign    Days of Exercise per Week: 5 days    Minutes of Exercise per Session: 30 min  Stress: No Stress Concern Present (08/06/2021)   [employer] of Occupational Health - Occupational Stress Questionnaire    Feeling of Stress : Not at all  Social Connections: Moderately Integrated (08/06/2021)   Social Connection and Isolation Panel [NHANES]    Frequency of Communication with Friends and Family: More than three times a week    Frequency of Social Gatherings with Friends and Family: More than three times a week    Attends Religious Services: 1 to 4 times per year    Active Member of [employer] or Organizations: No    Attends Banker Meetings: 1 to 4 times per year    Marital Status:  Widowed     Family History: The patient's family history includes ALS in his father; Asthma in his mother; COPD in his mother; Depression in his mother; Diabetes in his father; Heart disease in his mother; Hypertension in his mother; Kidney disease in his mother. There is no history of Colon cancer. ROS:   Please see the history of present illness.    All 14 point review of systems negative except as described per history of present illness  EKGs/Labs/Other Studies Reviewed:    EKG Interpretation Date/Time:  Tuesday October 11 2022 09:51:46 EDT Ventricular Rate:  57 PR Interval:  172 QRS Duration:  130 QT Interval:  460 QTC Calculation: 447 R Axis:   -13  Text Interpretation: Sinus bradycardia Non-specific intra-ventricular conduction block Possible Inferior infarct (cited on or before 15-Oct-2003) Cannot rule out Anterior infarct , age undetermined T wave abnormality, consider lateral ischemia When compared with ECG of 15-Oct-2003 09:48, QRS duration has increased Minimal criteria for Anterior infarct are now Present Confirmed by Gypsy Balsam [phone]) on 10/11/2022 9:55:31 AM    Recent Labs: 02/22/2022: Hemoglobin 13.7; Platelets 126.0; TSH 2.19 08/24/2022: ALT 22; BUN 21; Creatinine, Ser 1.10; Potassium 4.5; Sodium 130  Recent Lipid Panel    Component Value Date/Time   CHOL 135 08/24/2022 1420   CHOL 124 03/11/2020 1108   TRIG 95.0 08/24/2022 1420   HDL 51.10 08/24/2022 1420   HDL 41 03/11/2020 1108   CHOLHDL 3 08/24/2022 1420   VLDL 19.0 08/24/2022 1420   LDLCALC 65 08/24/2022 1420   LDLCALC 67 03/11/2020 1108   LDLDIRECT 92.0 02/06/2017 1119    Physical Exam:    VS:  BP 98/60 (BP Location: Left Arm, Patient Position: Sitting)   Pulse 81   Ht 5\' 11"  (1.803 m)   Wt 229 lb (103.9 kg)   SpO2 98%   BMI 31.94 kg/m     Wt Readings from Last 3 Encounters:  10/11/22 229 lb (103.9 kg)  08/30/22 230 lb (104.3 kg)  08/09/22 227 lb (103 kg)     GEN:  Well nourished,  well developed in no acute distress HEENT: Normal NECK: No  JVD; No carotid bruits LYMPHATICS: No lymphadenopathy CARDIAC: RRR, no murmurs, no rubs, no gallops RESPIRATORY:  Clear to auscultation without rales, wheezing or rhonchi  ABDOMEN: Soft, non-tender, non-distended MUSCULOSKELETAL:  No edema; No deformity  SKIN: Warm and dry LOWER EXTREMITIES: no swelling NEUROLOGIC:  Alert and oriented x 3 PSYCHIATRIC:  Normal affect   ASSESSMENT:    1. Dilated cardiomyopathy (HCC)   2. Infrarenal abdominal aortic aneurysm (AAA) without rupture (HCC)   3. PVC's (premature ventricular contractions)   4. Diabetes mellitus type 2, noninsulin dependent (HCC)    PLAN:    In order of problems listed above:  Dilated cardiomyopathy he is on maximum dosages of medication he can tolerate.  Only 2426 Entresto twice daily still described to have situation when he gets up sometimes to get dizzy.  Will continue present management.  I will see him back in my office in about 3 months to see how he does at that time anticipate a repeat echocardiogram. Infrarenal aortic aneurysm measuring 37 mm.  The last test reviewed in February done looks stable. PVCs denies have any palpitations. Diabetes that being followed by internal medicine team, hemoglobin A1c 5.9 from 08/24/2022. Cardiac arterial disease.  Time to repeat his carotid ultrasounds which will make arrangements for.   Medication Adjustments/Labs and Tests Ordered: Current medicines are reviewed at length with the patient today.  Concerns regarding medicines are outlined above.  Orders Placed This Encounter  Procedures   EKG 12-Lead   Medication changes: No orders of the defined types were placed in this encounter.   Signed, Georgeanna Lea, MD, [facility] 10/11/2022 10:04 AM    [facility]

## 2022-10-12 ENCOUNTER — Ambulatory Visit: Payer: Medicare HMO | Admitting: Family Medicine

## 2022-10-12 VITALS — BP 120/64 | HR 72 | Ht 71.0 in | Wt 230.0 lb

## 2022-10-12 DIAGNOSIS — M545 Low back pain, unspecified: Secondary | ICD-10-CM | POA: Diagnosis not present

## 2022-10-12 DIAGNOSIS — M48061 Spinal stenosis, lumbar region without neurogenic claudication: Secondary | ICD-10-CM

## 2022-10-12 NOTE — Patient Instructions (Signed)
MRI [phone] Arnica lotion topically We will be in touch

## 2022-10-13 ENCOUNTER — Encounter: Payer: Self-pay | Admitting: Family Medicine

## 2022-10-13 NOTE — Assessment & Plan Note (Signed)
Concern at this point the patient may have some progression of the spinal stenosis.  Patient has failed all conservative therapy including injections in the sacroiliac joint, home exercises, formal physical therapy.  Do feel that an MRI would be beneficial to see if there is some other potential injections that could be helpful.  Patient wants to hold on anything such as gabapentin secondary to low blood pressure on this.  Also did not feel like it made any significant difference otherwise.  Patient will follow-up after imaging and we will discuss further treatment options

## 2022-10-17 ENCOUNTER — Ambulatory Visit (HOSPITAL_COMMUNITY)
Admission: RE | Admit: 2022-10-17 | Discharge: 2022-10-17 | Disposition: A | Discharge status: Home / Self Care | Payer: Medicare HMO | Source: Ambulatory Visit | Attending: Cardiovascular Disease | Admitting: Cardiovascular Disease

## 2022-10-17 DIAGNOSIS — I2510 Atherosclerotic heart disease of native coronary artery without angina pectoris: Secondary | ICD-10-CM | POA: Insufficient documentation

## 2022-10-17 DIAGNOSIS — I6523 Occlusion and stenosis of bilateral carotid arteries: Secondary | ICD-10-CM | POA: Diagnosis not present

## 2022-10-28 ENCOUNTER — Encounter: Payer: Self-pay | Admitting: Cardiology

## 2022-11-01 ENCOUNTER — Other Ambulatory Visit: Payer: Self-pay | Admitting: Internal Medicine

## 2022-11-01 ENCOUNTER — Other Ambulatory Visit: Payer: Self-pay

## 2022-11-07 ENCOUNTER — Ambulatory Visit
Admission: RE | Admit: 2022-11-07 | Discharge: 2022-11-07 | Disposition: A | Discharge status: Home / Self Care | Payer: Medicare HMO | Source: Ambulatory Visit | Attending: Family Medicine | Admitting: Family Medicine

## 2022-11-07 DIAGNOSIS — M5126 Other intervertebral disc displacement, lumbar region: Secondary | ICD-10-CM | POA: Diagnosis not present

## 2022-11-07 DIAGNOSIS — M47816 Spondylosis without myelopathy or radiculopathy, lumbar region: Secondary | ICD-10-CM | POA: Diagnosis not present

## 2022-11-07 DIAGNOSIS — M545 Low back pain, unspecified: Secondary | ICD-10-CM

## 2022-11-07 DIAGNOSIS — M48061 Spinal stenosis, lumbar region without neurogenic claudication: Secondary | ICD-10-CM | POA: Diagnosis not present

## 2022-11-08 DIAGNOSIS — H903 Sensorineural hearing loss, bilateral: Secondary | ICD-10-CM | POA: Diagnosis not present

## 2022-11-29 ENCOUNTER — Encounter: Payer: Self-pay | Admitting: Family Medicine

## 2022-11-30 ENCOUNTER — Other Ambulatory Visit: Payer: Self-pay

## 2022-11-30 DIAGNOSIS — E113293 Type 2 diabetes mellitus with mild nonproliferative diabetic retinopathy without macular edema, bilateral: Secondary | ICD-10-CM | POA: Diagnosis not present

## 2022-11-30 DIAGNOSIS — H3563 Retinal hemorrhage, bilateral: Secondary | ICD-10-CM | POA: Diagnosis not present

## 2022-11-30 DIAGNOSIS — H34211 Partial retinal artery occlusion, right eye: Secondary | ICD-10-CM | POA: Diagnosis not present

## 2022-11-30 DIAGNOSIS — H35372 Puckering of macula, left eye: Secondary | ICD-10-CM | POA: Diagnosis not present

## 2022-11-30 DIAGNOSIS — M5416 Radiculopathy, lumbar region: Secondary | ICD-10-CM

## 2022-12-07 NOTE — Discharge Instructions (Signed)

## 2022-12-08 ENCOUNTER — Ambulatory Visit
Admission: RE | Admit: 2022-12-08 | Discharge: 2022-12-08 | Disposition: A | Discharge status: Home / Self Care | Payer: Medicare HMO | Source: Ambulatory Visit | Attending: Family Medicine | Admitting: Family Medicine

## 2022-12-08 DIAGNOSIS — M5416 Radiculopathy, lumbar region: Secondary | ICD-10-CM | POA: Diagnosis not present

## 2022-12-08 MED ORDER — METHYLPREDNISOLONE ACETATE 40 MG/ML INJ SUSP (RADIOLOG
80.0000 mg | Freq: Once | INTRAMUSCULAR | Status: AC
  Administered 2022-12-08: 80 mg via EPIDURAL

## 2022-12-08 MED ORDER — IOPAMIDOL (ISOVUE-M 200) INJECTION 41%
1.0000 mL | Freq: Once | INTRAMUSCULAR | Status: AC
  Administered 2022-12-08: 1 mL via EPIDURAL

## 2023-01-18 NOTE — Progress Notes (Unsigned)
[name] [address] [place] [address] Phone: [phone] Subjective:    I'm seeing this patient by the request  of:  Corwin Levins, MD  CC:   [id number]  10/12/2022 Concern at this point the patient may have some progression of the spinal stenosis. Patient has failed all conservative therapy including injections in the sacroiliac joint, home exercises, formal physical therapy. Do feel that an MRI would be beneficial to see if there is some other potential injections that could be helpful. Patient wants to hold on anything such as gabapentin secondary to low blood pressure on this. Also did not feel like it made any significant difference otherwise. Patient will follow-up after imaging and we will discuss further treatment options   Update 01/19/2023 [name] is a 88 y.o. male coming in with complaint of lumbar spinal stenosis. Epidural on 12/08/2022. Patient states       Past Medical History:  Diagnosis Date   AAA (abdominal aortic aneurysm) (HCC)    MONITORED BY DR LAWSON LOV JUNE 2014--  STABLE  AT 3.7   Abdominal aneurysm without mention of rupture 07/24/2012   Acute sinus infection 06/17/2009   Qualifier: Diagnosis of  By: Jonny Ruiz MD, Len Blalock    Allergic rhinitis    Anxiety state 07/20/2007   Arthritis    Asymptomatic stenosis of left carotid artery without infarction    MILD DISEASE   Bilateral carotid artery stenosis    BPH (benign prostatic hypertrophy)    Coronary artery calcification seen on CT scan 09/05/2017   Diabetes mellitus type 2, noninsulin dependent (HCC) 07/20/2007   Dilated cardiomyopathy (HCC) 12/14/2018   Diverticulosis of colon    Hearing loss of both ears 10/23/2013   History of cardiomyopathy    IDIOPATHIC-- PER [facility] NOTE 2013 , CURRENTLY RESOLVED   History of kidney stones    HTN (hypertension) 01/14/2016   Hypercalcemia 03/15/2019   Hyperlipidemia    Hypertension    Hypertensive heart disease without  CHF    Obesity (BMI 30-39.9)    Paresthesia of both feet 07/14/2016   Peripheral vascular disease (HCC)    Personal history of kidney stones    Phimosis    PHN (postherpetic neuralgia) 09/18/2019   Preop exam for internal medicine 05/10/2016   Preventative health care 01/14/2016   Rash 05/01/2015   Right carotid artery occlusion    CHRONIC   WITHOUT INFARTION   Rotator cuff arthropathy of both shoulders 04/10/2019   Spinal stenosis at L4-L5 level 06/08/2016   Stroke (HCC)     Right eye   Type 2 diabetes mellitus (HCC)    Wears hearing aid    Past Surgical History:  Procedure Laterality Date   CARDIOVASCULAR STRESS TEST  JUNE 2008  DR Donnie Aho   CATARACT EXTRACTION W/ INTRAOCULAR LENS IMPLANT Right    CIRCUMCISION N/A 08/30/2012   Procedure: CIRCUMCISION ADULT;  Surgeon: Anner Crete, MD;  Location: [facility];  Service: Urology;  Laterality: N/A;   CYSTO/  RIGHT URETERAL STENT PLACEMENT  10-16-2003   EXTRACORPOREAL SHOCK WAVE LITHOTRIPSY Right 2005   EYE SURGERY     JOINT REPLACEMENT Bilateral 2008  2009   Knee   LUMBAR LAMINECTOMY/DECOMPRESSION MICRODISCECTOMY Bilateral 06/08/2016   Procedure: Bilateral Microlumbar decompression L4-L5 and L5-S1;  Surgeon: Jene Every, MD;  Location: [facility];  Service: Orthopedics;  Laterality: Bilateral;  Requests 2 hours   TOTAL KNEE ARTHROPLASTY Bilateral RIGHT 10-08-2007;   LEFT 09-30-2008  TRANSTHORACIC ECHOCARDIOGRAM  10-21-2009   LVEF 50%   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  Tobacco Use   Smoking status: Former    Current packs/day: 0.00    Average packs/day: 0.8 packs/day for 25.0 years (18.8 ttl pk-yrs)    Types: Cigarettes    Start  date: 07/24/1957    Quit date: 07/25/1982    Years since quitting: 40.5   Smokeless tobacco: Never  Vaping Use   Vaping status: Never Used  Substance and Sexual Activity   Alcohol use: No    Alcohol/week: 0.0 standard drinks of alcohol    Comment: recovering alcoholic  +55yrs   Drug use: No   Sexual activity: Not on file  Other Topics Concern   Not on file  Social History Narrative   Not on file   Social Determinants of Health   Financial Resource Strain: Low Risk  (08/06/2021)   Overall Financial Resource Strain (CARDIA)    Difficulty of Paying Living Expenses: Not hard at all  Food Insecurity: No Food Insecurity (08/06/2021)   Hunger Vital Sign    Worried About Running Out of Food in the Last Year: Never true    Ran Out of Food in the Last Year: Never true  Transportation Needs: No Transportation Needs (08/06/2021)   PRAPARE - Administrator, Civil Service (Medical): No    Lack of Transportation (Non-Medical): No  Physical Activity: Sufficiently Active (08/06/2021)   Exercise Vital Sign    Days of Exercise per Week: 5 days    Minutes of Exercise per Session: 30 min  Stress: No Stress Concern Present (08/06/2021)   [employer] of Occupational Health - Occupational Stress Questionnaire    Feeling of Stress : Not at all  Social Connections: Moderately Integrated (08/06/2021)   Social Connection and Isolation Panel [NHANES]    Frequency of Communication with Friends and Family: More than three times a week    Frequency of Social Gatherings with Friends and Family: More than three times a week    Attends Religious Services: 1 to 4 times per year    Active Member of [employer] or Organizations: No    Attends Banker Meetings: 1 to 4 times per year    Marital Status: Widowed   No Known Allergies Family History  Problem Relation Age of Onset   Heart disease Mother        CHF  Before age 34   COPD Mother    Kidney disease Mother    Hypertension Mother     Asthma Mother    Depression Mother    ALS Father    Diabetes Father    Colon cancer Neg Hx     Current Outpatient Medications (Endocrine & Metabolic):    glipiZIDE (GLUCOTROL XL) 10 MG 24 hr tablet, TAKE ONE (1) TABLET BY MOUTH EVERY DAY WITH BREAKFAST   metFORMIN (GLUCOPHAGE) 1000 MG tablet, TAKE 0.5 TABLETS BY MOUTH TWICE DAILY WITH A MEAL (Patient taking differently: Take 500 mg by mouth 2 (two) times daily with a meal.)  Current Outpatient Medications (Cardiovascular):    amiodarone (PACERONE) 200 MG tablet, Take  0.5 tablets (100 mg total) by mouth daily.   atorvastatin (LIPITOR) 80 MG tablet, TAKE ONE (1) TABLET BY MOUTH EVERY DAY (Patient taking differently: Take 80 mg by mouth daily.)   furosemide (LASIX) 40 MG tablet, Take 0.5 tablets (20 mg total) by mouth daily.   sacubitril-valsartan (ENTRESTO) 24-26 MG, Take 1 tablet by mouth 2 (two) times daily.   sildenafil (VIAGRA) 100 MG tablet, TAKE 1/2 TO 1 TABLET BY MOUTH DAILY AS NEEDED FOR ERECTILE DYSFUNCTION (Patient taking differently: Take 25-50 mg by mouth as needed for erectile dysfunction.)  Current Outpatient Medications (Respiratory):    triamcinolone (NASACORT) 55 MCG/ACT AERO nasal inhaler, Place 2 sprays into the nose daily.  Current Outpatient Medications (Analgesics):    IBU 800 MG tablet, Take 800 mg by mouth every 6 (six) hours as needed for mild pain or moderate pain.   traMADol (ULTRAM) 50 MG tablet, Take 1 tablet (50 mg total) by mouth every 6 (six) hours as needed. (Patient taking differently: Take 50 mg by mouth every 6 (six) hours as needed for moderate pain or severe pain.)   Current Outpatient Medications (Other):    beta carotene w/minerals (OCUVITE) tablet, Take 1 tablet by mouth daily. Unknown strength   clobetasol ointment (TEMOVATE) 0.05 %, APPLY TOPICALLY TWICE DAILY (Patient taking differently: Apply 1 application  topically 2 (two) times daily.)   glucose blood (FREESTYLE LITE) test strip, Use as  instructed once daily E11.9 (Patient taking differently: 1 each by Other route as needed for other (glucose). Use as instructed once daily E11.9)   ketoconazole (NIZORAL) 2 % cream, APPLY TOPICALLY TWICE DAILY AS NEEDED FOR IRRITATION (Patient taking differently: Apply 1 Application topically 2 (two) times daily as needed for irritation.)   Multiple Vitamin (MULTIVITAMIN WITH MINERALS) TABS tablet, Take 1 tablet by mouth daily. Unknown strength   Reviewed prior external information including notes and imaging from  primary care provider As well as notes that were available from care everywhere and other healthcare systems.  Past medical history, social, surgical and family history all reviewed in electronic medical record.  No pertanent information unless stated regarding to the chief complaint.   Review of Systems:  No headache, visual changes, nausea, vomiting, diarrhea, constipation, dizziness, abdominal pain, skin rash, fevers, chills, night sweats, weight loss, swollen lymph nodes, body aches, joint swelling, chest pain, shortness of breath, mood changes. POSITIVE muscle aches  Objective  There were no vitals taken for this visit.   General: No apparent distress alert and oriented x3 mood and affect normal, dressed appropriately.  HEENT: Pupils equal, extraocular movements intact  Respiratory: Patient's speak in full sentences and does not appear short of breath  Cardiovascular: No lower extremity edema, non tender, no erythema      Impression and Recommendations:

## 2023-01-19 ENCOUNTER — Ambulatory Visit: Payer: Medicare HMO | Admitting: Family Medicine

## 2023-01-23 ENCOUNTER — Other Ambulatory Visit: Payer: Self-pay

## 2023-01-23 ENCOUNTER — Other Ambulatory Visit: Payer: Self-pay | Admitting: Internal Medicine

## 2023-02-02 ENCOUNTER — Other Ambulatory Visit: Payer: Self-pay | Admitting: Internal Medicine

## 2023-02-03 ENCOUNTER — Ambulatory Visit: Payer: Medicare HMO | Admitting: Cardiology

## 2023-02-06 ENCOUNTER — Encounter: Payer: Self-pay | Admitting: Internal Medicine

## 2023-02-13 ENCOUNTER — Encounter: Payer: Self-pay | Admitting: Internal Medicine

## 2023-02-28 ENCOUNTER — Other Ambulatory Visit (INDEPENDENT_AMBULATORY_CARE_PROVIDER_SITE_OTHER): Payer: Self-pay

## 2023-02-28 ENCOUNTER — Other Ambulatory Visit: Payer: Self-pay | Admitting: Internal Medicine

## 2023-02-28 DIAGNOSIS — E1165 Type 2 diabetes mellitus with hyperglycemia: Secondary | ICD-10-CM | POA: Diagnosis not present

## 2023-02-28 DIAGNOSIS — Z0000 Encounter for general adult medical examination without abnormal findings: Secondary | ICD-10-CM

## 2023-02-28 DIAGNOSIS — E119 Type 2 diabetes mellitus without complications: Secondary | ICD-10-CM

## 2023-02-28 LAB — BASIC METABOLIC PANEL
BUN: 14 mg/dL (ref 6–23)
CO2: 27 meq/L (ref 19–32)
Calcium: 9.6 mg/dL (ref 8.4–10.5)
Chloride: 102 meq/L (ref 96–112)
Creatinine, Ser: 1.01 mg/dL (ref 0.40–1.50)
GFR: 67.53 mL/min (ref >60.00)
Glucose, Bld: 87 mg/dL (ref 70–99)
Potassium: 4.6 meq/L (ref 3.5–5.1)
Sodium: 138 meq/L (ref 135–145)

## 2023-02-28 LAB — CBC WITH DIFFERENTIAL/PLATELET
Basophils Absolute: 0 10*3/uL (ref 0.0–0.1)
Basophils Relative: 0.3 % (ref 0.0–3.0)
Eosinophils Absolute: 0.2 10*3/uL (ref 0.0–0.7)
Eosinophils Relative: 2.6 % (ref 0.0–5.0)
HCT: 40.9 % (ref 39.0–52.0)
Hemoglobin: 13.8 g/dL (ref 13.0–17.0)
Lymphocytes Relative: 23.8 % (ref 12.0–46.0)
Lymphs Abs: 2.2 10*3/uL (ref 0.7–4.0)
MCHC: 33.8 g/dL (ref 30.0–36.0)
MCV: 97.5 fL (ref 78.0–100.0)
Monocytes Absolute: 0.5 10*3/uL (ref 0.1–1.0)
Monocytes Relative: 5.6 % (ref 3.0–12.0)
Neutro Abs: 6.3 10*3/uL (ref 1.4–7.7)
Neutrophils Relative %: 67.7 % (ref 43.0–77.0)
Platelets: 134 10*3/uL — ABNORMAL LOW (ref 150.0–400.0)
RBC: 4.2 Mil/uL — ABNORMAL LOW (ref 4.22–5.81)
RDW: 14.4 % (ref 11.5–15.5)
WBC: 9.2 10*3/uL (ref 4.0–10.5)

## 2023-02-28 LAB — LIPID PANEL
Cholesterol: 125 mg/dL (ref 0–200)
HDL: 44.5 mg/dL (ref >39.00)
LDL Cholesterol: 65 mg/dL (ref 0–99)
NonHDL: 80.77
Total CHOL/HDL Ratio: 3
Triglycerides: 77 mg/dL (ref 0.0–149.0)
VLDL: 15.4 mg/dL (ref 0.0–40.0)

## 2023-02-28 LAB — URINALYSIS, ROUTINE W REFLEX MICROSCOPIC
Bilirubin Urine: NEGATIVE
Hgb urine dipstick: NEGATIVE
Ketones, ur: NEGATIVE
Nitrite: NEGATIVE
Specific Gravity, Urine: 1.01 (ref 1.000–1.030)
Total Protein, Urine: NEGATIVE
Urine Glucose: NEGATIVE
Urobilinogen, UA: 1 (ref 0.0–1.0)
pH: 6 (ref 5.0–8.0)

## 2023-02-28 LAB — HEMOGLOBIN A1C: Hgb A1c MFr Bld: 6.5 % (ref 4.6–6.5)

## 2023-02-28 LAB — HEPATIC FUNCTION PANEL
ALT: 17 U/L (ref 0–53)
AST: 16 U/L (ref 0–37)
Albumin: 4.3 g/dL (ref 3.5–5.2)
Alkaline Phosphatase: 80 U/L (ref 39–117)
Bilirubin, Direct: 0.2 mg/dL (ref 0.0–0.3)
Total Bilirubin: 1.1 mg/dL (ref 0.2–1.2)
Total Protein: 7.5 g/dL (ref 6.0–8.3)

## 2023-02-28 LAB — MICROALBUMIN / CREATININE URINE RATIO
Creatinine,U: 90.6 mg/dL
Microalb Creat Ratio: 1.2 mg/g (ref 0.0–30.0)
Microalb, Ur: 1.1 mg/dL (ref 0.0–1.9)

## 2023-02-28 LAB — TSH: TSH: 2.27 u[IU]/mL (ref 0.35–5.50)

## 2023-03-03 ENCOUNTER — Encounter: Payer: Self-pay | Admitting: Internal Medicine

## 2023-03-03 ENCOUNTER — Ambulatory Visit (INDEPENDENT_AMBULATORY_CARE_PROVIDER_SITE_OTHER): Payer: Medicare (Managed Care) | Admitting: Internal Medicine

## 2023-03-03 VITALS — BP 100/60 | HR 55 | Temp 98.0°F | Ht 71.0 in | Wt 240.0 lb

## 2023-03-03 DIAGNOSIS — E559 Vitamin D deficiency, unspecified: Secondary | ICD-10-CM

## 2023-03-03 DIAGNOSIS — Z0001 Encounter for general adult medical examination with abnormal findings: Secondary | ICD-10-CM

## 2023-03-03 DIAGNOSIS — E538 Deficiency of other specified B group vitamins: Secondary | ICD-10-CM

## 2023-03-03 DIAGNOSIS — I10 Essential (primary) hypertension: Secondary | ICD-10-CM

## 2023-03-03 DIAGNOSIS — R3912 Poor urinary stream: Secondary | ICD-10-CM

## 2023-03-03 DIAGNOSIS — N401 Enlarged prostate with lower urinary tract symptoms: Secondary | ICD-10-CM | POA: Diagnosis not present

## 2023-03-03 DIAGNOSIS — E782 Mixed hyperlipidemia: Secondary | ICD-10-CM

## 2023-03-03 DIAGNOSIS — Z0000 Encounter for general adult medical examination without abnormal findings: Secondary | ICD-10-CM

## 2023-03-03 DIAGNOSIS — Z7984 Long term (current) use of oral hypoglycemic drugs: Secondary | ICD-10-CM

## 2023-03-03 DIAGNOSIS — E1165 Type 2 diabetes mellitus with hyperglycemia: Secondary | ICD-10-CM

## 2023-03-03 MED ORDER — GLIPIZIDE ER 10 MG PO TB24: 10.0000 mg | ORAL_TABLET | Freq: Every day | ORAL | 3 refills | Status: AC

## 2023-03-03 MED ORDER — FREESTYLE LITE DEVI: 0 refills | Status: DC

## 2023-03-03 MED ORDER — FREESTYLE LITE TEST VI STRP: ORAL_STRIP | 3 refills | Status: AC

## 2023-03-03 MED ORDER — TAMSULOSIN HCL 0.4 MG PO CAPS: 0.4000 mg | ORAL_CAPSULE | Freq: Every day | ORAL | 3 refills | Status: DC

## 2023-03-03 MED ORDER — LANCETS MISC: 3 refills | Status: DC

## 2023-03-03 MED ORDER — TRAMADOL HCL 50 MG PO TABS: 50.0000 mg | ORAL_TABLET | Freq: Four times a day (QID) | ORAL | 0 refills | Status: DC | PRN

## 2023-03-03 MED ORDER — ATORVASTATIN CALCIUM 80 MG PO TABS: 80.0000 mg | ORAL_TABLET | Freq: Every day | ORAL | 3 refills | Status: AC

## 2023-03-03 MED ORDER — METFORMIN HCL 500 MG PO TABS: 500.0000 mg | ORAL_TABLET | Freq: Two times a day (BID) | ORAL | 3 refills | Status: AC

## 2023-03-03 NOTE — Patient Instructions (Addendum)
Please follow with cardiology as you mentioned  Please take all new medication as prescribed - the flomax 1 per day  Your script for glucometer and strips were sent   Please continue all other medications as before, and refills have been done if requested.  Please have the pharmacy call with any other refills you may need.  Please continue your efforts at being more active, low cholesterol diet, and weight control.  You are otherwise up to date with prevention measures today.  Please keep your appointments with your specialists as you may have planned  Please make an Appointment to return in 6 months, or sooner if needed, also with Lab Appointment for testing done 3-5 days before at the FIRST FLOOR Lab (so this is for TWO appointments - please see the scheduling desk as you leave)

## 2023-03-03 NOTE — Progress Notes (Unsigned)
[name] [address] [place] [address] Phone: [phone] Subjective:   [name], am serving as a scribe for Dr. Antoine Primas.  I'm seeing this patient by the request  of:  Corwin Levins, MD  CC: Low back pain  QIO:[id number]  10/12/2022 Concern at this point the patient may have some progression of the spinal stenosis.  Patient has failed all conservative therapy including injections in the sacroiliac joint, home exercises, formal physical therapy.  Do feel that an MRI would be beneficial to see if there is some other potential injections that could be helpful.  Patient wants to hold on anything such as gabapentin secondary to low blood pressure on this.  Also did not feel like it made any significant difference otherwise.  Patient will follow-up after imaging and we will discuss further treatment options     Updated 03/06/2023 [name] is a 89 y.o. male coming in with complaint of back pain. Epidural 11/10/2022. Epidural worked. Nerve pain from shingles from over a year ago over R chest.       Past Medical History:  Diagnosis Date   AAA (abdominal aortic aneurysm) (HCC)    MONITORED BY DR Hart [place] LOV JUNE 2014--  STABLE  AT 3.7   Abdominal aneurysm without mention of rupture 07/24/2012   Acute sinus infection 06/17/2009   Qualifier: Diagnosis of  By: Jonny Ruiz MD, Len Blalock    Allergic rhinitis    Anxiety state 07/20/2007   Arthritis    Asymptomatic stenosis of left carotid artery without infarction    MILD DISEASE   Bilateral carotid artery stenosis    BPH (benign prostatic hypertrophy)    Coronary artery calcification seen on CT scan 09/05/2017   Diabetes mellitus type 2, noninsulin dependent (HCC) 07/20/2007   Dilated cardiomyopathy (HCC) 12/14/2018   Diverticulosis of colon    Hearing loss of both ears 10/23/2013   History of cardiomyopathy    IDIOPATHIC-- PER [facility] NOTE 2013 , CURRENTLY RESOLVED   History of kidney stones     HTN (hypertension) 01/14/2016   Hypercalcemia 03/15/2019   Hyperlipidemia    Hypertension    Hypertensive heart disease without CHF    Obesity (BMI 30-39.9)    Paresthesia of both feet 07/14/2016   Peripheral vascular disease (HCC)    Personal history of kidney stones    Phimosis    PHN (postherpetic neuralgia) 09/18/2019   Preop exam for internal medicine 05/10/2016   Preventative health care 01/14/2016   Rash 05/01/2015   Right carotid artery occlusion    CHRONIC   WITHOUT INFARTION   Rotator cuff arthropathy of both shoulders 04/10/2019   Spinal stenosis at L4-L5 level 06/08/2016   Stroke (HCC)     Right eye   Type 2 diabetes mellitus (HCC)    Wears hearing aid    Past Surgical History:  Procedure Laterality Date   CARDIOVASCULAR STRESS TEST  JUNE 2008  DR Donnie Aho   CATARACT EXTRACTION W/ INTRAOCULAR LENS IMPLANT Right    CIRCUMCISION N/A 08/30/2012   Procedure: CIRCUMCISION ADULT;  Surgeon: Anner Crete, MD;  Location: [facility];  Service: Urology;  Laterality: N/A;   CYSTO/  RIGHT URETERAL STENT PLACEMENT  10-16-2003   EXTRACORPOREAL SHOCK WAVE LITHOTRIPSY Right 2005   EYE SURGERY     JOINT REPLACEMENT Bilateral 2008  2009   Knee   LUMBAR LAMINECTOMY/DECOMPRESSION MICRODISCECTOMY Bilateral 06/08/2016   Procedure: Bilateral Microlumbar decompression L4-L5 and L5-S1;  Surgeon: Jene Every, MD;  Location: [facility];  Service: Orthopedics;  Laterality: Bilateral;  Requests 2 hours   TOTAL KNEE ARTHROPLASTY Bilateral RIGHT 10-08-2007;   LEFT 09-30-2008   TRANSTHORACIC ECHOCARDIOGRAM  10-21-2009   LVEF 50%   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  Tobacco Use   Smoking status: Former     Current packs/day: 0.00    Average packs/day: 0.8 packs/day for 25.0 years (18.8 ttl pk-yrs)    Types: Cigarettes    Start date: 07/24/1957    Quit date: 07/25/1982    Years since quitting: 40.6   Smokeless tobacco: Never  Vaping Use   Vaping status: Never Used  Substance and Sexual Activity   Alcohol use: No    Alcohol/week: 0.0 standard drinks of alcohol    Comment: recovering alcoholic  +91yrs   Drug use: No   Sexual activity: Not on file  Other Topics Concern   Not on file  Social History Narrative   Not on file   Social Drivers of Health   Financial Resource Strain: Low Risk  (08/06/2021)   Overall Financial Resource Strain (CARDIA)    Difficulty of Paying Living Expenses: Not hard at all  Food Insecurity: No Food Insecurity (08/06/2021)   Hunger Vital Sign    Worried About Running Out of Food in the Last Year: Never true    Ran Out of Food in the Last Year: Never true  Transportation Needs: No Transportation Needs (08/06/2021)   PRAPARE - Administrator, Civil Service (Medical): No    Lack of Transportation (Non-Medical): No  Physical Activity: Sufficiently Active (08/06/2021)   Exercise Vital Sign    Days of Exercise per Week: 5 days    Minutes of Exercise per Session: 30 min  Stress: No Stress Concern Present (08/06/2021)   [employer] of Occupational Health - Occupational Stress Questionnaire    Feeling of Stress : Not at all  Social Connections: Moderately Integrated (08/06/2021)   Social Connection and Isolation Panel [NHANES]    Frequency of Communication with Friends and Family: More than three times a week    Frequency of Social Gatherings with Friends and Family: More than three times a week    Attends Religious Services: 1 to 4 times per year    Active Member of [employer] or Organizations: No    Attends Banker Meetings: 1 to 4 times per year    Marital Status: Widowed   No Known Allergies Family History  Problem Relation Age of  Onset   Heart disease Mother        CHF  Before age 21   COPD Mother    Kidney disease Mother    Hypertension Mother    Asthma Mother    Depression Mother    ALS Father    Diabetes Father    Colon cancer Neg Hx     Current Outpatient Medications (Endocrine & Metabolic):    glipiZIDE (GLUCOTROL XL) 10 MG 24 hr tablet, Take 1 tablet (10 mg total) by mouth daily with breakfast.   metFORMIN (GLUCOPHAGE) 500 MG tablet, Take 1 tablet (500 mg total)  by mouth 2 (two) times daily with a meal.  Current Outpatient Medications (Cardiovascular):    amiodarone (PACERONE) 200 MG tablet, Take 0.5 tablets (100 mg total) by mouth daily.   atorvastatin (LIPITOR) 80 MG tablet, Take 1 tablet (80 mg total) by mouth daily.   furosemide (LASIX) 40 MG tablet, Take 0.5 tablets (20 mg total) by mouth daily.   sacubitril-valsartan (ENTRESTO) 24-26 MG, Take 1 tablet by mouth 2 (two) times daily.   sildenafil (VIAGRA) 100 MG tablet, TAKE 1/2 TO 1 TABLET BY MOUTH DAILY AS NEEDED FOR ERECTILE DYSFUNCTION (Patient taking differently: Take 25-50 mg by mouth as needed for erectile dysfunction.)  Current Outpatient Medications (Respiratory):    triamcinolone (NASACORT) 55 MCG/ACT AERO nasal inhaler, Place 2 sprays into the nose daily.  Current Outpatient Medications (Analgesics):    IBU 800 MG tablet, Take 800 mg by mouth every 6 (six) hours as needed for mild pain or moderate pain.   traMADol (ULTRAM) 50 MG tablet, Take 1 tablet (50 mg total) by mouth every 6 (six) hours as needed.   Current Outpatient Medications (Other):    acyclovir (ZOVIRAX) 400 MG tablet, Take 1 tablet (400 mg total) by mouth 3 (three) times daily.   beta carotene w/minerals (OCUVITE) tablet, Take 1 tablet by mouth daily. Unknown strength   Blood Glucose Monitoring Suppl (FREESTYLE LITE) DEVI, Use as directed once daily E11.9   clobetasol ointment (TEMOVATE) 0.05 %, APPLY TOPICALLY TWICE DAILY (Patient taking differently: Apply 1 application   topically 2 (two) times daily.)   glucose blood (FREESTYLE LITE) test strip, Use as instructed once daily E11.9   ketoconazole (NIZORAL) 2 % cream, APPLY TOPICALLY TWICE DAILY AS NEEDED FOR IRRITATION (Patient taking differently: Apply 1 Application topically 2 (two) times daily as needed for irritation.)   Lancets MISC, Use as directed once daily E11.9   Multiple Vitamin (MULTIVITAMIN WITH MINERALS) TABS tablet, Take 1 tablet by mouth daily. Unknown strength   tamsulosin (FLOMAX) 0.4 MG CAPS capsule, Take 1 capsule (0.4 mg total) by mouth daily.    Objective  Blood pressure 110/74, pulse (!) 56, height 5\' 11"  (1.803 m), SpO2 95%.   General: No apparent distress alert and oriented x3 mood and affect normal, dressed appropriately.  Mild hard of hearing HEENT: Pupils equal, extraocular movements intact  Respiratory: Patient's speak in full sentences and does not appear short of breath  Cardiovascular: No lower extremity edema, non tender, no erythema  Neck exam does have some loss lordosis noted.   Low back does have loss of lordosis, able to get up from sitting position  Mild tightness with FABER noted.  Negative straight leg test noted.    Impression and Recommendations:     The above documentation has been reviewed and is accurate and complete Judi Saa, DO

## 2023-03-03 NOTE — Progress Notes (Unsigned)
Patient ID: [name], male   DOB: [birth year], 89 y.o.   MRN: [id number]         Chief Complaint:: wellness exam and low b12, htn, hld, dm, low vit d      HPI:  [name] is a 89 y.o. male here for wellness exam; declines covid booster, o/w up to date                        Also SBP at the eval for cocklear implant last wk was 90.  Pt denies chest pain, increased sob or doe, wheezing, orthopnea, PND, increased LE swelling, palpitations, dizziness or syncope. Pt denies polydipsia, polyuria, or new focal neuro s/s.    Pt denies fever, wt loss, night sweats, loss of appetite, or other constitutional symptoms  Needs new glucometer. Denies urinary symptoms such as dysuria, frequency, urgency, flank pain, hematuria or n/v, fever, chills but has weak stream, was on flomax a few years ago, asks to restart.     Wt Readings from Last 3 Encounters:  03/03/23 240 lb (108.9 kg)  10/12/22 230 lb (104.3 kg)  10/11/22 229 lb (103.9 kg)   BP Readings from Last 3 Encounters:  03/06/23 110/74  03/03/23 100/60  12/08/22 (!) 140/78   Immunization History  Administered Date(s) Administered   Fluad Quad(high Dose 65+) 12/14/2018, 10/27/2021, 11/13/2022   Influenza Whole 12/08/2008   Influenza, High Dose Seasonal PF 11/02/2017, 11/15/2019   Influenza,inj,Quad PF,6+ Mos 10/23/2013, 10/24/2014, 12/12/2016   Influenza-Unspecified 01/19/2021   Moderna Covid-19 Vaccine Bivalent Booster 17yrs & up 11/02/2020   PFIZER(Purple Top)SARS-COV-2 Vaccination 04/08/2019, 05/09/2019, 12/11/2019, 11/02/2020   Pneumococcal Conjugate-13 04/19/2013   Pneumococcal Polysaccharide-23 08/22/2008   Td 02/07/1993, 08/22/2008   Tdap 09/12/2018   Zoster Recombinant(Shingrix) 05/24/2017, 09/26/2017   Health Maintenance Due  Topic Date Due   COVID-19 Vaccine (5 - 2024-25 season) 10/09/2022      Past Medical History:  Diagnosis Date   AAA (abdominal aortic aneurysm) (HCC)    MONITORED BY DR Hart [place] LOV JUNE 2014--   STABLE  AT 3.7   Abdominal aneurysm without mention of rupture 07/24/2012   Acute sinus infection 06/17/2009   Qualifier: Diagnosis of  By: Jonny Ruiz MD, Len Blalock    Allergic rhinitis    Anxiety state 07/20/2007   Arthritis    Asymptomatic stenosis of left carotid artery without infarction    MILD DISEASE   Bilateral carotid artery stenosis    BPH (benign prostatic hypertrophy)    Coronary artery calcification seen on CT scan 09/05/2017   Diabetes mellitus type 2, noninsulin dependent (HCC) 07/20/2007   Dilated cardiomyopathy (HCC) 12/14/2018   Diverticulosis of colon    Hearing loss of both ears 10/23/2013   History of cardiomyopathy    IDIOPATHIC-- PER [facility] NOTE 2013 , CURRENTLY RESOLVED   History of kidney stones    HTN (hypertension) 01/14/2016   Hypercalcemia 03/15/2019   Hyperlipidemia    Hypertension    Hypertensive heart disease without CHF    Obesity (BMI 30-39.9)    Paresthesia of both feet 07/14/2016   Peripheral vascular disease (HCC)    Personal history of kidney stones    Phimosis    PHN (postherpetic neuralgia) 09/18/2019   Preop exam for internal medicine 05/10/2016   Preventative health care 01/14/2016   Rash 05/01/2015   Right carotid artery occlusion    CHRONIC   WITHOUT INFARTION   Rotator cuff arthropathy of both shoulders 04/10/2019  Spinal stenosis at L4-L5 level 06/08/2016   Stroke [facility])     Right eye   Type 2 diabetes mellitus (HCC)    Wears hearing aid    Past Surgical History:  Procedure Laterality Date   CARDIOVASCULAR STRESS TEST  JUNE 2008  DR TILLEY   CATARACT EXTRACTION W/ INTRAOCULAR LENS IMPLANT Right    CIRCUMCISION N/A 08/30/2012   Procedure: CIRCUMCISION ADULT;  Surgeon: Anner Crete, MD;  Location: [facility];  Service: Urology;  Laterality: N/A;   CYSTO/  RIGHT URETERAL STENT PLACEMENT  10-16-2003   EXTRACORPOREAL SHOCK WAVE LITHOTRIPSY Right 2005   EYE SURGERY     JOINT REPLACEMENT Bilateral 2008  2009   Knee   LUMBAR  LAMINECTOMY/DECOMPRESSION MICRODISCECTOMY Bilateral 06/08/2016   Procedure: Bilateral Microlumbar decompression L4-L5 and L5-S1;  Surgeon: Jene Every, MD;  Location: [facility];  Service: Orthopedics;  Laterality: Bilateral;  Requests 2 hours   TOTAL KNEE ARTHROPLASTY Bilateral RIGHT 10-08-2007;   LEFT 09-30-2008   TRANSTHORACIC ECHOCARDIOGRAM  10-21-2009   LVEF 50%    reports that he quit smoking about 40 years ago. His smoking use included cigarettes. He started smoking about 65 years ago. He has a 18.8 pack-year smoking history. He has never used smokeless tobacco. He reports that he does not drink alcohol and does not use drugs. family history includes ALS in his father; Asthma in his mother; COPD in his mother; Depression in his mother; Diabetes in his father; Heart disease in his mother; Hypertension in his mother; Kidney disease in his mother. No Known Allergies Current Outpatient Medications on File Prior to Visit  Medication Sig Dispense Refill   amiodarone (PACERONE) 200 MG tablet Take 0.5 tablets (100 mg total) by mouth daily. 45 tablet 1   beta carotene w/minerals (OCUVITE) tablet Take 1 tablet by mouth daily. Unknown strength     clobetasol ointment (TEMOVATE) 0.05 % APPLY TOPICALLY TWICE DAILY (Patient taking differently: Apply 1 application  topically 2 (two) times daily.) 30 g 1   furosemide (LASIX) 40 MG tablet Take 0.5 tablets (20 mg total) by mouth daily. 45 tablet 3   IBU 800 MG tablet Take 800 mg by mouth every 6 (six) hours as needed for mild pain or moderate pain.     ketoconazole (NIZORAL) 2 % cream APPLY TOPICALLY TWICE DAILY AS NEEDED FOR IRRITATION (Patient taking differently: Apply 1 Application topically 2 (two) times daily as needed for irritation.) 60 g 1   Multiple Vitamin (MULTIVITAMIN WITH MINERALS) TABS tablet Take 1 tablet by mouth daily. Unknown strength     sacubitril-valsartan (ENTRESTO) 24-26 MG Take 1 tablet by mouth 2 (two) times daily. 60 tablet 6    sildenafil (VIAGRA) 100 MG tablet TAKE 1/2 TO 1 TABLET BY MOUTH DAILY AS NEEDED FOR ERECTILE DYSFUNCTION (Patient taking differently: Take 25-50 mg by mouth as needed for erectile dysfunction.) 5 tablet 11   triamcinolone (NASACORT) 55 MCG/ACT AERO nasal inhaler Place 2 sprays into the nose daily. 1 each 12   No current facility-administered medications on file prior to visit.        ROS:  All others reviewed and negative.  Objective        PE:  BP 100/60 (BP Location: Right Arm, Patient Position: Sitting, Cuff Size: Normal)   Pulse (!) 55   Temp 98 F (36.7 C) (Oral)   Ht 5\' 11"  (1.803 m)   Wt 240 lb (108.9 kg)   SpO2 98%   BMI 33.47 kg/m  Constitutional: Pt appears in NAD               HENT: Head: NCAT.                Right Ear: External ear normal.                 Left Ear: External ear normal.                Eyes: . Pupils are equal, round, and reactive to light. Conjunctivae and EOM are normal               Nose: without d/c or deformity               Neck: Neck supple. Gross normal ROM               Cardiovascular: Normal rate and regular rhythm.                 Pulmonary/Chest: Effort normal and breath sounds without rales or wheezing.                Abd:  Soft, NT, ND, + BS, no organomegaly               Neurological: Pt is alert. At baseline orientation, motor grossly intact               Skin: Skin is warm. No rashes, no other new lesions, LE edema - none               Psychiatric: Pt behavior is normal without agitation   Micro: none  Cardiac tracings I have personally interpreted today:  none  Pertinent Radiological findings (summarize): none   Lab Results  Component Value Date   WBC 9.2 02/28/2023   HGB 13.8 02/28/2023   HCT 40.9 02/28/2023   PLT 134.0 (L) 02/28/2023   GLUCOSE 87 02/28/2023   CHOL 125 02/28/2023   TRIG 77.0 02/28/2023   HDL 44.50 02/28/2023   LDLDIRECT 92.0 02/06/2017   LDLCALC 65 02/28/2023   ALT 17 02/28/2023   AST 16  02/28/2023   NA 138 02/28/2023   K 4.6 02/28/2023   CL 102 02/28/2023   CREATININE 1.01 02/28/2023   BUN 14 02/28/2023   CO2 27 02/28/2023   TSH 2.27 02/28/2023   PSA 2.15 07/14/2016   INR 1.1 09/24/2008   HGBA1C 6.5 02/28/2023   MICROALBUR 1.1 02/28/2023   Assessment/Plan:  [name] is a 89 y.o. White or Caucasian [1] male with  has a past medical history of AAA (abdominal aortic aneurysm) (HCC), Abdominal aneurysm without mention of rupture (07/24/2012), Acute sinus infection (06/17/2009), Allergic rhinitis, Anxiety state (07/20/2007), Arthritis, Asymptomatic stenosis of left carotid artery without infarction, Bilateral carotid artery stenosis, BPH (benign prostatic hypertrophy), Coronary artery calcification seen on CT scan (09/05/2017), Diabetes mellitus type 2, noninsulin dependent (HCC) (07/20/2007), Dilated cardiomyopathy (HCC) (12/14/2018), Diverticulosis of colon, Hearing loss of both ears (10/23/2013), History of cardiomyopathy, History of kidney stones, HTN (hypertension) (01/14/2016), Hypercalcemia (03/15/2019), Hyperlipidemia, Hypertension, Hypertensive heart disease without CHF, Obesity (BMI 30-39.9), Paresthesia of both feet (07/14/2016), Peripheral vascular disease (HCC), Personal history of kidney stones, Phimosis, PHN (postherpetic neuralgia) (09/18/2019), Preop exam for internal medicine (05/10/2016), Preventative health care (01/14/2016), Rash (05/01/2015), Right carotid artery occlusion, Rotator cuff arthropathy of both shoulders (04/10/2019), Spinal stenosis at L4-L5 level (06/08/2016), Stroke (HCC), Type 2 diabetes mellitus (HCC), and Wears hearing aid.  Encounter for well adult exam with abnormal  findings Age and sex appropriate education and counseling updated with regular exercise and diet Referrals for preventative services - none needed Immunizations addressed - declines covid booster Smoking counseling  - none needed Evidence for depression or other mood disorder - none  significant Most recent labs reviewed. I have personally reviewed and have noted: 1) the patient's medical and social history 2) The patient's current medications and supplements 3) The patient's height, weight, and BMI have been recorded in the chart   B12 deficiency Lab Results  Component Value Date   VITAMINB12 314 02/22/2022   Stable, cont oral replacement - b12 1000 mcg qd   HTN (hypertension) BP Readings from Last 3 Encounters:  03/06/23 110/74  03/03/23 100/60  12/08/22 (!) 140/78   Low normal recently, pt to continue medical treatment entresto and f/u cardiology as planned    Hyperlipidemia Lab Results  Component Value Date   LDLCALC 65 02/28/2023   Stable, pt to continue current statin lipitor 80 qd   Type 2 diabetes mellitus (HCC) Lab Results  Component Value Date   HGBA1C 6.5 02/28/2023   Stable, pt to continue current medical treatment glucotrol xl 10 every day, metformin 500 bid   Vitamin D deficiency Last vitamin D Lab Results  Component Value Date   VD25OH 37.41 08/24/2022   Low, to start oral replacement   Benign prostatic hyperplasia With mild worsening, for restart flomax 1 qd  Followup: Return in about 6 months (around 08/31/2023).  Oliver Barre, MD 03/06/2023 9:53 PM  [facility]

## 2023-03-06 ENCOUNTER — Encounter: Payer: Self-pay | Admitting: Internal Medicine

## 2023-03-06 ENCOUNTER — Ambulatory Visit (INDEPENDENT_AMBULATORY_CARE_PROVIDER_SITE_OTHER): Payer: Medicare (Managed Care) | Admitting: Family Medicine

## 2023-03-06 VITALS — BP 110/74 | HR 56 | Ht 71.0 in

## 2023-03-06 DIAGNOSIS — M5416 Radiculopathy, lumbar region: Secondary | ICD-10-CM | POA: Diagnosis not present

## 2023-03-06 DIAGNOSIS — B0229 Other postherpetic nervous system involvement: Secondary | ICD-10-CM

## 2023-03-06 DIAGNOSIS — M48061 Spinal stenosis, lumbar region without neurogenic claudication: Secondary | ICD-10-CM

## 2023-03-06 MED ORDER — ACYCLOVIR 400 MG PO TABS: 400.0000 mg | ORAL_TABLET | Freq: Three times a day (TID) | ORAL | 0 refills | Status: AC

## 2023-03-06 NOTE — Assessment & Plan Note (Signed)
Last vitamin D Lab Results  Component Value Date   VD25OH 37.41 08/24/2022   Low, to start oral replacement

## 2023-03-06 NOTE — Patient Instructions (Addendum)
See me as needed Acyclovir 400mg  3x a day 5days

## 2023-03-06 NOTE — Assessment & Plan Note (Signed)
Lab Results  Component Value Date   LDLCALC 65 02/28/2023   Stable, pt to continue current statin lipitor 80 qd

## 2023-03-06 NOTE — Assessment & Plan Note (Signed)

## 2023-03-06 NOTE — Assessment & Plan Note (Signed)
BP Readings from Last 3 Encounters:  03/06/23 110/74  03/03/23 100/60  12/08/22 (!) 140/78   Low normal recently, pt to continue medical treatment entresto and f/u cardiology as planned

## 2023-03-06 NOTE — Assessment & Plan Note (Signed)
Will attempt acyclovir.  Warned of potential side effects.  May need to see if he does make improvement would consider prophylactic treatment otherwise.

## 2023-03-06 NOTE — Assessment & Plan Note (Signed)
Lab Results  Component Value Date   HGBA1C 6.5 02/28/2023   Stable, pt to continue current medical treatment glucotrol xl 10 every day, metformin 500 bid

## 2023-03-06 NOTE — Assessment & Plan Note (Signed)
With mild worsening, for restart flomax 1 qd

## 2023-03-06 NOTE — Assessment & Plan Note (Signed)
Lab Results  Component Value Date   VITAMINB12 314 02/22/2022   Stable, cont oral replacement - b12 1000 mcg qd

## 2023-03-06 NOTE — Assessment & Plan Note (Signed)
Known arthritic changes.  Discussed icing regimen and home exercises, discussed core strengthening and weight loss.  Responded extremely well though to epidural.  Encourage patient to continue to be active.  Worsening pain will consider the possibility of other treatment options.  Follow-up again in 6 to 8 weeks

## 2023-03-06 NOTE — Assessment & Plan Note (Signed)
Patient does have some loss lordosis.  Patient responded extremely well to the epidural.  Increase activity slowly otherwise.  Follow-up with me again in 6 to 8 weeks.  Follow-up again if you need anything else at this time.  Can repeat epidurals every 3 months if necessary.  Will continue to help patient stay as active as possible.

## 2023-03-09 IMAGING — DX DG CHEST 2V
2 series · 2 of 2 positions shown · non-contrast
Comparison: Chest radiograph 10/02/2007; CT chest 04/29/2021.

CLINICAL DATA: Shortness of breath for 1 month.

EXAM:
CHEST - 2 VIEW

[chest lat]
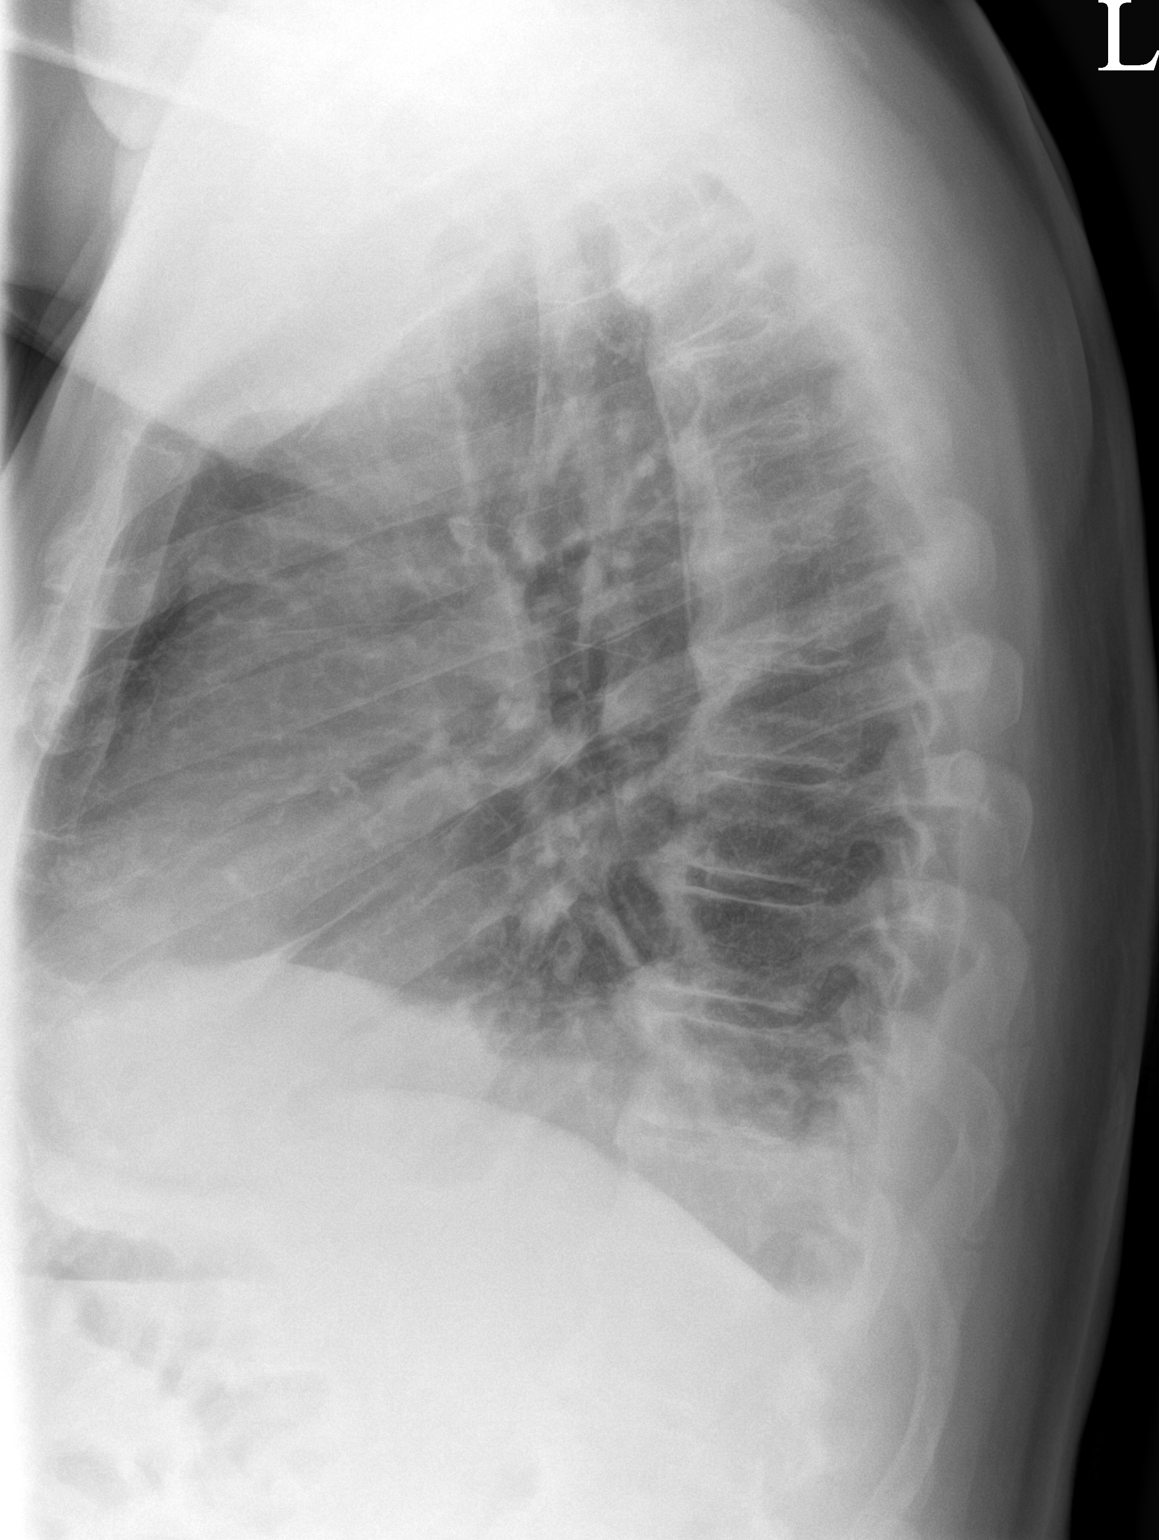

[chest pa]
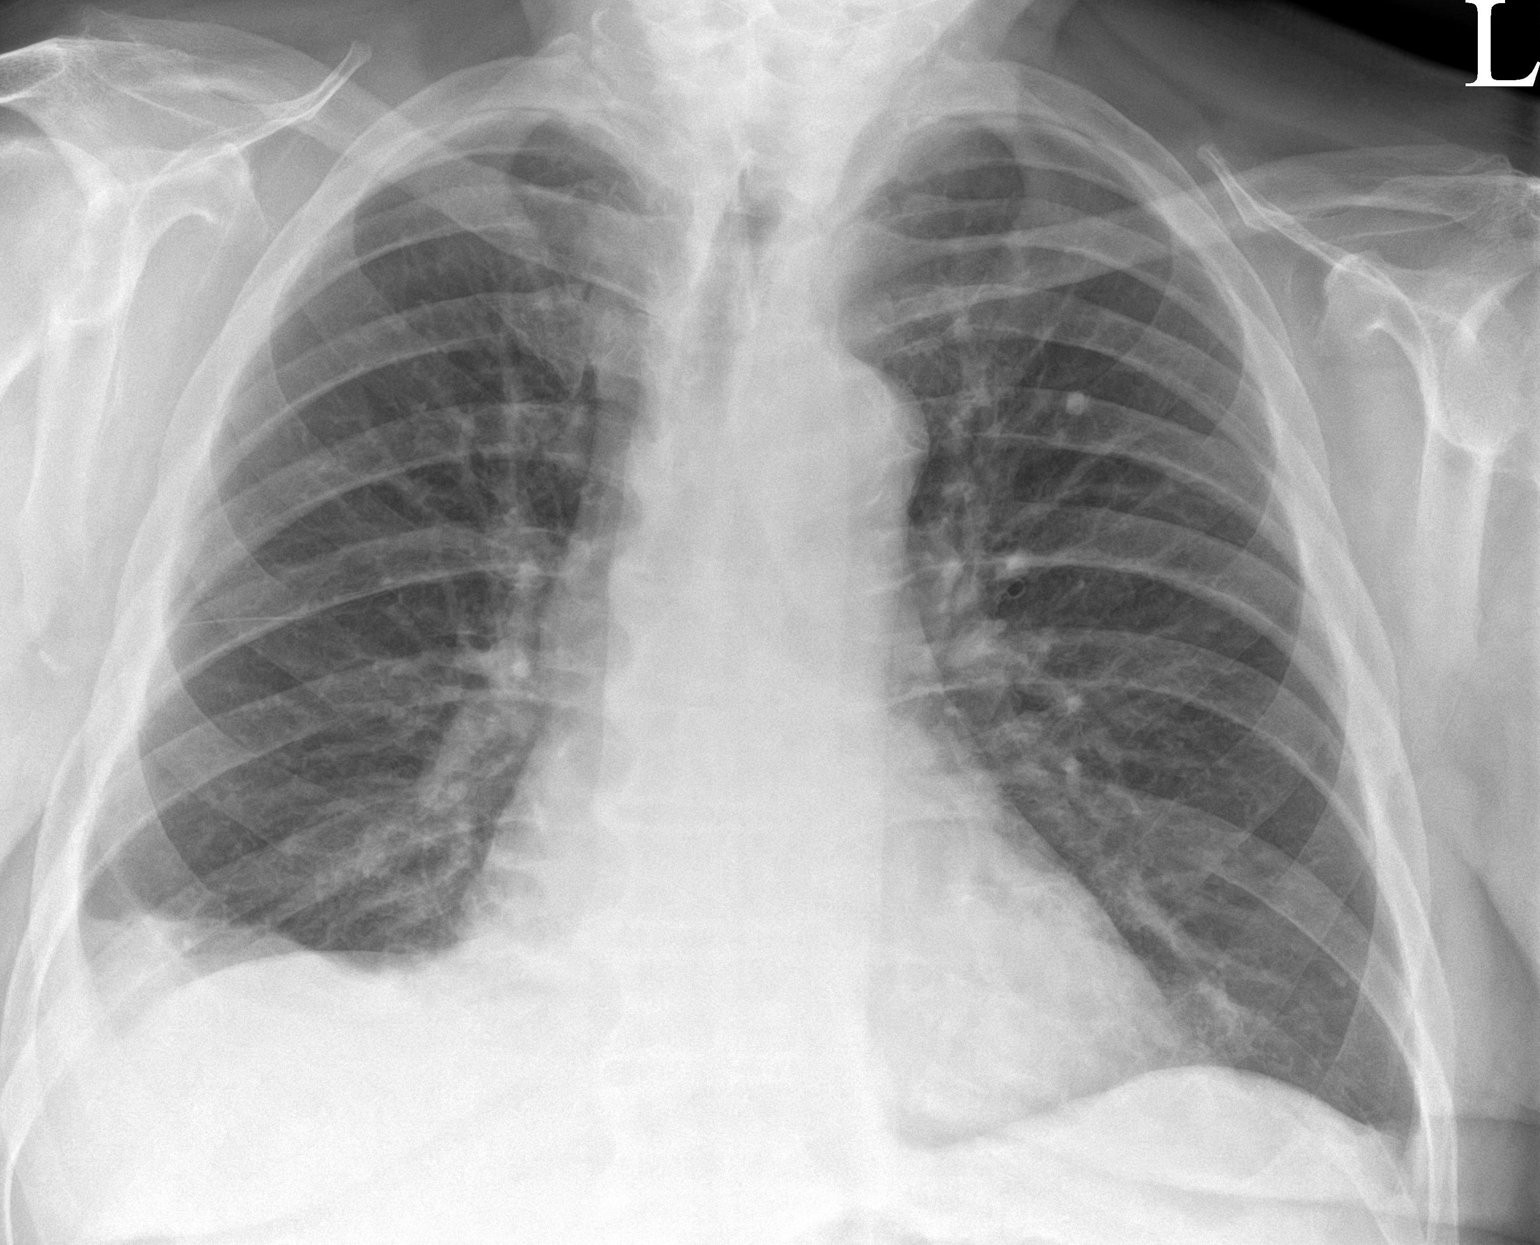

[2 of 2 positions shown; findings below may reference images not displayed]

FINDINGS: Stable cardiac and mediastinal contours. Small right pleural
effusion and underlying opacities. No pneumothorax. Osseous
structures unremarkable.
IMPRESSION: Small right pleural effusion and underlying opacities favored to
represent atelectasis.

## 2023-03-16 ENCOUNTER — Encounter (HOSPITAL_COMMUNITY): Payer: Self-pay

## 2023-04-14 ENCOUNTER — Ambulatory Visit: Payer: Medicare (Managed Care) | Admitting: Student

## 2023-04-17 NOTE — Progress Notes (Unsigned)
  Electrophysiology Office Note:   Date:  04/18/2023  ID:  [name], DOB [birth year], MRN [id number]  Primary Cardiologist: Joshua Balsam, MD Electrophysiologist: Joshua Bunting, MD      History of Present Illness:   [name] is a 89 y.o. male with h/o PVCs, HTN, and dyspnea seen today for routine electrophysiology followup.   Since last being seen in our clinic the patient reports doing well from a cardiac perspective. Sees. Dr. [name] every 3 months. Currently,  he denies chest pain, palpitations, dyspnea, PND, orthopnea, nausea, vomiting, dizziness, syncope, edema, weight gain, or early satiety.   Review of systems complete and found to be negative unless listed in HPI.   EP Information / Studies Reviewed:    EKG is ordered today. Personal review as below.  EKG Interpretation Date/Time:  Tuesday April 18 2023 11:23:07 EDT Ventricular Rate:  60 PR Interval:  164 QRS Duration:  140 QT Interval:  468 QTC Calculation: 468 R Axis:   15  Text Interpretation: Sinus rhythm with occasional Premature ventricular complexes Non-specific intra-ventricular conduction block Inferior infarct (cited on or before 15-Oct-2003) Cannot rule out Anterior infarct (cited on or before 15-Oct-2003) When compared with ECG of 11-Oct-2022 09:51, Premature ventricular complexes are now Present Nonspecific T wave abnormality, worse in Inferior leads Confirmed by Maxine Glenn [phone]) on 04/18/2023 11:38:21 AM    Arrhythmia/Device History No specialty comments available.   Physical Exam:   VS:  BP 112/68   Pulse 60   Ht 5\' 11"  (1.803 m)   Wt 245 lb 6.4 oz (111.3 kg)   SpO2 94%   BMI 34.23 kg/m    Wt Readings from Last 3 Encounters:  04/18/23 245 lb 6.4 oz (111.3 kg)  03/03/23 240 lb (108.9 kg)  10/12/22 230 lb (104.3 kg)     GEN: No acute distress. Hard of hearing.  NECK: No JVD; No carotid bruits CARDIAC: Regular rate and rhythm, no murmurs, rubs, gallops RESPIRATORY:  Clear  to auscultation without rales, wheezing or rhonchi  ABDOMEN: Soft, non-tender, non-distended EXTREMITIES:  No edema; No deformity   ASSESSMENT AND PLAN:    PVCs EKG today shows NSR with a PVC Continue amiodarone 100 mg daily. Surveillance labs stable in January.   Chronic systolic CHF Echo 03/2022 LVEF 69-62%, severe LAE, mild/mod RAE Volume status stable on exam. Continue lasix 20 mg daily.   Follow up with EP APP in 12 months (Needs to be seen at least q 6 months for surveillance labs, and seen q 3 by gen cards)  Signed, Graciella Freer, PA-C

## 2023-04-18 ENCOUNTER — Encounter: Payer: Self-pay | Admitting: Student

## 2023-04-18 ENCOUNTER — Ambulatory Visit: Payer: Medicare (Managed Care) | Attending: Student | Admitting: Student

## 2023-04-18 VITALS — BP 112/68 | HR 60 | Ht 71.0 in | Wt 245.4 lb

## 2023-04-18 DIAGNOSIS — I420 Dilated cardiomyopathy: Secondary | ICD-10-CM | POA: Diagnosis not present

## 2023-04-18 DIAGNOSIS — I5022 Chronic systolic (congestive) heart failure: Secondary | ICD-10-CM | POA: Diagnosis not present

## 2023-04-18 DIAGNOSIS — I493 Ventricular premature depolarization: Secondary | ICD-10-CM

## 2023-04-18 NOTE — Patient Instructions (Signed)
 Medication Instructions:  Your physician recommends that you continue on your current medications as directed. Please refer to the Current Medication list given to you today.  *If you need a refill on your cardiac medications before your next appointment, please call your pharmacy*  Lab Work: None ordered If you have labs (blood work) drawn today and your tests are completely normal, you will receive your results only by: MyChart Message (if you have MyChart) OR A paper copy in the mail If you have any lab test that is abnormal or we need to change your treatment, we will call you to review the results.  Follow-Up: At [facility], you and your health needs are our priority.  As part of our continuing mission to provide you with exceptional heart care, we have created designated Provider Care Teams.  These Care Teams include your primary Cardiologist (physician) and Advanced Practice Providers (APPs -  Physician Assistants and Nurse Practitioners) who all work together to provide you with the care you need, when you need it.  Your next appointment:   1 year(s)  Provider:   Casimiro Needle "[name], PA-C

## 2023-04-24 ENCOUNTER — Telehealth: Payer: Self-pay | Admitting: *Deleted

## 2023-04-24 NOTE — Telephone Encounter (Signed)
   Pre-operative Risk Assessment    Patient Name: [name]  DOB: [birth year] MRN: [id number]   Date of last office visit: 04/18/23 [name], [name] Date of next office visit: 05/08/23 DR, [name]   Request for Surgical Clearance    Procedure:   COCHLEAR IMPLANT DEVICE PLACEMENT  Date of Surgery:  Clearance 05/11/23                                Surgeon:  DR. Genia [name] Surgeon's Group or Practice Name:  [name] Phone number:  [phone] Fax number:  [phone]   Type of Clearance Requested:   - Medical ; NONE INDICATED ON FORM T BE HELD   Type of Anesthesia:  Not Indicated    Additional requests/questions:    [name]   04/24/2023, 4:12 PM

## 2023-04-24 NOTE — Telephone Encounter (Signed)
 [name] was recently seen by you in clinic.  Would he be acceptable risk for upcoming cochlear implant device placement?  Thank you for your help.  Please direct your response to [facility] preop pool.  Thomasene Ripple. Judaea Burgoon NP-C     04/24/2023, 4:29 PM [facility] [address] Office [phone] Fax [phone]

## 2023-04-26 ENCOUNTER — Other Ambulatory Visit: Payer: Self-pay | Admitting: Cardiology

## 2023-04-26 NOTE — Telephone Encounter (Signed)
 Prescription sent to pharmacy.

## 2023-04-27 NOTE — Telephone Encounter (Signed)
     Primary Cardiologist: Gypsy Balsam, MD  Chart reviewed as part of pre-operative protocol coverage. Given past medical history and time since last visit, based on ACC/AHA guidelines, [name] would be at acceptable risk for the planned procedure without further cardiovascular testing.   His RCRI is high risk, greater than 11% risk of major cardiac event.  He was recently seen in the cardiology clinic and is felt to be acceptable risk for planned procedure.  I will route this recommendation to the requesting party via Epic fax function and remove from pre-op pool.  Please call with questions.  Thomasene Ripple. Zacarias Krauter NP-C     04/27/2023, 7:36 AM [facility] [address] Office [phone] Fax [phone]

## 2023-05-08 ENCOUNTER — Ambulatory Visit: Payer: Medicare HMO | Admitting: Cardiology

## 2023-05-17 ENCOUNTER — Other Ambulatory Visit: Payer: Self-pay | Admitting: Internal Medicine

## 2023-06-07 LAB — HM DIABETES EYE EXAM

## 2023-06-09 ENCOUNTER — Ambulatory Visit: Payer: Self-pay | Admitting: Cardiology

## 2023-06-20 ENCOUNTER — Ambulatory Visit: Payer: Medicare (Managed Care) | Attending: Cardiology | Admitting: Cardiology

## 2023-06-20 ENCOUNTER — Encounter: Payer: Self-pay | Admitting: Cardiology

## 2023-06-20 VITALS — BP 130/64 | HR 69 | Ht 71.0 in | Wt 248.0 lb

## 2023-06-20 DIAGNOSIS — I7143 Infrarenal abdominal aortic aneurysm, without rupture: Secondary | ICD-10-CM

## 2023-06-20 DIAGNOSIS — I6521 Occlusion and stenosis of right carotid artery: Secondary | ICD-10-CM | POA: Diagnosis not present

## 2023-06-20 DIAGNOSIS — I420 Dilated cardiomyopathy: Secondary | ICD-10-CM

## 2023-06-20 DIAGNOSIS — I10 Essential (primary) hypertension: Secondary | ICD-10-CM | POA: Diagnosis not present

## 2023-06-20 NOTE — Progress Notes (Signed)
 Cardiology Office Note:    Date:  06/20/2023   ID:  [name], DOB 06/06/1934, MRN [id number]  PCP:  Roslyn Coombe, [name]  Cardiologist:  Ralene Burger, [name]    Referring [name]: Roslyn Coombe, [name]   Chief Complaint  Patient presents with   Follow-up    History of Present Illness:    [name] is a 89 y.o. male past medical history significant for cardiomyopathy ejection fraction in the neighborhood of 35%, abdominal aortic aneurysm measuring 37 mm, essential hypertension, diabetes, PVCs successfully suppressed with amiodarone .  Comes today 2 months for follow-up he did have cochlear implant still getting adjusted to it just yesterday it was turned on so he still have some difficulty hearing.  He said he have no cardiac complaints no chest pain tightness squeezing pressure burning chest he can walk climb stairs with no difficulties  Past Medical History:  Diagnosis Date   AAA (abdominal aortic aneurysm) (HCC)    MONITORED BY DR Timm Foot LOV JUNE 2014--  STABLE  AT 3.7   Abdominal aneurysm without mention of rupture 07/24/2012   Acute sinus infection 06/17/2009   Qualifier: Diagnosis of  By: [name], Alveda Aures    Allergic rhinitis    Anxiety state 07/20/2007   Arthritis    Asymptomatic stenosis of left carotid artery without infarction    MILD DISEASE   Bilateral carotid artery stenosis    BPH (benign prostatic hypertrophy)    Coronary artery calcification seen on CT scan 09/05/2017   Diabetes mellitus type 2, noninsulin dependent (HCC) 07/20/2007   Dilated cardiomyopathy (HCC) 12/14/2018   Diverticulosis of colon    Hearing loss of both ears 10/23/2013   History of cardiomyopathy    IDIOPATHIC-- PER [place] NOTE 2013 , CURRENTLY RESOLVED   History of kidney stones    HTN (hypertension) 01/14/2016   Hypercalcemia 03/15/2019   Hyperlipidemia    Hypertension    Hypertensive heart disease without CHF    Obesity (BMI 30-39.9)    Paresthesia of both feet 07/14/2016   Peripheral  vascular disease (HCC)    Personal history of kidney stones    Phimosis    PHN (postherpetic neuralgia) 09/18/2019   Preop exam for internal medicine 05/10/2016   Preventative health care 01/14/2016   Rash 05/01/2015   Right carotid artery occlusion    CHRONIC   WITHOUT INFARTION   Rotator cuff arthropathy of both shoulders 04/10/2019   Spinal stenosis at L4-L5 level 06/08/2016   Stroke (HCC)     Right eye   Type 2 diabetes mellitus (HCC)    Wears hearing aid     Past Surgical History:  Procedure Laterality Date   CARDIOVASCULAR STRESS TEST  JUNE 2008  DR Anastasia Balo   CATARACT EXTRACTION W/ INTRAOCULAR LENS IMPLANT Right    CIRCUMCISION N/A 08/30/2012   Procedure: CIRCUMCISION ADULT;  Surgeon: Willye Harvey, [name];  Location: [facility];  Service: Urology;  Laterality: N/A;   CYSTO/  RIGHT URETERAL STENT PLACEMENT  10-16-2003   EXTRACORPOREAL SHOCK WAVE LITHOTRIPSY Right 2005   EYE SURGERY     JOINT REPLACEMENT Bilateral 2008  2009   Knee   LUMBAR LAMINECTOMY/DECOMPRESSION MICRODISCECTOMY Bilateral 06/08/2016   Procedure: Bilateral Microlumbar decompression L4-L5 and L5-S1;  Surgeon: Orvan Blanch, [name];  Location: [facility];  Service: Orthopedics;  Laterality: Bilateral;  Requests 2 hours   TOTAL KNEE ARTHROPLASTY Bilateral RIGHT 10-08-2007;   LEFT 09-30-2008   TRANSTHORACIC ECHOCARDIOGRAM  10-21-2009  LVEF 50%    Current Medications: Current Meds  Medication Sig   acyclovir  (ZOVIRAX ) 400 MG tablet Take 1 tablet (400 mg total) by mouth 3 (three) times daily.   amiodarone  (PACERONE ) 200 MG tablet Take 0.5 tablets (100 mg total) by mouth daily. Please keep scheduled appointment for future refills. Thank you.   atorvastatin  (LIPITOR) 80 MG tablet Take 1 tablet (80 mg total) by mouth daily.   beta carotene w/minerals (OCUVITE) tablet Take 1 tablet by mouth daily. Unknown strength   carvedilol  (COREG ) 12.5 MG tablet Take 12.5 mg by mouth 2 (two) times daily with a meal.   clobetasol   ointment (TEMOVATE ) 0.05 % APPLY TOPICALLY TWICE DAILY (Patient taking differently: Apply 1 application  topically 2 (two) times daily.)   empagliflozin  (JARDIANCE ) 10 MG TABS tablet Take 10 mg by mouth daily.   ENTRESTO  24-26 MG TAKE ONE TABLET BY MOUTH TWICE DAILY   furosemide  (LASIX ) 40 MG tablet Take 0.5 tablets (20 mg total) by mouth daily.   glipiZIDE  (GLUCOTROL  XL) 10 MG 24 hr tablet Take 1 tablet (10 mg total) by mouth daily with breakfast.   glucose blood (FREESTYLE LITE) test strip Use as instructed once daily E11.9 (Patient taking differently: 1 each by Other route See admin instructions. Use as instructed once daily E11.9)   IBU 800 MG tablet Take 800 mg by mouth every 6 (six) hours as needed for mild pain or moderate pain.   ipratropium (ATROVENT) 0.06 % nasal spray Place 2 sprays into the nose 3 (three) times daily.   ketoconazole  (NIZORAL ) 2 % cream APPLY TOPICALLY TWICE DAILY AS NEEDED FOR IRRITATION (Patient taking differently: Apply 1 Application topically 2 (two) times daily as needed for irritation.)   levocetirizine (XYZAL) 5 MG tablet Take 5 mg by mouth every evening.   metFORMIN  (GLUCOPHAGE ) 500 MG tablet Take 1 tablet (500 mg total) by mouth 2 (two) times daily with a meal.   Multiple Vitamin (MULTIVITAMIN WITH MINERALS) TABS tablet Take 1 tablet by mouth daily. Unknown strength   SAW PALMETTO, SERENOA REPENS, PO Take 1 tablet by mouth daily.   sildenafil  (VIAGRA ) 100 MG tablet TAKE 1/2 TO 1 TABLET BY MOUTH DAILY AS NEEDED FOR ERECTILE DYSFUNCTION (Patient taking differently: Take 25-50 mg by mouth as needed for erectile dysfunction.)   tamsulosin  (FLOMAX ) 0.4 MG CAPS capsule Take 1 capsule (0.4 mg total) by mouth daily.     Allergies:   Patient has no known allergies.   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  Tobacco Use   Smoking status: Former    Current packs/day: 0.00    Average packs/day: 0.8 packs/day for 25.0 years (18.8 ttl pk-yrs)    Types: Cigarettes    Start date: 07/24/1957    Quit date: 07/25/1982    Years since quitting: 40.9   Smokeless tobacco: Never  Vaping Use   Vaping status: Never Used  Substance and Sexual Activity   Alcohol use: No    Alcohol/week: 0.0 standard drinks of alcohol    Comment: recovering alcoholic  +63yrs   Drug use: No   Sexual activity: Not on file  Other Topics  Concern   Not on file  Social History Narrative   Not on file   Social Drivers of Health   Financial Resource Strain: Low Risk  (08/06/2021)   Overall Financial Resource Strain (CARDIA)    Difficulty of Paying Living Expenses: Not hard at all  Food Insecurity: No Food Insecurity (08/06/2021)   Hunger Vital Sign    Worried About Running Out of Food in the Last Year: Never true    Ran Out of Food in the Last Year: Never true  Transportation Needs: No Transportation Needs (08/06/2021)   PRAPARE - Administrator, Civil Service (Medical): No    Lack of Transportation (Non-Medical): No  Physical Activity: Sufficiently Active (08/06/2021)   Exercise Vital Sign    Days of Exercise per Week: 5 days    Minutes of Exercise per Session: 30 min  Stress: No Stress Concern Present (08/06/2021)   [employer] of Occupational Health - Occupational Stress Questionnaire    Feeling of Stress : Not at all  Social Connections: Moderately Integrated (08/06/2021)   Social Connection and Isolation Panel [NHANES]    Frequency of Communication with Friends and Family: More than three times a week    Frequency of Social Gatherings with Friends and Family: More than three times a week    Attends Religious Services: 1 to 4 times per year    Active Member of [employer] or  Organizations: No    Attends Banker Meetings: 1 to 4 times per year    Marital Status: Widowed     Family History: The patient's family history includes ALS in his father; Asthma in his mother; COPD in his mother; Depression in his mother; Diabetes in his father; Heart disease in his mother; Hypertension in his mother; Kidney disease in his mother. There is no history of Colon cancer. ROS:   Please see the history of present illness.    All 14 point review of systems negative except as described per history of present illness  EKGs/Labs/Other Studies Reviewed:    EKG Interpretation Date/Time:  Tuesday Jun 20 2023 10:10:07 EDT Ventricular Rate:  57 PR Interval:  186 QRS Duration:  126 QT Interval:  482 QTC Calculation: 469 R Axis:   -38  Text Interpretation: Sinus bradycardia Left axis deviation Left bundle branch block When compared with ECG of 18-Apr-2023 11:23, Premature ventricular complexes are no longer Present QRS axis Shifted left Nonspecific T wave abnormality no longer evident in Inferior leads Confirmed by Ralene Burger [phone]) on 06/20/2023 10:19:05 AM    Recent Labs: 02/28/2023: ALT 17; BUN 14; Creatinine, Ser 1.01; Hemoglobin 13.8; Platelets 134.0; Potassium 4.6; Sodium 138; TSH 2.27  Recent Lipid Panel    Component Value Date/Time   CHOL 125 02/28/2023 1118   CHOL 124 03/11/2020 1108   TRIG 77.0 02/28/2023 1118   HDL 44.50 02/28/2023 1118   HDL 41 03/11/2020 1108   CHOLHDL 3 02/28/2023 1118   VLDL 15.4 02/28/2023 1118   LDLCALC 65 02/28/2023 1118   LDLCALC 67 03/11/2020 1108   LDLDIRECT 92.0 02/06/2017 1119    Physical Exam:    VS:  BP 130/64 (BP Location: Right Arm, Patient Position: Sitting)   Pulse 69   Ht 5\' 11"  (1.803 m)   Wt 248 lb (112.5 kg)   SpO2 94%   BMI 34.59 kg/m     Wt Readings from Last 3 Encounters:  06/20/23 248 lb (112.5 kg)  04/18/23 245 lb 6.4 oz (111.3 kg)  03/03/23 240 lb (108.9 kg)     GEN:  Well nourished,  well developed in no acute distress HEENT: Normal NECK: No JVD; No carotid bruits LYMPHATICS: No lymphadenopathy CARDIAC: RRR, no murmurs, no rubs, no gallops RESPIRATORY:  Clear to auscultation without rales, wheezing or rhonchi  ABDOMEN: Soft, non-tender, non-distended MUSCULOSKELETAL:  No edema; No deformity  SKIN: Warm and dry LOWER EXTREMITIES: no swelling NEUROLOGIC:  Alert and oriented x 3 PSYCHIATRIC:  Normal affect   ASSESSMENT:    1. Primary hypertension   2. Infrarenal abdominal aortic aneurysm (AAA) without rupture (HCC)   3. Right carotid artery occlusion   4. Dilated cardiomyopathy (HCC)    PLAN:    In order of problems listed above:  Infrarenal abdominal aortic aneurysm, we will schedule him to have abdominal ultrasounds to check the size of the aneurysm. History of dilated cardiomyopathy look compensated from the physical exam, we have difficulty putting him on guideline directed medical therapy because of difficulty tolerating medications.  Will recheck echocardiogram to look for left ventricular ejection fraction. Carotic arterial occlusion on the right known.  Last carotic ultrasounds reviewed no new additional findings continue monitoring. Dyslipidemia I did review his K PN which show me data from February 28, 2023 LDL 65 HDL 44 continue present management. PVCs denies having any palpitations   Medication Adjustments/Labs and Tests Ordered: Current medicines are reviewed at length with the patient today.  Concerns regarding medicines are outlined above.  Orders Placed This Encounter  Procedures   EKG 12-Lead   Medication changes: No orders of the defined types were placed in this encounter.   Signed, Manfred Seed, [name], [facility] 06/20/2023 10:25 AM    [facility]

## 2023-06-20 NOTE — Patient Instructions (Signed)
 Medication Instructions:  Your physician recommends that you continue on your current medications as directed. Please refer to the Current Medication list given to you today.  *If you need a refill on your cardiac medications before your next appointment, please call your pharmacy*  Lab Work: None If you have labs (blood work) drawn today and your tests are completely normal, you will receive your results only by: MyChart Message (if you have MyChart) OR A paper copy in the mail If you have any lab test that is abnormal or we need to change your treatment, we will call you to review the results.  Testing/Procedures: Your physician has requested that you have an abdominal aorta duplex. During this test, an ultrasound is used to evaluate the aorta. Allow 30 minutes for this exam. Do not eat after midnight the day before and avoid carbonated beverages.  Please note: We ask at that you not bring children with you during ultrasound (echo/ vascular) testing. Due to room size and safety concerns, children are not allowed in the ultrasound rooms during exams. Our front office staff cannot provide observation of children in our lobby area while testing is being conducted. An adult accompanying a patient to their appointment will only be allowed in the ultrasound room at the discretion of the ultrasound technician under special circumstances. We apologize for any inconvenience.  Your physician has requested that you have an echocardiogram. Echocardiography is a painless test that uses sound waves to create images of your heart. It provides your doctor with information about the size and shape of your heart and how well your heart's chambers and valves are working. This procedure takes approximately one hour. There are no restrictions for this procedure. Please do NOT wear cologne, perfume, aftershave, or lotions (deodorant is allowed). Please arrive 15 minutes prior to your appointment time.  Please note:  We ask at that you not bring children with you during ultrasound (echo/ vascular) testing. Due to room size and safety concerns, children are not allowed in the ultrasound rooms during exams. Our front office staff cannot provide observation of children in our lobby area while testing is being conducted. An adult accompanying a patient to their appointment will only be allowed in the ultrasound room at the discretion of the ultrasound technician under special circumstances. We apologize for any inconvenience.   Follow-Up: At [facility], you and your health needs are our priority.  As part of our continuing mission to provide you with exceptional heart care, our providers are all part of one team.  This team includes your primary Cardiologist (physician) and Advanced Practice Providers or APPs (Physician Assistants and Nurse Practitioners) who all work together to provide you with the care you need, when you need it.  Your next appointment:   6 month(s)  Provider:   Ralene Burger, MD    We recommend signing up for the patient portal called "MyChart".  Sign up information is provided on this After Visit Summary.  MyChart is used to connect with patients for Virtual Visits (Telemedicine).  Patients are able to view lab/test results, encounter notes, upcoming appointments, etc.  Non-urgent messages can be sent to your provider as well.   To learn more about what you can do with MyChart, go to [email or web address].   Other Instructions None

## 2023-06-20 NOTE — Addendum Note (Signed)
 Addended by: Aurelio Leer I on: 06/20/2023 10:39 AM   Modules accepted: Orders

## 2023-07-06 ENCOUNTER — Ambulatory Visit: Payer: Medicare (Managed Care)

## 2023-07-06 VITALS — Ht 71.0 in | Wt 248.0 lb

## 2023-07-06 DIAGNOSIS — Z0000 Encounter for general adult medical examination without abnormal findings: Secondary | ICD-10-CM | POA: Diagnosis not present

## 2023-07-06 NOTE — Progress Notes (Signed)
 Subjective:   [name] is a 89 y.o. who presents for a Medicare Wellness preventive visit.  As a reminder, Annual Wellness Visits don't include a physical exam, and some assessments may be limited, especially if this visit is performed virtually. We may recommend an in-person follow-up visit with your provider if needed.  Visit Complete: Virtual I connected with  [name] on 07/06/23 by a audio enabled telemedicine application and verified that I am speaking with the correct person using two identifiers.  Patient Location: Home  Provider Location: Office/Clinic  I discussed the limitations of evaluation and management by telemedicine. The patient expressed understanding and agreed to proceed.  Vital Signs: Because this visit was a virtual/telehealth visit, some criteria may be missing or patient reported. Any vitals not documented were not able to be obtained and vitals that have been documented are patient reported.  VideoDeclined- This patient declined Librarian, academic. Therefore the visit was completed with audio only.  Persons Participating in Visit: Patient.  AWV Questionnaire: No: Patient Medicare AWV questionnaire was not completed prior to this visit.  Cardiac Risk Factors include: advanced age (>53men, >82 women);male gender;diabetes mellitus;obesity (BMI >30kg/m2);dyslipidemia;Other (see comment);hypertension     Objective:     Today's Vitals   07/06/23 1358  Weight: 248 lb (112.5 kg)  Height: 5\' 11"  (1.803 m)   Body mass index is 34.59 kg/m.     07/06/2023    2:14 PM 07/05/2022    2:09 PM 08/06/2021   11:06 AM 05/27/2020   10:04 AM 08/31/2017   10:03 AM 08/17/2016    9:05 AM 06/08/2016    7:12 PM  Advanced Directives  Does Patient Have a Medical Advance Directive? Yes Yes Yes Yes Yes Yes Yes  Type of Estate agent of [place];Living will Healthcare Power of [place];Living will Healthcare Power of  [place];Living will Living will;Healthcare Power of [employer] Power of [place];Living will Healthcare Power of [place];Living will Healthcare Power of Attorney  Does patient want to make changes to medical advance directive?   No - Patient declined No - Patient declined   No - Patient declined  Copy of Healthcare Power of Attorney in Chart? No - copy requested No - copy requested No - copy requested No - copy requested No - copy requested No - copy requested No - copy requested  Would patient like information on creating a medical advance directive?       No - Patient declined    Current Medications (verified) Outpatient Encounter Medications as of 07/06/2023  Medication Sig   acyclovir  (ZOVIRAX ) 400 MG tablet Take 1 tablet (400 mg total) by mouth 3 (three) times daily.   amiodarone  (PACERONE ) 200 MG tablet Take 0.5 tablets (100 mg total) by mouth daily. Please keep scheduled appointment for future refills. Thank you.   atorvastatin  (LIPITOR) 80 MG tablet Take 1 tablet (80 mg total) by mouth daily.   beta carotene w/minerals (OCUVITE) tablet Take 1 tablet by mouth daily. Unknown strength   carvedilol  (COREG ) 12.5 MG tablet Take 12.5 mg by mouth 2 (two) times daily with a meal.   clobetasol  ointment (TEMOVATE ) 0.05 % APPLY TOPICALLY TWICE DAILY (Patient taking differently: Apply 1 application  topically 2 (two) times daily.)   empagliflozin  (JARDIANCE ) 10 MG TABS tablet Take 10 mg by mouth daily.   ENTRESTO  24-26 MG TAKE ONE TABLET BY MOUTH TWICE DAILY   furosemide  (LASIX ) 40 MG tablet Take 0.5 tablets (20 mg total) by  mouth daily.   glipiZIDE  (GLUCOTROL  XL) 10 MG 24 hr tablet Take 1 tablet (10 mg total) by mouth daily with breakfast.   glucose blood (FREESTYLE LITE) test strip Use as instructed once daily E11.9 (Patient taking differently: 1 each by Other route See admin instructions. Use as instructed once daily E11.9)   hydrochlorothiazide  (HYDRODIURIL ) 25 MG tablet Take 25 mg by  mouth daily.   IBU 800 MG tablet Take 800 mg by mouth every 6 (six) hours as needed for mild pain or moderate pain.   ipratropium (ATROVENT) 0.06 % nasal spray Place 2 sprays into the nose 3 (three) times daily.   ketoconazole  (NIZORAL ) 2 % cream APPLY TOPICALLY TWICE DAILY AS NEEDED FOR IRRITATION (Patient taking differently: Apply 1 Application topically 2 (two) times daily as needed for irritation.)   levocetirizine (XYZAL) 5 MG tablet Take 5 mg by mouth every evening.   metFORMIN  (GLUCOPHAGE ) 500 MG tablet Take 1 tablet (500 mg total) by mouth 2 (two) times daily with a meal.   Multiple Vitamin (MULTIVITAMIN WITH MINERALS) TABS tablet Take 1 tablet by mouth daily. Unknown strength   SAW PALMETTO, SERENOA REPENS, PO Take 1 tablet by mouth daily.   sildenafil  (VIAGRA ) 100 MG tablet TAKE 1/2 TO 1 TABLET BY MOUTH DAILY AS NEEDED FOR ERECTILE DYSFUNCTION (Patient taking differently: Take 25-50 mg by mouth as needed for erectile dysfunction.)   tamsulosin  (FLOMAX ) 0.4 MG CAPS capsule Take 1 capsule (0.4 mg total) by mouth daily.   naproxen (NAPROSYN) 250 MG tablet Take 250 mg by mouth.   No facility-administered encounter medications on file as of 07/06/2023.    Allergies (verified) Patient has no known allergies.   History: Past Medical History:  Diagnosis Date   AAA (abdominal aortic aneurysm) (HCC)    MONITORED BY DR LAWSON LOV JUNE 2014--  STABLE  AT 3.7   Abdominal aneurysm without mention of rupture 07/24/2012   Acute sinus infection 06/17/2009   Qualifier: Diagnosis of  By: Autry Legions MD, Alveda Aures    Allergic rhinitis    Anxiety state 07/20/2007   Arthritis    Asymptomatic stenosis of left carotid artery without infarction    MILD DISEASE   Bilateral carotid artery stenosis    BPH (benign prostatic hypertrophy)    Coronary artery calcification seen on CT scan 09/05/2017   Diabetes mellitus type 2, noninsulin dependent (HCC) 07/20/2007   Dilated cardiomyopathy (HCC) 12/14/2018    Diverticulosis of colon    Hearing loss of both ears 10/23/2013   History of cardiomyopathy    IDIOPATHIC-- PER TILLEY NOTE 2013 , CURRENTLY RESOLVED   History of kidney stones    HTN (hypertension) 01/14/2016   Hypercalcemia 03/15/2019   Hyperlipidemia    Hypertension    Hypertensive heart disease without CHF    Obesity (BMI 30-39.9)    Paresthesia of both feet 07/14/2016   Peripheral vascular disease (HCC)    Personal history of kidney stones    Phimosis    PHN (postherpetic neuralgia) 09/18/2019   Preop exam for internal medicine 05/10/2016   Preventative health care 01/14/2016   Rash 05/01/2015   Right carotid artery occlusion    CHRONIC   WITHOUT INFARTION   Rotator cuff arthropathy of both shoulders 04/10/2019   Spinal stenosis at L4-L5 level 06/08/2016   Stroke [facility])     Right eye   Type 2 diabetes mellitus (HCC)    Wears hearing aid    Past Surgical History:  Procedure Laterality Date  CARDIOVASCULAR STRESS TEST  JUNE 2008  DR TILLEY   CATARACT EXTRACTION W/ INTRAOCULAR LENS IMPLANT Right    CIRCUMCISION N/A 08/30/2012   Procedure: CIRCUMCISION ADULT;  Surgeon: Willye Harvey, MD;  Location: [facility];  Service: Urology;  Laterality: N/A;   CYSTO/  RIGHT URETERAL STENT PLACEMENT  10-16-2003   EXTRACORPOREAL SHOCK WAVE LITHOTRIPSY Right 2005   EYE SURGERY     JOINT REPLACEMENT Bilateral 2008  2009   Knee   LUMBAR LAMINECTOMY/DECOMPRESSION MICRODISCECTOMY Bilateral 06/08/2016   Procedure: Bilateral Microlumbar decompression L4-L5 and L5-S1;  Surgeon: Orvan Blanch, MD;  Location: [facility];  Service: Orthopedics;  Laterality: Bilateral;  Requests 2 hours   TOTAL KNEE ARTHROPLASTY Bilateral RIGHT 10-08-2007;   LEFT 09-30-2008   TRANSTHORACIC ECHOCARDIOGRAM  10-21-2009   LVEF 50%   Family History  Problem Relation Age of Onset   Heart disease Mother        CHF  Before age 59   COPD Mother    Kidney disease Mother    Hypertension Mother    Asthma Mother     Depression Mother    ALS Father    Diabetes Father    Colon cancer Neg Hx    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  Tobacco Use   Smoking status: Former    Current packs/day: 0.00    Average packs/day: 0.8 packs/day for 25.0 years (18.8 ttl pk-yrs)    Types: Cigarettes    Start date: 07/24/1957    Quit date: 07/25/1982    Years since quitting: 40.9   Smokeless tobacco: Never  Vaping Use   Vaping status: Never Used  Substance and Sexual Activity   Alcohol use: No    Alcohol/week: 0.0 standard drinks of alcohol    Comment: recovering alcoholic  +11yrs   Drug use: No   Sexual activity: Not on file  Other Topics Concern   Not on file  Social History Narrative   Lives alone/2025   Social Drivers of Health   Financial Resource Strain: Low Risk  (08/06/2021)   Overall Financial Resource Strain (CARDIA)    Difficulty of Paying Living Expenses: Not hard at all  Food Insecurity: No Food Insecurity (08/06/2021)   Hunger Vital Sign    Worried About Running Out of Food in the Last Year: Never true    Ran Out of Food in the Last Year: Never true  Transportation Needs: No Transportation Needs (07/06/2023)   PRAPARE - Administrator, Civil Service (Medical): No    Lack of Transportation (Non-Medical): No  Physical Activity: Inactive (07/06/2023)   Exercise Vital Sign    Days of Exercise per Week: 0 days    Minutes of Exercise per Session: 0 min  Stress: No Stress Concern Present (07/06/2023)   [employer] of Occupational Health - Occupational Stress Questionnaire    Feeling of Stress : Not at all  Social Connections: Socially Isolated (07/06/2023)   Social Connection and Isolation  Panel [NHANES]    Frequency of Communication with Friends and Family: Twice a week    Frequency of Social Gatherings with Friends and Family: Once  a week    Attends Religious Services: Never    Active Member of Clubs or Organizations: No    Attends Banker Meetings: Never    Marital Status: Widowed    Tobacco Counseling Counseling given: Not Answered    Clinical Intake:  Pre-visit preparation completed: Yes  Pain : No/denies pain     BMI - recorded: 34.59 Nutritional Status: BMI > 30  Obese Diabetes: Yes CBG done?: No Did pt. bring in CBG monitor from home?: No  Lab Results  Component Value Date   HGBA1C 6.5 02/28/2023   HGBA1C 5.9 08/24/2022   HGBA1C 5.8 02/22/2022     How often do you need to have someone help you when you read instructions, pamphlets, or other written materials from your doctor or pharmacy?: 1 - Never  Interpreter Needed?: No  Information entered by :: Josel Keo, RMA   Activities of Daily Living     07/06/2023    2:09 PM  In your present state of health, do you have any difficulty performing the following activities:  Hearing? 1  Comment has hearing implants  Vision? 0  Difficulty concentrating or making decisions? 0  Walking or climbing stairs? 0  Dressing or bathing? 0  Doing errands, shopping? 0  Preparing Food and eating ? N  Using the Toilet? N  In the past six months, have you accidently leaked urine? N  Do you have problems with loss of bowel control? N  Managing your Medications? N  Managing your Finances? N  Housekeeping or managing your Housekeeping? N    Patient Care Team: Roslyn Coombe, MD as PCP - General Carolynne Citron Stevens Eland, MD as PCP - Electrophysiology (Cardiology) Manfred Seed, MD as PCP - Cardiology (Cardiology) Dorsey Gault, MD as Consulting Physician (Cardiology) [facility] as Consulting Physician (Optometry)  Indicate any recent Medical Services you may have received from  other than [facility] providers in the past year (date may be approximate).     Assessment:    This is a routine wellness examination for [place].  Hearing/Vision screen Hearing Screening - Comments:: Wears hearing aides/implants Vision Screening - Comments:: Wears eyeglasses/Dr. Brooks Cao   Goals Addressed               This Visit's Progress     Patient Stated (pt-stated)        Would like to lose about 10 lbs and exercise/2025       Depression Screen     07/06/2023    2:16 PM 03/03/2023    2:49 PM 08/30/2022    2:55 PM 07/05/2022    2:11 PM 03/01/2022    4:00 PM 03/01/2022    3:36 PM 10/27/2021    1:05 PM  PHQ 2/9 Scores  PHQ - 2 Score 0 0 0 0 0 0 0  PHQ- 9 Score 0  0 0  0 1    Fall Risk     07/06/2023    2:14 PM 03/03/2023    2:49 PM 08/30/2022    2:55 PM 07/05/2022    2:11 PM 03/01/2022    4:00 PM  Fall Risk   Falls in the past year? 0 0 0 0 0  Number falls in past yr: 0 0 0 0 0  Injury with Fall? 0 0 0 0 0  Risk for fall due to : Medication side effect No Fall Risks No Fall Risks No Fall Risks   Follow up Falls evaluation completed;Falls prevention  discussed Falls evaluation completed Falls evaluation completed Falls prevention discussed     MEDICARE RISK AT HOME:  Medicare Risk at Home Any stairs in or around the home?: No If so, are there any without handrails?: No Home free of loose throw rugs in walkways, pet beds, electrical cords, etc?: Yes Adequate lighting in your home to reduce risk of falls?: Yes Life alert?: No Use of a cane, walker or w/c?: No Grab bars in the bathroom?: Yes Shower chair or bench in shower?: Yes Elevated toilet seat or a handicapped toilet?: Yes  TIMED UP AND GO:  Was the test performed?  No  Cognitive Function: Declined/Normal: No cognitive concerns noted by patient or family. Patient alert, oriented, able to answer questions appropriately and recall recent events. No signs of memory loss or confusion.        07/05/2022    2:18 PM  08/06/2021   11:26 AM  6CIT Screen  What Year? 0 points 0 points  What month? 0 points 0 points  What time? 0 points 0 points  Count back from 20 0 points 0 points  Months in reverse 0 points 0 points  Repeat phrase 0 points 0 points  Total Score 0 points 0 points    Immunizations Immunization History  Administered Date(s) Administered   Fluad Quad(high Dose 65+) 12/14/2018, 10/27/2021, 11/13/2022   Influenza Whole 12/08/2008   Influenza, High Dose Seasonal PF 11/02/2017, 11/15/2019   Influenza,inj,Quad PF,6+ Mos 10/23/2013, 10/24/2014, 12/12/2016   Influenza-Unspecified 01/19/2021   Moderna Covid-19 Vaccine Bivalent Booster 38yrs & up 11/02/2020   PFIZER(Purple Top)SARS-COV-2 Vaccination 04/08/2019, 05/09/2019, 12/11/2019, 11/02/2020   Pneumococcal Conjugate-13 04/19/2013   Pneumococcal Polysaccharide-23 08/22/2008   Td 02/07/1993, 08/22/2008   Tdap 09/12/2018   Zoster Recombinant(Shingrix) 05/24/2017, 09/26/2017    Screening Tests Health Maintenance  Topic Date Due   COVID-19 Vaccine (5 - 2024-25 season) 10/09/2022   Medicare Annual Wellness (AWV)  07/05/2023   OPHTHALMOLOGY EXAM  06/27/2023   INFLUENZA VACCINE  09/08/2023   FOOT EXAM  03/02/2024   DTaP/Tdap/Td (4 - Td or Tdap) 09/11/2028   Pneumonia Vaccine 7+ Years old  Completed   Zoster Vaccines- Shingrix  Completed   HPV VACCINES  Aged Out   Meningococcal B Vaccine  Aged Out    Health Maintenance  Health Maintenance Due  Topic Date Due   COVID-19 Vaccine (5 - 2024-25 season) 10/09/2022   Medicare Annual Wellness (AWV)  07/05/2023   OPHTHALMOLOGY EXAM  06/27/2023   Health Maintenance Items Addressed: See Nurse Notes  Additional Screening:  Vision Screening: Recommended annual ophthalmology exams for early detection of glaucoma and other disorders of the eye.  Dental Screening: Recommended annual dental exams for proper oral hygiene  Community Resource Referral / Chronic Care Management: CRR required  this visit?  No   CCM required this visit?  No   Plan:    I have personally reviewed and noted the following in the patient's chart:   Medical and social history Use of alcohol, tobacco or illicit drugs  Current medications and supplements including opioid prescriptions. Patient is not currently taking opioid prescriptions. Functional ability and status Nutritional status Physical activity Advanced directives List of other physicians Hospitalizations, surgeries, and ER visits in previous 12 months Vitals Screenings to include cognitive, depression, and falls Referrals and appointments  In addition, I have reviewed and discussed with patient certain preventive protocols, quality metrics, and best practice recommendations. A written personalized care plan for preventive services as well as general  preventive health recommendations were provided to patient.   Carden Teel L Amisha Pospisil, CMA   07/06/2023   After Visit Summary: (MyChart) Due to this being a telephonic visit, the after visit summary with patients personalized plan was offered to patient via MyChart   Notes: Please refer to Routing Comments.

## 2023-07-06 NOTE — Patient Instructions (Signed)
 [name] , Thank you for taking time out of your busy schedule to complete your Annual Wellness Visit with me. I enjoyed our conversation and look forward to speaking with you again next year. I, as well as your care team,  appreciate your ongoing commitment to your health goals. Please review the following plan we discussed and let me know if I can assist you in the future. Your Game plan/ To Do List    Follow up Visits: Next Medicare AWV with our clinical staff: 07/08/2024.   Have you seen your provider in the last 6 months (3 months if uncontrolled diabetes)? Yes Next Office Visit with your provider: 09/01/2023.  Clinician Recommendations:  Aim for 30 minutes of exercise or brisk walking, 6-8 glasses of water, and 5 servings of fruits and vegetables each day.       This is a list of the screening recommended for you and due dates:  Health Maintenance  Topic Date Due   COVID-19 Vaccine (5 - 2024-25 season) 10/09/2022   Medicare Annual Wellness Visit  07/05/2023   Eye exam for diabetics  06/27/2023   Flu Shot  09/08/2023   Complete foot exam   03/02/2024   DTaP/Tdap/Td vaccine (4 - Td or Tdap) 09/11/2028   Pneumonia Vaccine  Completed   Zoster (Shingles) Vaccine  Completed   HPV Vaccine  Aged Out   Meningitis B Vaccine  Aged Out    Advanced directives: (Copy Requested) Please bring a copy of your health care power of attorney and living will to the office to be added to your chart at your convenience. You can mail to [facility] [address] [place], [place] [address] or email to [email or web address] Advance Care Planning is important because it:  [x]  Makes sure you receive the medical care that is consistent with your values, goals, and preferences  [x]  It provides guidance to your family and loved ones and reduces their decisional burden about whether or not they are making the right decisions based on your wishes.  Follow the link provided in your  after visit summary or read over the paperwork we have mailed to you to help you started getting your Advance Directives in place. If you need assistance in completing these, please reach out to us  so that we can help you!  See attachments for Preventive Care and Fall Prevention Tips.

## 2023-07-17 ENCOUNTER — Other Ambulatory Visit: Payer: Self-pay | Admitting: Cardiology

## 2023-07-17 DIAGNOSIS — I6521 Occlusion and stenosis of right carotid artery: Secondary | ICD-10-CM

## 2023-07-17 DIAGNOSIS — I10 Essential (primary) hypertension: Secondary | ICD-10-CM

## 2023-07-17 DIAGNOSIS — I420 Dilated cardiomyopathy: Secondary | ICD-10-CM

## 2023-07-17 DIAGNOSIS — I7143 Infrarenal abdominal aortic aneurysm, without rupture: Secondary | ICD-10-CM

## 2023-07-27 ENCOUNTER — Ambulatory Visit (HOSPITAL_BASED_OUTPATIENT_CLINIC_OR_DEPARTMENT_OTHER)
Admission: RE | Admit: 2023-07-27 | Discharge: 2023-07-27 | Disposition: A | Discharge status: Home / Self Care | Payer: Medicare (Managed Care) | Source: Ambulatory Visit | Attending: Cardiology | Admitting: Cardiology

## 2023-07-27 DIAGNOSIS — I420 Dilated cardiomyopathy: Secondary | ICD-10-CM | POA: Diagnosis present

## 2023-07-27 DIAGNOSIS — I7143 Infrarenal abdominal aortic aneurysm, without rupture: Secondary | ICD-10-CM | POA: Diagnosis not present

## 2023-07-27 LAB — ECHOCARDIOGRAM COMPLETE
AR max vel: 1.68 cm2
AV Area VTI: 1.68 cm2
AV Area mean vel: 1.86 cm2
AV Mean grad: 4 mmHg
AV Peak grad: 8.2 mmHg
Ao pk vel: 1.43 m/s
Area-P 1/2: 4.06 cm2
Calc EF: 30.3 %
S' Lateral: 5.35 cm
Single Plane A2C EF: 30.8 %
Single Plane A4C EF: 30.1 %

## 2023-07-28 ENCOUNTER — Other Ambulatory Visit: Payer: Self-pay | Admitting: Cardiology

## 2023-07-30 ENCOUNTER — Ambulatory Visit: Payer: Self-pay | Admitting: Cardiology

## 2023-07-31 ENCOUNTER — Telehealth: Payer: Self-pay | Admitting: Cardiology

## 2023-07-31 MED ORDER — FUROSEMIDE 40 MG PO TABS: 20.0000 mg | ORAL_TABLET | Freq: Every day | ORAL | 2 refills | Status: AC

## 2023-07-31 NOTE — Telephone Encounter (Signed)
 Rx sent to requested Pharmacy.

## 2023-07-31 NOTE — Telephone Encounter (Signed)
*  STAT* If patient is at the pharmacy, call can be transferred to refill team.   1. Which medications need to be refilled? (please list name of each medication and dose if known) furosemide  (LASIX ) 40 MG tablet  Take 0.5 tablets (20 mg total) by mouth daily.   2. Would you like to learn more about the convenience, safety, & potential cost savings by using the [facility]? No   3. Are you open to using the [facility] No  4. Which pharmacy/location (including street and city if local pharmacy) is medication to be sent to? [facility] - [place], [place] - [address]     5. Do they need a 30 day or 90 day supply? 90 Day Supply  Pt is completely out of medication.

## 2023-08-03 NOTE — Progress Notes (Signed)
 [name] [address] [place] [address] Phone: [phone] Subjective:   [name] am a scribe for Dr. Claudene.   I'm seeing this patient by the request  of:  Norleen Lynwood ORN, MD  CC: Low back pain  YEP:Dlagzrupcz  03/06/2023 Patient does have some loss lordosis.  Patient responded extremely well to the epidural.  Increase activity slowly otherwise.  Follow-up with me again in 6 to 8 weeks.  Follow-up again if you need anything else at this time.  Can repeat epidurals every 3 months if necessary.  Will continue to help patient stay as active as possible.     Will attempt acyclovir .  Warned of potential side effects.  May need to see if he does make improvement would consider prophylactic treatment otherwise.    Update 08/07/2023 [name] is a 89 y.o. male coming in with complaint of lumbar radiculopathy.  Had responded fairly well to conservative therapy and last epidural of the back was in October 2024.  Patient states that his back is hurting today. Been like this for about three weeks now. Good day and bad days.      Past Medical History:  Diagnosis Date   AAA (abdominal aortic aneurysm) (HCC)    MONITORED BY DR LAWSON LOV JUNE 2014--  STABLE  AT 3.7   Abdominal aneurysm without mention of rupture 07/24/2012   Acute sinus infection 06/17/2009   Qualifier: Diagnosis of  By: Norleen MD, Lynwood ORN    Allergic rhinitis    Anxiety state 07/20/2007   Arthritis    Asymptomatic stenosis of left carotid artery without infarction    MILD DISEASE   Bilateral carotid artery stenosis    BPH (benign prostatic hypertrophy)    Coronary artery calcification seen on CT scan 09/05/2017   Diabetes mellitus type 2, noninsulin dependent (HCC) 07/20/2007   Dilated cardiomyopathy (HCC) 12/14/2018   Diverticulosis of colon    Hearing loss of both ears 10/23/2013   History of cardiomyopathy    IDIOPATHIC-- PER [facility] NOTE 2013 , CURRENTLY RESOLVED    History of kidney stones    HTN (hypertension) 01/14/2016   Hypercalcemia 03/15/2019   Hyperlipidemia    Hypertension    Hypertensive heart disease without CHF    Obesity (BMI 30-39.9)    Paresthesia of both feet 07/14/2016   Peripheral vascular disease (HCC)    Personal history of kidney stones    Phimosis    PHN (postherpetic neuralgia) 09/18/2019   Preop exam for internal medicine 05/10/2016   Preventative health care 01/14/2016   Rash 05/01/2015   Right carotid artery occlusion    CHRONIC   WITHOUT INFARTION   Rotator cuff arthropathy of both shoulders 04/10/2019   Spinal stenosis at L4-L5 level 06/08/2016   Stroke (HCC)     Right eye   Type 2 diabetes mellitus (HCC)    Wears hearing aid    Past Surgical History:  Procedure Laterality Date   CARDIOVASCULAR STRESS TEST  JUNE 2008  DR BLANCA   CATARACT EXTRACTION W/ INTRAOCULAR LENS IMPLANT Right    CIRCUMCISION N/A 08/30/2012   Procedure: CIRCUMCISION ADULT;  Surgeon: Norleen JINNY Seltzer, MD;  Location: [facility];  Service: Urology;  Laterality: N/A;   CYSTO/  RIGHT URETERAL STENT PLACEMENT  10-16-2003   EXTRACORPOREAL SHOCK WAVE LITHOTRIPSY Right 2005   EYE SURGERY     JOINT REPLACEMENT Bilateral 2008  2009   Knee   LUMBAR LAMINECTOMY/DECOMPRESSION  MICRODISCECTOMY Bilateral 06/08/2016   Procedure: Bilateral Microlumbar decompression L4-L5 and L5-S1;  Surgeon: Reyes Billing, MD;  Location: [facility];  Service: Orthopedics;  Laterality: Bilateral;  Requests 2 hours   TOTAL KNEE ARTHROPLASTY Bilateral RIGHT 10-08-2007;   LEFT 09-30-2008   TRANSTHORACIC ECHOCARDIOGRAM  10-21-2009   LVEF 50%   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  Tobacco Use    Smoking status: Former    Current packs/day: 0.00    Average packs/day: 0.8 packs/day for 25.0 years (18.8 ttl pk-yrs)    Types: Cigarettes    Start date: 07/24/1957    Quit date: 07/25/1982    Years since quitting: 41.0   Smokeless tobacco: Never  Vaping Use   Vaping status: Never Used  Substance and Sexual Activity   Alcohol use: No    Alcohol/week: 0.0 standard drinks of alcohol    Comment: recovering alcoholic  +95yrs   Drug use: No   Sexual activity: Not on file  Other Topics Concern   Not on file  Social History Narrative   Lives alone/2025   Social Drivers of Health   Financial Resource Strain: Low Risk  (07/06/2023)   Overall Financial Resource Strain (CARDIA)    Difficulty of Paying Living Expenses: Not hard at all  Food Insecurity: No Food Insecurity (07/06/2023)   Hunger Vital Sign    Worried About Running Out of Food in the Last Year: Never true    Ran Out of Food in the Last Year: Never true  Transportation Needs: No Transportation Needs (07/06/2023)   PRAPARE - Administrator, Civil Service (Medical): No    Lack of Transportation (Non-Medical): No  Physical Activity: Inactive (07/06/2023)   Exercise Vital Sign    Days of Exercise per Week: 0 days    Minutes of Exercise per Session: 0 min  Stress: No Stress Concern Present (07/06/2023)   [employer] of Occupational Health - Occupational Stress Questionnaire    Feeling of Stress : Not at all  Social Connections: Socially Isolated (07/06/2023)   Social Connection and Isolation Panel    Frequency of Communication with Friends and Family: Twice a week    Frequency of Social Gatherings with Friends and Family: Once a week    Attends Religious Services: Never    Database administrator or Organizations: No    Attends Banker Meetings: Never    Marital Status: Widowed   No Known Allergies Family History  Problem Relation Age of Onset   Heart disease Mother        CHF  Before age 40    COPD Mother    Kidney disease Mother    Hypertension Mother    Asthma Mother    Depression Mother    ALS Father    Diabetes Father    Colon cancer Neg Hx     Current Outpatient Medications (Endocrine & Metabolic):    empagliflozin  (JARDIANCE ) 10 MG TABS tablet, Take 10 mg by mouth daily.   glipiZIDE  (GLUCOTROL  XL) 10 MG 24 hr tablet, Take 1 tablet (10 mg total) by mouth daily with breakfast.   metFORMIN  (GLUCOPHAGE ) 500  MG tablet, Take 1 tablet (500 mg total) by mouth 2 (two) times daily with a meal.  Current Outpatient Medications (Cardiovascular):    amiodarone  (PACERONE ) 200 MG tablet, Take 0.5 tablets (100 mg total) by mouth daily. Please keep scheduled appointment for future refills. Thank you.   atorvastatin  (LIPITOR) 80 MG tablet, Take 1 tablet (80 mg total) by mouth daily.   carvedilol  (COREG ) 12.5 MG tablet, Take 12.5 mg by mouth 2 (two) times daily with a meal.   ENTRESTO  24-26 MG, TAKE ONE TABLET BY MOUTH TWICE DAILY   furosemide  (LASIX ) 40 MG tablet, Take 0.5 tablets (20 mg total) by mouth daily.   hydrochlorothiazide  (HYDRODIURIL ) 25 MG tablet, Take 25 mg by mouth daily.   sildenafil  (VIAGRA ) 100 MG tablet, TAKE 1/2 TO 1 TABLET BY MOUTH DAILY AS NEEDED FOR ERECTILE DYSFUNCTION (Patient taking differently: Take 25-50 mg by mouth as needed for erectile dysfunction.)  Current Outpatient Medications (Respiratory):    ipratropium (ATROVENT) 0.06 % nasal spray, Place 2 sprays into the nose 3 (three) times daily.   levocetirizine (XYZAL) 5 MG tablet, Take 5 mg by mouth every evening.  Current Outpatient Medications (Analgesics):    IBU 800 MG tablet, Take 800 mg by mouth every 6 (six) hours as needed for mild pain or moderate pain.   naproxen (NAPROSYN) 250 MG tablet, Take 250 mg by mouth.   Current Outpatient Medications (Other):    acyclovir  (ZOVIRAX ) 400 MG tablet, Take 1 tablet (400 mg total) by mouth 3 (three) times daily.   beta carotene w/minerals (OCUVITE) tablet,  Take 1 tablet by mouth daily. Unknown strength   clobetasol  ointment (TEMOVATE ) 0.05 %, APPLY TOPICALLY TWICE DAILY (Patient taking differently: Apply 1 application  topically 2 (two) times daily.)   glucose blood (FREESTYLE LITE) test strip, Use as instructed once daily E11.9 (Patient taking differently: 1 each by Other route See admin instructions. Use as instructed once daily E11.9)   ketoconazole  (NIZORAL ) 2 % cream, APPLY TOPICALLY TWICE DAILY AS NEEDED FOR IRRITATION (Patient taking differently: Apply 1 Application topically 2 (two) times daily as needed for irritation.)   Multiple Vitamin (MULTIVITAMIN WITH MINERALS) TABS tablet, Take 1 tablet by mouth daily. Unknown strength   SAW PALMETTO, SERENOA REPENS, PO, Take 1 tablet by mouth daily.   tamsulosin  (FLOMAX ) 0.4 MG CAPS capsule, Take 1 capsule (0.4 mg total) by mouth daily.   Reviewed prior external information including notes and imaging from  primary care provider As well as notes that were available from care everywhere and other healthcare systems.  Past medical history, social, surgical and family history all reviewed in electronic medical record.  No pertanent information unless stated regarding to the chief complaint.   Review of Systems:  No headache, visual changes, nausea, vomiting, diarrhea, constipation, dizziness, abdominal pain, skin rash, fevers, chills, night sweats, weight loss, swollen lymph nodes, body aches, joint swelling, chest pain, shortness of breath, mood changes. POSITIVE muscle aches  Objective  Blood pressure 110/60, pulse (!) 57, height 5' 11 (1.803 m), weight 247 lb 9.6 oz (112.3 kg), SpO2 98%.   General: No apparent distress alert and oriented x3 mood and affect normal, dressed appropriately.  HEENT: Pupils equal, extraocular movements intact  Respiratory: Patient's speak in full sentences and does not appear short of breath  Cardiovascular: No lower extremity edema, non tender, no erythema  Low  back exam shows severe loss of lordosis.  Poor core strength noted.  Poor strength with hip abductors.  Mild atrophy  of the gluteal musculature bilaterally.  Worsening pain with extension of the back.  Tightness with straight leg test but no radicular symptoms    Impression and Recommendations:     The above documentation has been reviewed and is accurate and complete Nayeliz Hipp M Chanee Henrickson, DO

## 2023-08-07 ENCOUNTER — Encounter: Payer: Self-pay | Admitting: Family Medicine

## 2023-08-07 ENCOUNTER — Ambulatory Visit: Payer: Medicare (Managed Care) | Admitting: Family Medicine

## 2023-08-07 VITALS — BP 110/60 | HR 57 | Ht 71.0 in | Wt 247.6 lb

## 2023-08-07 DIAGNOSIS — M5416 Radiculopathy, lumbar region: Secondary | ICD-10-CM | POA: Diagnosis not present

## 2023-08-07 DIAGNOSIS — M48061 Spinal stenosis, lumbar region without neurogenic claudication: Secondary | ICD-10-CM | POA: Diagnosis not present

## 2023-08-07 NOTE — Patient Instructions (Addendum)
 [facility] [phone] Call Today  When we receive your results we will contact you. See me 6 weeks after epidural

## 2023-08-07 NOTE — Assessment & Plan Note (Signed)
 Do believe significant spinal stenosis noted at L4-L5.  Did respond well for 8 months of a epidural steroid injection.  Discussed with patient about icing regimen and home exercises.  Increase activity slowly.  Follow-up again in 6 to 8 weeks after the injection and we will do another epidural at the L4-L5 which patient has responded to previously.  Discussed can repeat safely every 3 to 4 months if necessary.  Patient states that with the first injection he got 75% better.  Total time reviewing patient's chart and discussing with patient about different treatment options including physical therapy and injections 31 minutes

## 2023-08-08 ENCOUNTER — Telehealth: Payer: Self-pay

## 2023-08-08 NOTE — Telephone Encounter (Signed)
 Left message on My Chart with Echo results per Dr. Vanetta Shawl note. Routed to PCP.

## 2023-08-08 NOTE — Telephone Encounter (Signed)
 Left message on My Chart with [place] results per Dr. Vanetta Shawl note. Routed to PCP.

## 2023-08-10 ENCOUNTER — Telehealth: Payer: Self-pay

## 2023-08-10 NOTE — Telephone Encounter (Signed)
 Pt viewed [place] results on My Chart per Dr. Vanetta Shawl note. Routed to PCP.

## 2023-08-15 NOTE — Discharge Instructions (Signed)

## 2023-08-16 ENCOUNTER — Ambulatory Visit
Admission: RE | Admit: 2023-08-16 | Discharge: 2023-08-16 | Disposition: A | Discharge status: Home / Self Care | Payer: Medicare (Managed Care) | Source: Ambulatory Visit | Attending: Family Medicine | Admitting: Family Medicine

## 2023-08-16 DIAGNOSIS — M5416 Radiculopathy, lumbar region: Secondary | ICD-10-CM

## 2023-08-16 MED ORDER — IOPAMIDOL (ISOVUE-M 200) INJECTION 41%
1.0000 mL | Freq: Once | INTRAMUSCULAR | Status: AC
  Administered 2023-08-16: 1 mL via EPIDURAL

## 2023-08-16 MED ORDER — METHYLPREDNISOLONE ACETATE 40 MG/ML INJ SUSP (RADIOLOG
80.0000 mg | Freq: Once | INTRAMUSCULAR | Status: AC
Start: 2023-08-16 — End: 2023-08-16
  Administered 2023-08-16: 80 mg via EPIDURAL

## 2023-08-24 ENCOUNTER — Telehealth: Payer: Self-pay

## 2023-08-24 NOTE — Telephone Encounter (Signed)
 Pt viewed Echo results on My Chart per Dr. Vanetta Shawl note. Routed to PCP.

## 2023-08-28 NOTE — Telephone Encounter (Signed)
 Copied from CRM [id number]. Topic: General - Other >> Aug 28, 2023 11:12 AM Revonda D wrote: Reason for CRM: Pt stated that he usually comes in for lab work before his follow up visits. Pt stated that he has an appt schedule on 7/25 for a 6 month fu and wants to know if he needs to come in for a lab visit before that appt or if he can get it done on the day of the appt. Pt would like a callback with an update on this concern.

## 2023-08-29 NOTE — Telephone Encounter (Signed)
 Called and informed patient that he would be fine to come in for lab work prior to appointment with PCP on 07/25

## 2023-08-30 ENCOUNTER — Ambulatory Visit: Payer: Medicare (Managed Care) | Admitting: Family Medicine

## 2023-08-30 ENCOUNTER — Other Ambulatory Visit (INDEPENDENT_AMBULATORY_CARE_PROVIDER_SITE_OTHER): Payer: Medicare (Managed Care)

## 2023-08-30 DIAGNOSIS — E1165 Type 2 diabetes mellitus with hyperglycemia: Secondary | ICD-10-CM | POA: Diagnosis not present

## 2023-08-30 LAB — BASIC METABOLIC PANEL WITH GFR
BUN: 22 mg/dL (ref 6–23)
CO2: 28 meq/L (ref 19–32)
Calcium: 9 mg/dL (ref 8.4–10.5)
Chloride: 102 meq/L (ref 96–112)
Creatinine, Ser: 1.06 mg/dL (ref 0.40–1.50)
GFR: 63.5 mL/min (ref >60.00)
Glucose, Bld: 110 mg/dL — ABNORMAL HIGH (ref 70–99)
Potassium: 4.5 meq/L (ref 3.5–5.1)
Sodium: 139 meq/L (ref 135–145)

## 2023-08-30 LAB — HEPATIC FUNCTION PANEL
ALT: 17 U/L (ref 0–53)
AST: 14 U/L (ref 0–37)
Albumin: 4.3 g/dL (ref 3.5–5.2)
Alkaline Phosphatase: 75 U/L (ref 39–117)
Bilirubin, Direct: 0.2 mg/dL (ref 0.0–0.3)
Total Bilirubin: 0.9 mg/dL (ref 0.2–1.2)
Total Protein: 7.2 g/dL (ref 6.0–8.3)

## 2023-08-30 LAB — LIPID PANEL
Cholesterol: 135 mg/dL (ref 0–200)
HDL: 56.1 mg/dL (ref >39.00)
LDL Cholesterol: 62 mg/dL (ref 0–99)
NonHDL: 79.2
Total CHOL/HDL Ratio: 2
Triglycerides: 88 mg/dL (ref 0.0–149.0)
VLDL: 17.6 mg/dL (ref 0.0–40.0)

## 2023-08-30 LAB — HEMOGLOBIN A1C: Hgb A1c MFr Bld: 6.7 % — ABNORMAL HIGH (ref 4.6–6.5)

## 2023-09-01 ENCOUNTER — Ambulatory Visit (INDEPENDENT_AMBULATORY_CARE_PROVIDER_SITE_OTHER): Payer: Medicare (Managed Care) | Admitting: Internal Medicine

## 2023-09-01 ENCOUNTER — Encounter: Payer: Self-pay | Admitting: Internal Medicine

## 2023-09-01 VITALS — BP 96/58 | HR 55 | Temp 97.9°F | Ht 71.0 in | Wt 246.0 lb

## 2023-09-01 DIAGNOSIS — E538 Deficiency of other specified B group vitamins: Secondary | ICD-10-CM

## 2023-09-01 DIAGNOSIS — I10 Essential (primary) hypertension: Secondary | ICD-10-CM

## 2023-09-01 DIAGNOSIS — Z7984 Long term (current) use of oral hypoglycemic drugs: Secondary | ICD-10-CM

## 2023-09-01 DIAGNOSIS — I872 Venous insufficiency (chronic) (peripheral): Secondary | ICD-10-CM

## 2023-09-01 DIAGNOSIS — E119 Type 2 diabetes mellitus without complications: Secondary | ICD-10-CM | POA: Diagnosis not present

## 2023-09-01 DIAGNOSIS — E559 Vitamin D deficiency, unspecified: Secondary | ICD-10-CM

## 2023-09-01 MED ORDER — CLOBETASOL PROPIONATE 0.05 % EX OINT: TOPICAL_OINTMENT | Freq: Two times a day (BID) | CUTANEOUS | 1 refills | Status: AC

## 2023-09-01 NOTE — Patient Instructions (Signed)
 Please continue all other medications as before, and refills have been done if requested - the cream  Please have the pharmacy call with any other refills you may need.  Please continue your efforts at being more active, low cholesterol diet, and weight control.  Please keep your appointments with your specialists as you may have planned  Please make an Appointment to return in 6 months, or sooner if needed, also with Lab Appointment for testing done 3-5 days before at the FIRST FLOOR Lab (so this is for TWO appointments - please see the scheduling desk as you leave)

## 2023-09-01 NOTE — Progress Notes (Signed)
 Chief Complaint: follow up HTN, HLD and DM, low b12, rash to legs       HPI:  [name] is a 89 y.o. male here overall doing ok, Pt denies chest pain, increased sob or doe, wheezing, orthopnea, PND, increased LE swelling, palpitations, dizziness or syncope.   Pt denies polydipsia, polyuria, or new focal neuro s/s.    Pt denies fever, wt loss, night sweats, loss of appetite, or other constitutional symptoms  Does have worsening rash to legs again, asks for topical steroid cream restart.         Wt Readings from Last 3 Encounters:  09/01/23 246 lb (111.6 kg)  08/07/23 247 lb 9.6 oz (112.3 kg)  07/06/23 248 lb (112.5 kg)   BP Readings from Last 3 Encounters:  09/01/23 (!) 96/58  08/16/23 (!) 166/69  08/07/23 110/60         Past Medical History:  Diagnosis Date   AAA (abdominal aortic aneurysm) (HCC)    MONITORED BY DR GERLEAN LOV JUNE 2014--  STABLE  AT 3.7   Abdominal aneurysm without mention of rupture 07/24/2012   Acute sinus infection 06/17/2009   Qualifier: Diagnosis of  By: Norleen MD, Lynwood LELON    Allergic rhinitis    Anxiety state 07/20/2007   Arthritis    Asymptomatic stenosis of left carotid artery without infarction    MILD DISEASE   Bilateral carotid artery stenosis    BPH (benign prostatic hypertrophy)    Coronary artery calcification seen on CT scan 09/05/2017   Diabetes mellitus type 2, noninsulin dependent (HCC) 07/20/2007   Dilated cardiomyopathy (HCC) 12/14/2018   Diverticulosis of colon    Hearing loss of both ears 10/23/2013   History of cardiomyopathy    IDIOPATHIC-- PER [facility] NOTE 2013 , CURRENTLY RESOLVED   History of kidney stones    HTN (hypertension) 01/14/2016   Hypercalcemia 03/15/2019   Hyperlipidemia    Hypertension    Hypertensive heart disease without CHF    Obesity (BMI 30-39.9)    Paresthesia of both feet 07/14/2016   Peripheral vascular disease (HCC)    Personal history of kidney stones    Phimosis    PHN (postherpetic neuralgia) 09/18/2019    Preop exam for internal medicine 05/10/2016   Preventative health care 01/14/2016   Rash 05/01/2015   Right carotid artery occlusion    CHRONIC   WITHOUT INFARTION   Rotator cuff arthropathy of both shoulders 04/10/2019   Spinal stenosis at L4-L5 level 06/08/2016   Stroke (HCC)     Right eye   Type 2 diabetes mellitus (HCC)    Wears hearing aid    Past Surgical History:  Procedure Laterality Date   CARDIOVASCULAR STRESS TEST  JUNE 2008  DR BLANCA   CATARACT EXTRACTION W/ INTRAOCULAR LENS IMPLANT Right    CIRCUMCISION N/A 08/30/2012   Procedure: CIRCUMCISION ADULT;  Surgeon: Norleen JINNY Seltzer, MD;  Location: [facility];  Service: Urology;  Laterality: N/A;   CYSTO/  RIGHT URETERAL STENT PLACEMENT  10-16-2003   EXTRACORPOREAL SHOCK WAVE LITHOTRIPSY Right 2005   EYE SURGERY     JOINT REPLACEMENT Bilateral 2008  2009   Knee   LUMBAR LAMINECTOMY/DECOMPRESSION MICRODISCECTOMY Bilateral 06/08/2016   Procedure: Bilateral Microlumbar decompression L4-L5 and L5-S1;  Surgeon: Reyes Billing, MD;  Location: [facility];  Service: Orthopedics;  Laterality: Bilateral;  Requests 2 hours   TOTAL KNEE ARTHROPLASTY Bilateral RIGHT 10-08-2007;   LEFT 09-30-2008   TRANSTHORACIC  ECHOCARDIOGRAM  10-21-2009   LVEF 50%    reports that he quit smoking about 41 years ago. His smoking use included cigarettes. He started smoking about 66 years ago. He has a 18.8 pack-year smoking history. He has never used smokeless tobacco. He reports that he does not drink alcohol and does not use drugs. family history includes ALS in his father; Asthma in his mother; COPD in his mother; Depression in his mother; Diabetes in his father; Heart disease in his mother; Hypertension in his mother; Kidney disease in his mother. No Known Allergies Current Outpatient Medications on File Prior to Visit  Medication Sig Dispense Refill   acyclovir  (ZOVIRAX ) 400 MG tablet Take 1 tablet (400 mg total) by mouth 3 (three) times daily. 15  tablet 0   amiodarone  (PACERONE ) 200 MG tablet Take 0.5 tablets (100 mg total) by mouth daily. Please keep scheduled appointment for future refills. Thank you. 15 tablet 0   atorvastatin  (LIPITOR) 80 MG tablet Take 1 tablet (80 mg total) by mouth daily. 90 tablet 3   beta carotene w/minerals (OCUVITE) tablet Take 1 tablet by mouth daily. Unknown strength     carvedilol  (COREG ) 12.5 MG tablet Take 12.5 mg by mouth 2 (two) times daily with a meal.     empagliflozin  (JARDIANCE ) 10 MG TABS tablet Take 10 mg by mouth daily.     ENTRESTO  24-26 MG TAKE ONE TABLET BY MOUTH TWICE DAILY 60 tablet 6   furosemide  (LASIX ) 40 MG tablet Take 0.5 tablets (20 mg total) by mouth daily. 45 tablet 2   glipiZIDE  (GLUCOTROL  XL) 10 MG 24 hr tablet Take 1 tablet (10 mg total) by mouth daily with breakfast. 90 tablet 3   glucose blood (FREESTYLE LITE) test strip Use as instructed once daily E11.9 (Patient taking differently: 1 each by Other route See admin instructions. Use as instructed once daily E11.9) 100 each 3   hydrochlorothiazide  (HYDRODIURIL ) 25 MG tablet Take 25 mg by mouth daily.     IBU 800 MG tablet Take 800 mg by mouth every 6 (six) hours as needed for mild pain or moderate pain.     ipratropium (ATROVENT) 0.06 % nasal spray Place 2 sprays into the nose 3 (three) times daily.     ketoconazole  (NIZORAL ) 2 % cream APPLY TOPICALLY TWICE DAILY AS NEEDED FOR IRRITATION (Patient taking differently: Apply 1 Application topically 2 (two) times daily as needed for irritation.) 60 g 1   levocetirizine (XYZAL) 5 MG tablet Take 5 mg by mouth every evening.     metFORMIN  (GLUCOPHAGE ) 500 MG tablet Take 1 tablet (500 mg total) by mouth 2 (two) times daily with a meal. 180 tablet 3   Multiple Vitamin (MULTIVITAMIN WITH MINERALS) TABS tablet Take 1 tablet by mouth daily. Unknown strength     naproxen (NAPROSYN) 250 MG tablet Take 250 mg by mouth.     SAW PALMETTO, SERENOA REPENS, PO Take 1 tablet by mouth daily.      sildenafil  (VIAGRA ) 100 MG tablet TAKE 1/2 TO 1 TABLET BY MOUTH DAILY AS NEEDED FOR ERECTILE DYSFUNCTION (Patient taking differently: Take 25-50 mg by mouth as needed for erectile dysfunction.) 5 tablet 11   tamsulosin  (FLOMAX ) 0.4 MG CAPS capsule Take 1 capsule (0.4 mg total) by mouth daily. 90 capsule 3   No current facility-administered medications on file prior to visit.        ROS:  All others reviewed and negative.  Objective        PE:  BP (!) 96/58   Pulse (!) 55   Temp 97.9 F (36.6 C)   Ht 5' 11 (1.803 m)   Wt 246 lb (111.6 kg)   SpO2 99%   BMI 34.31 kg/m                 Constitutional: Pt appears in NAD               HENT: Head: NCAT.                Right Ear: External ear normal.                 Left Ear: External ear normal.                Eyes: . Pupils are equal, round, and reactive to light. Conjunctivae and EOM are normal               Nose: without d/c or deformity               Neck: Neck supple. Gross normal ROM               Cardiovascular: Normal rate and regular rhythm.                 Pulmonary/Chest: Effort normal and breath sounds without rales or wheezing.                Abd:  Soft, NT, ND, + BS, no organomegaly               Neurological: Pt is alert. At baseline orientation, motor grossly intact               Skin: Skin is warm. Stasis dermatitis rash to legs below knees, no other new lesions, LE edema - none               Psychiatric: Pt behavior is normal without agitation   Micro: none  Cardiac tracings I have personally interpreted today:  none  Pertinent Radiological findings (summarize): none   Lab Results  Component Value Date   WBC 9.2 02/28/2023   HGB 13.8 02/28/2023   HCT 40.9 02/28/2023   PLT 134.0 (L) 02/28/2023   GLUCOSE 110 (H) 08/30/2023   CHOL 135 08/30/2023   TRIG 88.0 08/30/2023   HDL 56.10 08/30/2023   LDLDIRECT 92.0 02/06/2017   LDLCALC 62 08/30/2023   ALT 17 08/30/2023   AST 14 08/30/2023   NA 139 08/30/2023   K  4.5 08/30/2023   CL 102 08/30/2023   CREATININE 1.06 08/30/2023   BUN 22 08/30/2023   CO2 28 08/30/2023   TSH 2.27 02/28/2023   PSA 2.15 07/14/2016   INR 1.1 09/24/2008   HGBA1C 6.7 (H) 08/30/2023   MICROALBUR 0.4 08/21/2009   Assessment/Plan:  [name] is a 89 y.o. White or Caucasian [1] male with  has a past medical history of AAA (abdominal aortic aneurysm) (HCC), Abdominal aneurysm without mention of rupture (07/24/2012), Acute sinus infection (06/17/2009), Allergic rhinitis, Anxiety state (07/20/2007), Arthritis, Asymptomatic stenosis of left carotid artery without infarction, Bilateral carotid artery stenosis, BPH (benign prostatic hypertrophy), Coronary artery calcification seen on CT scan (09/05/2017), Diabetes mellitus type 2, noninsulin dependent (HCC) (07/20/2007), Dilated cardiomyopathy (HCC) (12/14/2018), Diverticulosis of colon, Hearing loss of both ears (10/23/2013), History of cardiomyopathy, History of kidney stones, HTN (hypertension) (01/14/2016), Hypercalcemia (03/15/2019), Hyperlipidemia, Hypertension, Hypertensive heart disease without CHF, Obesity (BMI 30-39.9), Paresthesia of both feet (07/14/2016), Peripheral  vascular disease (HCC), Personal history of kidney stones, Phimosis, PHN (postherpetic neuralgia) (09/18/2019), Preop exam for internal medicine (05/10/2016), Preventative health care (01/14/2016), Rash (05/01/2015), Right carotid artery occlusion, Rotator cuff arthropathy of both shoulders (04/10/2019), Spinal stenosis at L4-L5 level (06/08/2016), Stroke (HCC), Type 2 diabetes mellitus (HCC), and Wears hearing aid.  B12 deficiency Lab Results  Component Value Date   VITAMINB12 314 02/22/2022   Stable, cont oral replacement - b12 1000 mcg qd   Diabetes mellitus type 2, noninsulin dependent Lab Results  Component Value Date   HGBA1C 6.7 (H) 08/30/2023   Stable, pt to continue current medical treatment jardiance  10 every day, glipziide xl 10 every day, metformin  500  bid   HTN (hypertension) BP Readings from Last 3 Encounters:  09/01/23 (!) 96/58  08/16/23 (!) 166/69  08/07/23 110/60   Low normal, asympt, pt to continue medical treatment coreg  12.5 bid, entresto  24 - 26 qd   Stasis dermatitis of both legs Mild to mod, for topical steroid asd - temovate ,  to f/u any worsening symptoms or concerns  Vitamin D  deficiency Last vitamin D  Lab Results  Component Value Date   VD25OH 37.41 08/24/2022   Low, to start oral replacement  Followup: Return in about 6 months (around 03/03/2024).  Lynwood Rush, MD 09/02/2023 6:01 PM [facility]

## 2023-09-02 ENCOUNTER — Encounter: Payer: Self-pay | Admitting: Internal Medicine

## 2023-09-02 NOTE — Assessment & Plan Note (Signed)
 Mild to mod, for topical steroid asd - temovate ,  to f/u any worsening symptoms or concerns

## 2023-09-02 NOTE — Assessment & Plan Note (Signed)
 BP Readings from Last 3 Encounters:  09/01/23 (!) 96/58  08/16/23 (!) 166/69  08/07/23 110/60   Low normal, asympt, pt to continue medical treatment coreg  12.5 bid, entresto  24 - 26 qd

## 2023-09-02 NOTE — Assessment & Plan Note (Signed)
 Lab Results  Component Value Date   HGBA1C 6.7 (H) 08/30/2023   Stable, pt to continue current medical treatment jardiance  10 every day, glipziide xl 10 every day, metformin  500 bid

## 2023-09-02 NOTE — Assessment & Plan Note (Signed)
 Lab Results  Component Value Date   VITAMINB12 314 02/22/2022   Stable, cont oral replacement - b12 1000 mcg qd

## 2023-09-02 NOTE — Addendum Note (Signed)
 Addended by: NORLEEN LYNWOOD ORN on: 09/02/2023 06:03 PM   Modules accepted: Orders

## 2023-09-02 NOTE — Assessment & Plan Note (Signed)
 Last vitamin D Lab Results  Component Value Date   VD25OH 37.41 08/24/2022   Low, to start oral replacement

## 2023-09-04 NOTE — Telephone Encounter (Signed)
 Pt called, moved appt to 9am.

## 2023-09-05 ENCOUNTER — Ambulatory Visit: Payer: Medicare (Managed Care) | Admitting: Family Medicine

## 2023-09-05 VITALS — BP 112/80 | HR 48 | Ht 71.0 in | Wt 249.0 lb

## 2023-09-05 DIAGNOSIS — M48061 Spinal stenosis, lumbar region without neurogenic claudication: Secondary | ICD-10-CM | POA: Diagnosis not present

## 2023-09-05 DIAGNOSIS — M5459 Other low back pain: Secondary | ICD-10-CM | POA: Diagnosis not present

## 2023-09-05 NOTE — Progress Notes (Signed)
 [name] [address] [place] [address] Phone: [phone] Subjective:   [name], am serving as a scribe for Dr. Arthea Claudene.  I'm seeing this patient by the request  of:  Norleen Lynwood ORN, MD  CC: Low back pain follow-up  Joshua  YUTA [name] is a 89 y.o. male coming in with complaint of low back pain.  Did have an epidural August 16, 2023 for the lumbar spine. Patient states that he has not noticed any improvement since having epidural. Walking and bending over increases his pain.     Patient has previous MRI of the lumbar spine showed fairly significant arthritic changes noted with compression mostly on the right L5 nerve root. Past Medical History:  Diagnosis Date   AAA (abdominal aortic aneurysm) (HCC)    MONITORED BY DR LAWSON LOV JUNE 2014--  STABLE  AT 3.7   Abdominal aneurysm without mention of rupture 07/24/2012   Acute sinus infection 06/17/2009   Qualifier: Diagnosis of  By: Norleen MD, Lynwood ORN    Allergic rhinitis    Anxiety state 07/20/2007   Arthritis    Asymptomatic stenosis of left carotid artery without infarction    MILD DISEASE   Bilateral carotid artery stenosis    BPH (benign prostatic hypertrophy)    Coronary artery calcification seen on CT scan 09/05/2017   Diabetes mellitus type 2, noninsulin dependent (HCC) 07/20/2007   Dilated cardiomyopathy (HCC) 12/14/2018   Diverticulosis of colon    Hearing loss of both ears 10/23/2013   History of cardiomyopathy    IDIOPATHIC-- PER [facility] NOTE 2013 , CURRENTLY RESOLVED   History of kidney stones    HTN (hypertension) 01/14/2016   Hypercalcemia 03/15/2019   Hyperlipidemia    Hypertension    Hypertensive heart disease without CHF    Obesity (BMI 30-39.9)    Paresthesia of both feet 07/14/2016   Peripheral vascular disease (HCC)    Personal history of kidney stones    Phimosis    PHN (postherpetic neuralgia) 09/18/2019   Preop exam for internal medicine 05/10/2016    Preventative health care 01/14/2016   Rash 05/01/2015   Right carotid artery occlusion    CHRONIC   WITHOUT INFARTION   Rotator cuff arthropathy of both shoulders 04/10/2019   Spinal stenosis at L4-L5 level 06/08/2016   Stroke (HCC)     Right eye   Type 2 diabetes mellitus (HCC)    Wears hearing aid    Past Surgical History:  Procedure Laterality Date   CARDIOVASCULAR STRESS TEST  JUNE 2008  DR BLANCA   CATARACT EXTRACTION W/ INTRAOCULAR LENS IMPLANT Right    CIRCUMCISION N/A 08/30/2012   Procedure: CIRCUMCISION ADULT;  Surgeon: Norleen JINNY Seltzer, MD;  Location: [facility];  Service: Urology;  Laterality: N/A;   CYSTO/  RIGHT URETERAL STENT PLACEMENT  10-16-2003   EXTRACORPOREAL SHOCK WAVE LITHOTRIPSY Right 2005   EYE SURGERY     JOINT REPLACEMENT Bilateral 2008  2009   Knee   LUMBAR LAMINECTOMY/DECOMPRESSION MICRODISCECTOMY Bilateral 06/08/2016   Procedure: Bilateral Microlumbar decompression L4-L5 and L5-S1;  Surgeon: Reyes Billing, MD;  Location: [facility];  Service: Orthopedics;  Laterality: Bilateral;  Requests 2 hours   TOTAL KNEE ARTHROPLASTY Bilateral RIGHT 10-08-2007;   LEFT 09-30-2008   TRANSTHORACIC ECHOCARDIOGRAM  10-21-2009   LVEF 50%   Social History   Socioeconomic History   Marital status: Widowed    Spouse name: Not on file   Number  of children: Not on file   Years of education: Not on file   Highest education level: Master's degree (e.g., MA, MS, MEng, MEd, MSW, MBA)  Occupational History   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  Tobacco Use   Smoking status: Former    Current packs/day: 0.00    Average packs/day: 0.8 packs/day for 25.0 years (18.8 ttl pk-yrs)    Types: Cigarettes    Start date: 07/24/1957    Quit date: 07/25/1982    Years since quitting: 41.1   Smokeless tobacco: Never  Vaping Use   Vaping status: Never Used  Substance and Sexual Activity   Alcohol use: No     Alcohol/week: 0.0 standard drinks of alcohol    Comment: recovering alcoholic  +20yrs   Drug use: No   Sexual activity: Not on file  Other Topics Concern   Not on file  Social History Narrative   Lives alone/2025   Social Drivers of Health   Financial Resource Strain: Low Risk  (08/28/2023)   Overall Financial Resource Strain (CARDIA)    Difficulty of Paying Living Expenses: Not hard at all  Food Insecurity: No Food Insecurity (08/28/2023)   Hunger Vital Sign    Worried About Running Out of Food in the Last Year: Never true    Ran Out of Food in the Last Year: Never true  Transportation Needs: No Transportation Needs (08/28/2023)   PRAPARE - Administrator, Civil Service (Medical): No    Lack of Transportation (Non-Medical): No  Physical Activity: Inactive (08/28/2023)   Exercise Vital Sign    Days of Exercise per Week: 0 days    Minutes of Exercise per Session: Not on file  Stress: No Stress Concern Present (08/28/2023)   [employer] of Occupational Health - Occupational Stress Questionnaire    Feeling of Stress: Not at all  Social Connections: Unknown (08/28/2023)   Social Connection and Isolation Panel    Frequency of Communication with Friends and Family: Three times a week    Frequency of Social Gatherings with Friends and Family: Three times a week    Attends Religious Services: Never    Active Member of Clubs or Organizations: Not on file    Attends Banker Meetings: Not on file    Marital Status: Widowed  Recent Concern: Social Connections - Socially Isolated (07/06/2023)   Social Connection and Isolation Panel    Frequency of Communication with Friends and Family: Twice a week    Frequency of Social Gatherings with Friends and Family: Once a week    Attends Religious Services: Never    Database administrator or Organizations: No    Attends Banker Meetings: Never    Marital Status: Widowed   No Known Allergies Family History   Problem Relation Age of Onset   Heart disease Mother        CHF  Before age 70   COPD Mother    Kidney disease Mother    Hypertension Mother    Asthma Mother    Depression Mother    ALS Father    Diabetes Father    Colon cancer Neg Hx     Current Outpatient Medications (Endocrine & Metabolic):    empagliflozin  (JARDIANCE ) 10 MG TABS tablet, Take 10 mg by mouth daily.   glipiZIDE  (GLUCOTROL  XL) 10 MG 24 hr tablet, Take 1 tablet (10 mg total) by  mouth daily with breakfast.   metFORMIN  (GLUCOPHAGE ) 500 MG tablet, Take 1 tablet (500 mg total) by mouth 2 (two) times daily with a meal.  Current Outpatient Medications (Cardiovascular):    amiodarone  (PACERONE ) 200 MG tablet, Take 0.5 tablets (100 mg total) by mouth daily. Please keep scheduled appointment for future refills. Thank you.   atorvastatin  (LIPITOR) 80 MG tablet, Take 1 tablet (80 mg total) by mouth daily.   carvedilol  (COREG ) 12.5 MG tablet, Take 12.5 mg by mouth 2 (two) times daily with a meal.   ENTRESTO  24-26 MG, TAKE ONE TABLET BY MOUTH TWICE DAILY   furosemide  (LASIX ) 40 MG tablet, Take 0.5 tablets (20 mg total) by mouth daily.   hydrochlorothiazide  (HYDRODIURIL ) 25 MG tablet, Take 25 mg by mouth daily.   sildenafil  (VIAGRA ) 100 MG tablet, TAKE 1/2 TO 1 TABLET BY MOUTH DAILY AS NEEDED FOR ERECTILE DYSFUNCTION (Patient taking differently: Take 25-50 mg by mouth as needed for erectile dysfunction.)  Current Outpatient Medications (Respiratory):    ipratropium (ATROVENT) 0.06 % nasal spray, Place 2 sprays into the nose 3 (three) times daily.   levocetirizine (XYZAL) 5 MG tablet, Take 5 mg by mouth every evening.  Current Outpatient Medications (Analgesics):    IBU 800 MG tablet, Take 800 mg by mouth every 6 (six) hours as needed for mild pain or moderate pain.   naproxen (NAPROSYN) 250 MG tablet, Take 250 mg by mouth.   Current Outpatient Medications (Other):    acyclovir  (ZOVIRAX ) 400 MG tablet, Take 1 tablet (400 mg  total) by mouth 3 (three) times daily.   beta carotene w/minerals (OCUVITE) tablet, Take 1 tablet by mouth daily. Unknown strength   clobetasol  ointment (TEMOVATE ) 0.05 %, Apply topically 2 (two) times daily.   glucose blood (FREESTYLE LITE) test strip, Use as instructed once daily E11.9 (Patient taking differently: 1 each by Other route See admin instructions. Use as instructed once daily E11.9)   ketoconazole  (NIZORAL ) 2 % cream, APPLY TOPICALLY TWICE DAILY AS NEEDED FOR IRRITATION (Patient taking differently: Apply 1 Application topically 2 (two) times daily as needed for irritation.)   Multiple Vitamin (MULTIVITAMIN WITH MINERALS) TABS tablet, Take 1 tablet by mouth daily. Unknown strength   SAW PALMETTO, SERENOA REPENS, PO, Take 1 tablet by mouth daily.   tamsulosin  (FLOMAX ) 0.4 MG CAPS capsule, Take 1 capsule (0.4 mg total) by mouth daily.   Reviewed prior external information including notes and imaging from  primary care provider As well as notes that were available from care everywhere and other healthcare systems.  Past medical history, social, surgical and family history all reviewed in electronic medical record.  No pertanent information unless stated regarding to the chief complaint.   Review of Systems:  No headache, visual changes, nausea, vomiting, diarrhea, constipation, dizziness, abdominal pain, skin rash, fevers, chills, night sweats, weight loss, swollen lymph nodes, body aches, joint swelling, chest pain, shortness of breath, mood changes. POSITIVE muscle aches  Objective  Blood pressure 112/80, pulse (!) 48, height 5' 11 (1.803 m), weight 249 lb (112.9 kg), SpO2 100%.   General: No apparent distress alert and oriented x3 mood and affect normal, dressed appropriately.  HEENT: Pupils equal, extraocular movements intact mild hard of hearing with cochlear implant Respiratory: Patient's speak in full sentences and does not appear short of breath  Cardiovascular: No lower  extremity edema, non tender, no erythema   Low back exam shows tender to palpation noted.  Some loss of lordosis noted.  Tension in the  back.  No worsening pain with it. Tightness noted with Deri right greater than left.   Impression and Recommendations:    The above documentation has been reviewed and is accurate and complete Triana Coover M Analyssa Downs, DO

## 2023-09-05 NOTE — Patient Instructions (Signed)
 L4/L5 facet bilateral [phone] See me 6-8 weeks after injection Other choices to read about  Medial branch block and radio frequency ablation

## 2023-09-05 NOTE — Assessment & Plan Note (Signed)
 Known spinal stenosis but did not respond extremely well to the epidural.  Seems to be more localized and worsening pain with extension.  Does have some facet arthropathy in the area as well and we will try some facet injections to see how he responds.  Discussed different things such as medial branch block as well.  Follow-up again in 6 to 8 weeks after the injection.  Total time with patient 31 minutes

## 2023-09-13 NOTE — Discharge Instructions (Signed)

## 2023-09-15 ENCOUNTER — Ambulatory Visit
Admission: RE | Admit: 2023-09-15 | Discharge: 2023-09-15 | Disposition: A | Discharge status: Home / Self Care | Payer: Medicare (Managed Care) | Source: Ambulatory Visit | Attending: Family Medicine | Admitting: Family Medicine

## 2023-09-15 DIAGNOSIS — M5459 Other low back pain: Secondary | ICD-10-CM

## 2023-09-15 MED ORDER — METHYLPREDNISOLONE ACETATE 40 MG/ML INJ SUSP (RADIOLOG
80.0000 mg | Freq: Once | INTRAMUSCULAR | Status: AC
  Administered 2023-09-15: 80 mg via INTRA_ARTICULAR

## 2023-09-15 MED ORDER — IOPAMIDOL (ISOVUE-M 200) INJECTION 41%
1.0000 mL | Freq: Once | INTRAMUSCULAR | Status: AC
  Administered 2023-09-15: 1 mL via INTRA_ARTICULAR

## 2023-10-30 ENCOUNTER — Other Ambulatory Visit: Payer: Self-pay | Admitting: Internal Medicine

## 2023-10-31 NOTE — Progress Notes (Unsigned)
 [name] [address] [place] [address] Phone: [phone] Subjective:   [name], am serving as a scribe for Dr. Arthea [name].  I'm seeing this patient by the request  of:  [name] ORN, MD  CC: Back pain  YEP:Dlagzrupcz  09/05/2023 Known spinal stenosis but did not respond extremely well to the epidural.  Seems to be more localized and worsening pain with extension.  Does have some facet arthropathy in the area as well and we will try some facet injections to see how he responds.  Discussed different things such as medial branch block as well.  Follow-up again in 6 to 8 weeks after the injection.  Total time with patient 31 minutes     Updated 11/01/2023 [name] is a 89 y.o. male coming in with complaint of back pain. Facet injections 8/8. Injection didn't help. Blood pressure has been going low which causes unsteadiness.   Patient's previous MRI  IMPRESSION: 1. L3-4: Mild to moderate bulging of the disc. Facet and ligamentoushypertrophy. Mild canal narrowing but no apparent neuralcompression. 2. L4-5: Endplate osteophytes and shallow protrusion of the discmore prominent in the right posterolateral direction. Facet andligamentous hypertrophy, worse on the right. Stenosis of the rightlateral recess that could compress the right L5 nerve. 3. L5-S1: Endplate osteophytes and bulging of the disc worse on theright. Facet degeneration worse on the right. Narrowing of bothsubarticular lateral recesses right more than left. Right foraminalstenosis. Neural compression could occur at this level on the right. 4. Facet osteoarthritis in the lower lumbar region that could be acause of back pain or referred facet syndrome pain.  Past Medical History:  Diagnosis Date   AAA (abdominal aortic aneurysm)    MONITORED BY DR GERLEAN LOV JUNE 2014--  STABLE  AT 3.7   Abdominal aneurysm without mention of rupture 07/24/2012   Acute sinus infection  06/17/2009   Qualifier: Diagnosis of  By: Norleen MD, [name] ORN    Allergic rhinitis    Anxiety state 07/20/2007   Arthritis    Asymptomatic stenosis of left carotid artery without infarction    MILD DISEASE   Bilateral carotid artery stenosis    BPH (benign prostatic hypertrophy)    Coronary artery calcification seen on CT scan 09/05/2017   Diabetes mellitus type 2, noninsulin dependent (HCC) 07/20/2007   Dilated cardiomyopathy (HCC) 12/14/2018   Diverticulosis of colon    Hearing loss of both ears 10/23/2013   History of cardiomyopathy    IDIOPATHIC-- PER [facility] NOTE 2013 , CURRENTLY RESOLVED   History of kidney stones    HTN (hypertension) 01/14/2016   Hypercalcemia 03/15/2019   Hyperlipidemia    Hypertension    Hypertensive heart disease without CHF    Obesity (BMI 30-39.9)    Paresthesia of both feet 07/14/2016   Peripheral vascular disease    Personal history of kidney stones    Phimosis    PHN (postherpetic neuralgia) 09/18/2019   Preop exam for internal medicine 05/10/2016   Preventative health care 01/14/2016   Rash 05/01/2015   Right carotid artery occlusion    CHRONIC   WITHOUT INFARTION   Rotator cuff arthropathy of both shoulders 04/10/2019   Spinal stenosis at L4-L5 level 06/08/2016   Stroke (HCC)     Right eye   Type 2 diabetes mellitus (HCC)    Wears hearing aid    Past Surgical History:  Procedure Laterality Date   CARDIOVASCULAR STRESS TEST  JUNE 2008  DR  TILLEY   CATARACT EXTRACTION W/ INTRAOCULAR LENS IMPLANT Right    CIRCUMCISION N/A 08/30/2012   Procedure: CIRCUMCISION ADULT;  Surgeon: Joshua JINNY Seltzer, MD;  Location: [facility];  Service: Urology;  Laterality: N/A;   CYSTO/  RIGHT URETERAL STENT PLACEMENT  10-16-2003   EXTRACORPOREAL SHOCK WAVE LITHOTRIPSY Right 2005   EYE SURGERY     JOINT REPLACEMENT Bilateral 2008  2009   Knee   LUMBAR LAMINECTOMY/DECOMPRESSION MICRODISCECTOMY Bilateral 06/08/2016   Procedure: Bilateral Microlumbar decompression L4-L5 and  L5-S1;  Surgeon: Reyes Billing, MD;  Location: [facility];  Service: Orthopedics;  Laterality: Bilateral;  Requests 2 hours   TOTAL KNEE ARTHROPLASTY Bilateral RIGHT 10-08-2007;   LEFT 09-30-2008   TRANSTHORACIC ECHOCARDIOGRAM  10-21-2009   LVEF 50%   Social History   Socioeconomic History   Marital status: Widowed    Spouse name: Not on file   Number of children: Not on file   Years of education: Not on file   Highest education level: Master's degree (e.g., MA, MS, MEng, MEd, MSW, MBA)  Occupational History   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  Tobacco Use   Smoking status: Former    Current packs/day: 0.00    Average packs/day: 0.8 packs/day for 25.0 years (18.8 ttl pk-yrs)    Types: Cigarettes    Start date: 07/24/1957    Quit date: 07/25/1982    Years since quitting: 41.2   Smokeless tobacco: Never  Vaping Use   Vaping status: Never Used  Substance and Sexual Activity   Alcohol use: No    Alcohol/week: 0.0 standard drinks of alcohol    Comment: recovering alcoholic  +12yrs   Drug use: No   Sexual activity: Not on file  Other Topics Concern   Not on file  Social History Narrative   Lives alone/2025   Social Drivers of Health   Financial Resource Strain: Low Risk  (08/28/2023)   Overall Financial Resource Strain (CARDIA)    Difficulty of Paying Living Expenses: Not hard at all  Food Insecurity: No Food Insecurity (08/28/2023)   Hunger Vital Sign    Worried About Running Out of Food in the Last Year: Never true    Ran Out of Food in the Last Year: Never true  Transportation Needs: No Transportation Needs (08/28/2023)   PRAPARE - Administrator, Civil Service (Medical): No    Lack of Transportation (Non-Medical): No  Physical Activity: Inactive (08/28/2023)   Exercise Vital Sign    Days of Exercise per Week: 0 days    Minutes of Exercise per Session: Not on file  Stress: No Stress Concern  Present (08/28/2023)   [employer] of Occupational Health - Occupational Stress Questionnaire    Feeling of Stress: Not at all  Social Connections: Unknown (08/28/2023)   Social Connection and Isolation Panel    Frequency of Communication with Friends and Family: Three times a week    Frequency of Social Gatherings with Friends and Family: Three times a week    Attends Religious Services: Never    Active Member of Clubs or Organizations: Not on file    Attends Banker Meetings: Not on file    Marital Status: Widowed  Recent Concern: Social Connections - Socially Isolated (07/06/2023)   Social Connection and Isolation Panel    Frequency of Communication with Friends and Family: Twice a week    Frequency of  Social Gatherings with Friends and Family: Once a week    Attends Religious Services: Never    Database administrator or Organizations: No    Attends Banker Meetings: Never    Marital Status: Widowed   No Known Allergies Family History  Problem Relation Age of Onset   Heart disease Mother        CHF  Before age 89   COPD Mother    Kidney disease Mother    Hypertension Mother    Asthma Mother    Depression Mother    ALS Father    Diabetes Father    Colon cancer Neg Hx     Current Outpatient Medications (Endocrine & Metabolic):    empagliflozin  (JARDIANCE ) 10 MG TABS tablet, Take 10 mg by mouth daily.   glipiZIDE  (GLUCOTROL  XL) 10 MG 24 hr tablet, Take 1 tablet (10 mg total) by mouth daily with breakfast.   metFORMIN  (GLUCOPHAGE ) 500 MG tablet, Take 1 tablet (500 mg total) by mouth 2 (two) times daily with a meal.  Current Outpatient Medications (Cardiovascular):    amiodarone  (PACERONE ) 200 MG tablet, Take 0.5 tablets (100 mg total) by mouth daily. Please keep scheduled appointment for future refills. Thank you.   atorvastatin  (LIPITOR) 80 MG tablet, Take 1 tablet (80 mg total) by mouth daily.   carvedilol  (COREG ) 12.5 MG tablet, Take 12.5 mg  by mouth 2 (two) times daily with a meal.   ENTRESTO  24-26 MG, TAKE ONE TABLET BY MOUTH TWICE DAILY   furosemide  (LASIX ) 40 MG tablet, Take 0.5 tablets (20 mg total) by mouth daily.   hydrochlorothiazide  (HYDRODIURIL ) 25 MG tablet, Take 25 mg by mouth daily.   sildenafil  (VIAGRA ) 100 MG tablet, TAKE 1/2 TO 1 TABLET BY MOUTH DAILY AS NEEDED FOR ERECTILE DYSFUNCTION  Current Outpatient Medications (Respiratory):    ipratropium (ATROVENT) 0.06 % nasal spray, Place 2 sprays into the nose 3 (three) times daily.   levocetirizine (XYZAL) 5 MG tablet, Take 5 mg by mouth every evening.  Current Outpatient Medications (Analgesics):    IBU 800 MG tablet, Take 800 mg by mouth every 6 (six) hours as needed for mild pain or moderate pain.   naproxen (NAPROSYN) 250 MG tablet, Take 250 mg by mouth.   Current Outpatient Medications (Other):    acyclovir  (ZOVIRAX ) 400 MG tablet, Take 1 tablet (400 mg total) by mouth 3 (three) times daily.   beta carotene w/minerals (OCUVITE) tablet, Take 1 tablet by mouth daily. Unknown strength   clobetasol  ointment (TEMOVATE ) 0.05 %, Apply topically 2 (two) times daily.   glucose blood (FREESTYLE LITE) test strip, Use as instructed once daily E11.9 (Patient taking differently: 1 each by Other route See admin instructions. Use as instructed once daily E11.9)   ketoconazole  (NIZORAL ) 2 % cream, APPLY TOPICALLY TWICE DAILY AS NEEDED FOR IRRITATION (Patient taking differently: Apply 1 Application topically 2 (two) times daily as needed for irritation.)   Multiple Vitamin (MULTIVITAMIN WITH MINERALS) TABS tablet, Take 1 tablet by mouth daily. Unknown strength   SAW PALMETTO, SERENOA REPENS, PO, Take 1 tablet by mouth daily.   tamsulosin  (FLOMAX ) 0.4 MG CAPS capsule, Take 1 capsule (0.4 mg total) by mouth daily.   Reviewed prior external information including notes and imaging from  primary care provider As well as notes that were available from care everywhere and other  healthcare systems.  Past medical history, social, surgical and family history all reviewed in electronic medical record.  No pertanent information unless  stated regarding to the chief complaint.   Review of Systems:  No headache, visual changes, nausea, vomiting, diarrhea, constipation, dizziness, abdominal pain, skin rash, fevers, chills, night sweats, weight loss, swollen lymph nodes, body aches, joint swelling, chest pain, shortness of breath, mood changes. POSITIVE muscle aches  Objective  Blood pressure 108/60, pulse 62, height 5' 11 (1.803 m), weight 251 lb (113.9 kg), SpO2 98%.   General: No apparent distress alert and oriented x3 mood and affect normal, dressed appropriately.  HEENT: Pupils equal, extraocular movements intact  Respiratory: Patient's speak in full sentences and does not appear short of breath  Cardiovascular: No lower extremity edema, non tender, no erythema  Low back does have some loss of lordosis.  Does have small amount of edema of the lower extremities.  Tightness with straight leg test.  Difficulty with range of motion in the hips bilaterally.  Mild shuffling gait noted.    Impression and Recommendations:    The above documentation has been reviewed and is accurate and complete Keidra Withers M Gurley Climer, DO

## 2023-11-01 ENCOUNTER — Encounter: Payer: Self-pay | Admitting: Family Medicine

## 2023-11-01 ENCOUNTER — Ambulatory Visit: Payer: Medicare (Managed Care) | Admitting: Family Medicine

## 2023-11-01 VITALS — BP 108/60 | HR 62 | Ht 71.0 in | Wt 251.0 lb

## 2023-11-01 DIAGNOSIS — I119 Hypertensive heart disease without heart failure: Secondary | ICD-10-CM | POA: Diagnosis not present

## 2023-11-01 DIAGNOSIS — M48061 Spinal stenosis, lumbar region without neurogenic claudication: Secondary | ICD-10-CM

## 2023-11-01 DIAGNOSIS — M5416 Radiculopathy, lumbar region: Secondary | ICD-10-CM | POA: Diagnosis not present

## 2023-11-01 DIAGNOSIS — I872 Venous insufficiency (chronic) (peripheral): Secondary | ICD-10-CM | POA: Diagnosis not present

## 2023-11-01 NOTE — Assessment & Plan Note (Signed)
 Patient does have spinal stenosis noted.  Discussed icing regimen and home exercises, discussed which activities to do in which ones to avoid. Patient has not responded extremely well to the facet injections, has not responded well to the epidurals either.

## 2023-11-01 NOTE — Assessment & Plan Note (Signed)
 Usually has hypertension but unfortunately he does follow-up more hypotension.  Patient has been taking his Lasix  as well as hydrochlorothiazide  regularly.  Discussed with patient about discontinuing the Lasix  and weighing himself and when he gains 2 to 3 pounds to take half dose that day.  Patient is to follow-up with primary care and cardiology.  Patient knows of worsening low blood pressure to seek medical attention.  Also discussed holding the hydrochlorothiazide  for the next 3 days.

## 2023-11-01 NOTE — Patient Instructions (Addendum)
 Ref Dr. Darlis Difference between Medial branch block and RFA vs spinal stimulator Stop lasix  with half HZTC because of low blood pressure Check with blood pressure with cardiologist

## 2023-11-07 ENCOUNTER — Other Ambulatory Visit: Payer: Self-pay | Admitting: Internal Medicine

## 2023-11-27 ENCOUNTER — Other Ambulatory Visit: Payer: Self-pay | Admitting: Cardiology

## 2024-02-13 ENCOUNTER — Telehealth: Payer: Self-pay | Admitting: Family Medicine

## 2024-02-13 NOTE — Telephone Encounter (Signed)
 Patient called asking if Dr Claudene would be able to refer him to Leita Fireman at [facility] [address] [place] for low back pain. He is currently scheduled for this Friday but was told if he has a referral from Dr Claudene. It may help insurance pay for it.

## 2024-02-14 ENCOUNTER — Other Ambulatory Visit: Payer: Self-pay

## 2024-02-14 DIAGNOSIS — M545 Low back pain, unspecified: Secondary | ICD-10-CM

## 2024-02-14 DIAGNOSIS — M5440 Lumbago with sciatica, unspecified side: Secondary | ICD-10-CM

## 2024-02-27 ENCOUNTER — Ambulatory Visit: Payer: Medicare (Managed Care) | Admitting: Internal Medicine

## 2024-02-28 ENCOUNTER — Other Ambulatory Visit: Payer: Medicare (Managed Care)

## 2024-02-28 DIAGNOSIS — E119 Type 2 diabetes mellitus without complications: Secondary | ICD-10-CM

## 2024-02-28 DIAGNOSIS — E559 Vitamin D deficiency, unspecified: Secondary | ICD-10-CM | POA: Diagnosis not present

## 2024-02-28 LAB — CBC WITH DIFFERENTIAL/PLATELET
Basophils Absolute: 0 K/uL (ref 0.0–0.1)
Basophils Relative: 0.5 % (ref 0.0–3.0)
Eosinophils Absolute: 0.4 K/uL (ref 0.0–0.7)
Eosinophils Relative: 5.5 % — ABNORMAL HIGH (ref 0.0–5.0)
HCT: 40.2 % (ref 39.0–52.0)
Hemoglobin: 13.6 g/dL (ref 13.0–17.0)
Lymphocytes Relative: 19.8 % (ref 12.0–46.0)
Lymphs Abs: 1.4 K/uL (ref 0.7–4.0)
MCHC: 33.9 g/dL (ref 30.0–36.0)
MCV: 94.5 fl (ref 78.0–100.0)
Monocytes Absolute: 0.3 K/uL (ref 0.1–1.0)
Monocytes Relative: 4.8 % (ref 3.0–12.0)
Neutro Abs: 4.9 K/uL (ref 1.4–7.7)
Neutrophils Relative %: 69.4 % (ref 43.0–77.0)
Platelets: 141 K/uL — ABNORMAL LOW (ref 150.0–400.0)
RBC: 4.26 Mil/uL (ref 4.22–5.81)
RDW: 14 % (ref 11.5–15.5)
WBC: 7.1 K/uL (ref 4.0–10.5)

## 2024-02-28 LAB — HEPATIC FUNCTION PANEL
ALT: 15 U/L (ref 3–53)
AST: 20 U/L (ref 5–37)
Albumin: 4.2 g/dL (ref 3.5–5.2)
Alkaline Phosphatase: 54 U/L (ref 39–117)
Bilirubin, Direct: 0.1 mg/dL (ref 0.1–0.3)
Total Bilirubin: 0.7 mg/dL (ref 0.2–1.2)
Total Protein: 7 g/dL (ref 6.0–8.3)

## 2024-02-28 LAB — URINALYSIS, ROUTINE W REFLEX MICROSCOPIC
Hgb urine dipstick: NEGATIVE
Ketones, ur: >=80 — AB
Leukocytes,Ua: NEGATIVE
Nitrite: NEGATIVE
RBC / HPF: NONE SEEN
Specific Gravity, Urine: 1.025 (ref 1.000–1.030)
Urine Glucose: NEGATIVE
Urobilinogen, UA: 1 (ref 0.0–1.0)
pH: 6 (ref 5.0–8.0)

## 2024-02-28 LAB — BASIC METABOLIC PANEL WITH GFR
BUN: 20 mg/dL (ref 6–23)
CO2: 25 meq/L (ref 19–32)
Calcium: 9.1 mg/dL (ref 8.4–10.5)
Chloride: 103 meq/L (ref 96–112)
Creatinine, Ser: 1.23 mg/dL (ref 0.40–1.50)
GFR: 52.93 mL/min — ABNORMAL LOW
Glucose, Bld: 96 mg/dL (ref 70–99)
Potassium: 3.7 meq/L (ref 3.5–5.1)
Sodium: 138 meq/L (ref 135–145)

## 2024-02-28 LAB — VITAMIN D 25 HYDROXY (VIT D DEFICIENCY, FRACTURES): VITD: 38.75 ng/mL (ref 30.00–100.00)

## 2024-02-28 LAB — LIPID PANEL
Cholesterol: 213 mg/dL — ABNORMAL HIGH (ref 28–200)
HDL: 48 mg/dL
LDL Cholesterol: 151 mg/dL — ABNORMAL HIGH (ref 10–99)
NonHDL: 165.37
Total CHOL/HDL Ratio: 4
Triglycerides: 71 mg/dL (ref 10.0–149.0)
VLDL: 14.2 mg/dL (ref 0.0–40.0)

## 2024-02-28 LAB — HEMOGLOBIN A1C: Hgb A1c MFr Bld: 5.8 % (ref 4.6–6.5)

## 2024-02-28 LAB — MICROALBUMIN / CREATININE URINE RATIO
Creatinine,U: 179.9 mg/dL
Microalb Creat Ratio: 17.1 mg/g (ref 0.0–30.0)
Microalb, Ur: 3.1 mg/dL — ABNORMAL HIGH (ref 0.7–1.9)

## 2024-02-28 LAB — TSH: TSH: 2.03 u[IU]/mL (ref 0.35–5.50)

## 2024-03-01 ENCOUNTER — Other Ambulatory Visit: Payer: Self-pay | Admitting: Internal Medicine

## 2024-03-04 ENCOUNTER — Telehealth: Payer: Self-pay

## 2024-03-04 ENCOUNTER — Ambulatory Visit: Payer: Medicare (Managed Care) | Admitting: Internal Medicine

## 2024-03-04 NOTE — Telephone Encounter (Signed)
 Called & left voicemail for Pt to give the office a call back to reschedule appointment.

## 2024-03-05 ENCOUNTER — Ambulatory Visit: Payer: Medicare (Managed Care) | Admitting: Internal Medicine

## 2024-03-11 ENCOUNTER — Other Ambulatory Visit: Payer: Self-pay | Admitting: Internal Medicine

## 2024-03-12 ENCOUNTER — Other Ambulatory Visit: Payer: Self-pay

## 2024-07-08 ENCOUNTER — Ambulatory Visit: Payer: Medicare (Managed Care)

## 2024-07-23 ENCOUNTER — Encounter: Payer: Medicare (Managed Care) | Admitting: Family Medicine
